# Patient Record
Sex: Male | Born: 1980
Health system: Southern US, Community
[De-identification: ages and names within clinical notes are randomized; demographics above are authoritative.]

## PROBLEM LIST (undated history)

## (undated) ENCOUNTER — Ambulatory Visit (HOSPITAL_COMMUNITY): Admission: EM | Source: Home / Self Care

## (undated) DIAGNOSIS — E119 Type 2 diabetes mellitus without complications: Secondary | ICD-10-CM

## (undated) DIAGNOSIS — E7800 Pure hypercholesterolemia, unspecified: Secondary | ICD-10-CM

## (undated) DIAGNOSIS — E669 Obesity, unspecified: Secondary | ICD-10-CM

## (undated) HISTORY — DX: Type 2 diabetes mellitus without complications: E11.9

## (undated) HISTORY — DX: Obesity, unspecified: E66.9

---
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
## 2006-02-26 ENCOUNTER — Emergency Department (HOSPITAL_COMMUNITY): Admission: EM | Admit: 2006-02-26 | Discharge: 2006-02-27 | Payer: Self-pay | Admitting: Emergency Medicine

## 2006-07-28 ENCOUNTER — Emergency Department (HOSPITAL_COMMUNITY): Admission: EM | Admit: 2006-07-28 | Discharge: 2006-07-28 | Payer: Self-pay | Admitting: Family Medicine

## 2006-08-16 ENCOUNTER — Emergency Department (HOSPITAL_COMMUNITY): Admission: EM | Admit: 2006-08-16 | Discharge: 2006-08-16 | Payer: Self-pay | Admitting: Family Medicine

## 2006-08-27 ENCOUNTER — Ambulatory Visit: Payer: Self-pay | Admitting: Family Medicine

## 2007-01-17 DIAGNOSIS — E669 Obesity, unspecified: Secondary | ICD-10-CM

## 2007-01-17 DIAGNOSIS — I10 Essential (primary) hypertension: Secondary | ICD-10-CM | POA: Insufficient documentation

## 2007-01-17 DIAGNOSIS — G510 Bell's palsy: Secondary | ICD-10-CM

## 2008-12-10 ENCOUNTER — Emergency Department (HOSPITAL_COMMUNITY): Admission: EM | Admit: 2008-12-10 | Discharge: 2008-12-10 | Payer: Self-pay | Admitting: Family Medicine

## 2009-07-04 DIAGNOSIS — E1169 Type 2 diabetes mellitus with other specified complication: Secondary | ICD-10-CM | POA: Insufficient documentation

## 2009-07-05 ENCOUNTER — Encounter: Admission: RE | Admit: 2009-07-05 | Discharge: 2009-07-05 | Payer: Self-pay | Admitting: Family Medicine

## 2009-10-31 ENCOUNTER — Emergency Department (HOSPITAL_COMMUNITY): Admission: EM | Admit: 2009-10-31 | Discharge: 2009-10-31 | Payer: Self-pay | Admitting: Family Medicine

## 2009-11-20 HISTORY — PX: EYE SURGERY: SHX253

## 2010-05-12 ENCOUNTER — Emergency Department (HOSPITAL_COMMUNITY): Admission: EM | Admit: 2010-05-12 | Discharge: 2010-05-12 | Payer: Self-pay | Admitting: Family Medicine

## 2010-11-20 HISTORY — PX: EYE SURGERY: SHX253

## 2011-02-05 LAB — POCT URINALYSIS DIP (DEVICE)
Bilirubin Urine: NEGATIVE
Glucose, UA: 250 mg/dL — AB
Hgb urine dipstick: NEGATIVE
Nitrite: NEGATIVE
Specific Gravity, Urine: 1.025 (ref 1.005–1.030)
pH: 5.5 (ref 5.0–8.0)

## 2011-02-05 LAB — POCT I-STAT, CHEM 8
BUN: 12 mg/dL (ref 6–23)
Chloride: 104 mEq/L (ref 96–112)
Creatinine, Ser: 1 mg/dL (ref 0.4–1.5)
Sodium: 139 mEq/L (ref 135–145)

## 2011-03-29 LAB — LIPID PANEL
Cholesterol: 149 mg/dL (ref 0–200)
HDL: 45 mg/dL (ref 35–70)
LDL Cholesterol: 90 mg/dL

## 2011-12-05 ENCOUNTER — Ambulatory Visit (INDEPENDENT_AMBULATORY_CARE_PROVIDER_SITE_OTHER): Payer: 59 | Admitting: Family Medicine

## 2011-12-05 ENCOUNTER — Encounter: Payer: Self-pay | Admitting: Family Medicine

## 2011-12-05 DIAGNOSIS — D1800 Hemangioma unspecified site: Secondary | ICD-10-CM

## 2011-12-05 DIAGNOSIS — E1169 Type 2 diabetes mellitus with other specified complication: Secondary | ICD-10-CM

## 2011-12-05 LAB — HEMOGLOBIN A1C: Hgb A1c MFr Bld: 11.6 % — AB (ref 4.0–6.0)

## 2012-01-08 ENCOUNTER — Ambulatory Visit (INDEPENDENT_AMBULATORY_CARE_PROVIDER_SITE_OTHER): Payer: 59 | Admitting: Family Medicine

## 2012-01-08 VITALS — BP 127/77 | HR 66 | Temp 98.2°F | Resp 16 | Ht 69.5 in | Wt 260.0 lb

## 2012-01-08 DIAGNOSIS — N529 Male erectile dysfunction, unspecified: Secondary | ICD-10-CM

## 2012-01-08 MED ORDER — SILDENAFIL CITRATE 100 MG PO TABS: 50.0000 mg | ORAL_TABLET | Freq: Every day | ORAL | Status: DC | PRN

## 2012-01-08 NOTE — Progress Notes (Signed)
  Subjective:    Patient ID: Ian Hughes, male    DOB: [birth year], 31 y.o.   MRN: [id number]  HPI 31 yo obese male with hypertension and diabetes (2 years), on metformin, presents with complaint of erectile dysfunction. Trouble for a couple months.  Difficulty is with sustaining erection.  Tried cialis before but didn't work.     Review of Systems Negative except as per HPI     Objective:   Physical Exam  Constitutional: He appears well-developed and well-nourished.  Cardiovascular: Normal rate, regular rhythm, normal heart sounds and intact distal pulses.   No murmur heard. Pulmonary/Chest: Effort normal and breath sounds normal.          Assessment & Plan:  ED - try viagra

## 2012-02-13 ENCOUNTER — Other Ambulatory Visit: Payer: Self-pay | Admitting: Family Medicine

## 2012-03-30 ENCOUNTER — Other Ambulatory Visit: Payer: Self-pay | Admitting: Physician Assistant

## 2012-05-12 ENCOUNTER — Other Ambulatory Visit: Payer: Self-pay | Admitting: Physician Assistant

## 2012-05-30 ENCOUNTER — Ambulatory Visit (INDEPENDENT_AMBULATORY_CARE_PROVIDER_SITE_OTHER): Payer: 59 | Admitting: Family Medicine

## 2012-05-30 ENCOUNTER — Encounter: Payer: Self-pay | Admitting: Family Medicine

## 2012-05-30 ENCOUNTER — Other Ambulatory Visit: Payer: Self-pay | Admitting: Family Medicine

## 2012-05-30 VITALS — BP 129/82 | HR 66 | Temp 98.4°F | Resp 16 | Ht 70.0 in | Wt 263.0 lb

## 2012-05-30 DIAGNOSIS — E669 Obesity, unspecified: Secondary | ICD-10-CM

## 2012-05-30 DIAGNOSIS — IMO0001 Reserved for inherently not codable concepts without codable children: Secondary | ICD-10-CM

## 2012-05-30 LAB — BASIC METABOLIC PANEL
CO2: 27 mEq/L (ref 19–32)
Calcium: 9.3 mg/dL (ref 8.4–10.5)
Creat: 0.82 mg/dL (ref 0.50–1.35)

## 2012-05-30 LAB — LIPID PANEL
Cholesterol: 146 mg/dL (ref 0–200)
HDL: 46 mg/dL (ref >39)

## 2012-05-30 LAB — POCT GLYCOSYLATED HEMOGLOBIN (HGB A1C): Hemoglobin A1C: 9.9

## 2012-05-30 MED ORDER — METFORMIN HCL ER 750 MG PO TB24: ORAL_TABLET | ORAL | Status: DC

## 2012-05-30 NOTE — Progress Notes (Addendum)
  Subjective:    Patient ID: Ian Hughes, male    DOB: 07-15-1981, 31 y.o.   MRN: [id number]  HPI  This 31 y.o. A male has Type II Diabetes and states compliance with medication without side effects  He has been trying to manage nutrition better and is exercising regularly. FSBS= 150- 250. Denies  hypoglycemia, excessive thirst, hunger or nocturia. No problems with foot numbness or itching (rash noted  on feet is due to dry skin).     Review of Systems  Constitutional: Positive for activity change. Negative for appetite change, fatigue and unexpected weight change.  Eyes: Negative for visual disturbance.  Respiratory: Negative for chest tightness and shortness of breath.   Cardiovascular: Negative for chest pain.  Genitourinary: Negative.   Neurological: Negative.        Objective:   Physical Exam  Nursing note and vitals reviewed. Constitutional: He is oriented to person, place, and time. He appears well-developed and well-nourished. No distress.  HENT:  Head: Normocephalic and atraumatic.  Eyes: Conjunctivae and EOM are normal. No scleral icterus.  Cardiovascular: Normal rate, regular rhythm and intact distal pulses.   Pulmonary/Chest: Effort normal. No respiratory distress.  Musculoskeletal: He exhibits no edema and no tenderness.  Neurological: He is alert and oriented to person, place, and time. No cranial nerve deficit.  Skin: Skin is warm and dry.       Feet: dry skin on plantar aspect;  nails are thick (great toes) and discolored     Results for orders placed in visit on 05/30/12  POCT GLYCOSYLATED HEMOGLOBIN (HGB A1C)      Component Value Range   Hemoglobin A1C 9.9       LABS (07/10/11): Cr= 0.89   LFTs- normal            (03/29/11): Lipids-    TChol= 149   TGs= 72   HDL= 45   LDL= 90      Assessment & Plan:   1. Type II or unspecified type diabetes mellitus without mention of complication, uncontrolled - goal A1C= 7 % at next visit  Basic metabolic panel,  Lipid panel  Medication change: Metformin ER 750 mg  2 tablets with breakfast and 1 tablet with evening meal.  Print info re: Meal planning/ portion sizes Continue regular exercise   2. Obesity  Vitamin D, 25-hydroxy Weight loss goal over next 3 months= 10 lbs

## 2012-05-30 NOTE — Patient Instructions (Signed)

## 2012-05-31 LAB — VITAMIN D 25 HYDROXY (VIT D DEFICIENCY, FRACTURES): Vit D, 25-Hydroxy: 24 ng/mL — ABNORMAL LOW (ref 30–89)

## 2012-05-31 NOTE — Progress Notes (Signed)
Quick Note:  Please call pt and advise that the following labs are abnormal... Vitamin D level is a little below normal. Get over-the-counter Vitamin D3 2000 IU and take 1 capsule every day. Try to eat more salmon and tuna, sardines, mushrooms, yogurt and other Vit D fortified products. Need to get 10-15 minutes of sun most days of the week.  Blood sugar is elevated. Take Diabetes medication as directed. Improve your nutrition and work on weight reduction. Lipid profile/ cholesterol numbers are great!  Copy to pt. ______

## 2012-08-30 ENCOUNTER — Encounter: Payer: Self-pay | Admitting: Family Medicine

## 2012-08-30 ENCOUNTER — Ambulatory Visit (INDEPENDENT_AMBULATORY_CARE_PROVIDER_SITE_OTHER): Payer: 59 | Admitting: Family Medicine

## 2012-08-30 VITALS — BP 124/76 | HR 72 | Temp 98.4°F | Resp 16 | Ht 69.5 in | Wt 257.0 lb

## 2012-08-30 DIAGNOSIS — IMO0001 Reserved for inherently not codable concepts without codable children: Secondary | ICD-10-CM

## 2012-08-30 MED ORDER — METFORMIN HCL ER 750 MG PO TB24: ORAL_TABLET | ORAL | Status: DC

## 2012-08-30 NOTE — Progress Notes (Signed)
S: This 31 y.o. AA male has Type II DM (last A1C= 9.9% in July); FSBS: AM- 170 and hs- 200. Pt has been taking Metformin XR 750 mg tablets twice a day (instead of 2 tabs every AM and 1 tab every PM) and admits to missing doses at least twice a week. He does not follow a meal plan. He is aware of some of the complications of uncontrolled DM, namely "problems with hands and feet" and "possibly losing parts of limbs". He also has Vit D def and was advised to get OTC supplement which he has not done.  ROS: Negative for diaphoresis, fever/chills, abnormal weight change, visual disturbances, CP or tightness, SOB, palpitations, numbness, tremors, weakness or syncope. He has chronic nail problems with discoloration/ thickness since childhood.  O:  Filed Vitals:   08/30/12 1530  BP: 124/76  Pulse: 72  Temp: 98.4 F (36.9 C)  Resp: 16   GEN: In NAD; WN,WD. HEENT: [place]/AT; EOMI, conj/scl clear. Nose/Oropharynx normal. COR: RRR. Pulses 2+ in distal lower ext. LUNGS: Normal resp rate and effort. SKIN: Feet- dry scaly patches with thick, discolored toenails. NEURO: A7O x 3; CNs intact. Otherwise,nonfocal.   Results for orders placed in visit on 08/30/12  POCT GLYCOSYLATED HEMOGLOBIN (HGB A1C)      Component Value Range   Hemoglobin A1C 13.3       A/P: 1. Type II or unspecified type diabetes mellitus without mention of complication, uncontrolled   Amb ref to Medical Nutrition Therapy-MNT Pt not ready to start Insulin Medication compliance- Metformin 2 tabs every AM, 1 tab every PM with meals  2. Obesity, Class II, BMI 35-39.9, with comorbidity  Improve nutrition- 1800 cal meal plan given to pt.    Flu vaccine given at [facility].

## 2012-08-30 NOTE — Patient Instructions (Addendum)
1800 Calorie Diet for Diabetes Meal Planning  The 1800 calorie diet is designed for eating up to 1800 calories each day. Following this diet and making healthy meal choices can help improve overall health. This diet controls blood sugar (glucose) levels and can also help lower blood pressure and cholesterol.  SERVING SIZES  Measuring foods and serving sizes helps to make sure you are getting the right amount of food. The list below tells how big or small some common serving sizes are:   1 oz.........4 stacked dice.   3 oz.........Deck of cards.   1 tsp........Tip of little finger.   1 tbs........Thumb.   2 tbs........Golf ball.    cup.......Half of a fist.   1 cup........A fist.  GUIDELINES FOR CHOOSING FOODS  The goal of this diet is to eat a variety of foods and limit calories to 1800 each day. This can be done by choosing foods that are low in calories and fat. The diet also suggests eating small amounts of food frequently. Doing this helps control your blood glucose levels so they do not get too high or too low. Each meal or snack may include a protein food source to help you feel more satisfied and to stabilize your blood glucose. Try to eat about the same amount of food around the same time each day. This includes weekend days, travel days, and days off work. Space your meals about 4 to 5 hours apart and add a snack between them if you wish.   For example, a daily food plan could include breakfast, a morning snack, lunch, dinner, and an evening snack. Healthy meals and snacks include whole grains, vegetables, fruits, lean meats, poultry, fish, and dairy products. As you plan your meals, select a variety of foods. Choose from the bread and starch, vegetable, fruit, dairy, and meat/protein groups. Examples of foods from each group and their suggested serving sizes are listed below. Use measuring cups and spoons to become familiar with what a healthy portion looks like.  Bread and Starch   Each serving equals 15 grams of carbohydrates.   1 slice bread.    bagel.    cup cold cereal (unsweetened).    cup hot cereal or mashed potatoes.   1 small potato (size of a computer mouse).    cup cooked pasta or rice.    English muffin.   1 cup broth-based soup.   3 cups of popcorn.   4 to 6 whole-wheat crackers.    cup cooked beans, peas, or corn.  Vegetable  Each serving equals 5 grams of carbohydrates.    cup cooked vegetables.   1 cup raw vegetables.    cup tomato or vegetable juice.  Fruit  Each serving equals 15 grams of carbohydrates.   1 small apple or orange.   1 cup watermelon or strawberries.    cup applesauce (no sugar added).   2 tbs raisins.    banana.    cup canned fruit, packed in water, its own juice, or sweetened with a sugar substitute.    cup unsweetened fruit juice.  Dairy  Each serving equals 12 to 15 grams of carbohydrates.   1 cup fat-free milk.   6 oz artificially sweetened yogurt or plain yogurt.   1 cup low-fat buttermilk.   1 cup soy milk.   1 cup almond milk.  Meat/Protein   1 large egg.   2 to 3 oz meat, poultry, or fish.    cup low-fat cottage cheese.     1 tbs peanut butter.   1 oz low-fat cheese.    cup tuna in water.    cup tofu.  Fat   1 tsp oil.   1 tsp trans-fat-free margarine.   1 tsp butter.   1 tsp mayonnaise.   2 tbs avocado.   1 tbs salad dressing.   1 tbs cream cheese.   2 tbs sour cream.  SAMPLE 1800 CALORIE DIET PLAN  Breakfast    cup unsweetened cereal (1 carb serving).   1 cup fat-free milk (1 carb serving).   1 slice whole-wheat toast (1 carb serving).    small banana (1 carb serving).   1 scrambled egg.   1 tsp trans-fat-free margarine.  Lunch   Tuna sandwich.   2 slices whole-wheat bread (2 carb servings).    cup canned tuna in water, drained.   1 tbs reduced fat mayonnaise.   1 stalk celery, chopped.   2 slices tomato.   1 lettuce leaf.   1 cup carrot sticks.    24 to 30 seedless grapes (2 carb servings).   6 oz light yogurt (1 carb serving).  Afternoon Snack   3 graham cracker squares (1 carb serving).   Fat-free milk, 1 cup (1 carb serving).   1 tbs peanut butter.  Dinner   3 oz salmon, broiled with 1 tsp oil.   1 cup mashed potatoes (2 carb servings) with 1 tsp trans-fat-free margarine.   1 cup fresh or frozen green beans.   1 cup steamed asparagus.   1 cup fat-free milk (1 carb serving).  Evening Snack   3 cups air-popped popcorn (1 carb serving).   2 tbs parmesan cheese sprinkled on top.  MEAL PLAN  Use this worksheet to help you make a daily meal plan based on the 1800 calorie diet suggestions. If you are using this plan to help you control your blood glucose, you may interchange carbohydrate-containing foods (dairy, starches, and fruits). Select a variety of fresh foods of varying colors and flavors. The total amount of carbohydrate in your meals or snacks is more important than making sure you include all of the food groups every time you eat. Choose from the following foods to build your day's meals:   8 Starches.   4 Vegetables.   3 Fruits.   2 Dairy.   6 to 7 oz Meat/Protein.   Up to 4 Fats.  Your dietician can use this worksheet to help you decide how many servings and which types of foods are right for you.  BREAKFAST  Food Group and Servings / Food Choice  Starch ________________________________________________________  Dairy _________________________________________________________  Fruit _________________________________________________________  Meat/Protein __________________________________________________  Fat ___________________________________________________________  LUNCH  Food Group and Servings / Food Choice  Starch ________________________________________________________  Meat/Protein __________________________________________________  Vegetable _____________________________________________________   Fruit _________________________________________________________  Dairy _________________________________________________________  Fat ___________________________________________________________  AFTERNOON SNACK  Food Group and Servings / Food Choice  Starch ________________________________________________________  Meat/Protein __________________________________________________  Fruit __________________________________________________________  Dairy _________________________________________________________  DINNER  Food Group and Servings / Food Choice  Starch _________________________________________________________  Meat/Protein ___________________________________________________  Dairy __________________________________________________________  Vegetable ______________________________________________________  Fruit ___________________________________________________________  Fat ____________________________________________________________  EVENING SNACK  Food Group and Servings / Food Choice  Fruit __________________________________________________________  Meat/Protein ___________________________________________________  Dairy __________________________________________________________  Starch _________________________________________________________  DAILY TOTALS  Starch ____________________________  Vegetable _________________________  Fruit _____________________________  Dairy _____________________________  Meat/Protein______________________  Fat _______________________________  Document Released: 05/29/2005 Document Revised: 01/29/2012 Document Reviewed: 09/22/2011  [employer] Patient Information 2013   [employer]

## 2012-09-24 ENCOUNTER — Ambulatory Visit (INDEPENDENT_AMBULATORY_CARE_PROVIDER_SITE_OTHER): Payer: 59 | Admitting: Family Medicine

## 2012-09-24 ENCOUNTER — Encounter: Payer: Self-pay | Admitting: Family Medicine

## 2012-09-24 VITALS — BP 125/70 | HR 73 | Temp 98.2°F | Resp 16 | Ht 70.0 in | Wt 256.0 lb

## 2012-09-24 DIAGNOSIS — N529 Male erectile dysfunction, unspecified: Secondary | ICD-10-CM

## 2012-09-24 MED ORDER — VARDENAFIL HCL 10 MG PO TABS: 10.0000 mg | ORAL_TABLET | Freq: Every day | ORAL | Status: DC | PRN

## 2012-09-24 MED ORDER — SITAGLIPTIN PHOSPHATE 100 MG PO TABS: 100.0000 mg | ORAL_TABLET | Freq: Every day | ORAL | Status: DC

## 2012-09-24 NOTE — Patient Instructions (Addendum)
Erectile Dysfunction Erectile dysfunction (ED) is the inability to get a good enough erection to have sexual intercourse. ED may involve:  Inability to get an erection.  Lack of enough hardness to allow penetration.  Loss of the erection before sex is finished.  Premature ejaculation.  Any combination of these problems if they occur more than 25% of the time. CAUSES  Certain drugs, such as:  Pain relievers.  Antihistamines.  Antidepressants.  Blood pressure medicines.  Water pills.  Ulcer medicines.  Muscle relaxants.  Illegal drugs.  Excessive drinking.  Psychological causes, such as:  Anxiety.  Depression.  Sadness.  Exhaustion.  Performance fear.  Stress.  Physical causes, such as:  Artery problems. This may include diabetes, smoking, liver disease, or atherosclerosis.  High blood pressure.  Hormonal problems, such as low testosterone.  Obesity.  Nerve problems. This may include back or pelvic injuries, diabetes, multiple sclerosis, Parkinson's disease, or some surgeries. SYMPTOMS  Inability to get an erection.  Lack of enough hardness to allow penetration.  Loss of the erection before sex is finished.  Premature ejaculation.  Normal erections at some times, but with frequent unsatisfactory episodes.  Orgasms that are not satisfactory in sensation or frequency.  Low sexual satisfaction in either partner because of erection problems.  A curved penis occurring with erection. The curve may cause pain or may be too curved to allow for intercourse.  Never having nighttime erections. DIAGNOSIS Your caregiver can often diagnose this condition by:  Performing a physical exam to find other diseases or specific problems with the penis.  Asking you detailed questions about the problem.  Performing blood tests to check for diabetes or to measure hormone levels.  Performing urine tests to find other underlying health  conditions.  Performing an ultrasound to check for scarring.  Performing a test to check blood flow to the penis.  Doing a sleep study at home to measure nighttime erections. TREATMENT   You may be prescribed medicines by mouth.  You may be given medicine injections into the penis.  You may be prescribed a vacuum pump with a ring.  Penile implant surgery may be performed. You may receive:  An inflatable implant.  A semi-rigid implant.  Blood vessel surgery may be performed. HOME CARE INSTRUCTIONS  Take all medicine as directed by your caregiver. Do not take any other medicines without talking to your caregiver first.  Follow your caregiver's directions for specific treatments as prescribed.  Follow up with your caregiver as directed. Document Released: 11/03/2000 Document Revised: 01/29/2012 Document Reviewed: 02/26/2011 [facility] Patient Information 2013 [place], [place].    Sexual and Urologic Problems of Diabetes Bladder problems and changes in sexual function are common as we age. Diabetes can cause these problems to start at an earlier age and can make these problems worse. By keeping your diabetes under control, you can lower your risk of sexual and urologic problems. CAUSES  Sexual and urologic problems of diabetes are caused by many factors. These factors can be related to nerve damage. SYMPTOMS  Difficulty with erections or ejaculation in men. Symptoms include:  Inability to have an erection firm enough for sexual intercourse.  Retrograde ejaculation. This is when part or all of a man's semen goes into the bladder instead of out the penis during ejaculation. He may notice that little semen is discharged during ejaculation or may become aware of the condition if fertility problems arise. Urine may appear cloudy. Problems with sexual response and vaginal lubrication in women. Symptoms  can include:  Decreased or total lack of interest in sex.  Decreased or no  sensation in the genital area.  Constant or occasional inability to reach orgasm.  Dryness in the vaginal area. This can lead to pain or discomfort during sex. Urologic problems.   Symptoms of an overactive bladder include:  Urinary urgency and frequency.  Getting up at night to urinate often.  Urine leakage.  The symptoms of a neurogenic bladder are less common, and may be more severe. These include:  Difficulty urinating.  Urinary tract infections.  Loss of the urge to urinate when the bladder is full.  Urine leakage.  Complete failure to empty your bladder (retention). Urinary tract infection symptoms:  Frequent urge to urinate.  Pain or burning in the bladder or urethra during urination.  Cloudy or reddish urine.  Fatigue or shakiness.  In women, pressure above the pubic bone.  In men, a feeling of fullness in the rectum.  If the infection is in your kidneys, you may feel sick to your stomach, feel pain in your back or side, and have a fever. You may notice shaking and chills. DIAGNOSIS  Erectile Dysfunction.   Your caregiver may ask you about your medical history, the type and frequency of your sexual problems, your medications, your smoking and drinking habits, and other health conditions.  A physical exam and lab tests may be done.  Your blood glucose control and hormone levels will be checked.  Your caregiver may also ask you whether you are depressed or have recently experienced upsetting changes in your life.  You may be asked to do a home test that checks for erections that occur during sleep. It is very common for men to have nocturnal erections and not be aware. Retrograde Ejaculation.   Your caregiver may take your medical history, do a physical exam, and examine your urine. Retrograde ejaculation is very common in men who have had prostate surgery. Tell your caregiver if you have had prostate surgery. Decreased Vaginal Lubrication or Absent  Sexual Response.  Your caregiver will ask you about your medical history, any gynecologic conditions or infections, the type and frequency of your sexual problems, your medications, your smoking and drinking habits, and other health conditions.  A physical exam and lab tests may be done.  Your blood glucose control will be discussed.  Your caregiver may ask whether you might be pregnant or have reached menopause.  They may also ask whether you are depressed or have recently experienced upsetting changes in your life. Bladder Dysfunction.  Your caregiver will check both your nervous system (your brain and the nerves of the bladder) and the bladder itself.  Tests may include x-rays and an evaluation of bladder function (urodynamics). Urinary Tract Infections.  Your caregiver will ask for a urine sample. This sample will be analyzed for bacteria and pus. If you have frequent urinary tract infections, your caregiver may order further tests.  An ultrasound exam may be done.  An intravenous pyelogram (IVP) may be done in order to examine images of your urinary tract.  A cystoscopy, which allows your caregiver to view the inside of the bladder, may be done. TREATMENT  Erectile Dysfunction.  Talk to your caregiver if you experience erectile dysfunction.  Treatments for erectile dysfunction caused by nerve damage (neuropathy) vary widely and can include:  Oral pills.  A vacuum pump.  Pellets placed in the urethra.  Shots directly into the penis.  Surgery to implant a device to  aid in erection or to repair arteries.  Psychotherapy to reduce anxiety or address other issues may be necessary. Retrograde Ejaculation.  Medicine may be given that improves the muscle tone of the bladder neck.  A urologist experienced in infertility treatments may help to promote fertility. This may include collecting sperm from the urine and then using the sperm for artificial insemination. Decreased  Vaginal Lubrication or Absent Sexual Response.  If you experience sexual problems or notice a change in your sexual response, talk to your caregiver.  Prescription or over-the-counter vaginal lubricant creams may be prescribed or recommended for dryness.  Techniques to treat decreased sexual response include changes in position and stimulation during sexual relations.  Psychological counseling may be helpful. Bladder Dysfunction.  Treatment for neurogenic bladder depends on the specific problem and its cause.  If the main problem is retention of urine in the bladder, treatment may involve medicine to promote better bladder emptying and behavior changes to promote more efficient urination (timed urination).  Sometimes, people may need to periodically insert a thin tube (catheter) through the urethra into the bladder to drain the urine.  Learning how to tell when the bladder is full and how to massage the lower abdomen to fully empty the bladder can help.  If urinary leakage is the main problem, medicine or surgery can help.  Kegel exercises can strengthen the muscles that hold urine in the bladder. Urinary Tract Infection.  Early diagnosis and treatment are important to prevent more serious infections (like a kidney infection).  Your caregiver may prescribe medicine that kills germs (antibiotic) based on the bacteria in your urine. Current recommendations are for a full 7-day course of antibiotic treatment in people with diabetes.  Kidney infections are more serious and may require several weeks of antibiotic treatment.  Drinking plenty of fluids will help prevent another infection.  If infections are associated with sexual intercourse, it may require prophylactic medicince prior to intercourse. RISK FACTORS Risk factors are conditions that increase your chances of getting a particular disease. The more risk factors you have, the greater your chances of developing that disease or  condition. Diabetic neuropathy, including related sexual and urologic problems, appears to be more common in people who:  Have poor blood glucose control.  Have high levels of blood cholesterol.  Have high blood pressure.  Are overweight.  Are over the age of 78.  Smoke. PREVENTION   Keep your blood glucose, blood pressure, and cholesterol close to the target numbers your caregiver recommends.  Be physically active and maintain a healthy weight. This can also help prevent the long-term complications of diabetes.  Quit smoking. This will improve your health in many ways. If you quit smoking, you can lower your risk not only for nerve damage but also for heart attack, stroke, and kidney disease. FOR MORE INFORMATION  American Diabetes Association: www.diabetes.org Juvenile [employer]: www.jdrf.org Document Released: 02/22/2009 Document Revised: 01/29/2012 Document Reviewed: 02/26/2009 [facility] Patient Information 2013 [place], [place].

## 2012-09-24 NOTE — Progress Notes (Signed)
S:  This 31 y.o. AA male is here with his wife Ian Hughes) for discussion of E.D. This has been problematic for > 1 year. He was seen in Feb 2013 by Dr. Georgiana Shore who prescribed Viagra 100 mg; this was ineffective for Ian Hughes (he took 100 mg twice within a   6-hour window on one occasion).  Re: DM- Ian Hughes is scheduled to see Nutritionist later this month. His wife reports Ian Hughes has problems with portion sizes and excessive fruit consumption. Attempts at counseling him have been futile.  O:  Filed Vitals:   09/24/12 1515  BP: 125/70  Pulse: 73  Temp: 98.2 F (36.8 C)  Resp: 16   GEN: In NAD; WN,WD.    Weight down 7 lbs since July. HEENT: [place]/AT; unremarkable. COR: RRR. LUNGS: Normal resp rate and effort. NEURO: A&O x 3; CNs intact. Nonfocal.  Hemoglobin A1c= 13.3 % in October   A/P:  1. ED (erectile dysfunction)    Trial Levitra 10 mg tablets  1 tablet daily for ED. Explained to Ian Hughes that etiology of problem lies in poor glycemic control.  2. Type II or unspecified type diabetes mellitus without mention of complication, uncontrolled  Continue Metformin RX:  Januvia 100 mg  1 tablet daily  (wife requests addition of oral agent prior to starting Insulin) Keep appt for DM and nutrition counseling.   Wife requests Ian Hughes have prostate exam before age 87 because of family hx of cancer (father and sister just died at age 42 of ovarian cancer).

## 2012-10-02 ENCOUNTER — Other Ambulatory Visit: Payer: Self-pay | Admitting: *Deleted

## 2012-10-02 MED ORDER — TADALAFIL 10 MG PO TABS: 10.0000 mg | ORAL_TABLET | Freq: Every day | ORAL | Status: DC | PRN

## 2012-10-08 ENCOUNTER — Encounter: Payer: 59 | Attending: Family Medicine | Admitting: Dietician

## 2012-10-08 ENCOUNTER — Encounter: Payer: Self-pay | Admitting: Dietician

## 2012-10-08 VITALS — Ht 69.5 in | Wt 260.7 lb

## 2012-10-08 DIAGNOSIS — E669 Obesity, unspecified: Secondary | ICD-10-CM | POA: Insufficient documentation

## 2012-10-08 DIAGNOSIS — Z713 Dietary counseling and surveillance: Secondary | ICD-10-CM | POA: Insufficient documentation

## 2012-10-08 DIAGNOSIS — E119 Type 2 diabetes mellitus without complications: Secondary | ICD-10-CM | POA: Insufficient documentation

## 2012-10-08 DIAGNOSIS — E1169 Type 2 diabetes mellitus with other specified complication: Secondary | ICD-10-CM

## 2012-10-08 NOTE — Patient Instructions (Addendum)
   Check blood glucose at other times during the day.   Aim for the 2 hours after meals and at bedtime.  Add nuts or cheese (string chees) with the fruit in the morning.  Try to get the 150 minutes of walking or other exercise in each week  Aim for 45-60 carb grams/bucks at each meal and for snacks, aim for 15 gms.    Aim for regular meals and snacks.

## 2012-10-08 NOTE — Progress Notes (Signed)
  Medical Nutrition Therapy:  Appt start time: 1630 end time:  1730.   Assessment:  Primary concerns today: Wants to talk about "meals and portions that I take in."  Since his last MD visit on 08/30/2012, he has gained 3.7 lb.  Current BMI is at 38 kg/m2.  His A1C at that last MD visit in October was 13.3%.  He was recently encouraged to join [facility] and he is going to go to HR and see what he will need to do to become eligible for the benefits of belong to the [employer].    HYPERGLYCEMIA:  Notes that with increased glucose, he has increased thirst and goes to the BR more often.  HYPOGLYCEMIA:  Denies andy of the S/S of low blood glucose at this time.   MEDICATIONS: Medication review completed.   DIETARY INTAKE:   24-hr recall:  B ( AM): Not always able to eat.  9:00 water, maybe a banana or an apple.  Snk ( AM): None  L ( PM): 11:30 if he takes his break.  Tuna salad sandwich, 2 whole wheat breads. Water.   Tuna or chick salad or deli sandwich. Snk ( PM): 2:45 sack of pretzels or tortilla chips, couple of handfuls. With water.   D ( PM): 7:30-8:00 baked chicken 2 legs.  mac and cheese  1 cup and green beans 2-3 cups. Snk ( PM): vegetable and water. Beverages: water  Usual physical activity: Currently working full time and does not have a scheduled exercise regime.  Review of the facilities available with the [facility].  Estimated energy needs:   HT:69.5 in  WT: 260.7 lb  BMI: 38  Adj WT:  200 lb  (91 kg) 1800 calories 200-205 g carbohydrates 130-135 g protein 48-50 g fat  Progress Towards Goal(s):  No progress.   Nutritional Diagnosis:  [place]-2.1 Inpaired nutrition utilization As related to blood glucose.  As evidenced by diagnosis of type 2 diabetes with A1C at 13.3%, and Metformin to assist with glucose control.    Intervention:  Nutrition Review of the food groups and the influence of each on blood glucose levels.  Review of the carb foods, reading food labels  and the need to restrict carbs to enhance blood glucose control.  Review of the need for regular meals and/or snacks as well as the need to evenly distribute his carb intake throughout the day.  Review of the effects of physical activity on blood glucose levels.  Handouts given during visit include:  Living Well with Diabetes  Controlling Blood Glucose  45 and 60 gm Menu suggestions.  Snack list  List of Non-starchy Vegetables.  Monitoring/Evaluation:  Dietary intake, exercise, blood glucose levels, and body weight in 12 weeks.Marland Kitchen

## 2012-10-20 ENCOUNTER — Encounter: Payer: Self-pay | Admitting: Dietician

## 2012-11-27 ENCOUNTER — Encounter: Payer: 59 | Attending: Family Medicine | Admitting: Dietician

## 2012-11-27 VITALS — Ht 69.5 in | Wt 261.4 lb

## 2012-11-27 DIAGNOSIS — E119 Type 2 diabetes mellitus without complications: Secondary | ICD-10-CM | POA: Insufficient documentation

## 2012-11-27 DIAGNOSIS — Z713 Dietary counseling and surveillance: Secondary | ICD-10-CM | POA: Insufficient documentation

## 2012-11-27 DIAGNOSIS — E669 Obesity, unspecified: Secondary | ICD-10-CM | POA: Insufficient documentation

## 2012-11-27 DIAGNOSIS — E1169 Type 2 diabetes mellitus with other specified complication: Secondary | ICD-10-CM

## 2012-11-27 NOTE — Progress Notes (Signed)
  Medical Nutrition Therapy:  Appt start time: 1730 end time:  1800.  Assessment:  Primary concerns today: Follow-up for diabetes.  Concerned today that his blood glucose is still high at times in the morning and he is not losing weight.  Ask about "the shots that are not insulin but will help your glucose and help you lose weight."  Since his last visit on 10/08/2012, he has gained about 1 lb. Reviewed his current treatment plan and noted that he has not used the exercise component in his treatment plan.  Recommended he try to add something in the form of activity/exercise.  Reviewed the use of Byetta and Victoza for glucose control and the side effect of weight loss.  He further reports that his wife is supportive and is buying only healthy foods. She is insisting that he and the whole family eat a more healthy diet. Recommended that her follow-up with the [facility] program for support in caring for his diabetes and in getting his medications and strips through the out-patient pharmacy at a reduced rate. He is due back to his MD later in January for his check-up and A1C level.    BLOOD GLUCOSE MONITORING:  Fasting (4:30 AM: 134, 176, 170's.  Recommended that he check at other times during the day   MEDICATIONS: Metformin 750 mg daily for glucose control.  DIETARY INTAKE:  24-hr recall:  B (early  AM): piece of fruit banana or apple. Day off: eggs, grits 1 cup  Sausage 2 links or bacon,  Snk (mid AM) :none  L (11:30 PM): [place] sandwich on wheat, water. Snack on fruit 1/2 banana Usually wheat Ronnie [place] crackers.use alon  Snk (3:00 PM): 1/2 banana and [place] sandwich. D (7;00 PM): baked spaghetti and veggie blend with veggie blend.  Weekends much the same.  Snk (HS/ PM): none Beverages: water, rare soda  Recent physical activity: Started to workout for about 2 weeks, but found it boring and difficult to work into his schedule.  Recommended that he return to playing basketball with  his friends as he has done in the past.  Recommended taking his son with him when might practice shooting hoops during the week and join the guys for a game on Saturday.  This would get him the exercise and maybe fun.  Estimated energy needs: HT: 69.5in  WT: 261.4 lb  BMI: 37.9 kg/m2  Adj WT: 200 lb (91 Kg) 1800 calories 200 g carbohydrates 130-135 g protein 48-50 g fat  Progress Towards Goal(s):  No progress.   Nutritional Diagnosis:  [place]-2.1 Inpaired nutrition utilization As related to blood glucose.  As evidenced by diagnosis of type2 diabeteswith an A1C at 13.3% previously, and Metformin to assist with glucose control.    Intervention:  Nutrition Look to continue to monitor labels, portions and aim to limit the portions of carbohydrate at each meal to 60 gms.  Encouraged to call Cranford Mon at [employer] for assistance in enrolling in the program.  His recall is such that he should be losing some weight.  I'm concerned that there may some stress that is causing a great deal of glucose spilling from the liver.  Encouraged to add exercise to his treatment regimen.  Handouts given during visit include:  Number for Diabetes care coordinator at [employer]  Monitoring/Evaluation:  Dietary intake, exercise, blood glucose levels, and body weight in 6-8 weeks.  Call for an appointment.Marland Kitchen

## 2012-11-27 NOTE — Patient Instructions (Addendum)
   Get to the basketball court to practice your shooting.  Plan to join the guys on Saturday morning for a workout.  The Byetta will have more nausea than the Victoza.   Call Cranford Mon RN Coordinator for [facility]  [phone]) to lean how to get in the employee health program

## 2012-11-28 ENCOUNTER — Encounter: Payer: Self-pay | Admitting: Dietician

## 2012-12-24 ENCOUNTER — Encounter: Payer: Self-pay | Admitting: Emergency Medicine

## 2012-12-24 ENCOUNTER — Ambulatory Visit (INDEPENDENT_AMBULATORY_CARE_PROVIDER_SITE_OTHER): Payer: 59 | Admitting: Emergency Medicine

## 2012-12-24 VITALS — BP 123/83 | HR 71 | Temp 98.3°F | Resp 18 | Wt 257.0 lb

## 2012-12-24 DIAGNOSIS — E119 Type 2 diabetes mellitus without complications: Secondary | ICD-10-CM

## 2012-12-24 LAB — POCT GLYCOSYLATED HEMOGLOBIN (HGB A1C): Hemoglobin A1C: 12.9

## 2012-12-24 NOTE — Progress Notes (Signed)
  Subjective:    Patient ID: Ian Hughes, male    DOB: 1981-04-19, 32 y.o.   MRN: [id number]  HPI patient enters for followup of his diabetes. He as been seeing a nutritionist. He has not been exercising. He states he's been taking his medications regular.    Review of Systems     Objective:   Physical Exam patient is alert cooperative no distress. Neck is supple. Chest is clear to auscultation and percussion. Heart regular rate without murmurs. Extremity exam reveals normal sensation.  Results for orders placed in visit on 12/24/12  GLUCOSE, POCT (MANUAL RESULT ENTRY)      Component Value Range   POC Glucose 259 (*) 70 - 99 mg/dl  POCT GLYCOSYLATED HEMOGLOBIN (HGB A1C)      Component Value Range   Hemoglobin A1C 12.9          Results for orders placed in visit on 12/24/12  GLUCOSE, POCT (MANUAL RESULT ENTRY)      Component Value Range   POC Glucose 259 (*) 70 - 99 mg/dl   Assessment & Plan:  Patient has a hemoglobin A1c of 12.9. He is on Januvia and metformin. Referral made to Dr. Talmage Nap for help

## 2013-03-04 ENCOUNTER — Other Ambulatory Visit: Payer: Self-pay | Admitting: Family Medicine

## 2013-05-29 ENCOUNTER — Other Ambulatory Visit: Payer: Self-pay | Admitting: Physician Assistant

## 2013-07-14 ENCOUNTER — Other Ambulatory Visit: Payer: Self-pay

## 2013-07-14 MED ORDER — SITAGLIPTIN PHOSPHATE 100 MG PO TABS: 100.0000 mg | ORAL_TABLET | Freq: Every day | ORAL | Status: DC

## 2013-07-22 ENCOUNTER — Other Ambulatory Visit: Payer: Self-pay | Admitting: Physician Assistant

## 2013-08-21 ENCOUNTER — Ambulatory Visit (INDEPENDENT_AMBULATORY_CARE_PROVIDER_SITE_OTHER): Payer: 59 | Admitting: Family Medicine

## 2013-08-21 ENCOUNTER — Encounter: Payer: Self-pay | Admitting: Family Medicine

## 2013-08-21 VITALS — BP 127/77 | HR 72 | Temp 98.5°F | Resp 16 | Ht 70.5 in | Wt 268.0 lb

## 2013-08-21 DIAGNOSIS — E559 Vitamin D deficiency, unspecified: Secondary | ICD-10-CM

## 2013-08-21 DIAGNOSIS — Z9119 Patient's noncompliance with other medical treatment and regimen: Secondary | ICD-10-CM

## 2013-08-21 DIAGNOSIS — Z91199 Patient's noncompliance with other medical treatment and regimen due to unspecified reason: Secondary | ICD-10-CM

## 2013-08-21 DIAGNOSIS — E669 Obesity, unspecified: Secondary | ICD-10-CM

## 2013-08-21 DIAGNOSIS — IMO0001 Reserved for inherently not codable concepts without codable children: Secondary | ICD-10-CM

## 2013-08-21 LAB — COMPREHENSIVE METABOLIC PANEL
AST: 16 U/L (ref 0–37)
Alkaline Phosphatase: 113 U/L (ref 39–117)
BUN: 12 mg/dL (ref 6–23)
Glucose, Bld: 289 mg/dL — ABNORMAL HIGH (ref 70–99)
Sodium: 138 mEq/L (ref 135–145)
Total Bilirubin: 0.5 mg/dL (ref 0.3–1.2)
Total Protein: 6.4 g/dL (ref 6.0–8.3)

## 2013-08-21 LAB — POCT GLYCOSYLATED HEMOGLOBIN (HGB A1C): Hemoglobin A1C: 11.2

## 2013-08-21 MED ORDER — METFORMIN HCL ER 750 MG PO TB24: ORAL_TABLET | ORAL | Status: DC

## 2013-08-21 MED ORDER — SILDENAFIL CITRATE 100 MG PO TABS: 50.0000 mg | ORAL_TABLET | Freq: Every day | ORAL | Status: DC | PRN

## 2013-08-21 MED ORDER — GLIMEPIRIDE 2 MG PO TABS: ORAL_TABLET | ORAL | Status: DC

## 2013-08-21 MED ORDER — SITAGLIPTIN PHOSPHATE 100 MG PO TABS: 100.0000 mg | ORAL_TABLET | Freq: Every day | ORAL | Status: DC

## 2013-08-21 NOTE — Patient Instructions (Signed)
It is extremely important that we get your Diabetes under control. You are at risk of developing kidney disease and cardiovascular disease if your blood sugars continue to run high. Our goal A1c is 7% You can achieve this by taking your medications as prescribed, staying well hydrated (mostly with water) and making healthy food choices.  I have refilled all your medications. Right now, you are still taking only oral medications. If your A1c is not improved at your next visit, we will be discussing Insulin to control your Diabetes.  I will see you again in 3 months.  Be sure to get your Flu vaccine at work.

## 2013-08-22 LAB — MICROALBUMIN, URINE: Microalb, Ur: 1.16 mg/dL (ref 0.00–1.89)

## 2013-08-22 LAB — T3, FREE: T3, Free: 3.7 pg/mL (ref 2.3–4.2)

## 2013-08-22 LAB — VITAMIN D 25 HYDROXY (VIT D DEFICIENCY, FRACTURES): Vit D, 25-Hydroxy: 36 ng/mL (ref 30–89)

## 2013-08-22 NOTE — Progress Notes (Signed)
S:  Ian 32 y.o. AA Hughes is here for Type II DM follow-up and medication refills. He checks FSBS once a week- cannot recall specific values but tends to be around 200. Noncompliance with medication due to taking on a 2nd job; misses doses of Metformin and not following a meal plan. He was referred to Dr. Talmage Nap by Dr. Cleta Alberts and saw that Endocrine specialist once; pt states no changes were made in medication regimen. He has been w/o 2 of his meds. He denies excessive thirst or polyuria, fatigue, diaphoresis or weakness.  Pt has Vit D def (= 34) diagnosed July of 2013; states he is taking a supplement. Diet not rich in Vit D foods.  Obesity- pt has gained 11 pounds since Feb 2014. As noted above, to following a meal plan and missing doses of Metformin.  HTN- controlled; pt not on medication currently. He denies CP or tightness, palpitations, edema, SOB or DOE, HA, dizziness, numbness or syncope.  Patient Active Problem List   Diagnosis Date Noted  . Diabetes mellitus type 2 in obese 07/04/2009    Priority: High    Class: Chronic  . ED (erectile dysfunction) 09/24/2012  . OBESITY, NOS 01/17/2007  . BELLS PALSY 01/17/2007  . HYPERTENSION, BENIGN SYSTEMIC 01/17/2007   PMHx, Soc Hx and Fam Hx reviewed. Medications reconciled.  ROS: As per HPI.  O: Filed Vitals:   08/21/13 1557  BP: 127/77  Pulse: 72  Temp: 98.5 F (36.9 C)  Resp: 16   GEN: In NAD; WN,WD HENT: [place]/AT; EOMI w/ clear conj/sclerae. EACs/nose/oroph unremarkable. COR: RRR; no edema. LUNGS: Normal resp rate and effort. SKIN: W&D; intact w/o erythema, rashes or lesions. MS: MAEs; no deformities. NEURO: A&O x 3; CNs intact. Nonfocal.  A1c= 11.2%  A/P: Type II or unspecified type diabetes mellitus without mention of complication, uncontrolled - Continue Metformin w/o change, Sitagliptin w/o change and increase Glimepiride to 2 gm 1 tablet twice a day. Nutrition modification needed. Advised vision evaluation by eye care  professional. Plan: POCT glycosylated hemoglobin (Hb A1C), T3, free, TSH, Comprehensive metabolic panel, LDL cholesterol, direct, Microalbumin, urine  Unspecified vitamin D deficiency - Advised continue OTC Vit D3 2000 IU daily pending labs. Plan: Vit D  25 hydroxy   Obesity- Improve nutrition and include physical activity outside of work.  Noncompliance with diabetes treatment- Emphasized medication compliance and FSBS monitoring at home. Pt to bring glucometer to next visit.  Meds ordered Ian encounter  Medications  . DISCONTD: glimepiride (AMARYL) 2 MG tablet    Sig: Take 2 mg by mouth daily before breakfast.  . glimepiride (AMARYL) 2 MG tablet    Sig: Take 1 tablet twice a day before meals.    Dispense:  60 tablet    Refill:  3  . metFORMIN (GLUCOPHAGE-XR) 750 MG 24 hr tablet    Sig: Take 2 tablets by mouth with breakfast and 1 tablet with evening meal.    Dispense:  90 tablet    Refill:  1  . sitaGLIPtin (JANUVIA) 100 MG tablet    Sig: Take 1 tablet (100 mg total) by mouth daily.    Dispense:  30 tablet    Refill:  3  . sildenafil (VIAGRA) 100 MG tablet    Sig: Take 0.5-1 tablets (50-100 mg total) by mouth daily as needed for erectile dysfunction.    Dispense:  10 tablet    Refill:  1

## 2013-08-22 NOTE — Progress Notes (Signed)
Quick Note:  Please advise pt regarding following labs... Vitamin D level is still low; take the Vitamin D3 2000 IU supplement (available without a prescription). If you are taking 1000 IU daily, then double it.  Your metabolic panel is normal except for high blood sugar (as expected). Thyroid tests are normal. Urine test for microalbumin (protein in urine) is normal; kidney function is very good.  LDL ("bad") cholesterol is above desired value for Diabetics; it needs to be below 80. Getting your Diabetes under control will improve this number as well as healthy food choices. The Mediterranean Diet is an excellent guide; you can find information about this online. Some of these abs will be rechecked at your next visit in January 2015.  TAKE ALL MEDICATIONS AS PRESCRIBED!!!  Copy to pt. ______

## 2013-11-06 ENCOUNTER — Other Ambulatory Visit: Payer: Self-pay | Admitting: Family Medicine

## 2013-11-28 ENCOUNTER — Ambulatory Visit: Payer: 59 | Admitting: Family Medicine

## 2013-12-03 ENCOUNTER — Ambulatory Visit (INDEPENDENT_AMBULATORY_CARE_PROVIDER_SITE_OTHER): Payer: 59 | Admitting: Family Medicine

## 2013-12-03 ENCOUNTER — Encounter: Payer: Self-pay | Admitting: Family Medicine

## 2013-12-03 VITALS — BP 144/100 | HR 75 | Temp 99.1°F | Resp 16 | Ht 70.75 in | Wt 237.4 lb

## 2013-12-03 DIAGNOSIS — IMO0001 Reserved for inherently not codable concepts without codable children: Secondary | ICD-10-CM

## 2013-12-03 DIAGNOSIS — E1165 Type 2 diabetes mellitus with hyperglycemia: Principal | ICD-10-CM

## 2013-12-03 DIAGNOSIS — E669 Obesity, unspecified: Secondary | ICD-10-CM

## 2013-12-03 DIAGNOSIS — R030 Elevated blood-pressure reading, without diagnosis of hypertension: Secondary | ICD-10-CM

## 2013-12-03 LAB — POCT GLYCOSYLATED HEMOGLOBIN (HGB A1C): HEMOGLOBIN A1C: 7.4

## 2013-12-03 MED ORDER — GLIMEPIRIDE 2 MG PO TABS: ORAL_TABLET | ORAL | Status: DC

## 2013-12-03 MED ORDER — SITAGLIPTIN PHOSPHATE 100 MG PO TABS: 100.0000 mg | ORAL_TABLET | Freq: Every day | ORAL | Status: DC

## 2013-12-03 MED ORDER — METFORMIN HCL ER 750 MG PO TB24: ORAL_TABLET | ORAL | Status: DC

## 2013-12-03 NOTE — Patient Instructions (Signed)
Continue current medications; at next visit, if A1c is below 7%, we can consider reducing one of your medications. It is important to check your blood sugars daily. If you have blood sugars below 100, you may have to decrease Metformin dose after morning meal to 1 tablet.   Hypertension As your heart beats, it forces blood through your arteries. This force is your blood pressure. If the pressure is too high, it is called hypertension (HTN) or high blood pressure. HTN is dangerous because you may have it and not know it. High blood pressure may mean that your heart has to work harder to pump blood. Your arteries may be narrow or stiff. The extra work puts you at risk for heart disease, stroke, and other problems.  Blood pressure consists of two numbers, a higher number over a lower, 110/72, for example. It is stated as "110 over 72." The ideal is below 120 for the top number (systolic) and under 80 for the bottom (diastolic). Write down your blood pressure today. You should pay close attention to your blood pressure if you have certain conditions such as:  Heart failure.  Prior heart attack.  Diabetes  Chronic kidney disease.  Prior stroke.  Multiple risk factors for heart disease. To see if you have HTN, your blood pressure should be measured while you are seated with your arm held at the level of the heart. It should be measured at least twice. A one-time elevated blood pressure reading (especially in the Emergency Department) does not mean that you need treatment. There may be conditions in which the blood pressure is different between your right and left arms. It is important to see your caregiver soon for a recheck. Most people have essential hypertension which means that there is not a specific cause. This type of high blood pressure may be lowered by changing lifestyle factors such as:  Stress.  Smoking.  Lack of exercise.  Excessive weight.  Drug/tobacco/alcohol use.  Eating  less salt. Most people do not have symptoms from high blood pressure until it has caused damage to the body. Effective treatment can often prevent, delay or reduce that damage. TREATMENT  When a cause has been identified, treatment for high blood pressure is directed at the cause. There are a large number of medications to treat HTN. These fall into several categories, and your caregiver will help you select the medicines that are best for you. Medications may have side effects. You should review side effects with your caregiver. If your blood pressure stays high after you have made lifestyle changes or started on medicines,   Your medication(s) may need to be changed.  Other problems may need to be addressed.  Be certain you understand your prescriptions, and know how and when to take your medicine.  Be sure to follow up with your caregiver within the time frame advised (usually within two weeks) to have your blood pressure rechecked and to review your medications.  If you are taking more than one medicine to lower your blood pressure, make sure you know how and at what times they should be taken. Taking two medicines at the same time can result in blood pressure that is too low. SEEK IMMEDIATE MEDICAL CARE IF:  You develop a severe headache, blurred or changing vision, or confusion.  You have unusual weakness or numbness, or a faint feeling.  You have severe chest or abdominal pain, vomiting, or breathing problems. MAKE SURE YOU:   Understand these instructions.  Will watch your condition.  Will get help right away if you are not doing well or get worse. Document Released: 11/06/2005 Document Revised: 01/29/2012 Document Reviewed: 06/26/2008 [facility] Patient Information 2014 [place].

## 2013-12-03 NOTE — Progress Notes (Signed)
S:  This 33 y.o. AA male has Type II DM and is compliant w/ medications but not checking FSBS daily. He checks weekly. No symptoms of hypoglycemia. He has lost 31 lbs since October 2014. Maintaining healthy nutrition and regular exercise schedule. Skipped a lot of unhealthy  foods during the Holidays.   Elevated BP-  prior hx of HTN in 2008. Currently not on medication. SBP 120s over last 2 years. Pt denies excessive caffeine consumption or OTC medications or weight loss supplements. He denies diaphoresis, fatigue, vision disturbance, CP or tightness, palpitations, SOB or DOE, edema, HA, dizziness, numbness, weakness, sleep disturbance or syncope.  Patient Active Problem List   Diagnosis Date Noted  . Diabetes mellitus type 2 in obese 07/04/2009    Priority: High    Class: Chronic  . ED (erectile dysfunction) 09/24/2012  . OBESITY, NOS 01/17/2007  . BELLS PALSY 01/17/2007  . HYPERTENSION, BENIGN SYSTEMIC 01/17/2007   ROS: As per HPI. Otherwise unremarkable.  O: Filed Vitals:   12/03/13 1035 12/03/13 1126  BP: 150/100 144/100  Pulse: 75   Temp: 99.1 F (37.3 C)   TempSrc: Oral   Resp: 16   Height: 5' 10.75" (1.797 m)   Weight: 237 lb 6.4 oz (107.684 kg)   SpO2: 100%    GEN: In NAD; WN,WD.  Weight down 31 lbs since Oct 2014. HENT: [place]/AT; EOMI w/ clear conj/sclerae. Otherwise unremarkable. COR: RRR. No edema. LUNGS: Normal resp rate. SKIN: W&D; intact w/o diaphoresis, erythema or rashes. See DM foot exam. NEURO: A&O x 3; CNs intact. Nonfocal.  Results for orders placed in visit on 12/03/13  POCT GLYCOSYLATED HEMOGLOBIN (HGB A1C)      Result Value Range   Hemoglobin A1C 7.4     A/P: Type II or unspecified type diabetes mellitus without mention of complication, uncontrolled - Much improved. Continue nutritional modifications and weight loss. May have to reduce medications in future if A1c drops below 7%.   Check FSBS daily.  Plan: HM Diabetes Foot Exam, POCT glycosylated  hemoglobin (Hb A1C)  Elevated blood-pressure reading without diagnosis of hypertension; monitor & expect normalization with continued weight loss.  OBESITY, NOS  Meds ordered this encounter  Medications  . glimepiride (AMARYL) 2 MG tablet    Sig: Take 1 tablet twice a day before meals.    Dispense:  60 tablet    Refill:  5  . sitaGLIPtin (JANUVIA) 100 MG tablet    Sig: Take 1 tablet (100 mg total) by mouth daily.    Dispense:  30 tablet    Refill:  5  . metFORMIN (GLUCOPHAGE-XR) 750 MG 24 hr tablet    Sig: TAKE 2 TABLETS BY MOUTH WITH BREAKFAST AND 1 TABLET WITH EVENING MEAL.    Dispense:  90 tablet    Refill:  5

## 2014-01-07 ENCOUNTER — Other Ambulatory Visit: Payer: Self-pay

## 2014-01-07 ENCOUNTER — Telehealth: Payer: Self-pay | Admitting: *Deleted

## 2014-01-07 MED ORDER — LANCETS MISC: Status: DC

## 2014-01-07 MED ORDER — BLOOD GLUCOSE METER KIT: PACK | Status: DC

## 2014-01-07 MED ORDER — LANCING DEVICE MISC: Status: DC

## 2014-01-07 MED ORDER — GLUCOSE BLOOD VI STRP: ORAL_STRIP | Status: DC

## 2014-01-07 NOTE — Telephone Encounter (Signed)
Faxed signed RX request to [facility], per Dr Leward Quan. Confirmation page received at 10:20 am.

## 2014-04-02 ENCOUNTER — Encounter: Payer: 59 | Admitting: Family Medicine

## 2014-08-26 ENCOUNTER — Telehealth: Payer: Self-pay

## 2014-08-26 NOTE — Telephone Encounter (Signed)
Clld pt - [place] on cell to schedule Diabetic Care Follow up appt.

## 2014-09-08 ENCOUNTER — Encounter: Payer: Self-pay | Admitting: Family Medicine

## 2014-09-08 ENCOUNTER — Ambulatory Visit (INDEPENDENT_AMBULATORY_CARE_PROVIDER_SITE_OTHER): Payer: 59 | Admitting: Family Medicine

## 2014-09-08 VITALS — BP 140/84 | HR 82 | Temp 98.7°F | Resp 16 | Ht 70.0 in | Wt 266.0 lb

## 2014-09-08 DIAGNOSIS — E1165 Type 2 diabetes mellitus with hyperglycemia: Secondary | ICD-10-CM

## 2014-09-08 DIAGNOSIS — E559 Vitamin D deficiency, unspecified: Secondary | ICD-10-CM

## 2014-09-08 DIAGNOSIS — I10 Essential (primary) hypertension: Secondary | ICD-10-CM

## 2014-09-08 DIAGNOSIS — R635 Abnormal weight gain: Secondary | ICD-10-CM

## 2014-09-08 DIAGNOSIS — IMO0002 Reserved for concepts with insufficient information to code with codable children: Secondary | ICD-10-CM

## 2014-09-08 LAB — POCT GLYCOSYLATED HEMOGLOBIN (HGB A1C): Hemoglobin A1C: 11.9

## 2014-09-08 MED ORDER — GLIMEPIRIDE 4 MG PO TABS: ORAL_TABLET | ORAL | Status: DC

## 2014-09-08 NOTE — Patient Instructions (Addendum)
Mediterranean Diet  Why follow it? Research shows.   Those who follow the Mediterranean diet have a reduced risk of heart disease    The diet is associated with a reduced incidence of Parkinson's and Alzheimer's diseases   People following the diet may have longer life expectancies and lower rates of chronic diseases    The Dietary Guidelines for Americans recommends the Mediterranean diet as an eating plan to promote health and prevent disease  What Is the Mediterranean Diet?    Healthy eating plan based on typical foods and recipes of Mediterranean-style cooking   The diet is primarily a plant based diet; these foods should make up a majority of meals   Starches - Plant based foods should make up a majority of meals - They are an important sources of vitamins, minerals, energy, antioxidants, and fiber - Choose whole grains, foods high in fiber and minimally processed items  - Typical grain sources include wheat, oats, barley, corn, brown rice, bulgar, farro, millet, polenta, couscous  - Various types of beans include chickpeas, lentils, fava beans, black beans, white beans   Fruits  Veggies - Large quantities of antioxidant rich fruits & veggies; 6 or more servings  - Vegetables can be eaten raw or lightly drizzled with oil and cooked  - Vegetables common to the traditional Mediterranean Diet include: artichokes, arugula, beets, broccoli, brussel sprouts, cabbage, carrots, celery, collard greens, cucumbers, eggplant, kale, leeks, lemons, lettuce, mushrooms, okra, onions, peas, peppers, potatoes, pumpkin, radishes, rutabaga, shallots, spinach, sweet potatoes, turnips, zucchini - Fruits common to the Mediterranean Diet include: apples, apricots, avocados, cherries, clementines, dates, figs, grapefruits, grapes, melons, nectarines, oranges, peaches, pears, pomegranates, strawberries, tangerines  Fats - Replace butter and margarine with healthy oils, such as olive oil, canola oil, and tahini   - Limit nuts to no more than a handful a day  - Nuts include walnuts, almonds, pecans, pistachios, pine nuts  - Limit or avoid candied, honey roasted or heavily salted nuts - Olives are central to the Mediterranean diet - can be eaten whole or used in a variety of dishes   Meats Protein - Limiting red meat: no more than a few times a month - When eating red meat: choose lean cuts and keep the portion to the size of deck of cards - Eggs: approx. 0 to 4 times a week  - Fish and lean poultry: at least 2 a week  - Healthy protein sources include, chicken, turkey, lean beef, lamb - Increase intake of seafood such as tuna, salmon, trout, mackerel, shrimp, scallops - Avoid or limit high fat processed meats such as sausage and bacon  Dairy - Include moderate amounts of low fat dairy products  - Focus on healthy dairy such as fat free yogurt, skim milk, low or reduced fat cheese - Limit dairy products higher in fat such as whole or 2% milk, cheese, ice cream  Alcohol - Moderate amounts of red wine is ok  - No more than 5 oz daily for women (all ages) and men older than age 65  - No more than 10 oz of wine daily for men younger than 65  Other - Limit sweets and other desserts  - Use herbs and spices instead of salt to flavor foods  - Herbs and spices common to the traditional Mediterranean Diet include: basil, bay leaves, chives, cloves, cumin, fennel, garlic, lavender, marjoram, mint, oregano, parsley, pepper, rosemary, sage, savory, sumac, tarragon, thyme   It's not just a diet,   it's a lifestyle:    The Mediterranean diet includes lifestyle factors typical of those in the region    Foods, drinks and meals are best eaten with others and savored   Daily physical activity is important for overall good health   This could be strenuous exercise like running and aerobics   This could also be more leisurely activities such as walking, housework, yard-work, or taking the stairs   Moderation is the key;  a balanced and healthy diet accommodates most foods and drinks   Consider portion sizes and frequency of consumption of certain foods   Meal Ideas & Options:    Breakfast:  o Whole wheat toast or whole wheat English muffins with peanut butter & hard boiled egg o Steel cut oats topped with apples & cinnamon and skim milk  o Fresh fruit: banana, strawberries, melon, berries, peaches  o Smoothies: strawberries, bananas, greek yogurt, peanut butter o Low fat greek yogurt with blueberries and granola  o Egg white omelet with spinach and mushrooms o Breakfast couscous: whole wheat couscous, apricots, skim milk, cranberries    Sandwiches:  o Hummus and grilled vegetables (peppers, zucchini, squash) on whole wheat bread   o Grilled chicken on whole wheat pita with lettuce, tomatoes, cucumbers or tzatziki  o Tuna salad on whole wheat bread: tuna salad made with greek yogurt, olives, red peppers, capers, green onions o Garlic rosemary lamb pita: lamb sauted with garlic, rosemary, salt & pepper; add lettuce, cucumber, greek yogurt to pita - flavor with lemon juice and black pepper    Seafood:  o Mediterranean grilled salmon, seasoned with garlic, basil, parsley, lemon juice and black pepper o Shrimp, lemon, and spinach whole-grain pasta salad made with low fat greek yogurt  o Seared scallops with lemon orzo  o Seared tuna steaks seasoned salt, pepper, coriander topped with tomato mixture of olives, tomatoes, olive oil, minced garlic, parsley, green onions and cappers    Meats:  o Herbed greek chicken salad with kalamata olives, cucumber, feta  o Red bell peppers stuffed with spinach, bulgur, lean ground beef (or lentils) & topped with feta   o Kebabs: skewers of chicken, tomatoes, onions, zucchini, squash  o [place] burgers: made with red onions, mint, dill, lemon juice, feta cheese topped with roasted red peppers   Vegetarian o Cucumber salad: cucumbers, artichoke hearts, celery, red onion, feta  cheese, tossed in olive oil & lemon juice  o Hummus and whole grain pita points with a greek salad (lettuce, tomato, feta, olives, cucumbers, red onion) o Lentil soup with celery, carrots made with vegetable broth, garlic, salt and pepper  o Tabouli salad: parsley, bulgur, mint, scallions, cucumbers, tomato, radishes, lemon juice, olive oil, salt and pepper.    DIABETES MEDICATIONS-  I HAVE INCREASED GLIMEPIRIDE TO 4 MG 1 TABLET TWICE A DAY BEFORE [facility]. THE OTHER MEDICATIONS ARE UNCHANGED. YOU WILL BE CONTACT ED ABOUT YOUR APPOINTMENT WITH THE NUTRITIONIST. I WILL SEE YOU AGAIN IN 3 MONTHS. Have your pharmacy contact our office when you need additional refills.

## 2014-09-08 NOTE — Progress Notes (Signed)
Subjective:    Patient ID: Ian Hughes, male    DOB: Mar 29, 1981, 33 y.o.   MRN: [id number]  Diabetes Pertinent negatives for diabetes include no fatigue.    This 33 y.o. AA male is here for Type II DM follow-up; last visit in Jan 2015 w/ A1c= 7.4%. Pt reports wife had a fall last winter, resulting in a fractured patella and torn ligaments and 3 surgeries. He states he has been taking medications as prescribed but then recalls that he takes Glimepiride once a day. States he follows a meal plan but cannot recall calorie consumption; he was keeping track prior to wife's medical problems. Weight is up > 20 lbs.  Pt reports that he has not needed medication refills because he obtained DM supplies from a mail order company; it is not clear who authorized refills since pt states he is still using the [facility].  HCM: Pt has not had vision assessment in last 2-3 years.   Patient Active Problem List   Diagnosis Date Noted  . Diabetes mellitus type 2 in obese 07/04/2009    Priority: High    Class: Chronic  . ED (erectile dysfunction) 09/24/2012  . OBESITY, NOS 01/17/2007  . BELLS PALSY 01/17/2007  . HYPERTENSION, BENIGN SYSTEMIC 01/17/2007    Prior to Admission medications   Medication Sig Start Date End Date Taking? Authorizing Provider  metFORMIN (GLUCOPHAGE-XR) 750 MG 24 hr tablet TAKE 2 TABLETS BY MOUTH WITH BREAKFAST AND 1 TABLET WITH EVENING MEAL. 12/03/13  Yes Barton Fanny, MD  sitaGLIPtin (JANUVIA) 100 MG tablet Take 1 tablet (100 mg total) by mouth daily. 12/03/13  Yes Barton Fanny, MD  Blood Glucose Monitoring Suppl (BLOOD GLUCOSE METER) kit Use to test blood sugar daily. 01/07/14   Barton Fanny, MD  glimepiride (AMARYL) 2 MG tablet Take 1 tablet twice a day before main meals.    Barton Fanny, MD  glucose blood test strip Use to test fasting blood sugar daily. 01/07/14   Barton Fanny, MD  Lancet Devices (LANCING DEVICE) MISC Use  to test blood sugar daily. 01/07/14   Barton Fanny, MD  Lancets MISC Use to test blood sugar daily. 01/07/14   Barton Fanny, MD  sildenafil (VIAGRA) 100 MG tablet Take 0.5-1 tablets (50-100 mg total) by mouth daily as needed for erectile dysfunction. 08/21/13 09/20/13  Barton Fanny, MD    History   Social History  . Marital Status: Single    Spouse Name: N/A    Number of Children: N/A  . Years of Education: N/A   Occupational History  . Not on file.   Social History Main Topics  . Smoking status: Never Smoker   . Smokeless tobacco: Never Used  . Alcohol Use: Yes  . Drug Use: Not on file  . Sexual Activity: Not on file   Other Topics Concern  . Not on file   Social History Narrative  . No narrative on file    Family History  Problem Relation Age of Onset  . Diabetes Mother   . Cancer Father   . Obesity Other      Review of Systems  Constitutional: Positive for unexpected weight change. Negative for diaphoresis, appetite change and fatigue.  Eyes: Negative for visual disturbance.       Objective:   Physical Exam  Nursing note and vitals reviewed. Constitutional: He is oriented to person, place, and time. He appears well-developed and well-nourished. No  distress.  Weight is up ~ 30 lbs.  HENT:  Head: Normocephalic and atraumatic.  Right Ear: External ear normal.  Left Ear: External ear normal.  Mouth/Throat: Oropharynx is clear and moist.  Eyes: Conjunctivae and EOM are normal. Pupils are equal, round, and reactive to light.  Neck: Normal range of motion. No thyromegaly present.  Cardiovascular: Normal rate, regular rhythm and normal heart sounds.  Exam reveals no gallop.   No murmur heard. Pulmonary/Chest: Effort normal and breath sounds normal. No respiratory distress. He has no wheezes. He has no rales.  Musculoskeletal: Normal range of motion. He exhibits no edema.  Lymphadenopathy:    He has no cervical adenopathy.  Neurological: He is  alert and oriented to person, place, and time. He has normal strength. No cranial nerve deficit or sensory deficit. He exhibits normal muscle tone. Coordination and gait normal.  See DM Foot exam.  Skin: Skin is warm, dry and intact. No ecchymosis and no rash noted. He is not diaphoretic. No cyanosis or erythema. Nails show no clubbing.  Psychiatric: He has a normal mood and affect. His speech is normal and behavior is normal. Judgment and thought content normal. Cognition and memory are normal.    Results for orders placed in visit on 09/08/14  POCT GLYCOSYLATED HEMOGLOBIN (HGB A1C)      Result Value Ref Range   Hemoglobin A1C 11.9        Assessment & Plan:  Type II diabetes mellitus, uncontrolled - I suspect pt has not been compliant with medications or diet; his weight gain confirms lack of adherence to meal planning or exercise. Continue Januvia & Metformin at current doses; Glimepiride- increase to 4 mg 1 tablet before AM meal and 1/2 tab before PM meal. Plan: HM Diabetes Foot Exam, POCT glycosylated hemoglobin (Hb A1C), COMPLETE METABOLIC PANEL WITH GFR, Ambulatory referral to Nutrition and Diabetic Education  HYPERTENSION, BENIGN SYSTEMIC- Stable; weight loss would improve control. Consider adding ACEI or ARB at next visit.  Vitamin D deficiency - Plan: Vitamin D, 25-hydroxy  Weight gain- Refer pt back to DM education educator.  Have pharmacy contact the clinic if medication refills are needed.   Meds ordered this encounter  Medications  . glimepiride (AMARYL) 4 MG tablet    Sig: Take 1 tablet before your largest meal and 1/2 tablet before smaller meal.    Dispense:  60 tablet    Refill:  3

## 2014-09-09 LAB — COMPLETE METABOLIC PANEL WITH GFR
ALT: 22 U/L (ref 0–53)
AST: 12 U/L (ref 0–37)
Albumin: 4.1 g/dL (ref 3.5–5.2)
Alkaline Phosphatase: 101 U/L (ref 39–117)
BILIRUBIN TOTAL: 0.4 mg/dL (ref 0.2–1.2)
BUN: 13 mg/dL (ref 6–23)
CO2: 26 meq/L (ref 19–32)
Calcium: 9.5 mg/dL (ref 8.4–10.5)
Chloride: 103 mEq/L (ref 96–112)
Creat: 1.04 mg/dL (ref 0.50–1.35)
GLUCOSE: 247 mg/dL — AB (ref 70–99)
Potassium: 4.1 mEq/L (ref 3.5–5.3)
SODIUM: 138 meq/L (ref 135–145)
TOTAL PROTEIN: 6.3 g/dL (ref 6.0–8.3)

## 2014-09-09 LAB — VITAMIN D 25 HYDROXY (VIT D DEFICIENCY, FRACTURES): Vit D, 25-Hydroxy: 29 ng/mL — ABNORMAL LOW (ref 30–89)

## 2014-09-11 NOTE — Progress Notes (Signed)
Quick Note:  Please advise pt regarding following labs... Blood sugar is high but all other results are normal. Kidney and liver function results are normal. The kidney function number (Cr= 1.04) is slowly increasing. This is due to uncontrolled Diabetes in addition to hypertension. Focus on improved lifestyle (nutrition and exercise) for weight loss. Your were given information about MEDITERRANEAN DIET at your last visit. This is a great guide for healthier living.  Vitamin D level is below normal; get a Vitamin D3 supplement- 2000 units- and take this daily. Sun exposure for 10-15 minutes most days of the week is important so that your body can use the Vitamin D. This will improve overall metabolism.  Contact the clinic if you have questions or concerns. ______

## 2014-11-05 ENCOUNTER — Ambulatory Visit: Payer: 59 | Admitting: *Deleted

## 2014-12-10 ENCOUNTER — Ambulatory Visit (INDEPENDENT_AMBULATORY_CARE_PROVIDER_SITE_OTHER): Payer: 59 | Admitting: Family Medicine

## 2014-12-10 ENCOUNTER — Encounter: Payer: Self-pay | Admitting: Family Medicine

## 2014-12-10 VITALS — BP 140/80 | HR 87 | Temp 98.1°F | Resp 16 | Ht 70.0 in | Wt 265.0 lb

## 2014-12-10 DIAGNOSIS — I10 Essential (primary) hypertension: Secondary | ICD-10-CM

## 2014-12-10 DIAGNOSIS — B351 Tinea unguium: Secondary | ICD-10-CM

## 2014-12-10 DIAGNOSIS — N529 Male erectile dysfunction, unspecified: Secondary | ICD-10-CM

## 2014-12-10 DIAGNOSIS — E119 Type 2 diabetes mellitus without complications: Secondary | ICD-10-CM

## 2014-12-10 DIAGNOSIS — R0681 Apnea, not elsewhere classified: Secondary | ICD-10-CM

## 2014-12-10 LAB — COMPLETE METABOLIC PANEL WITH GFR
ALT: 25 U/L (ref 0–53)
AST: 22 U/L (ref 0–37)
Albumin: 4.6 g/dL (ref 3.5–5.2)
Alkaline Phosphatase: 107 U/L (ref 39–117)
BILIRUBIN TOTAL: 0.5 mg/dL (ref 0.2–1.2)
BUN: 15 mg/dL (ref 6–23)
CALCIUM: 9.9 mg/dL (ref 8.4–10.5)
CO2: 31 mEq/L (ref 19–32)
Chloride: 103 mEq/L (ref 96–112)
Creat: 1.01 mg/dL (ref 0.50–1.35)
GFR, Est African American: >89 mL/min
GFR, Est Non African American: >89 mL/min
Glucose, Bld: 91 mg/dL (ref 70–99)
Potassium: 3.9 mEq/L (ref 3.5–5.3)
SODIUM: 141 meq/L (ref 135–145)
Total Protein: 7.2 g/dL (ref 6.0–8.3)

## 2014-12-10 LAB — POCT GLYCOSYLATED HEMOGLOBIN (HGB A1C): Hemoglobin A1C: 9

## 2014-12-10 MED ORDER — LISINOPRIL 10 MG PO TABS: 10.0000 mg | ORAL_TABLET | Freq: Every day | ORAL | Status: DC

## 2014-12-10 MED ORDER — GLIMEPIRIDE 4 MG PO TABS
ORAL_TABLET | ORAL | Status: DC
Start: 2014-12-10 — End: 2016-02-10

## 2014-12-10 MED ORDER — METFORMIN HCL ER 750 MG PO TB24
ORAL_TABLET | ORAL | Status: DC
Start: 2014-12-10 — End: 2015-06-22

## 2014-12-10 MED ORDER — TADALAFIL 10 MG PO TABS: 10.0000 mg | ORAL_TABLET | Freq: Every day | ORAL | Status: DC | PRN

## 2014-12-10 MED ORDER — SITAGLIPTIN PHOSPHATE 100 MG PO TABS: 100.0000 mg | ORAL_TABLET | Freq: Every day | ORAL | Status: DC

## 2014-12-10 NOTE — Progress Notes (Signed)
Subjective:    Patient ID: Ian Hughes, male    DOB: 1981/02/13, 34 y.o.   MRN: [id number]  HPI  This is a 34 year old male with PMH DMII, HTN and ED who is presenting for DMII follow up. He is accompanied by his wife and son.   DMII: checks BG 1-2x a month at night a couple hours after dinner and usually 180- He is taking metformin 750 mg BID, glimepiride 4 mg BID, and januvia 100 mg QAM. Last A1C 3 months ago 11.9 up from 7.4 ten months prior. He states he really hasn't changed his diet or worked on weight loss since last visit. He states he hasn't been motivated. He has gained 16 pounds in the past 1 year. States mood is ok. He only drinks water and diet green tea. He has not done very well with cutting out carbs. States he still eats rice, pasta, macaroni. He tries not to eat too much fruit d/t sugar content. He eats lots of vegetables. His major problems is with portion sizes. For exercise he enjoys weight lifting and playing sports (basketball, football) but he just does not feel motivated to do it. He denies abdominal pain, N/V/D, urinary complaints, or paresthesias. He has not had an eye exam in 3-4 years.  HTN: never been on anything for blood pressure.  ED: Pt states viagra didn't work for him. He would like to try cialis.  He states he wants something for his toenail fungus. He has had problems with it since he was child and has never tried anything for it.  His wife reports he snores and she has witnessed him stop breathing.   Review of Systems  Constitutional: Negative for fever and chills.  Eyes: Negative for visual disturbance.  Respiratory: Negative for shortness of breath.   Cardiovascular: Negative for chest pain.  Gastrointestinal: Negative for nausea, vomiting, abdominal pain and diarrhea.  Genitourinary: Negative for difficulty urinating.  Skin: Negative for rash.  Neurological: Negative for numbness.  Psychiatric/Behavioral: Negative for dysphoric mood.    Patient Active Problem List   Diagnosis Date Noted  . ED (erectile dysfunction) 09/24/2012  . Diabetes mellitus type 2 in obese 07/04/2009    Class: Chronic  . OBESITY, NOS 01/17/2007  . BELLS PALSY 01/17/2007  . HYPERTENSION, BENIGN SYSTEMIC 01/17/2007   Prior to Admission medications   Medication Sig Start Date End Date Taking? Authorizing Provider  Blood Glucose Monitoring Suppl (BLOOD GLUCOSE METER) kit Use to test blood sugar daily. 01/07/14   Barton Fanny, MD  glimepiride (AMARYL) 4 MG tablet Take 1 tablet before your largest meal and 1/2 tablet before smaller meal. 09/08/14   Barton Fanny, MD  glucose blood test strip Use to test fasting blood sugar daily. 01/07/14   Barton Fanny, MD  Lancet Devices (LANCING DEVICE) MISC Use to test blood sugar daily. 01/07/14   Barton Fanny, MD  Lancets MISC Use to test blood sugar daily. 01/07/14   Barton Fanny, MD  metFORMIN (GLUCOPHAGE-XR) 750 MG 24 hr tablet TAKE 2 TABLETS BY MOUTH WITH BREAKFAST AND 1 TABLET WITH EVENING MEAL. 12/03/13   Barton Fanny, MD  sildenafil (VIAGRA) 100 MG tablet Take 0.5-1 tablets (50-100 mg total) by mouth daily as needed for erectile dysfunction. 08/21/13 09/20/13  Barton Fanny, MD  sitaGLIPtin (JANUVIA) 100 MG tablet Take 1 tablet (100 mg total) by mouth daily. 12/03/13   Barton Fanny, MD   No Known Allergies  Past Surgical History  Procedure Laterality Date  . Eye surgery  2011    cataract- R eye      Objective:   Physical Exam  Constitutional: He is oriented to person, place, and time. He appears well-developed and well-nourished. No distress.  HENT:  Head: Normocephalic and atraumatic.  Right Ear: Hearing normal.  Left Ear: Hearing normal.  Nose: Nose normal.  Eyes: Conjunctivae and lids are normal. Right eye exhibits no discharge. Left eye exhibits no discharge. No scleral icterus.  Cardiovascular: Normal rate, regular rhythm, normal heart sounds,  intact distal pulses and normal pulses.   No murmur heard. Pulmonary/Chest: Effort normal and breath sounds normal. No respiratory distress. He has no wheezes. He has no rhonchi. He has no rales.  Musculoskeletal: Normal range of motion.  Neurological: He is alert and oriented to person, place, and time. No sensory deficit.  Diabetic foot exam: pedal pulses 2+ and symmetric. Sensation intact. Skin intact.    Skin: Skin is warm, dry and intact. No lesion and no rash noted.  All toenails thick with dark discoloration  Psychiatric: He has a normal mood and affect. His speech is normal and behavior is normal. Thought content normal.   BP 140/80 mmHg  Pulse 87  Temp(Src) 98.1 F (36.7 C) (Oral)  Resp 16  Ht '5\' 10"'  (1.778 m)  Wt 265 lb (120.203 kg)  BMI 38.02 kg/m2  SpO2 100%     Assessment & Plan:  1. Diabetes mellitus without complication 2. Encounter for diabetic foot exam 3. Essential hypertension A1C decreased to 9.0 from 11.9 three months ago. Will keep diabetes medications the same. Start lisinopril for HTN and to protect kidneys. Counseled on exercise and portion control. He should try to check BG more frequently. He was encouraged to make appt with ophthalmology.  - POCT glycosylated hemoglobin (Hb A1C) - COMPLETE METABOLIC PANEL WITH GFR - Microalbumin, urine - glimepiride (AMARYL) 4 MG tablet; Take 1 tablet before your largest meal and 1 tablet before smaller meal.  Dispense: 60 tablet; Refill: 5 - metFORMIN (GLUCOPHAGE-XR) 750 MG 24 hr tablet; TAKE 2 TABLETS BY MOUTH WITH BREAKFAST AND 2 TABLETS WITH EVENING MEAL.  Dispense: 120 tablet; Refill: 5 - sitaGLIPtin (JANUVIA) 100 MG tablet; Take 1 tablet (100 mg total) by mouth daily.  Dispense: 30 tablet; Refill: 5 - lisinopril (PRINIVIL,ZESTRIL) 10 MG tablet; Take 1 tablet (10 mg total) by mouth daily.  Dispense: 90 tablet; Refill: 1  2. Onychomycosis Pt has had onychomycosis since a child. Due to long course of disease, will  refer to dermatology. - Ambulatory referral to Dermatology  3. Witnessed apneic spells WIfe reports pt snores and she has witnessed apneic episodes. Will refer for sleep study. - Ambulatory referral to Neurology - Ambulatory referral to Sleep Studies  4. Erectile dysfunction Patient will try cialis.     Benjaman Pott Drenda Freeze, MHS [facility]  12/10/2014

## 2014-12-10 NOTE — Patient Instructions (Addendum)
Continue taking current medications for Diabetes and focus on staying active. Reduce portion sizes and aim for 1-2 pounds of weight loss per month.  You are being referred for evaluation of sleep disorder/ sleep apnea. See information below. You are also going to see a dermatologist to treat the chronic nail fungus. Our staff will contact you with the details of your appointments.  Please get your annual Diabetes eye exam; this is an important part of Diabetes care.    You are being started on a medication to lower your blood pressure and protect your kidneys.  I will see you again in 3 months.   Sleep Apnea  Sleep apnea is a sleep disorder characterized by abnormal pauses in breathing while you sleep. When your breathing pauses, the level of oxygen in your blood decreases. This causes you to move out of deep sleep and into light sleep. As a result, your quality of sleep is poor, and the system that carries your blood throughout your body (cardiovascular system) experiences stress. If sleep apnea remains untreated, the following conditions can develop:  High blood pressure (hypertension).  Coronary artery disease.  Inability to achieve or maintain an erection (impotence).  Impairment of your thought process (cognitive dysfunction). There are three types of sleep apnea: 1. Obstructive sleep apnea--Pauses in breathing during sleep because of a blocked airway. 2. Central sleep apnea--Pauses in breathing during sleep because the area of the brain that controls your breathing does not send the correct signals to the muscles that control breathing. 3. Mixed sleep apnea--A combination of both obstructive and central sleep apnea. RISK FACTORS The following risk factors can increase your risk of developing sleep apnea:  Being overweight.  Smoking.  Having narrow passages in your nose and throat.  Being of older age.  Being male.  Alcohol use.  Sedative and tranquilizer  use.  Ethnicity. Among individuals younger than 35 years, African Americans are at increased risk of sleep apnea. SYMPTOMS   Difficulty staying asleep.  Daytime sleepiness and fatigue.  Loss of energy.  Irritability.  Loud, heavy snoring.  Morning headaches.  Trouble concentrating.  Forgetfulness.  Decreased interest in sex. DIAGNOSIS  In order to diagnose sleep apnea, your caregiver will perform a physical examination. Your caregiver may suggest that you take a home sleep test. Your caregiver may also recommend that you spend the night in a sleep lab. In the sleep lab, several monitors record information about your heart, lungs, and brain while you sleep. Your leg and arm movements and blood oxygen level are also recorded. TREATMENT The following actions may help to resolve mild sleep apnea:  Sleeping on your side.   Using a decongestant if you have nasal congestion.   Avoiding the use of depressants, including alcohol, sedatives, and narcotics.   Losing weight and modifying your diet if you are overweight. There also are devices and treatments to help open your airway:  Oral appliances. These are custom-made mouthpieces that shift your lower jaw forward and slightly open your bite. This opens your airway.  Devices that create positive airway pressure. This positive pressure "splints" your airway open to help you breathe better during sleep. The following devices create positive airway pressure:  Continuous positive airway pressure (CPAP) device. The CPAP device creates a continuous level of air pressure with an air pump. The air is delivered to your airway through a mask while you sleep. This continuous pressure keeps your airway open.  Nasal expiratory positive airway pressure (EPAP) device. The  EPAP device creates positive air pressure as you exhale. The device consists of single-use valves, which are inserted into each nostril and held in place by adhesive. The  valves create very little resistance when you inhale but create much more resistance when you exhale. That increased resistance creates the positive airway pressure. This positive pressure while you exhale keeps your airway open, making it easier to breath when you inhale again.  Bilevel positive airway pressure (BPAP) device. The BPAP device is used mainly in patients with central sleep apnea. This device is similar to the CPAP device because it also uses an air pump to deliver continuous air pressure through a mask. However, with the BPAP machine, the pressure is set at two different levels. The pressure when you exhale is lower than the pressure when you inhale.  Surgery. Typically, surgery is only done if you cannot comply with less invasive treatments or if the less invasive treatments do not improve your condition. Surgery involves removing excess tissue in your airway to create a wider passage way. Document Released: 10/27/2002 Document Revised: 03/03/2013 Document Reviewed: 03/14/2012 [facility] Patient Information 2015 [place], [place]. This information is not intended to replace advice given to you by your health care provider. Make sure you discuss any questions you have with your health care provider.

## 2014-12-11 LAB — MICROALBUMIN, URINE: MICROALB UR: 1.5 mg/dL (ref <2.0)

## 2014-12-15 NOTE — Progress Notes (Signed)
[name] is here for follow-up re: Type II DM, elevated BP, obesity and hx of noncompliance. He reports medication compliance and actually has increased medication doses (w/o consulting care provider).   See details of Ian Scrape, PA-C documentation; I have interviewed the pt and agree with her notes.  PMHx, SURG Hx, MEDICATIONS reviewed.  ROS: As per HPI. Nothing to add.  O: Filed Vitals:   12/10/14 1621  BP: 140/80  Pulse: 87  Temp: 98.1 F (36.7 C)  Resp: 16    Examination is as per Ian Epstein, PA-C notes. Nothing to add.  A/P: Diabetes mellitus without complication - Plan: POCT glycosylated hemoglobin (Hb A1C), COMPLETE METABOLIC PANEL WITH GFR, Microalbumin, urine, glimepiride (AMARYL) 4 MG tablet, metFORMIN (GLUCOPHAGE-XR) 750 MG 24 hr tablet, sitaGLIPtin (JANUVIA) 100 MG tablet  Onychomycosis - Plan: Ambulatory referral to Dermatology  Witnessed apneic spells - Plan: Ambulatory referral to Neurology, Ambulatory referral to Sleep Studies  Essential hypertension - Plan: lisinopril (PRINIVIL,ZESTRIL) 10 MG tablet  Encounter for diabetic foot exam  Erectile dysfunction, unspecified erectile dysfunction type - Plan: tadalafil (CIALIS) 10 MG tablet   I discussed lifestyle changes that pt and wife need to seriously consider in order to improve overall health and increase longevity. He does endorse need to decrease portions at meals.  Ian Poisson, MD [facility]

## 2014-12-15 NOTE — Progress Notes (Signed)
Quick Note:  Please notify pt that results are normal.   Provide pt with copy of labs. ______ 

## 2014-12-17 ENCOUNTER — Encounter: Payer: Self-pay | Admitting: Family Medicine

## 2014-12-21 ENCOUNTER — Encounter: Payer: Self-pay | Admitting: Neurology

## 2014-12-21 ENCOUNTER — Ambulatory Visit (INDEPENDENT_AMBULATORY_CARE_PROVIDER_SITE_OTHER): Payer: 59 | Admitting: Neurology

## 2014-12-21 VITALS — BP 135/80 | HR 93 | Ht 70.0 in | Wt 272.0 lb

## 2014-12-21 DIAGNOSIS — R400 Somnolence: Secondary | ICD-10-CM

## 2014-12-21 DIAGNOSIS — R0683 Snoring: Secondary | ICD-10-CM | POA: Insufficient documentation

## 2014-12-21 DIAGNOSIS — R0681 Apnea, not elsewhere classified: Secondary | ICD-10-CM | POA: Insufficient documentation

## 2014-12-21 NOTE — Progress Notes (Signed)
[facility]    Provider:  Dr Jaynee Eagles Referring Provider: Barton Fanny, MD Primary Care Physician:  Ellsworth Lennox, MD  CC:  Witnessed apneic events  HPI:  Ian Hughes is a 34 y.o. male here as a referral from Dr. Leward Quan for witnessed apneic events  Patient is here for witnessed apneic events. His wife has noticed apneic events. He has to be at work at 4:30am and so he has been falling asleep on the couch in the early evenings with his wife. She has noticed really bad snoring, gasping for breath, he stops breathing. Unknown when it started. She noticed it January 2013. Has never been evaluated with a sleep study. He is excessively tired, dozes off the minute he sits down. Has gained weight in the last year, 33 pounds. No HTN. No Strokes. Never feels rested. Has fallen asleep as a passenger in a car. No headaches. No cognitive changes. 43cm neck circumference malampati IV. Epworth sleepiness scale 14. (+3 sitting and reading, +3 watching TV, +2 sitting inactive in public place, +3 as a passenger, +3 lying down to rest in the afternoon).   Reviewed records: HgbA1c 9, CMP unremarkable,   Review of Systems: Patient complains of symptoms per HPI as well as the following symptoms: Snoring, numbness, impotence. No CP, no SOB. Pertinent negatives per HPI. All others negative.   History   Social History  . Marital Status: Married    Spouse Name: N/A    Number of Children: 1  . Years of Education: 12+   Occupational History  .  [employer]   Social History Main Topics  . Smoking status: Never Smoker   . Smokeless tobacco: Never Used  . Alcohol Use: 0.0 oz/week    0 Not specified per week     Comment: 1 or 2 every 3 months   . Drug Use: Not on file  . Sexual Activity: Not on file   Other Topics Concern  . Not on file   Social History Narrative   Lives at home with wife and stepson.   Caffeine use: none   Has one child.    Family History    Problem Relation Age of Onset  . Diabetes Mother   . Cancer Father     leukemia   . Obesity Other     Past Medical History  Diagnosis Date  . Cataract 2011    surgery on R eye  . Diabetes mellitus without complication   . Obesity     Past Surgical History  Procedure Laterality Date  . Eye surgery Left 2011    cataract  . Eye surgery Right 2012    Current Outpatient Prescriptions  Medication Sig Dispense Refill  . Blood Glucose Monitoring Suppl (BLOOD GLUCOSE METER) kit Use to test blood sugar daily. 1 each 0  . glimepiride (AMARYL) 4 MG tablet Take 1 tablet before your largest meal and 1 tablet before smaller meal. 60 tablet 5  . glucose blood test strip Use to test fasting blood sugar daily. 100 each 3  . Lancet Devices (LANCING DEVICE) MISC Use to test blood sugar daily. 1 each 0  . Lancets MISC Use to test blood sugar daily. 100 each 3  . lisinopril (PRINIVIL,ZESTRIL) 10 MG tablet Take 1 tablet (10 mg total) by mouth daily. 90 tablet 1  . metFORMIN (GLUCOPHAGE-XR) 750 MG 24 hr tablet TAKE 2 TABLETS BY MOUTH WITH BREAKFAST AND 2 TABLETS WITH EVENING MEAL. 120 tablet 5  . sitaGLIPtin (  JANUVIA) 100 MG tablet Take 1 tablet (100 mg total) by mouth daily. 30 tablet 5  . tadalafil (CIALIS) 10 MG tablet Take 1 tablet (10 mg total) by mouth daily as needed for erectile dysfunction. 10 tablet 1   No current facility-administered medications for this visit.    Allergies as of 12/21/2014  . (No Known Allergies)    Vitals: BP 135/80 mmHg  Pulse 93  Ht _0  (1.778 m)  Wt 272 lb (123.378 kg)  BMI 39.03 kg/m2 Last Weight:  Wt Readings from Last 1 Encounters:  12/21/14 272 lb (123.378 kg)   Last Height:   Ht Readings from Last 1 Encounters:  12/21/14 _1  (1.778 m)   Physical exam: Exam: Gen: NAD, conversant, well nourised, obese, well groomed                     CV: RRR, no MRG. No Carotid Bruits. No peripheral edema, warm, nontender Eyes: Conjunctivae clear  without exudates or hemorrhage  Neuro: Detailed Neurologic Exam  Speech:    Speech is normal; fluent and spontaneous with normal comprehension.  Cognition:    The patient is oriented to person, place, and time;     recent and remote memory intact;     language fluent;     normal attention, concentration,     fund of knowledge Cranial Nerves:    The pupils are equal, round, and reactive to light. The fundi are normal and spontaneous venous pulsations are present. Visual fields are full to finger confrontation. Extraocular movements are intact. Trigeminal sensation is intact and the muscles of mastication are normal. The face is symmetric. The palate elevates in the midline. Hearing intact. Voice is normal. Shoulder shrug is normal. The tongue has normal motion without fasciculations.   Coordination:    Normal finger to nose and heel to shin. Normal rapid alternating movements.   Gait:    Heel-toe and tandem gait are normal.   Motor Observation:    No asymmetry, no atrophy, and no involuntary movements noted. Tone:    Normal muscle tone.    Posture:    Posture is normal. normal erect    Strength:    Strength is V/V in the upper and lower limbs.      Sensation: intact to LT     Reflex Exam:  DTR's:    Absent achilles otherwise deep tendon reflexes in the upper and lower extremities are normal bilaterally.   Toes:    The toes are equivocal bilaterally.   Clonus:    Clonus is absent.       Assessment/Plan:  35 year old male here with witnessed apneic events, stops breathing in his sleep, excessive daytime fatigue, obesity,  43cm neck circumference malampati IV. Epworth sleepiness scale 14. (+3 sitting and reading, +3 watching TV, +2 sitting inactive in public place, +3 as a passenger, +3 lying down to rest in the afternoon). Neuro exam is non focal. Will refer to the sleep team for evaluation of sleep study.   Sarina Ill, MD  [facility] [address] [place], [place] [address]  Phone [phone] Fax [phone]  A total of 45 minutes was spent in with this patient. Over half this time was spent on counseling patient on the OSA diagnosis and different therapeutic options available.

## 2014-12-21 NOTE — Patient Instructions (Signed)
Overall you are doing fairly well but I do want to suggest a few things today:   Remember to drink plenty of fluid, eat healthy meals and do not skip any meals. Try to eat protein with a every meal and eat a healthy snack such as fruit or nuts in between meals. Try to keep a regular sleep-wake schedule and try to exercise daily, particularly in the form of walking, 20-30 minutes a day, if you can.   As far as your medications are concerned, I would like to suggest  As far as diagnostic testing: Sleep study  Please also call us for any test results so we can go over those with you on the phone.  My clinical assistant and will answer any of your questions and relay your messages to me and also relay most of my messages to you.   Our phone number is [phone]. We also have an after hours call service for urgent matters and there is a physician on-call for urgent questions. For any emergencies you know to call 911 or go to the nearest emergency room

## 2014-12-22 ENCOUNTER — Telehealth: Payer: Self-pay | Admitting: Neurology

## 2014-12-22 NOTE — Telephone Encounter (Signed)
Dr. Sarina Ill, refers patient for attended sleep study.  Height: 5'10"  Weight: 272 lb  BMI: 39.03  Past Medical History:  Cataract 2011     surgery on R eye  . Diabetes mellitus without complication   . Obesity     Sleep Symptoms: Witnessed apneic events. Falling asleep on the couch in the early evenings. Wife has noticed really bad snoring, gasping for breath, he stops breathing. He is excessively tired, dozes off the minute he sits down. Has gained weight in the last year, 33 pounds. Never feels rested.   Epworth Score: Epworth sleepiness scale 14. (+3 sitting and reading, +3 watching TV, +2 sitting inactive in public place, +3 as a passenger, +3 lying down to rest in the afternoon).    Medication:  Blood Glucose Monitoring Suppl (Kit) blood glucose meter kit and supplies  Use to test blood sugar daily.       Glimepiride (Tab) AMARYL 4 MG Take 1 tablet before your largest meal and 1 tablet before smaller meal.      Glucose Blood (Strip) glucose blood  Use to test fasting blood sugar daily.      Lancet Devices (Misc) Lancing Device  Use to test blood sugar daily.      Lancets (Misc) Lancets  Use to test blood sugar daily.      Lisinopril (Tab) PRINIVIL,ZESTRIL 10 MG Take 1 tablet (10 mg total) by mouth daily.      MetFORMIN HCl (Tablet SR 24 hr) GLUCOPHAGE-XR 750 MG TAKE 2 TABLETS BY MOUTH WITH BREAKFAST AND 2 TABLETS WITH EVENING MEAL.      SitaGLIPtin Phosphate (Tab) JANUVIA 100 MG Take 1 tablet (100 mg total) by mouth daily.      Tadalafil (Tab) CIALIS 10 MG Take 1 tablet (10 mg total) by mouth daily as needed for erectile dysfunction.       Ins: [employer]   Assessment & Plan: 34 year old male here with witnessed apneic events, stops breathing in his sleep, excessive daytime fatigue, obesity, 43cm neck circumference malampati IV. Epworth sleepiness scale 14. (+3 sitting and reading, +3 watching TV, +2 sitting inactive in public  place, +3 as a passenger, +3 lying down to rest in the afternoon). Neuro exam is non focal. Will refer to the sleep team for evaluation of sleep study.   Sarina Ill, MD   Please review patient information and submit instructions for scheduling and orders for sleep technologist. Thank you.

## 2015-02-17 ENCOUNTER — Telehealth: Payer: Self-pay | Admitting: Neurology

## 2015-02-17 DIAGNOSIS — E662 Morbid (severe) obesity with alveolar hypoventilation: Secondary | ICD-10-CM

## 2015-02-17 DIAGNOSIS — G4730 Sleep apnea, unspecified: Secondary | ICD-10-CM

## 2015-02-17 DIAGNOSIS — R0683 Snoring: Secondary | ICD-10-CM

## 2015-02-17 NOTE — Telephone Encounter (Signed)
Patient is scheduled for split night on 02/22/15 could you please place the order for me.

## 2015-02-22 ENCOUNTER — Other Ambulatory Visit: Payer: Self-pay | Admitting: Neurology

## 2015-02-22 ENCOUNTER — Ambulatory Visit (INDEPENDENT_AMBULATORY_CARE_PROVIDER_SITE_OTHER): Payer: Self-pay | Admitting: Neurology

## 2015-02-22 DIAGNOSIS — G4733 Obstructive sleep apnea (adult) (pediatric): Secondary | ICD-10-CM

## 2015-02-22 DIAGNOSIS — Z0289 Encounter for other administrative examinations: Secondary | ICD-10-CM

## 2015-02-22 NOTE — Sleep Study (Signed)
Please see the scanned sleep study interpretation located in the Procedure tab within the Chart Review section. 

## 2015-03-08 ENCOUNTER — Ambulatory Visit (INDEPENDENT_AMBULATORY_CARE_PROVIDER_SITE_OTHER): Payer: 59 | Admitting: Neurology

## 2015-03-08 DIAGNOSIS — R0683 Snoring: Secondary | ICD-10-CM

## 2015-03-08 DIAGNOSIS — G478 Other sleep disorders: Secondary | ICD-10-CM

## 2015-03-09 NOTE — Sleep Study (Signed)
Please see the scanned sleep study interpretation located in the Procedure tab within the Chart Review section. 

## 2015-03-11 ENCOUNTER — Ambulatory Visit (INDEPENDENT_AMBULATORY_CARE_PROVIDER_SITE_OTHER): Payer: 59 | Admitting: Family Medicine

## 2015-03-11 ENCOUNTER — Encounter: Payer: Self-pay | Admitting: Family Medicine

## 2015-03-11 VITALS — BP 120/78 | HR 81 | Temp 98.2°F | Resp 16 | Ht 70.0 in | Wt 263.4 lb

## 2015-03-11 DIAGNOSIS — E119 Type 2 diabetes mellitus without complications: Secondary | ICD-10-CM

## 2015-03-11 LAB — POCT GLYCOSYLATED HEMOGLOBIN (HGB A1C): Hemoglobin A1C: 7.1

## 2015-03-11 NOTE — Patient Instructions (Signed)
You have enough refills to get you through June 2016. Continue healthy food choices and staying active.  If your A1c if better and down below 8%, I suggest you schedule a complete physical with fasting labs in 4 months (call the office in July to schedule this visit).

## 2015-03-11 NOTE — Progress Notes (Signed)
S:  [name] has Type II DM ;  A1c= 11.9% in Oct 2015 >> 9.0% in Jan 2016. Pt is compliant with medications and has made changes in nutrition. He tries to be more active but has a lot of personal obligations that encroach on work-out time. He feels good and denies adverse medications effects. He denies hypoglycemia; no FSBS log but reports 129 glucose one hour post-prandial.  Patient Active Problem List   Diagnosis Date Noted  . Diabetes mellitus type 2 in obese 07/04/2009    Priority: High    Class: Chronic  . Snoring 12/21/2014  . Drowsiness 12/21/2014  . Witnessed apneic spells 12/21/2014  . ED (erectile dysfunction) 09/24/2012  . OBESITY, NOS 01/17/2007  . BELLS PALSY 01/17/2007  . HYPERTENSION, BENIGN SYSTEMIC 01/17/2007    Prior to Admission medications   Medication Sig Start Date End Date Taking? Authorizing Provider  glimepiride (AMARYL) 4 MG tablet Take 1 tablet before your largest meal and 1 tablet before smaller meal. 12/10/14  Yes Bennett Scrape V, PA-C  lisinopril (PRINIVIL,ZESTRIL) 10 MG tablet Take 1 tablet (10 mg total) by mouth daily. 12/10/14  Yes Bennett Scrape V, PA-C  metFORMIN (GLUCOPHAGE-XR) 750 MG 24 hr tablet TAKE 2 TABLETS BY MOUTH WITH BREAKFAST AND 2 TABLETS WITH EVENING MEAL. 12/10/14  Yes Bennett Scrape V, PA-C  sitaGLIPtin (JANUVIA) 100 MG tablet Take 1 tablet (100 mg total) by mouth daily. 12/10/14  Yes Bennett Scrape V, PA-C  tadalafil (CIALIS) 10 MG tablet Take 1 tablet (10 mg total) by mouth daily as needed for erectile dysfunction. 12/10/14  Yes Bennett Scrape V, PA-C  Blood Glucose Monitoring Suppl (BLOOD GLUCOSE METER) kit Use to test blood sugar daily. 01/07/14   Barton Fanny, MD  glucose blood test strip Use to test fasting blood sugar daily. 01/07/14   Barton Fanny, MD  Lancet Devices (LANCING DEVICE) MISC Use to test blood sugar daily. 01/07/14   Barton Fanny, MD  Lancets MISC Use to test blood sugar daily. 01/07/14   Barton Fanny, MD     SURG, SOC and FAM HX reviewed.  ROS: As per HPI. Negative for abnormal weight change, diaphoresis, fatigue, vision disturbances, CP or palpitations, SOB, edema, HA, dizziness, numbness, weakness or syncope.  O: Filed Vitals:   03/11/15 1623  BP: 120/78  Pulse: 81  Temp: 98.2 F (36.8 C)  Resp: 16    HENT: [place]/AT; EOMI w/ clear conj/sclerae. Otherwise unremarkable. COR: RRR. No edema. LUNGS: Normal resp rate and effort. SKIN; W&D; intact. NEURO: A&O x 3; CNs intact. Nonfocal.  Results for orders placed or performed in visit on 03/11/15  POCT glycosylated hemoglobin (Hb A1C)  Result Value Ref Range   Hemoglobin A1C 7.1     A/P: Diabetes mellitus without complication - Much improved glycemic control; continue current medications and lifestyle changes. Plan: HM Diabetes Foot Exam, POCT glycosylated hemoglobin (Hb A1C)

## 2015-03-31 ENCOUNTER — Telehealth: Payer: Self-pay

## 2015-03-31 NOTE — Telephone Encounter (Signed)
Called pt to ask whether he would prefer a home cpap auto titration or a cpap titration in the office, since he is willing to proceed with cpap. Left a message asking him to call me back.

## 2015-04-06 ENCOUNTER — Other Ambulatory Visit: Payer: Self-pay

## 2015-04-06 DIAGNOSIS — G4733 Obstructive sleep apnea (adult) (pediatric): Secondary | ICD-10-CM

## 2015-04-06 NOTE — Telephone Encounter (Signed)
Spoke with pt. He wishes to proceed with an auto titration study at home. Order placed. Sent to [facility].

## 2015-04-07 DIAGNOSIS — R0683 Snoring: Secondary | ICD-10-CM | POA: Insufficient documentation

## 2015-04-07 DIAGNOSIS — G478 Other sleep disorders: Secondary | ICD-10-CM | POA: Insufficient documentation

## 2015-05-17 ENCOUNTER — Telehealth: Payer: Self-pay

## 2015-05-17 NOTE — Telephone Encounter (Signed)
Received notice from [facility] that pt is not returning their phone calls with [id number] up cpap. I called pt, no answer, asked him to call me back.

## 2015-06-21 ENCOUNTER — Telehealth: Payer: Self-pay

## 2015-06-21 DIAGNOSIS — E119 Type 2 diabetes mellitus without complications: Secondary | ICD-10-CM

## 2015-06-21 NOTE — Telephone Encounter (Signed)
Pharm called to advise that metformin dose at ER 750, 2 tabs at breakfast and 2 in evening is over the max dose of 2550 mg total per day. She reported that she doesn't think that pt is taking it as Rxd anyway, because he picked it up in March and then in May, and not since. She tried to call pt and LM for him to call back. I also called pt to check his current use of metformin so that I can advise provider. LMOM to CB.

## 2015-06-22 MED ORDER — METFORMIN HCL ER 750 MG PO TB24: ORAL_TABLET | ORAL | Status: DC

## 2015-06-22 NOTE — Telephone Encounter (Signed)
Spoke to pt who stated that his doctor advised him at last OV to only take 2 tabs Qam of the metformin ER 750, along with his other DM meds. I will correct this in system to reflect current dose.

## 2015-08-02 LAB — HM DIABETES EYE EXAM

## 2015-09-15 ENCOUNTER — Encounter: Payer: Self-pay | Admitting: Family Medicine

## 2016-02-10 ENCOUNTER — Other Ambulatory Visit: Payer: Self-pay | Admitting: Physician Assistant

## 2016-02-11 MED FILL — METFORMIN HCL ER 750 MG TAB: 750 | 30 days supply | Qty: 60 | Fill #0

## 2016-02-12 ENCOUNTER — Telehealth: Payer: Self-pay | Admitting: *Deleted

## 2016-02-12 NOTE — Telephone Encounter (Signed)
Spoke with patient advised 15 tablets of medication will be called in.  He will need to come to walk-in clinic for additional refills  Patient understood

## 2016-02-14 MED FILL — GLIMEPIRIDE 4 MG TABLET: 4 | 7 days supply | Qty: 15 | Fill #0

## 2016-02-14 MED FILL — LISINOPRIL 10 MG TABLET: 10 | 15 days supply | Qty: 15 | Fill #0

## 2016-02-22 ENCOUNTER — Ambulatory Visit (INDEPENDENT_AMBULATORY_CARE_PROVIDER_SITE_OTHER): Payer: 59 | Admitting: Family Medicine

## 2016-02-22 VITALS — BP 144/79 | HR 94 | Temp 99.0°F | Resp 16 | Ht 70.0 in | Wt 255.8 lb

## 2016-02-22 DIAGNOSIS — IMO0002 Reserved for concepts with insufficient information to code with codable children: Secondary | ICD-10-CM

## 2016-02-22 DIAGNOSIS — E1136 Type 2 diabetes mellitus with diabetic cataract: Secondary | ICD-10-CM | POA: Diagnosis not present

## 2016-02-22 DIAGNOSIS — E1165 Type 2 diabetes mellitus with hyperglycemia: Secondary | ICD-10-CM

## 2016-02-22 DIAGNOSIS — E119 Type 2 diabetes mellitus without complications: Secondary | ICD-10-CM | POA: Diagnosis not present

## 2016-02-22 LAB — GLUCOSE, POCT (MANUAL RESULT ENTRY): POC GLUCOSE: 123 mg/dL — AB (ref 70–99)

## 2016-02-22 LAB — POCT GLYCOSYLATED HEMOGLOBIN (HGB A1C): Hemoglobin A1C: >=14

## 2016-02-22 NOTE — Patient Instructions (Addendum)
IF you received an x-ray today, you will receive an invoice from [facility]. Please contact [facility] at [phone] with questions or concerns regarding your invoice.   IF you received labwork today, you will receive an invoice from [employer]. Please contact [facility] at [phone] with questions or concerns regarding your invoice.   Our billing staff will not be able to assist you with questions regarding bills from these companies.  You will be contacted with the lab results as soon as they are available. The fastest way to get your results is to activate your My Chart account. Instructions are located on the last page of this paperwork. If you have not heard from [place] regarding the results in 2 weeks, please contact this office.    I will call you about the results tonight to determine med plan.   Call eye care provider for follow up, and recheck in 3 months for cholesterol/fasting tests and urine test for microalbumin.   Diabetes and Standards of Medical Care Diabetes is complicated. You may find that your diabetes team includes a dietitian, nurse, diabetes educator, eye doctor, and more. To help everyone know what is going on and to help you get the care you deserve, the following schedule of care was developed to help keep you on track. Below are the tests, exams, vaccines, medicines, education, and plans you will need. HbA1c test This test shows how well you have controlled your glucose over the past 2-3 months. It is used to see if your diabetes management plan needs to be adjusted.   It is performed at least 2 times a year if you are meeting treatment goals.  It is performed 4 times a year if therapy has changed or if you are not meeting treatment goals. Blood pressure test  This test is performed at every routine medical visit. The goal is less than 140/90 mm Hg for most people, but 130/80 mm Hg in some cases. Ask your health  care provider about your goal. Dental exam  Follow up with the dentist regularly. Eye exam  If you are diagnosed with type 1 diabetes as a child, get an exam upon reaching the age of 21 years or older and having had diabetes for 3-5 years. Yearly eye exams are recommended after that initial eye exam.  If you are diagnosed with type 1 diabetes as an adult, get an exam within 5 years of diagnosis and then yearly.  If you are diagnosed with type 2 diabetes, get an exam as soon as possible after the diagnosis and then yearly. Foot care exam  Visual foot exams are performed at every routine medical visit. The exams check for cuts, injuries, or other problems with the feet.  You should have a complete foot exam performed every year. This exam includes an inspection of the structure and skin of your feet, a check of the pulses in your feet, and a check of the sensation in your feet.  Type 1 diabetes: The first exam is performed 5 years after diagnosis.  Type 2 diabetes: The first exam is performed at the time of diagnosis.  Check your feet nightly for cuts, injuries, or other problems with your feet. Tell your health care provider if anything is not healing. Kidney function test (urine microalbumin)  This test is performed once a year.  Type 1 diabetes: The first test is performed 5 years after diagnosis.  Type 2 diabetes: The first test is performed  at the time of diagnosis.  A serum creatinine and estimated glomerular filtration rate (eGFR) test is done once a year to assess the level of chronic kidney disease (CKD), if present. Lipid profile (cholesterol, HDL, LDL, triglycerides)  Performed every 5 years for most people.  The goal for LDL is less than 100 mg/dL. If you are at high risk, the goal is less than 70 mg/dL.  The goal for HDL is 40 mg/dL-50 mg/dL for men and 50 mg/dL-60 mg/dL for women. An HDL cholesterol of 60 mg/dL or higher gives some protection against heart  disease.  The goal for triglycerides is less than 150 mg/dL. Immunizations  The flu (influenza) vaccine is recommended yearly for every person 40 months of age or older who has diabetes.  The pneumonia (pneumococcal) vaccine is recommended for every person 35 years of age or older who has diabetes. Adults 47 years of age or older may receive the pneumonia vaccine as a series of two separate shots.  The hepatitis B vaccine is recommended for adults shortly after they have been diagnosed with diabetes.  The Tdap (tetanus, diphtheria, and pertussis) vaccine should be given:  According to normal childhood vaccination schedules, for children.  Every 10 years, for adults who have diabetes. Diabetes self-management education  Education is recommended at diagnosis and ongoing as needed. Treatment plan  Your treatment plan is reviewed at every medical visit.   This information is not intended to replace advice given to you by your health care provider. Make sure you discuss any questions you have with your health care provider.   Document Released: 09/03/2009 Document Revised: 11/27/2014 Document Reviewed: 04/08/2013 Elsevier Interactive Patient Education [employer].

## 2016-02-22 NOTE — Progress Notes (Addendum)
Subjective:  By signing my name below, I, Ian Hughes, attest that this documentation has been prepared under the direction and in the presence of Ian Ray, MD. Electronically Signed: Moises Hughes, [place]. 02/22/2016 , 6:20 PM .  Patient was seen in Room 9 .   Patient ID: Ian Hughes, male    DOB: 1981-01-04, 35 y.o.   MRN: [id number] Chief Complaint  Patient presents with  . Diabetes    check on his diabetes   HPI Ian Hughes is a 35 y.o. male Here for diabetic follow up, last visit was in April 2016. He is on metformin, and A1c was 7.1 at that time. Planned follow up in 4 months for CPE. He is also on januvia 126m qd and amaryl 448mbid. He isn't fasting today.   He reports his home sugar readings have been running high, at 180-210. He denies missing any doses of medication. He states Dr. McLeward Hughes his [place] had him on metformin 750105m pills bid. He is still taking his amaryl 4mg30md. He had diabetic cataracts removed. He denies any other complications with his diabetes. He denies any symptomatic lows with his diabetes.   He hasn't seen an eye doctor in the past year.  He's seen a dentist recently.   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 UARS (upper airway resistance syndrome) 04/07/2015  . Severe obesity (BMI >= 40) (HCC) [place]18/2016  . Primary snoring 04/07/2015  . Snoring 12/21/2014  . Drowsiness 12/21/2014  . Witnessed apneic spells 12/21/2014  . ED (erectile dysfunction) 09/24/2012  . Diabetes mellitus type 2 in obese (HCC) [place]15/2010    Class: Chronic  . OBESITY, NOS 01/17/2007  . BELLS PALSY 01/17/2007  . HYPERTENSION, BENIGN SYSTEMIC 01/17/2007   Past Medical History  Diagnosis Date  . Cataract 2011    surgery on R eye  .  Diabetes mellitus without complication (HCC) [place] Obesity    Past Surgical History  Procedure Laterality Date  . Eye surgery Left 2011    cataract  . Eye surgery Right 2012   No Known Allergies Prior to Admission medications   Medication Sig Start Date End Date Taking? Authorizing Provider  Hughes Glucose Monitoring Suppl (Hughes GLUCOSE METER) kit Use to test Hughes sugar daily. 01/07/14  Yes BarbaBarton Fanny glimepiride (AMARYL) 4 MG tablet TAKE 1 TABLET BEFORE YOUR LARGEST MEAL AND 1 TABLET BEFORE SMALLER MEAL. 02/12/16  Yes Ian ScrapeA-C  glucose Hughes test strip Use to test fasting Hughes sugar daily. 01/07/14  Yes BarbaBarton Fanny Lancet Devices (LANCING DEVICE) MISC Use to test Hughes sugar daily. 01/07/14  Yes BarbaBarton Fanny Lancets MISC Use to test Hughes sugar daily. 01/07/14  Yes BarbaBarton Fanny lisinopril (PRINIVIL,ZESTRIL) 10 MG tablet TAKE 1 TABLET BY MOUTH DAILY. 02/12/16  Yes Ian ScrapeA-C  metFORMIN (GLUCOPHAGE-XR) 750 MG 24 hr tablet TAKE 2 TABLETS BY MOUTH WITH BREAKFAST. 06/22/15  Yes Ian Jeffery, PA-C  sitaGLIPtin (JANUVIA) 100 MG tablet Take 1 tablet (100 mg total) by mouth daily. Patient not taking: Reported on 02/22/2016 12/10/14   NicolEzekiel SlocumbC  tadalafil (CIALIS) 10 MG tablet Take 1  tablet (10 mg total) by mouth daily as needed for erectile dysfunction. Patient not taking: Reported on 02/22/2016 12/10/14   Tacy Dura   Social History   Social History  . Marital Status: Married    Spouse Name: N/A  . Number of Children: 1  . Years of Education: 12+   Occupational History  .  Ian Hughes   Social History Main Topics  . Smoking status: Never Smoker   . Smokeless tobacco: Never Used  . Alcohol Use: 0.0 oz/week    0 Standard drinks or equivalent per week     Comment: 1 or 2 every 3 months   . Drug Use: Not on file  . Sexual Activity: Not on file   Other Topics Concern  . Not on file   Social History  Narrative   Lives at home with wife and stepson.   Caffeine use: none   Has one child.   Review of Systems  Constitutional: Negative for fever, chills and fatigue.  Respiratory: Negative for cough, shortness of breath and wheezing.   Gastrointestinal: Negative for nausea and vomiting.  Skin: Negative for rash and wound.  Neurological: Negative for dizziness, weakness, numbness and headaches.      Objective:   Physical Exam  Constitutional: He is oriented to person, place, and time. He appears well-developed and well-nourished.  HENT:  Head: Normocephalic and atraumatic.  Eyes: EOM are normal. Pupils are equal, round, and reactive to light.  Neck: No JVD present. Carotid bruit is not present.  Cardiovascular: Normal rate, regular rhythm and normal heart sounds.   No murmur heard. Pulmonary/Chest: Effort normal and breath sounds normal. He has no rales.  Musculoskeletal: He exhibits no edema.  Neurological: He is alert and oriented to person, place, and time.  Skin: Skin is warm and dry.  Psychiatric: He has a normal mood and affect.  Vitals reviewed.   Filed Vitals:   02/22/16 1621  BP: 144/79  Pulse: 94  Temp: 99 F (37.2 C)  TempSrc: Oral  Resp: 16  Height: '5\' 10"'  (1.778 m)  Weight: 255 lb 12.8 oz (116.03 kg)  SpO2: 99%   Results for orders placed or performed in visit on 02/22/16  POCT glucose (manual entry)  Result Value Ref Range   POC Glucose 123 (A) 70 - 99 mg/dl  POCT glycosylated hemoglobin (Hb A1C)  Result Value Ref Range   Hemoglobin A1C >=14.0        Assessment & Plan:   Ian Hughes is a 35 y.o. male Uncontrolled type 2 diabetes mellitus with diabetic cataract, without long-term current use of insulin ([place]) - Plan: POCT glucose (manual entry), Basic metabolic panel, POCT glycosylated hemoglobin (Hb A1C)  Diabetes mellitus without complication (HCC)    - Uncontrolled by A1c, glucose in office closer to normal range. Initially suspected he was  off medications for some time and then restarted recently with short-term prescription, but on phone stated he was taking his friend's metformin 750 mg 2 pills twice per day, but not to Januvia and Amaryl. Still unsure how his A1c would be greater 14 if his readings were stable on that dose.    - change metformin 1000 mg twice a day (had been over recommended daily dose with the way he was taking 750 mg)  -restart Januvia 100 mg daily  -Amaryl 4 mg  once per day for now to lessen risk of hypoglycemia, but hypoglycemia precautions were discussed.   -advised to check Hughes  sugars frequently in the next 2 weeks, and recheck with these readings to determine if this is the right dosing regimen.   -if over 300 , or any symptomatic lows, return for recheck sooner here or in ER.   No orders of the defined types were placed in this encounter.   Patient Instructions       IF you received an x-Hughes today, you will receive an invoice from [facility]. Please contact [facility] at [phone] with questions or concerns regarding your invoice.   IF you received labwork today, you will receive an invoice from [employer]. Please contact [facility] at [phone] with questions or concerns regarding your invoice.   Our billing staff will not be able to assist you with questions regarding bills from these companies.  You will be contacted with the lab results as soon as they are available. The fastest way to get your results is to activate your My Chart account. Instructions are located on the last page of this paperwork. If you have not heard from [place] regarding the results in 2 weeks, please contact this office.    I will call you about the results tonight to determine med plan.   Call eye care provider for follow up, and recheck in 3 months for cholesterol/fasting tests and urine test for microalbumin.   Diabetes and Standards of Medical Care Diabetes is  complicated. You may find that your diabetes team includes a dietitian, nurse, diabetes educator, eye doctor, and more. To help everyone know what is going on and to help you get the care you deserve, the following schedule of care was developed to help keep you on track. Below are the tests, exams, vaccines, medicines, education, and plans you will need. HbA1c test This test shows how well you have controlled your glucose over the past 2-3 months. It is used to see if your diabetes management plan needs to be adjusted.   It is performed at least 2 times a year if you are meeting treatment goals.  It is performed 4 times a year if therapy has changed or if you are not meeting treatment goals. Hughes pressure test  This test is performed at every routine medical visit. The goal is less than 140/90 mm Hg for most people, but 130/80 mm Hg in some cases. Ask your health care provider about your goal. Dental exam  Follow up with the dentist regularly. Eye exam  If you are diagnosed with type 1 diabetes as a child, get an exam upon reaching the age of 63 years or older and having had diabetes for 3-5 years. Yearly eye exams are recommended after that initial eye exam.  If you are diagnosed with type 1 diabetes as an adult, get an exam within 5 years of diagnosis and then yearly.  If you are diagnosed with type 2 diabetes, get an exam as soon as possible after the diagnosis and then yearly. Foot care exam  Visual foot exams are performed at every routine medical visit. The exams check for cuts, injuries, or other problems with the feet.  You should have a complete foot exam performed every year. This exam includes an inspection of the structure and skin of your feet, a check of the pulses in your feet, and a check of the sensation in your feet.  Type 1 diabetes: The first exam is performed 5 years after diagnosis.  Type 2 diabetes: The first exam is performed at the time of diagnosis.  Check  your feet nightly for cuts, injuries, or other problems with your feet. Tell your health care provider if anything is not healing. Kidney function test (urine microalbumin)  This test is performed once a year.  Type 1 diabetes: The first test is performed 5 years after diagnosis.  Type 2 diabetes: The first test is performed at the time of diagnosis.  A serum creatinine and estimated glomerular filtration rate (eGFR) test is done once a year to assess the level of chronic kidney disease (CKD), if present. Lipid profile (cholesterol, HDL, LDL, triglycerides)  Performed every 5 years for most people.  The goal for LDL is less than 100 mg/dL. If you are at high risk, the goal is less than 70 mg/dL.  The goal for HDL is 40 mg/dL-50 mg/dL for men and 50 mg/dL-60 mg/dL for women. An HDL cholesterol of 60 mg/dL or higher gives some protection against heart disease.  The goal for triglycerides is less than 150 mg/dL. Immunizations  The flu (influenza) vaccine is recommended yearly for every person 37 months of age or older who has diabetes.  The pneumonia (pneumococcal) vaccine is recommended for every person 67 years of age or older who has diabetes. Adults 31 years of age or older may receive the pneumonia vaccine as a series of two separate shots.  The hepatitis B vaccine is recommended for adults shortly after they have been diagnosed with diabetes.  The Tdap (tetanus, diphtheria, and pertussis) vaccine should be given:  According to normal childhood vaccination schedules, for children.  Every 10 years, for adults who have diabetes. Diabetes self-management education  Education is recommended at diagnosis and ongoing as needed. Treatment plan  Your treatment plan is reviewed at every medical visit.   This information is not intended to replace advice given to you by your health care provider. Make sure you discuss any questions you have with your health care provider.   Document  Released: 09/03/2009 Document Revised: 11/27/2014 Document Reviewed: 04/08/2013 Elsevier Interactive Patient Education [employer].     I personally performed the services described in this documentation, which was scribed in my presence. The recorded information has been reviewed and considered, and addended by me as needed.

## 2016-02-23 LAB — BASIC METABOLIC PANEL
BUN: 13 mg/dL (ref 7–25)
CO2: 25 mmol/L (ref 20–31)
Calcium: 9.6 mg/dL (ref 8.6–10.3)
Chloride: 103 mmol/L (ref 98–110)
Creat: 0.95 mg/dL (ref 0.60–1.35)
Glucose, Bld: 104 mg/dL — ABNORMAL HIGH (ref 65–99)
POTASSIUM: 4.1 mmol/L (ref 3.5–5.3)
SODIUM: 136 mmol/L (ref 135–146)

## 2016-02-23 MED ORDER — GLIMEPIRIDE 4 MG PO TABS: 4.0000 mg | ORAL_TABLET | Freq: Every day | ORAL | Status: DC

## 2016-02-23 MED ORDER — SITAGLIPTIN PHOSPHATE 100 MG PO TABS: 100.0000 mg | ORAL_TABLET | Freq: Every day | ORAL | Status: DC

## 2016-02-23 MED ORDER — METFORMIN HCL 1000 MG PO TABS: 1000.0000 mg | ORAL_TABLET | Freq: Two times a day (BID) | ORAL | Status: DC

## 2016-03-06 ENCOUNTER — Other Ambulatory Visit: Payer: Self-pay | Admitting: Physician Assistant

## 2016-03-06 DIAGNOSIS — I10 Essential (primary) hypertension: Secondary | ICD-10-CM

## 2016-03-06 MED FILL — metFORMIN HCL 1000 MG TABS: 1000 | 90 days supply | Qty: 180 | Fill #0

## 2016-03-06 MED FILL — GLIMEPIRIDE 4 MG TABLET: 4 | 90 days supply | Qty: 90 | Fill #0

## 2016-03-07 MED FILL — JANUVIA 100 MG TABLET: 100 | 90 days supply | Qty: 90 | Fill #0

## 2016-03-07 NOTE — Telephone Encounter (Signed)
Dr Carlota Raspberry patient was just seen for a diabetic check can we refill Lisinopril didn't see a mention of it

## 2016-03-07 NOTE — Telephone Encounter (Signed)
Yes. Refilled, plan to follow-up in the next week or 2.

## 2016-03-08 MED FILL — LISINOPRIL 10 MG TABLET: 10 | 90 days supply | Qty: 90 | Fill #0

## 2016-03-08 NOTE — Telephone Encounter (Signed)
LM for pt advising him or RF and also need for f/up for DM.

## 2016-03-10 MED FILL — IBUPROFEN 800 MG TABLET: 800 | 4 days supply | Qty: 20 | Fill #1

## 2016-05-11 MED FILL — IBUPROFEN 800 MG TABLET: 800 | 4 days supply | Qty: 20 | Fill #2

## 2016-06-20 ENCOUNTER — Other Ambulatory Visit: Payer: Self-pay | Admitting: Family Medicine

## 2016-06-20 DIAGNOSIS — E1165 Type 2 diabetes mellitus with hyperglycemia: Principal | ICD-10-CM

## 2016-06-20 DIAGNOSIS — E1136 Type 2 diabetes mellitus with diabetic cataract: Secondary | ICD-10-CM

## 2016-06-20 DIAGNOSIS — IMO0002 Reserved for concepts with insufficient information to code with codable children: Secondary | ICD-10-CM

## 2016-06-20 DIAGNOSIS — I10 Essential (primary) hypertension: Secondary | ICD-10-CM

## 2016-06-21 ENCOUNTER — Other Ambulatory Visit: Payer: Self-pay

## 2016-06-21 DIAGNOSIS — IMO0002 Reserved for concepts with insufficient information to code with codable children: Secondary | ICD-10-CM

## 2016-06-21 DIAGNOSIS — E1136 Type 2 diabetes mellitus with diabetic cataract: Secondary | ICD-10-CM

## 2016-06-21 DIAGNOSIS — E1165 Type 2 diabetes mellitus with hyperglycemia: Principal | ICD-10-CM

## 2016-06-21 MED ORDER — GLIMEPIRIDE 4 MG PO TABS
4.0000 mg | ORAL_TABLET | Freq: Every day | ORAL | 0 refills | Status: DC
Start: 2016-06-21 — End: 2016-06-22

## 2016-06-22 ENCOUNTER — Encounter: Payer: Self-pay | Admitting: Family Medicine

## 2016-06-22 ENCOUNTER — Ambulatory Visit (INDEPENDENT_AMBULATORY_CARE_PROVIDER_SITE_OTHER): Payer: 59 | Admitting: Family Medicine

## 2016-06-22 VITALS — BP 110/74 | HR 83 | Temp 98.3°F | Resp 16 | Ht 70.0 in | Wt 262.8 lb

## 2016-06-22 DIAGNOSIS — Z23 Encounter for immunization: Secondary | ICD-10-CM | POA: Diagnosis not present

## 2016-06-22 DIAGNOSIS — Z0000 Encounter for general adult medical examination without abnormal findings: Secondary | ICD-10-CM | POA: Diagnosis not present

## 2016-06-22 DIAGNOSIS — I10 Essential (primary) hypertension: Secondary | ICD-10-CM

## 2016-06-22 DIAGNOSIS — Z113 Encounter for screening for infections with a predominantly sexual mode of transmission: Secondary | ICD-10-CM

## 2016-06-22 DIAGNOSIS — E1165 Type 2 diabetes mellitus with hyperglycemia: Secondary | ICD-10-CM | POA: Diagnosis not present

## 2016-06-22 DIAGNOSIS — IMO0002 Reserved for concepts with insufficient information to code with codable children: Secondary | ICD-10-CM

## 2016-06-22 DIAGNOSIS — Z13220 Encounter for screening for lipoid disorders: Secondary | ICD-10-CM | POA: Diagnosis not present

## 2016-06-22 DIAGNOSIS — E1136 Type 2 diabetes mellitus with diabetic cataract: Secondary | ICD-10-CM | POA: Diagnosis not present

## 2016-06-22 DIAGNOSIS — N529 Male erectile dysfunction, unspecified: Secondary | ICD-10-CM

## 2016-06-22 LAB — LIPID PANEL
Cholesterol: 156 mg/dL (ref 125–200)
HDL: 47 mg/dL (ref >=40)
LDL CALC: 98 mg/dL (ref <130)
Total CHOL/HDL Ratio: 3.3 Ratio (ref <=5.0)
Triglycerides: 56 mg/dL (ref <150)
VLDL: 11 mg/dL (ref <30)

## 2016-06-22 LAB — COMPLETE METABOLIC PANEL WITH GFR
ALT: 27 U/L (ref 9–46)
AST: 14 U/L (ref 10–40)
Albumin: 4 g/dL (ref 3.6–5.1)
Alkaline Phosphatase: 89 U/L (ref 40–115)
BILIRUBIN TOTAL: 0.5 mg/dL (ref 0.2–1.2)
BUN: 14 mg/dL (ref 7–25)
CHLORIDE: 102 mmol/L (ref 98–110)
CO2: 27 mmol/L (ref 20–31)
CREATININE: 0.98 mg/dL (ref 0.60–1.35)
Calcium: 9.1 mg/dL (ref 8.6–10.3)
GFR, Est African American: >89 mL/min (ref >=60)
GFR, Est Non African American: >89 mL/min (ref >=60)
GLUCOSE: 296 mg/dL — AB (ref 65–99)
Potassium: 4.7 mmol/L (ref 3.5–5.3)
SODIUM: 135 mmol/L (ref 135–146)
TOTAL PROTEIN: 6.7 g/dL (ref 6.1–8.1)

## 2016-06-22 LAB — MICROALBUMIN, URINE: Microalb, Ur: 0.8 mg/dL

## 2016-06-22 LAB — HIV ANTIBODY (ROUTINE TESTING W REFLEX): HIV: NONREACTIVE

## 2016-06-22 LAB — TESTOSTERONE: TESTOSTERONE: 325 ng/dL (ref 250–827)

## 2016-06-22 MED ORDER — SITAGLIPTIN PHOSPHATE 100 MG PO TABS: 100.0000 mg | ORAL_TABLET | Freq: Every day | ORAL | 1 refills | Status: DC

## 2016-06-22 MED ORDER — METFORMIN HCL 1000 MG PO TABS: 1000.0000 mg | ORAL_TABLET | Freq: Two times a day (BID) | ORAL | 1 refills | Status: DC

## 2016-06-22 MED ORDER — GLIMEPIRIDE 4 MG PO TABS: 4.0000 mg | ORAL_TABLET | Freq: Every day | ORAL | 1 refills | Status: DC

## 2016-06-22 MED ORDER — TADALAFIL 10 MG PO TABS: 5.0000 mg | ORAL_TABLET | Freq: Every day | ORAL | 1 refills | Status: DC | PRN

## 2016-06-22 MED ORDER — LISINOPRIL 10 MG PO TABS: 10.0000 mg | ORAL_TABLET | Freq: Every day | ORAL | 1 refills | Status: DC

## 2016-06-22 MED FILL — metFORMIN HCL 1000 MG TABS: 1000 | 90 days supply | Qty: 180 | Fill #0

## 2016-06-22 MED FILL — JANUVIA 100 MG TABLET: 100 | 90 days supply | Qty: 90 | Fill #0

## 2016-06-22 MED FILL — CIALIS 10 MG TABLET: 10 | 30 days supply | Qty: 6 | Fill #0

## 2016-06-22 MED FILL — LISINOPRIL 10 MG TABLET: 10 | 90 days supply | Qty: 90 | Fill #0

## 2016-06-22 MED FILL — GLIMEPIRIDE 4 MG TABLET: 4 | 90 days supply | Qty: 90 | Fill #0

## 2016-06-22 NOTE — Progress Notes (Signed)
By signing my name below, I, Mesha Guinyard, attest that this documentation has been prepared under the direction and in the presence of Merri Ray, MD.  Electronically Signed: Verlee Monte, Medical Scribe. 06/22/16. 9:38 AM.  Subjective:    Patient ID: Ian Hughes, male    DOB: 1981-08-16, 35 y.o.   MRN: [id number]  HPI Chief Complaint  Patient presents with  . Annual Exam    HPI Comments: Ian Hughes is a 35 y.o. male who presents to the [facility] for his annual complete physical. Last seen by me 02/22/16 for DM check, it was thought that he was off of medications for some time.  T2DM: His blood sugar was uncontrolled with diabetic cataracts without long-term use of insulin his last office visit. We increased his Metformin from 750 mg QD to 1000 mg BID, restarted Januvia 100 mg QD, and Amaryl 4 mg QD. He was advised to return in 2 weeks with home blood sugar readings. Pt's blood sugar readings have been in the low 100s at home. Pt mentions he's compliant with his DM medication. Pt states his blood sugar ran in the 200s when he misses meals and medications. Pt misses his blood sugar medications about 2-3 times a week because if he doesn't eat sufficient snack in the morning while taking his medication he'll feel horrible. Pt also misses his DM medication because he like to take them at the same time and he gets busy in the day. Pt doesn't check his blood sugar when he feels like this. Pt mentions it gets to the 300s if he doesn't take his medication at all. Pt takes Metformin and Januvia in the morning. Pt denies experiencing hypoglycemic symptoms.  Lab Results  Component Value Date   HGBA1C >=14.0 02/22/2016   Lab Results  Component Value Date   MICROALBUR 1.5 12/10/2014   HTN: He takes Lisinopril 10 mg QD  ED: Takes Cialis 10 mg every other time he has intercourse.  Immunizations: Pt has not had PNA vaccine and he doesn't remember when his last tetanus  vaccine was given. Immunization History  Administered Date(s) Administered  . Influenza Split 08/20/2012, 08/20/2013, 08/20/2014   Exercise: Pt exercises twice a week. Pt denies chest pain, and difficulty breathing.  Dentist: Was last seen 6 months from October.  Vision: Pt was told he has diabetic cataracts 5-6 years ago. Pt saw his ophthalmologist last year and they cleared his cataracts. Pt wasn't told he had diabetic retinopathy at that visit.  Visual Acuity Screening   Right eye Left eye Both eyes  Without correction: '20/50 20/25 20/25 '  With correction:      Depression: Pt mentions it's stressful at work with the new management. Pt mentions he's managing his stress well. Pt denies dysphoric mood. Depression screen [facility] 2/9 06/22/2016 02/22/2016 03/11/2015  Decreased Interest 0 0 0  Down, Depressed, Hopeless 0 0 0  PHQ - 2 Score 0 0 0   STI Testing: Pt would like to get STI screening. Pt denies having partners outside of marriage, and new exposure to STIs.  Patient Active Problem List   Diagnosis Date Noted  . UARS (upper airway resistance syndrome) 04/07/2015  . Severe obesity (BMI >= 40) ([place]) 04/07/2015  . Primary snoring 04/07/2015  . Snoring 12/21/2014  . Drowsiness 12/21/2014  . Witnessed apneic spells 12/21/2014  . ED (erectile dysfunction) 09/24/2012  . Diabetes mellitus type 2 in obese ([place]) 07/04/2009    Class: Chronic  .  OBESITY, NOS 01/17/2007  . BELLS PALSY 01/17/2007  . HYPERTENSION, BENIGN SYSTEMIC 01/17/2007   Past Medical History:  Diagnosis Date  . Cataract 2011   surgery on R eye  . Diabetes mellitus without complication ([place])   . Obesity    Past Surgical History:  Procedure Laterality Date  . EYE SURGERY Left 2011   cataract  . EYE SURGERY Right 2012   No Known Allergies Prior to Admission medications   Medication Sig Start Date End Date Taking? Authorizing Provider  Blood Glucose Monitoring Suppl (BLOOD GLUCOSE METER) kit Use to test blood  sugar daily. 01/07/14   Barton Fanny, MD  glimepiride (AMARYL) 4 MG tablet Take 1 tablet (4 mg total) by mouth daily with breakfast. 06/21/16   Wendie Agreste, MD  glucose blood test strip Use to test fasting blood sugar daily. 01/07/14   Barton Fanny, MD  Lancet Devices (LANCING DEVICE) MISC Use to test blood sugar daily. 01/07/14   Barton Fanny, MD  Lancets MISC Use to test blood sugar daily. 01/07/14   Barton Fanny, MD  lisinopril (PRINIVIL,ZESTRIL) 10 MG tablet TAKE 1 TABLET BY MOUTH DAILY. 03/07/16   Wendie Agreste, MD  metFORMIN (GLUCOPHAGE) 1000 MG tablet Take 1 tablet (1,000 mg total) by mouth 2 (two) times daily with a meal. 02/23/16   Wendie Agreste, MD  sitaGLIPtin (JANUVIA) 100 MG tablet Take 1 tablet (100 mg total) by mouth daily. 02/23/16   Wendie Agreste, MD  tadalafil (CIALIS) 10 MG tablet Take 1 tablet (10 mg total) by mouth daily as needed for erectile dysfunction. Patient not taking: Reported on 02/22/2016 12/10/14   Ezekiel Slocumb, PA-C   Social History   Social History  . Marital status: Married    Spouse name: N/A  . Number of children: 1  . Years of education: 12+   Occupational History  .  [employer]   Social History Main Topics  . Smoking status: Never Smoker  . Smokeless tobacco: Never Used  . Alcohol use 0.0 oz/week     Comment: 1 or 2 every 3 months   . Drug use: No  . Sexual activity: Not on file   Other Topics Concern  . Not on file   Social History Narrative   Lives at home with wife and stepson.   Caffeine use: none   Has one child.   Review of Systems  13 point ROS was negative. Objective:  Physical Exam  Constitutional: He is oriented to person, place, and time. He appears well-developed and well-nourished.  HENT:  Head: Normocephalic and atraumatic.  Right Ear: External ear normal.  Left Ear: External ear normal.  Mouth/Throat: Oropharynx is clear and moist.  Eyes: Conjunctivae and EOM are normal. Pupils are  equal, round, and reactive to light.  Neck: Normal range of motion. Neck supple. No thyromegaly present.  Cardiovascular: Normal rate, regular rhythm, normal heart sounds and intact distal pulses.   Pulmonary/Chest: Effort normal and breath sounds normal. No respiratory distress. He has no wheezes.  Abdominal: Soft. He exhibits no distension. There is no tenderness.  Musculoskeletal: Normal range of motion. He exhibits no edema or tenderness.  Lymphadenopathy:    He has no cervical adenopathy.  Neurological: He is alert and oriented to person, place, and time. He has normal reflexes.  Skin: Skin is warm and dry.  Psychiatric: He has a normal mood and affect. His behavior is normal.  Vitals reviewed.  BP 110/74 (  BP Location: Left Arm, Patient Position: Sitting, Cuff Size: Large)   Pulse 83   Temp 98.3 F (36.8 C) (Oral)   Resp 16   Ht '5\' 10"'  (1.778 m)   Wt 262 lb 12.8 oz (119.2 kg)   BMI 37.71 kg/m    Assessment & Plan:   Ian Hughes is a 35 y.o. male Annual physical exam  --anticipatory guidance as below in AVS, screening labs above. Health maintenance items as above in HPI discussed/recommended as applicable.   Erectile dysfunction, unspecified erectile dysfunction type - Plan: Testosterone, tadalafil (CIALIS) 10 MG tablet  -cilais Rx given - use lowest effective dose. Side effects discussed (including but not limited to headache/flushing, blue discoloration of vision, possible vascular steal and risk of cardiac effects if underlying unknown coronary artery disease, and permanent sensorineural hearing loss). Understanding expressed.  Uncontrolled type 2 diabetes mellitus with diabetic cataract, without long-term current use of insulin ([place]) - Plan: Hemoglobin A1C, Microalbumin, urine, glimepiride (AMARYL) 4 MG tablet, metFORMIN (GLUCOPHAGE) 1000 MG tablet, sitaGLIPtin (JANUVIA) 100 MG tablet  -Stressed taking Amaryl with first large meal a day. Increase exercise, diet changes for  improved diabetic control. A1c pending. Continue Januvia, Amaryl, metforminsame dose for now.  Screening for hyperlipidemia - Plan: COMPLETE METABOLIC PANEL WITH GFR, Lipid panel  Routine screening for STI (sexually transmitted infection) - Plan: RPR, HIV antibody, GC/Chlamydia Probe Amp  Essential hypertension - Plan: lisinopril (PRINIVIL,ZESTRIL) 10 MG tablet  -Stable. No med changes.  Need for Tdap vaccination  -Tdap given  Need for prophylactic vaccination against Streptococcus pneumoniae (pneumococcus)  -Pneumovax given  Stress/work stressors. Feels that he is doing well with this. Stress and stress management handout given, RTC precautions.  Recheck 3 months  Meds ordered this encounter  Medications  . glimepiride (AMARYL) 4 MG tablet    Sig: Take 1 tablet (4 mg total) by mouth daily with breakfast. (or first meal of the day)    Dispense:  90 tablet    Refill:  1  . lisinopril (PRINIVIL,ZESTRIL) 10 MG tablet    Sig: Take 1 tablet (10 mg total) by mouth daily.    Dispense:  90 tablet    Refill:  1  . metFORMIN (GLUCOPHAGE) 1000 MG tablet    Sig: Take 1 tablet (1,000 mg total) by mouth 2 (two) times daily with a meal.    Dispense:  180 tablet    Refill:  1  . sitaGLIPtin (JANUVIA) 100 MG tablet    Sig: Take 1 tablet (100 mg total) by mouth daily.    Dispense:  90 tablet    Refill:  1  . tadalafil (CIALIS) 10 MG tablet    Sig: Take 0.5-1 tablets (5-10 mg total) by mouth daily as needed for erectile dysfunction.    Dispense:  10 tablet    Refill:  1   Patient Instructions   Pneumonia vaccine and tetanus vaccine were given today.  Make sure you're taking medication every day as instructed. If you do not have a large meal in morning, can take Amaryl with lunch. Depending on your hemoglobin A1c, may need to follow back up to discuss  medications further.  Exercise most if not all days a week. See information below on stress and stress management.    Return to the  clinic or go to the nearest emergency room if any of your symptoms worsen or new symptoms occur.   Keeping you healthy  Get these tests  Blood pressure- Have your blood  pressure checked once a year by your healthcare provider.  Normal blood pressure is 120/80.  Weight- Have your body mass index (BMI) calculated to screen for obesity.  BMI is a measure of body fat based on height and weight. You can also calculate your own BMI at [employer]velB[employer].it.  Cholesterol- Have your cholesterol checked regularly starting at age 48, sooner may be necessary if you have diabetes, high blood pressure, if a family member developed heart diseases at an early age or if you smoke.   Chlamydia, HIV, and other sexual transmitted disease- Get screened each year until the age of 59 then within three months of each new sexual partner.  Diabetes- Have your blood sugar checked regularly if you have high blood pressure, high cholesterol, a family history of diabetes or if you are overweight.  Get these vaccines  Flu shot- Every fall.  Tetanus shot- Every 10 years.  Menactra- Single dose; prevents meningitis.  Take these steps  Don't smoke- If you do smoke, ask your healthcare provider about quitting. For tips on how to quit, go to www.smokefree.gov or call 1-800-QUIT-NOW.  Be physically active- Exercise 5 days a week for at least 30 minutes.  If you are not already physically active start slow and gradually work up to 30 minutes of moderate physical activity.  Examples of moderate activity include walking briskly, mowing the yard, dancing, swimming bicycling, etc.  Eat a healthy diet- Eat a variety of healthy foods such as fruits, vegetables, low fat milk, low fat cheese, yogurt, lean meats, poultry, fish, beans, tofu, etc.  For more information on healthy eating, go to www.thenutritionsource.org  Drink alcohol in moderation- Limit alcohol intake two drinks or less a day.  Never drink and  drive.  Dentist- Brush and floss teeth twice daily; visit your dentis twice a year.  Depression-Your emotional health is as important as your physical health.  If you're feeling down, losing interest in things you normally enjoy please talk with your healthcare provider.  Gun Safety- If you keep a gun in your home, keep it unloaded and with the safety lock on.  Bullets should be stored separately.  Helmet use- Always wear a helmet when riding a motorcycle, bicycle, rollerblading or skateboarding.  Safe sex- If you may be exposed to a sexually transmitted infection, use a condom  Seat belts- Seat bels can save your life; always wear one.  Smoke/Carbon Monoxide detectors- These detectors need to be installed on the appropriate level of your home.  Replace batteries at least once a year.  Skin Cancer- When out in the sun, cover up and use sunscreen SPF 15 or higher.  Violence- If anyone is threatening or hurting you, please tell your healthcare provider.  Stress and Stress Management Stress is a normal reaction to life events. It is what you feel when life demands more than you are used to or more than you can handle. Some stress can be useful. For example, the stress reaction can help you catch the last bus of the day, study for a test, or meet a deadline at work. But stress that occurs too often or for too long can cause problems. It can affect your emotional health and interfere with relationships and normal daily activities. Too much stress can weaken your immune system and increase your risk for physical illness. If you already have a medical problem, stress can make it worse. CAUSES  All sorts of life events may cause stress. An event that causes stress for  one person may not be stressful for another person. Major life events commonly cause stress. These may be positive or negative. Examples include losing your job, moving into a new home, getting married, having a baby, or losing a loved  one. Less obvious life events may also cause stress, especially if they occur day after day or in combination. Examples include working long hours, driving in traffic, caring for children, being in debt, or being in a difficult relationship. SIGNS AND SYMPTOMS Stress may cause emotional symptoms including, the following:  Anxiety. This is feeling worried, afraid, on edge, overwhelmed, or out of control.  Anger. This is feeling irritated or impatient.  Depression. This is feeling sad, down, helpless, or guilty.  Difficulty focusing, remembering, or making decisions. Stress may cause physical symptoms, including the following:   Aches and pains. These may affect your head, neck, back, stomach, or other areas of your body.  Tight muscles or clenched jaw.  Low energy or trouble sleeping. Stress may cause unhealthy behaviors, including the following:   Eating to feel better (overeating) or skipping meals.  Sleeping too little, too much, or both.  Working too much or putting off tasks (procrastination).  Smoking, drinking alcohol, or using drugs to feel better. DIAGNOSIS  Stress is diagnosed through an assessment by your health care provider. Your health care provider will ask questions about your symptoms and any stressful life events.Your health care provider will also ask about your medical history and may order blood tests or other tests. Certain medical conditions and medicine can cause physical symptoms similar to stress. Mental illness can cause emotional symptoms and unhealthy behaviors similar to stress. Your health care provider may refer you to a mental health professional for further evaluation.  TREATMENT  Stress management is the recommended treatment for stress.The goals of stress management are reducing stressful life events and coping with stress in healthy ways.  Techniques for reducing stressful life events include the following:  Stress identification. Self-monitor  for stress and identify what causes stress for you. These skills may help you to avoid some stressful events.  Time management. Set your priorities, keep a calendar of events, and learn to say "no." These tools can help you avoid making too many commitments. Techniques for coping with stress include the following:  Rethinking the problem. Try to think realistically about stressful events rather than ignoring them or overreacting. Try to find the positives in a stressful situation rather than focusing on the negatives.  Exercise. Physical exercise can release both physical and emotional tension. The key is to find a form of exercise you enjoy and do it regularly.  Relaxation techniques. These relax the body and mind. Examples include yoga, meditation, tai chi, biofeedback, deep breathing, progressive muscle relaxation, listening to music, being out in nature, journaling, and other hobbies. Again, the key is to find one or more that you enjoy and can do regularly.  Healthy lifestyle. Eat a balanced diet, get plenty of sleep, and do not smoke. Avoid using alcohol or drugs to relax.  Strong support network. Spend time with family, friends, or other people you enjoy being around.Express your feelings and talk things over with someone you trust. Counseling or talktherapy with a mental health professional may be helpful if you are having difficulty managing stress on your own. Medicine is typically not recommended for the treatment of stress.Talk to your health care provider if you think you need medicine for symptoms of stress. HOME CARE INSTRUCTIONS  Keep all  follow-up visits as directed by your health care provider.  Take all medicines as directed by your health care provider. SEEK MEDICAL CARE IF:  Your symptoms get worse or you start having new symptoms.  You feel overwhelmed by your problems and can no longer manage them on your own. SEEK IMMEDIATE MEDICAL CARE IF:  You feel like hurting  yourself or someone else.   This information is not intended to replace advice given to you by your health care provider. Make sure you discuss any questions you have with your health care provider.   Document Released: 05/02/2001 Document Revised: 11/27/2014 Document Reviewed: 07/01/2013 Elsevier Interactive Patient Education [employer].   IF you received an x-ray today, you will receive an invoice from [facility]. Please contact [facility] at [phone] with questions or concerns regarding your invoice.   IF you received labwork today, you will receive an invoice from [employer]. Please contact [facility] at [phone] with questions or concerns regarding your invoice.   Our billing staff will not be able to assist you with questions regarding bills from these companies.  You will be contacted with the lab results as soon as they are available. The fastest way to get your results is to activate your My Chart account. Instructions are located on the last page of this paperwork. If you have not heard from [place] regarding the results in 2 weeks, please contact this office.       I personally performed the services described in this documentation, which was scribed in my presence. The recorded information has been reviewed and considered, and addended by me as needed.   Signed,   Merri Ray, MD [facility].  06/24/16 10:33 PM

## 2016-06-22 NOTE — Patient Instructions (Addendum)
Pneumonia vaccine and tetanus vaccine were given today.  Make sure you're taking medication every day as instructed. If you do not have a large meal in morning, can take Amaryl with lunch. Depending on your hemoglobin A1c, may need to follow back up to discuss  medications further.  Exercise most if not all days a week. See information below on stress and stress management.    Return to the clinic or go to the nearest emergency room if any of your symptoms worsen or new symptoms occur.   Keeping you healthy  Get these tests  Blood pressure- Have your blood pressure checked once a year by your healthcare provider.  Normal blood pressure is 120/80.  Weight- Have your body mass index (BMI) calculated to screen for obesity.  BMI is a measure of body fat based on height and weight. You can also calculate your own BMI at [employer].it.  Cholesterol- Have your cholesterol checked regularly starting at age 51, sooner may be necessary if you have diabetes, high blood pressure, if a family member developed heart diseases at an early age or if you smoke.   Chlamydia, HIV, and other sexual transmitted disease- Get screened each year until the age of 77 then within three months of each new sexual partner.  Diabetes- Have your blood sugar checked regularly if you have high blood pressure, high cholesterol, a family history of diabetes or if you are overweight.  Get these vaccines  Flu shot- Every fall.  Tetanus shot- Every 10 years.  Menactra- Single dose; prevents meningitis.  Take these steps  Don't smoke- If you do smoke, ask your healthcare provider about quitting. For tips on how to quit, go to www.smokefree.gov or call 1-800-QUIT-NOW.  Be physically active- Exercise 5 days a week for at least 30 minutes.  If you are not already physically active start slow and gradually work up to 30 minutes of moderate physical activity.  Examples of moderate activity include walking briskly,  mowing the yard, dancing, swimming bicycling, etc.  Eat a healthy diet- Eat a variety of healthy foods such as fruits, vegetables, low fat milk, low fat cheese, yogurt, lean meats, poultry, fish, beans, tofu, etc.  For more information on healthy eating, go to www.thenutritionsource.org  Drink alcohol in moderation- Limit alcohol intake two drinks or less a day.  Never drink and drive.  Dentist- Brush and floss teeth twice daily; visit your dentis twice a year.  Depression-Your emotional health is as important as your physical health.  If you're feeling down, losing interest in things you normally enjoy please talk with your healthcare provider.  Gun Safety- If you keep a gun in your home, keep it unloaded and with the safety lock on.  Bullets should be stored separately.  Helmet use- Always wear a helmet when riding a motorcycle, bicycle, rollerblading or skateboarding.  Safe sex- If you may be exposed to a sexually transmitted infection, use a condom  Seat belts- Seat bels can save your life; always wear one.  Smoke/Carbon Monoxide detectors- These detectors need to be installed on the appropriate level of your home.  Replace batteries at least once a year.  Skin Cancer- When out in the sun, cover up and use sunscreen SPF 15 or higher.  Violence- If anyone is threatening or hurting you, please tell your healthcare provider.  Stress and Stress Management Stress is a normal reaction to life events. It is what you feel when life demands more than you are used to or  more than you can handle. Some stress can be useful. For example, the stress reaction can help you catch the last bus of the day, study for a test, or meet a deadline at work. But stress that occurs too often or for too long can cause problems. It can affect your emotional health and interfere with relationships and normal daily activities. Too much stress can weaken your immune system and increase your risk for physical illness.  If you already have a medical problem, stress can make it worse. CAUSES  All sorts of life events may cause stress. An event that causes stress for one person may not be stressful for another person. Major life events commonly cause stress. These may be positive or negative. Examples include losing your job, moving into a new home, getting married, having a baby, or losing a loved one. Less obvious life events may also cause stress, especially if they occur day after day or in combination. Examples include working long hours, driving in traffic, caring for children, being in debt, or being in a difficult relationship. SIGNS AND SYMPTOMS Stress may cause emotional symptoms including, the following:  Anxiety. This is feeling worried, afraid, on edge, overwhelmed, or out of control.  Anger. This is feeling irritated or impatient.  Depression. This is feeling sad, down, helpless, or guilty.  Difficulty focusing, remembering, or making decisions. Stress may cause physical symptoms, including the following:   Aches and pains. These may affect your head, neck, back, stomach, or other areas of your body.  Tight muscles or clenched jaw.  Low energy or trouble sleeping. Stress may cause unhealthy behaviors, including the following:   Eating to feel better (overeating) or skipping meals.  Sleeping too little, too much, or both.  Working too much or putting off tasks (procrastination).  Smoking, drinking alcohol, or using drugs to feel better. DIAGNOSIS  Stress is diagnosed through an assessment by your health care provider. Your health care provider will ask questions about your symptoms and any stressful life events.Your health care provider will also ask about your medical history and may order blood tests or other tests. Certain medical conditions and medicine can cause physical symptoms similar to stress. Mental illness can cause emotional symptoms and unhealthy behaviors similar to stress.  Your health care provider may refer you to a mental health professional for further evaluation.  TREATMENT  Stress management is the recommended treatment for stress.The goals of stress management are reducing stressful life events and coping with stress in healthy ways.  Techniques for reducing stressful life events include the following:  Stress identification. Self-monitor for stress and identify what causes stress for you. These skills may help you to avoid some stressful events.  Time management. Set your priorities, keep a calendar of events, and learn to say "no." These tools can help you avoid making too many commitments. Techniques for coping with stress include the following:  Rethinking the problem. Try to think realistically about stressful events rather than ignoring them or overreacting. Try to find the positives in a stressful situation rather than focusing on the negatives.  Exercise. Physical exercise can release both physical and emotional tension. The key is to find a form of exercise you enjoy and do it regularly.  Relaxation techniques. These relax the body and mind. Examples include yoga, meditation, tai chi, biofeedback, deep breathing, progressive muscle relaxation, listening to music, being out in nature, journaling, and other hobbies. Again, the key is to find one or more that you  enjoy and can do regularly.  Healthy lifestyle. Eat a balanced diet, get plenty of sleep, and do not smoke. Avoid using alcohol or drugs to relax.  Strong support network. Spend time with family, friends, or other people you enjoy being around.Express your feelings and talk things over with someone you trust. Counseling or talktherapy with a mental health professional may be helpful if you are having difficulty managing stress on your own. Medicine is typically not recommended for the treatment of stress.Talk to your health care provider if you think you need medicine for symptoms of  stress. HOME CARE INSTRUCTIONS  Keep all follow-up visits as directed by your health care provider.  Take all medicines as directed by your health care provider. SEEK MEDICAL CARE IF:  Your symptoms get worse or you start having new symptoms.  You feel overwhelmed by your problems and can no longer manage them on your own. SEEK IMMEDIATE MEDICAL CARE IF:  You feel like hurting yourself or someone else.   This information is not intended to replace advice given to you by your health care provider. Make sure you discuss any questions you have with your health care provider.   Document Released: 05/02/2001 Document Revised: 11/27/2014 Document Reviewed: 07/01/2013 Elsevier Interactive Patient Education [employer].   IF you received an x-ray today, you will receive an invoice from [facility]. Please contact [facility] at [phone] with questions or concerns regarding your invoice.   IF you received labwork today, you will receive an invoice from [employer]. Please contact [facility] at [phone] with questions or concerns regarding your invoice.   Our billing staff will not be able to assist you with questions regarding bills from these companies.  You will be contacted with the lab results as soon as they are available. The fastest way to get your results is to activate your My Chart account. Instructions are located on the last page of this paperwork. If you have not heard from [place] regarding the results in 2 weeks, please contact this office.

## 2016-06-23 LAB — RPR

## 2016-06-23 LAB — GC/CHLAMYDIA PROBE AMP
CT Probe RNA: NOT DETECTED
GC Probe RNA: NOT DETECTED

## 2016-06-23 LAB — HEMOGLOBIN A1C
HEMOGLOBIN A1C: 10.1 % — AB (ref <5.7)
MEAN PLASMA GLUCOSE: 243 mg/dL

## 2016-06-24 ENCOUNTER — Encounter: Payer: Self-pay | Admitting: Family Medicine

## 2016-06-28 ENCOUNTER — Telehealth: Payer: Self-pay | Admitting: Emergency Medicine

## 2016-06-28 NOTE — Telephone Encounter (Signed)
-----   Message from Wendie Agreste, MD sent at 06/28/2016  4:25 PM EDT ----- Call patient:  STI tests were negative or normal including nonreactive syphilis tests, nonreactive HIV antibody, negative chlamydia and gonorrhea tests. Cholesterol looked okay, test for protein spilling in the urine was normal. Testosterone level was normal, kidney tests, liver function tests, electrolytes were overall in normal range.  Hemoglobin A1c was elevated at 10.1. This needs to be below 7. We can continue the same dose of medication if he can take it every day as discussed last visit. Follow-up with me in 3 months, or sooner if he is obtaining home blood sugar readings over 200.

## 2016-09-13 DIAGNOSIS — Z0100 Encounter for examination of eyes and vision without abnormal findings: Secondary | ICD-10-CM | POA: Diagnosis not present

## 2016-10-02 MED FILL — metFORMIN HCL 1000 MG TABS: 1000 | 90 days supply | Qty: 180 | Fill #1

## 2016-10-02 MED FILL — GLIMEPIRIDE 4 MG TABLET: 4 | 90 days supply | Qty: 90 | Fill #1

## 2016-10-02 MED FILL — LISINOPRIL 10 MG TABLET: 10 | 90 days supply | Qty: 90 | Fill #1

## 2016-10-02 MED FILL — JANUVIA 100 MG TABLET: 100 | 90 days supply | Qty: 90 | Fill #1

## 2016-12-11 ENCOUNTER — Emergency Department (HOSPITAL_COMMUNITY): Payer: 59

## 2016-12-11 ENCOUNTER — Encounter (HOSPITAL_COMMUNITY): Payer: Self-pay | Admitting: Emergency Medicine

## 2016-12-11 ENCOUNTER — Emergency Department (HOSPITAL_COMMUNITY)
Admission: EM | Admit: 2016-12-11 | Discharge: 2016-12-12 | Disposition: A | Discharge status: Home / Self Care | Payer: 59 | Attending: Emergency Medicine | Admitting: Emergency Medicine

## 2016-12-11 DIAGNOSIS — E119 Type 2 diabetes mellitus without complications: Secondary | ICD-10-CM | POA: Insufficient documentation

## 2016-12-11 DIAGNOSIS — M25472 Effusion, left ankle: Secondary | ICD-10-CM | POA: Diagnosis not present

## 2016-12-11 DIAGNOSIS — I10 Essential (primary) hypertension: Secondary | ICD-10-CM | POA: Insufficient documentation

## 2016-12-11 DIAGNOSIS — M25572 Pain in left ankle and joints of left foot: Secondary | ICD-10-CM | POA: Diagnosis not present

## 2016-12-11 DIAGNOSIS — Z7984 Long term (current) use of oral hypoglycemic drugs: Secondary | ICD-10-CM | POA: Diagnosis not present

## 2016-12-11 DIAGNOSIS — M7989 Other specified soft tissue disorders: Secondary | ICD-10-CM | POA: Diagnosis not present

## 2016-12-11 DIAGNOSIS — Z79899 Other long term (current) drug therapy: Secondary | ICD-10-CM | POA: Insufficient documentation

## 2016-12-11 HISTORY — DX: Pure hypercholesterolemia, unspecified: E78.00

## 2016-12-11 NOTE — ED Triage Notes (Signed)
Pt reports left ankle swelling starting about a week ago. Pt denies any trauma. Pt ambulatory. No edema noted.

## 2016-12-12 LAB — CBG MONITORING, ED: Glucose-Capillary: 330 mg/dL — ABNORMAL HIGH (ref 65–99)

## 2016-12-12 LAB — D-DIMER, QUANTITATIVE: D-Dimer, Quant: <0.27 ug/mL-FEU (ref 0.00–0.50)

## 2016-12-12 MED ORDER — CEPHALEXIN 500 MG PO CAPS: 500.0000 mg | ORAL_CAPSULE | Freq: Four times a day (QID) | ORAL | 0 refills | Status: AC

## 2016-12-12 MED FILL — CEPHALEXIN 500 MG CAPSULE: 500 | 5 days supply | Qty: 20 | Fill #0

## 2016-12-12 NOTE — ED Provider Notes (Signed)
[facility]-EMERGENCY DEPT Provider Note   CSN: [id number] Arrival date & time: 12/11/16  1941     History   Chief Complaint Chief Complaint  Patient presents with  . Joint Swelling    HPI Ian Hughes is a 36 y.o. male.  HPI  Past Medical History:  Diagnosis Date  . Cataract 2011   surgery on R eye  . Diabetes mellitus without complication (HCC)   . High cholesterol   . Obesity     Patient Active Problem List   Diagnosis Date Noted  . UARS (upper airway resistance syndrome) 04/07/2015  . Severe obesity (BMI >= 40) (HCC) 04/07/2015  . Primary snoring 04/07/2015  . Snoring 12/21/2014  . Drowsiness 12/21/2014  . Witnessed apneic spells 12/21/2014  . ED (erectile dysfunction) 09/24/2012  . Diabetes mellitus type 2 in obese (HCC) 07/04/2009    Class: Chronic  . OBESITY, NOS 01/17/2007  . BELLS PALSY 01/17/2007  . HYPERTENSION, BENIGN SYSTEMIC 01/17/2007    Past Surgical History:  Procedure Laterality Date  . EYE SURGERY Left 2011   cataract  . EYE SURGERY Right 2012       Home Medications    Prior to Admission medications   Medication Sig Start Date End Date Taking? Authorizing Provider  Blood Glucose Monitoring Suppl (BLOOD GLUCOSE METER) kit Use to test blood sugar daily. 01/07/14   Barbara B McPherson, MD  cephALEXin (KEFLEX) 500 MG capsule Take 1 capsule (500 mg total) by mouth 4 (four) times daily. 12/12/16 12/17/16  Claudia J Gibbons, PA-C  glimepiride (AMARYL) 4 MG tablet Take 1 tablet (4 mg total) by mouth daily with breakfast. (or first meal of the day) 06/22/16   Jeffrey R Greene, MD  glucose blood test strip Use to test fasting blood sugar daily. 01/07/14   Barbara B McPherson, MD  Lancet Devices (LANCING DEVICE) MISC Use to test blood sugar daily. 01/07/14   Barbara B McPherson, MD  Lancets MISC Use to test blood sugar daily. Patient not taking: Reported on 06/22/2016 01/07/14   Barbara B McPherson, MD  lisinopril (PRINIVIL,ZESTRIL) 10 MG tablet Take 1  tablet (10 mg total) by mouth daily. 06/22/16   Jeffrey R Greene, MD  metFORMIN (GLUCOPHAGE) 1000 MG tablet Take 1 tablet (1,000 mg total) by mouth 2 (two) times daily with a meal. 06/22/16   Jeffrey R Greene, MD  sitaGLIPtin (JANUVIA) 100 MG tablet Take 1 tablet (100 mg total) by mouth daily. 06/22/16   Jeffrey R Greene, MD  tadalafil (CIALIS) 10 MG tablet Take 0.5-1 tablets (5-10 mg total) by mouth daily as needed for erectile dysfunction. 06/22/16   Jeffrey R Greene, MD    Family History Family History  Problem Relation Age of Onset  . Diabetes Mother   . Cancer Father     leukemia   . Obesity Other     Social History Social History  Substance Use Topics  . Smoking status: Never Smoker  . Smokeless tobacco: Never Used  . Alcohol use 0.0 oz/week     Comment: 1 or 2 every 3 months      Allergies   Patient has no known allergies.   Review of Systems Review of Systems  Constitutional: Positive for activity change (limping due to L lower extremity pain). Negative for chills and fever.  HENT: Negative for congestion and sore throat.   Eyes: Negative for visual disturbance.  Respiratory: Negative for cough, chest tightness and shortness of breath.   Cardiovascular: Positive for leg swelling (  L ankle and L foot). Negative for chest pain.  Gastrointestinal: Negative for abdominal pain, constipation, diarrhea, nausea and vomiting.  Genitourinary: Negative for decreased urine volume and difficulty urinating.  Musculoskeletal: Positive for arthralgias (L ankle), gait problem and joint swelling (L ankle).  Skin: Negative for rash.  Allergic/Immunologic: Positive for immunocompromised state (T2DM).  Neurological: Negative for dizziness, tremors, weakness, light-headedness, numbness and headaches.     Physical Exam Updated Vital Signs BP 140/87   Pulse 81   Temp 98.2 F (36.8 C) (Oral)   Resp 16   Ht 5' 10" (1.778 m)   Wt 125.6 kg   SpO2 99%   BMI 39.75 kg/m   Physical Exam    Constitutional: He is oriented to person, place, and time. He appears well-developed and well-nourished. No distress.  NAD.  HENT:  Head: Normocephalic and atraumatic.  Right Ear: External ear normal.  Left Ear: External ear normal.  Nose: Nose normal.  Mouth/Throat: Oropharynx is clear and moist. No oropharyngeal exudate.  Moist mucous membranes.  No nasal mucosa edema. Oropharynx and tonsils pink without erythema, edema, vesicles or exudates. Uvula midline. No trismus.   Eyes: Conjunctivae and EOM are normal. Pupils are equal, round, and reactive to light. No scleral icterus.  Neck: Normal range of motion. Neck supple. No JVD present. No tracheal deviation present.  Cardiovascular: Normal rate, regular rhythm, normal heart sounds and intact distal pulses.   No murmur heard. Pulmonary/Chest: Effort normal and breath sounds normal. He has no wheezes.  Abdominal: Soft. He exhibits no distension. There is no tenderness.  No CVAT bilaterally.  No suprapubic tenderness. Negative Murphy's. Negative McBurney's. Negative Psoas sign.   Musculoskeletal: Normal range of motion. He exhibits no deformity.  Full ROM at bilateral ankles without reported pain.  Full extension and flexion of toes bilaterally.  There is mild edema and tenderness over L medial malleolus.  Mild L mid foot edema.  Lymphadenopathy:    He has no cervical adenopathy.  Neurological: He is alert and oriented to person, place, and time.  5/5 strength at bilateral ankles.  Sensation to light touch decreased at bottom of MTP bilaterally only.   Skin: Skin is warm and dry. Capillary refill takes less than 2 seconds.  Psychiatric: He has a normal mood and affect. His behavior is normal. Judgment and thought content normal.  Nursing note and vitals reviewed.    ED Treatments / Results  Labs (all labs ordered are listed, but only abnormal results are displayed) Labs Reviewed  CBG MONITORING, ED - Abnormal; Notable for  the following:       Result Value   Glucose-Capillary 330 (*)    All other components within normal limits  D-DIMER, QUANTITATIVE (NOT AT [facility])    EKG  EKG Interpretation None       Radiology Dg Ankle Complete Left  Result Date: 12/11/2016 CLINICAL DATA:  Pain and swelling for 1 week, no known injury, initial encounter EXAM: LEFT ANKLE COMPLETE - 3+ VIEW COMPARISON:  None. FINDINGS: Generalized soft tissue swelling is noted more prominent medially than laterally. No acute fracture or dislocation is noted. IMPRESSION: Mild soft tissue swelling without acute bony abnormality. Electronically Signed   By: Inez Catalina M.D.   On: 12/11/2016 21:12    Procedures Procedures (including critical care time)  Medications Ordered in ED Medications - No data to display   Initial Impression / Assessment and Plan / ED Course  I have reviewed the triage vital signs and the  nursing notes.  Pertinent labs & imaging results that were available during my care of the patient were reviewed by me and considered in my medical decision making (see chart for details).  Clinical Course as of Dec 13 203  Tue Dec 12, 2016  0115 afebrile Temp: 98.2 F (36.8 C) [CG]  0115 IMPRESSION: Mild soft tissue swelling without acute bony abnormality DG Ankle Complete Left [CG]  0115 Non fasting CBG elevated. No polydipsia, polyuria, nausea, abdominal pain. Doubt diabetic emergency. Pt's last A1c was 10. Likely chronically elevated CBG due to poor control Glucose-Capillary: (!) 330 [CG]  0117 Welles score for DVT = 1, low pre-test probability  [CG]  0138 Negative d-dimer D-Dimer, Quant: <0.27 [CG]    Clinical Course User Index [CG] Kinnie Feil, PA-C    36 yo male with pmh of poorly controlled, non insulin dependent T2DM, HTN, obesity, hyperlipidemia and erectile dysfunction presents with L medial malleolus tenderness, L ankle and L midfoot edema associated with medial thigh and medial calf "tightness" x 1  week. Initial differential diagnosis includes trauma, DVT, gout, PVD, PAD.  Less likely septic arthritis.  No previous h/o trauma. Ankle x-ray remarkable for mild soft tissue swelling without bony abnormality.  Welles score for DVT =1, low pre-test probability. D-dimer negative. Likely not a DVT.  Ultrasound not indicated at this time given welles score and negative d-dimer.  Patient will be discharged with antibiotics to cover for cellulitis and close PCP f/u for outpatient work up.  Pt discussed and evaluated by Dr. Kathrynn Humble who agrees with ED work up, tx and discharge plan.   Final Clinical Impressions(s) / ED Diagnoses   Final diagnoses:  Left ankle swelling  Acute left ankle pain    New Prescriptions Discharge Medication List as of 12/12/2016  1:50 AM    START taking these medications   Details  cephALEXin (KEFLEX) 500 MG capsule Take 1 capsule (500 mg total) by mouth 4 (four) times daily., Starting Tue 12/12/2016, Until Sun 12/17/2016, [place], PA-C 12/12/16 [place], PA-C 12/12/16 0205    Varney Biles, MD 12/12/16 1536

## 2016-12-12 NOTE — Discharge Instructions (Signed)
Your x-rays today showed mild soft tissue swelling without any bony abnormalities. Your calculated score for a deep vein clot was low, this means that you are a low risk patient for a vein clot. The lab work we did to test for a possible clot was also normal. It is unlikely that a deep vein clot is causing your symptoms.    You will be discharged with antibiotics to cover a possible soft tissue infection. Please take these antibiotics as instructed. Please elevate your left foot to decrease swelling.  You may take ibuprofen or tylenol for discomfort.   Please follow-up with your primary care provider for reevaluation in the next 2-3 days for further discussion of your symptoms and outpatient workup. Your primary care provider may discontinue your antibiotics at that time based on the progression or resolution of your symptoms.    Return to the emergency department if symptoms worsen or if you develop lower extremity weakness, tingling, numbness, fever.

## 2017-01-12 ENCOUNTER — Other Ambulatory Visit: Payer: Self-pay

## 2017-01-12 ENCOUNTER — Ambulatory Visit (INDEPENDENT_AMBULATORY_CARE_PROVIDER_SITE_OTHER): Payer: 59 | Admitting: Urgent Care

## 2017-01-12 VITALS — BP 130/80 | HR 82 | Temp 98.6°F | Resp 16 | Ht 70.0 in | Wt 268.4 lb

## 2017-01-12 DIAGNOSIS — E669 Obesity, unspecified: Secondary | ICD-10-CM

## 2017-01-12 DIAGNOSIS — N529 Male erectile dysfunction, unspecified: Secondary | ICD-10-CM

## 2017-01-12 DIAGNOSIS — E1136 Type 2 diabetes mellitus with diabetic cataract: Secondary | ICD-10-CM

## 2017-01-12 DIAGNOSIS — I10 Essential (primary) hypertension: Secondary | ICD-10-CM | POA: Diagnosis not present

## 2017-01-12 DIAGNOSIS — E1165 Type 2 diabetes mellitus with hyperglycemia: Secondary | ICD-10-CM

## 2017-01-12 DIAGNOSIS — IMO0002 Reserved for concepts with insufficient information to code with codable children: Secondary | ICD-10-CM

## 2017-01-12 DIAGNOSIS — IMO0001 Reserved for inherently not codable concepts without codable children: Secondary | ICD-10-CM

## 2017-01-12 LAB — POCT GLYCOSYLATED HEMOGLOBIN (HGB A1C): Hemoglobin A1C: 10.5

## 2017-01-12 LAB — GLUCOSE, POCT (MANUAL RESULT ENTRY): POC Glucose: 188 mg/dl — AB (ref 70–99)

## 2017-01-12 MED ORDER — ATORVASTATIN CALCIUM 10 MG PO TABS: 10.0000 mg | ORAL_TABLET | Freq: Every day | ORAL | 3 refills | Status: DC

## 2017-01-12 MED ORDER — SITAGLIPTIN PHOSPHATE 100 MG PO TABS: 100.0000 mg | ORAL_TABLET | Freq: Every day | ORAL | 0 refills | Status: DC

## 2017-01-12 MED ORDER — LISINOPRIL 10 MG PO TABS: 10.0000 mg | ORAL_TABLET | Freq: Every day | ORAL | 0 refills | Status: DC

## 2017-01-12 MED FILL — ATORVASTATIN 10 MG TABLET: 10 | 90 days supply | Qty: 90 | Fill #0

## 2017-01-12 MED FILL — LISINOPRIL 10 MG TABLET: 10 | 30 days supply | Qty: 30 | Fill #0

## 2017-01-12 MED FILL — JANUVIA 100 MG TABLET: 100 | 30 days supply | Qty: 30 | Fill #0

## 2017-01-12 NOTE — Progress Notes (Signed)
MRN: [id number]  Subjective:   Ian Hughes is a 36 y.o. male who presents for follow up of Type 2 Diabetes Mellitus.   Diagnosis was made 2008. Patient is currently managed with metformin, glimepiride, Januvia. Denies adverse effects including metallic taste, hypoglycemia, nausea, vomiting. Patient is checking home blood sugars. Home blood sugar is generally high 100's. Patient denies blurred vision, polydipsia, chest pain, nausea, vomiting, abdominal pain, hematuria, polyuria, skin infections, numbness or tingling. Patient is not checking their feet daily. Denies foot concerns. Last diabetic eye exam eye exam was 10/2016, denies diabetic retinopathy. Diet is generally healthy. He met with a dietician and feels that it helped guide his dietary choices. Patient is not exercising. Last urine micro-albumin was negative. Known diabetic complications: none Immunizations: Flu vaccine 08/2016, shingles 06/22/2016, pneumococal vaccine 06/22/2016  ED - Patient reports difficulty with obtaining and maintaining erections. He is having some difficulty with his wife. Would like to discuss medical therapy for this. Denies smoking cigarettes. Has occasional alcohol drink.  Objective:   PHYSICAL EXAM BP 130/80   Pulse 82   Temp 98.6 F (37 C) (Oral)   Resp 16   Ht '5\' 10"'  (1.778 m)   Wt 268 lb 6.4 oz (121.7 kg)   SpO2 100%   BMI 38.51 kg/m   Physical Exam  Constitutional: He is oriented to person, place, and time. He appears well-developed and well-nourished.  HENT:  Mouth/Throat: Oropharynx is clear and moist.  Eyes: No scleral icterus.  Neck: Normal range of motion. Neck supple.  Cardiovascular: Normal rate, regular rhythm and intact distal pulses.  Exam reveals no gallop and no friction rub.   No murmur heard. Pulmonary/Chest: No respiratory distress. He has no wheezes. He has no rales.  Abdominal: Soft. Bowel sounds are normal. He exhibits no distension and no mass. There is no  tenderness. There is no guarding.  Neurological: He is alert and oriented to person, place, and time.  Skin: Skin is warm and dry.   Diabetic Foot Exam - Simple   Simple Foot Form Diabetic Foot exam was performed with the following findings:  Yes 01/12/2017  2:44 PM  Visual Inspection No deformities, no ulcerations, no other skin breakdown bilaterally:  Yes Sensation Testing Intact to touch and monofilament testing bilaterally:  Yes Pulse Check Posterior Tibialis and Dorsalis pulse intact bilaterally:  Yes Comments    Results for orders placed or performed in visit on 01/12/17 (from the past 24 hour(s))  POCT glucose (manual entry)     Status: Abnormal   Collection Time: 01/12/17  3:02 PM  Result Value Ref Range   POC Glucose 188 (A) 70 - 99 mg/dl  POCT glycosylated hemoglobin (Hb A1C)     Status: None   Collection Time: 01/12/17  3:11 PM  Result Value Ref Range   Hemoglobin A1C 10.5    Assessment and Plan :   1. Uncontrolled type 2 diabetes mellitus without complication, without long-term current use of insulin ([place]) 2. Essential hypertension 3. Obesity, unspecified classification, unspecified obesity type, unspecified whether serious comorbidity present - Maintain healthy diet and exercise. Will discuss case with Dr. Carlota Raspberry for better diabetes management.  4. Erectile dysfunction, unspecified erectile dysfunction type - Discussed potential for adverse effects with patient. He is low risk given no smoking, chest pain, alcohol use and good cholesterol. I suspect there may be an anxiety component. Recommended patient discuss symptoms with a therapist.   Jaynee Eagles, PA-C [facility] [phone] 01/12/2017 2:43 PM

## 2017-01-12 NOTE — Patient Instructions (Addendum)
Diabetes Mellitus and Food It is important for you to manage your blood sugar (glucose) level. Your blood glucose level can be greatly affected by what you eat. Eating healthier foods in the appropriate amounts throughout the day at about the same time each day will help you control your blood glucose level. It can also help slow or prevent worsening of your diabetes mellitus. Healthy eating may even help you improve the level of your blood pressure and reach or maintain a healthy weight. General recommendations for healthful eating and cooking habits include:  Eating meals and snacks regularly. Avoid going long periods of time without eating to lose weight.  Eating a diet that consists mainly of plant-based foods, such as fruits, vegetables, nuts, legumes, and whole grains.  Using low-heat cooking methods, such as baking, instead of high-heat cooking methods, such as deep frying.  Work with your dietitian to make sure you understand how to use the Nutrition Facts information on food labels. How can food affect me? Carbohydrates Carbohydrates affect your blood glucose level more than any other type of food. Your dietitian will help you determine how many carbohydrates to eat at each meal and teach you how to count carbohydrates. Counting carbohydrates is important to keep your blood glucose at a healthy level, especially if you are using insulin or taking certain medicines for diabetes mellitus. Alcohol Alcohol can cause sudden decreases in blood glucose (hypoglycemia), especially if you use insulin or take certain medicines for diabetes mellitus. Hypoglycemia can be a life-threatening condition. Symptoms of hypoglycemia (sleepiness, dizziness, and disorientation) are similar to symptoms of having too much alcohol. If your health care provider has given you approval to drink alcohol, do so in moderation and use the following guidelines:  Women should not have more than one drink per day, and men  should not have more than two drinks per day. One drink is equal to: ? 12 oz of beer. ? 5 oz of wine. ? 1 oz of hard liquor.  Do not drink on an empty stomach.  Keep yourself hydrated. Have water, diet soda, or unsweetened iced tea.  Regular soda, juice, and other mixers might contain a lot of carbohydrates and should be counted.  What foods are not recommended? As you make food choices, it is important to remember that all foods are not the same. Some foods have fewer nutrients per serving than other foods, even though they might have the same number of calories or carbohydrates. It is difficult to get your body what it needs when you eat foods with fewer nutrients. Examples of foods that you should avoid that are high in calories and carbohydrates but low in nutrients include:  Trans fats (most processed foods list trans fats on the Nutrition Facts label).  Regular soda.  Juice.  Candy.  Sweets, such as cake, pie, doughnuts, and cookies.  Fried foods.  What foods can I eat? Eat nutrient-rich foods, which will nourish your body and keep you healthy. The food you should eat also will depend on several factors, including:  The calories you need.  The medicines you take.  Your weight.  Your blood glucose level.  Your blood pressure level.  Your cholesterol level.  You should eat a variety of foods, including:  Protein. ? Lean cuts of meat. ? Proteins low in saturated fats, such as fish, egg whites, and beans. Avoid processed meats.  Fruits and vegetables. ? Fruits and vegetables that may help control blood glucose levels, such as apples,   yams.  Dairy products.  Choose fat-free or low-fat dairy products, such as milk, yogurt, and cheese.  Grains, bread, pasta, and rice.  Choose whole grain products, such as multigrain bread, whole oats, and brown rice. These foods may help control blood pressure.  Fats.  Foods containing healthful fats, such as nuts,  avocado, olive oil, canola oil, and fish. Does everyone with diabetes mellitus have the same meal plan? Because every person with diabetes mellitus is different, there is not one meal plan that works for everyone. It is very important that you meet with a dietitian who will help you create a meal plan that is just right for you. This information is not intended to replace advice given to you by your health care provider. Make sure you discuss any questions you have with your health care provider. Document Released: 08/03/2005 Document Revised: 04/13/2016 Document Reviewed: 10/03/2013 Elsevier Interactive Patient Education  2017 [employer]    Erectile Dysfunction Erectile dysfunction is the inability to get or sustain a good enough erection to have sexual intercourse. Erectile dysfunction may involve:  Inability to get an erection.  Lack of enough hardness to allow penetration.  Loss of the erection before sex is finished.  Premature ejaculation. CAUSES  Certain drugs, such as:  Pain relievers.  Antihistamines.  Antidepressants.  Blood pressure medicines.  Water pills (diuretics).  Ulcer medicines.  Muscle relaxants.  Illegal drugs.  Excessive drinking.  Psychological causes, such as:  Anxiety.  Depression.  Sadness.  Exhaustion.  Performance fear.  Stress.  Physical causes, such as:  Artery problems. This may include diabetes, smoking, liver disease, or atherosclerosis.  High blood pressure.  Hormonal problems, such as low testosterone.  Obesity.  Nerve problems. This may include back or pelvic injuries, diabetes mellitus, multiple sclerosis, or Parkinson disease. SYMPTOMS  Inability to get an erection.  Lack of enough hardness to allow penetration.  Loss of the erection before sex is finished.  Premature ejaculation.  Normal erections at some times, but with frequent unsatisfactory episodes.  Orgasms that are not satisfactory in  sensation or frequency.  Low sexual satisfaction in either partner because of erection problems.  A curved penis occurring with erection. The curve may cause pain or may be too curved to allow for intercourse.  Never having nighttime erections. DIAGNOSIS Your caregiver can often diagnose this condition by:  Performing a physical exam to find other diseases or specific problems with the penis.  Asking you detailed questions about the problem.  Performing blood tests to check for diabetes mellitus or to measure hormone levels.  Performing urine tests to find other underlying health conditions.  Performing an ultrasound exam to check for scarring.  Performing a test to check blood flow to the penis.  Doing a sleep study at home to measure nighttime erections. TREATMENT   You may be prescribed medicines by mouth.  You may be given medicine injections into the penis.  You may be prescribed a vacuum pump with a ring.  Penile implant surgery may be performed. You may receive:  An inflatable implant.  A semirigid implant.  Blood vessel surgery may be performed. HOME CARE INSTRUCTIONS  If you are prescribed oral medicine, you should take the medicine as prescribed. Do not increase the dosage without first discussing it with your physician.  If you are using self-injections, be careful to avoid any veins that are on the surface of the penis. Apply pressure to the injection site for 5 minutes.  If you  are using a vacuum pump, make sure you have read the instructions before using it. Discuss any questions with your physician before taking the pump home. SEEK MEDICAL CARE IF:  You experience pain that is not responsive to the pain medicine you have been prescribed.  You experience nausea or vomiting. SEEK IMMEDIATE MEDICAL CARE IF:   When taking oral or injectable medications, you experience an erection that lasts longer than 4 hours. If your physician is unavailable, go to the  nearest emergency room for evaluation. An erection that lasts much longer than 4 hours can result in permanent damage to your penis.  You have pain that is severe.  You develop redness, severe pain, or severe swelling of your penis.  You have redness spreading up into your groin or lower abdomen.  You are unable to pass your urine. This information is not intended to replace advice given to you by your health care provider. Make sure you discuss any questions you have with your health care provider. Document Released: 11/03/2000 Document Revised: 07/09/2013 Document Reviewed: 04/10/2013 Elsevier Interactive Patient Education  2017 [employer].   Sildenafil tablets (Viagra) What is this medicine? SILDENAFIL (sil DEN a fil) is used to treat erection problems in men. This medicine may be used for other purposes; ask your health care provider or pharmacist if you have questions. COMMON BRAND NAME(S): Viagra What should I tell my health care provider before I take this medicine? They need to know if you have any of these conditions: -bleeding disorders -eye or vision problems, including a rare inherited eye disease called retinitis pigmentosa -anatomical deformation of the penis, Peyronie's disease, or history of priapism (painful and prolonged erection) -heart disease, angina, a history of heart attack, irregular heart beats, or other heart problems -high or low blood pressure -history of blood diseases, like sickle cell anemia or leukemia -history of stomach bleeding -kidney disease -liver disease -stroke -an unusual or allergic reaction to sildenafil, other medicines, foods, dyes, or preservatives -pregnant or trying to get pregnant -breast-feeding How should I use this medicine? Take this medicine by mouth with a glass of water. Follow the directions on the prescription label. The dose is usually taken 1 hour before sexual activity. You should not take the dose more than once per  day. Do not take your medicine more often than directed. Talk to your pediatrician regarding the use of this medicine in children. This medicine is not used in children for this condition. Overdosage: If you think you have taken too much of this medicine contact a poison control center or emergency room at once. NOTE: This medicine is only for you. Do not share this medicine with others. What if I miss a dose? This does not apply. Do not take double or extra doses. What may interact with this medicine? Do not take this medicine with any of the following medications: -cisapride -nitrates like amyl nitrite, isosorbide dinitrate, isosorbide mononitrate, nitroglycerin -riociguat This medicine may also interact with the following medications: -antiviral medicines for HIV or AIDS -bosentan -certain medicines for benign prostatic hyperplasia (BPH) -certain medicines for blood pressure -certain medicines for fungal infections like ketoconazole and itraconazole -cimetidine -erythromycin -rifampin This list may not describe all possible interactions. Give your health care provider a list of all the medicines, herbs, non-prescription drugs, or dietary supplements you use. Also tell them if you smoke, drink alcohol, or use illegal drugs. Some items may interact with your medicine. What should I watch for while using this  medicine? If you notice any changes in your vision while taking this drug, call your doctor or health care professional as soon as possible. Stop using this medicine and call your health care provider right away if you have a loss of sight in one or both eyes. Contact your doctor or health care professional right away if you have an erection that lasts longer than 4 hours or if it becomes painful. This may be a sign of a serious problem and must be treated right away to prevent permanent damage. If you experience symptoms of nausea, dizziness, chest pain or arm pain upon initiation of  sexual activity after taking this medicine, you should refrain from further activity and call your doctor or health care professional as soon as possible. Do not drink alcohol to excess (examples, 5 glasses of wine or 5 shots of whiskey) when taking this medicine. When taken in excess, alcohol can increase your chances of getting a headache or getting dizzy, increasing your heart rate or lowering your blood pressure. Using this medicine does not protect you or your partner against HIV infection (the virus that causes AIDS) or other sexually transmitted diseases. What side effects may I notice from receiving this medicine? Side effects that you should report to your doctor or health care professional as soon as possible: -allergic reactions like skin rash, itching or hives, swelling of the face, lips, or tongue -breathing problems -changes in hearing -changes in vision -chest pain -fast, irregular heartbeat -prolonged or painful erection -seizures Side effects that usually do not require medical attention (report to your doctor or health care professional if they continue or are bothersome): -back pain -dizziness -flushing -headache -indigestion -muscle aches -nausea -stuffy or runny nose This list may not describe all possible side effects. Call your doctor for medical advice about side effects. You may report side effects to FDA at 1-800-FDA-1088. Where should I keep my medicine? Keep out of reach of children. Store at room temperature between 15 and 30 degrees C (59 and 86 degrees F). Throw away any unused medicine after the expiration date. NOTE: This sheet is a summary. It may not cover all possible information. If you have questions about this medicine, talk to your doctor, pharmacist, or health care provider.  2017 Elsevier/Gold Standard (2015-10-20 12:00:25)   IF you received an x-ray today, you will receive an invoice from [facility]. Please contact [facility]  Radiology at [phone] with questions or concerns regarding your invoice.   IF you received labwork today, you will receive an invoice from [facility]. Please contact [facility] at [phone] with questions or concerns regarding your invoice.   Our billing staff will not be able to assist you with questions regarding bills from these companies.  You will be contacted with the lab results as soon as they are available. The fastest way to get your results is to activate your My Chart account. Instructions are located on the last page of this paperwork. If you have not heard from [place] regarding the results in 2 weeks, please contact this office.

## 2017-01-12 NOTE — Telephone Encounter (Signed)
Fax req from [facility] 100mg , Lisinopril 10mg  Given 30 days - last ov & lab 06/2016 IC pt, LMOVM to call for appt prior to needing further refills

## 2017-01-13 LAB — CMP14+EGFR
A/G RATIO: 1.9 (ref 1.2–2.2)
ALT: 25 IU/L (ref 0–44)
AST: 12 IU/L (ref 0–40)
Albumin: 4.1 g/dL (ref 3.5–5.5)
Alkaline Phosphatase: 99 IU/L (ref 39–117)
BILIRUBIN TOTAL: 0.2 mg/dL (ref 0.0–1.2)
BUN / CREAT RATIO: 14 (ref 9–20)
BUN: 13 mg/dL (ref 6–20)
CHLORIDE: 103 mmol/L (ref 96–106)
CO2: 25 mmol/L (ref 18–29)
Calcium: 9.3 mg/dL (ref 8.7–10.2)
Creatinine, Ser: 0.92 mg/dL (ref 0.76–1.27)
GFR calc non Af Amer: 107 mL/min/{1.73_m2} (ref >59)
GFR, EST AFRICAN AMERICAN: 124 mL/min/{1.73_m2} (ref >59)
GLOBULIN, TOTAL: 2.2 g/dL (ref 1.5–4.5)
Glucose: 164 mg/dL — ABNORMAL HIGH (ref 65–99)
POTASSIUM: 4.4 mmol/L (ref 3.5–5.2)
SODIUM: 141 mmol/L (ref 134–144)
TOTAL PROTEIN: 6.3 g/dL (ref 6.0–8.5)

## 2017-01-18 ENCOUNTER — Telehealth: Payer: Self-pay | Admitting: Urgent Care

## 2017-01-18 NOTE — Telephone Encounter (Signed)
Called patient to discuss treatment plan and follow up for uncontrolled diabetes. Please request a reliable time for me to reach him to discuss these items.

## 2017-01-18 NOTE — Telephone Encounter (Signed)
Patient is available today (Thursday 3/01) or tomorrow, after 3 pm. Sent him a My Chart Sign-Up text so he can register his account. [name] may send his recommendations via My Chart if appropriate.

## 2017-01-23 NOTE — Telephone Encounter (Signed)
I left VM for patient. His diabetes remains uncontrolled. I recommended he set up a follow up appointment with me or Dr. Carlota Raspberry. We need to consider adding insulin, referral to endocrinology, diabetes nutrition. Hopefully these items can be agreed upon with patient and follow up visit.

## 2017-02-13 ENCOUNTER — Other Ambulatory Visit: Payer: Self-pay | Admitting: Family Medicine

## 2017-02-13 DIAGNOSIS — E1165 Type 2 diabetes mellitus with hyperglycemia: Principal | ICD-10-CM

## 2017-02-13 DIAGNOSIS — E1136 Type 2 diabetes mellitus with diabetic cataract: Secondary | ICD-10-CM

## 2017-02-13 DIAGNOSIS — I10 Essential (primary) hypertension: Secondary | ICD-10-CM

## 2017-02-13 DIAGNOSIS — IMO0002 Reserved for concepts with insufficient information to code with codable children: Secondary | ICD-10-CM

## 2017-02-15 MED FILL — JANUVIA 100 MG TABLET: 100 | 30 days supply | Qty: 30 | Fill #0

## 2017-02-15 MED FILL — LISINOPRIL 10 MG TABLET: 10 | 30 days supply | Qty: 30 | Fill #0

## 2017-02-15 MED FILL — metFORMIN HCL 1000 MG TABS: 1000 | 90 days supply | Qty: 180 | Fill #0

## 2017-03-29 ENCOUNTER — Other Ambulatory Visit: Payer: Self-pay | Admitting: Physician Assistant

## 2017-03-29 DIAGNOSIS — IMO0002 Reserved for concepts with insufficient information to code with codable children: Secondary | ICD-10-CM

## 2017-03-29 DIAGNOSIS — E1136 Type 2 diabetes mellitus with diabetic cataract: Secondary | ICD-10-CM

## 2017-03-29 DIAGNOSIS — E1165 Type 2 diabetes mellitus with hyperglycemia: Principal | ICD-10-CM

## 2017-03-29 DIAGNOSIS — I10 Essential (primary) hypertension: Secondary | ICD-10-CM

## 2017-03-29 MED FILL — GLIMEPIRIDE 4 MG TABLET: 4 | 30 days supply | Qty: 30 | Fill #0

## 2017-03-29 MED FILL — ATORVASTATIN 10 MG TABLET: 10 | 90 days supply | Qty: 90 | Fill #1

## 2017-04-07 ENCOUNTER — Ambulatory Visit (INDEPENDENT_AMBULATORY_CARE_PROVIDER_SITE_OTHER): Payer: 59 | Admitting: Family Medicine

## 2017-04-07 ENCOUNTER — Encounter: Payer: Self-pay | Admitting: Family Medicine

## 2017-04-07 VITALS — BP 126/71 | HR 72 | Temp 99.1°F | Resp 16 | Ht 70.0 in | Wt 268.2 lb

## 2017-04-07 DIAGNOSIS — E1136 Type 2 diabetes mellitus with diabetic cataract: Secondary | ICD-10-CM

## 2017-04-07 DIAGNOSIS — I10 Essential (primary) hypertension: Secondary | ICD-10-CM | POA: Diagnosis not present

## 2017-04-07 DIAGNOSIS — IMO0001 Reserved for inherently not codable concepts without codable children: Secondary | ICD-10-CM

## 2017-04-07 DIAGNOSIS — E669 Obesity, unspecified: Secondary | ICD-10-CM

## 2017-04-07 DIAGNOSIS — E1165 Type 2 diabetes mellitus with hyperglycemia: Secondary | ICD-10-CM

## 2017-04-07 DIAGNOSIS — IMO0002 Reserved for concepts with insufficient information to code with codable children: Secondary | ICD-10-CM

## 2017-04-07 DIAGNOSIS — N529 Male erectile dysfunction, unspecified: Secondary | ICD-10-CM | POA: Diagnosis not present

## 2017-04-07 MED ORDER — LISINOPRIL 10 MG PO TABS: 10.0000 mg | ORAL_TABLET | Freq: Every day | ORAL | 1 refills | Status: DC

## 2017-04-07 MED ORDER — SITAGLIPTIN PHOSPHATE 100 MG PO TABS: 100.0000 mg | ORAL_TABLET | Freq: Every day | ORAL | 1 refills | Status: DC

## 2017-04-07 MED ORDER — GLIMEPIRIDE 4 MG PO TABS: 4.0000 mg | ORAL_TABLET | Freq: Every day | ORAL | 1 refills | Status: DC

## 2017-04-07 MED ORDER — METFORMIN HCL 1000 MG PO TABS: ORAL_TABLET | ORAL | 1 refills | Status: DC

## 2017-04-07 NOTE — Patient Instructions (Addendum)
I referred you to urology to discuss other evaluation or treatment for erectile dysfunction, but also checked testosterone levels again.  I will likely also refer you to endocrinology for diabetes options, but continue same medication for now until we see your levels.   IF you received an x-ray today, you will receive an invoice from [facility]. Please contact [facility] at [phone] with questions or concerns regarding your invoice.   IF you received labwork today, you will receive an invoice from [facility]. Please contact [facility] at [phone] with questions or concerns regarding your invoice.   Our billing staff will not be able to assist you with questions regarding bills from these companies.  You will be contacted with the lab results as soon as they are available. The fastest way to get your results is to activate your My Chart account. Instructions are located on the last page of this paperwork. If you have not heard from [place] regarding the results in 2 weeks, please contact this office.

## 2017-04-07 NOTE — Progress Notes (Signed)
Subjective:  By signing my name below, I, [name], attest that this documentation has been prepared under the direction and in the presence of Merri Ray, MD. Electronically Signed: [name], Christmas. 04/07/2017 , 11:07 AM .  Patient was seen in Room 13 .   Patient ID: Ian Hughes, male    DOB: [birth year], 36 y.o.   MRN: [id number] Chief Complaint  Patient presents with  . Follow-up    TIIDM   HPI Ian Hughes is a 36 y.o. male Here for follow up on diabetes. He was last seen on Aug 2017 for physical. He is fasting today.   Diabetes We discussed amaryl to be taken at first large meal of the day. We also discussed exercise and diet changes. He was continued on metformin, januvia and amaryl same doses. He was last seen by Jaynee Eagles, PA-C, on Feb 23rd for his diabetes. He states he missed medication about once a week at that time. His last diabetic eye exam done Dec 2017. His A1C was 10.5 at Feb 23rd visit. He is also on atorvastatin, considered adding insulin or referral to endocrinologist if continued uncontrolled.   He states he's been out of his medications, Januvia and lisinopril, for about 8 days now. Today was the first chance he could get to be seen. He denies symptomatic lows. He denies any side effects or complications the statin. He denies checking his BP when taking lisinopril. He plans to see dentist later today. He denies taking baby aspirin.   Wt Readings from Last 3 Encounters:  04/07/17 268 lb 3.2 oz (121.7 kg)  01/12/17 268 lb 6.4 oz (121.7 kg)  12/11/16 277 lb (125.6 kg)    Lab Results  Component Value Date   CHOL 156 06/22/2016   HDL 47 06/22/2016   LDLCALC 98 06/22/2016   LDLDIRECT 105 (H) 08/21/2013   TRIG 56 06/22/2016   CHOLHDL 3.3 06/22/2016   Exercise He's been exercising 1 hour a day, for 3 days a week. In the past, he was too busy to get the time, but now he finds time during work.   ED His wife wants him to see a specialist  because he has trouble obtaining and maintaining erections. He hasn't taken cialis for it because he ran out. He also notes that it didn't work. He had normal testosterone in Aug 2017.   Patient Active Problem List   Diagnosis Date Noted  . UARS (upper airway resistance syndrome) 04/07/2015  . Severe obesity (BMI >= 40) ([place]) 04/07/2015  . Primary snoring 04/07/2015  . Snoring 12/21/2014  . Drowsiness 12/21/2014  . Witnessed apneic spells 12/21/2014  . ED (erectile dysfunction) 09/24/2012  . Diabetes mellitus type 2 in obese ([place]) 07/04/2009    Class: Chronic  . OBESITY, NOS 01/17/2007  . BELLS PALSY 01/17/2007  . HYPERTENSION, BENIGN SYSTEMIC 01/17/2007   Past Medical History:  Diagnosis Date  . Cataract 2011   surgery on R eye  . Diabetes mellitus without complication ([place])   . High cholesterol   . Obesity    Past Surgical History:  Procedure Laterality Date  . EYE SURGERY Left 2011   cataract  . EYE SURGERY Right 2012   No Known Allergies Prior to Admission medications   Medication Sig Start Date End Date Taking? Authorizing Provider  atorvastatin (LIPITOR) 10 MG tablet Take 1 tablet (10 mg total) by mouth daily. 01/12/17  Yes Jaynee Eagles, PA-C  Blood Glucose Monitoring Suppl (BLOOD GLUCOSE METER)  kit Use to test blood sugar daily. 01/07/14  Yes Barton Fanny, MD  glimepiride (AMARYL) 4 MG tablet Take 1 tablet (4 mg total) by mouth daily with breakfast. (or first meal of the day) 06/22/16  Yes Wendie Agreste, MD  glucose blood test strip Use to test fasting blood sugar daily. 01/07/14  Yes Barton Fanny, MD  JANUVIA 100 MG tablet TAKE 1 TABLET (100 MG TOTAL) BY MOUTH DAILY. 02/15/17  Yes Weber, Damaris Hippo, PA-C  Lancet Devices (LANCING DEVICE) MISC Use to test blood sugar daily. 01/07/14  Yes Barton Fanny, MD  Lancets MISC Use to test blood sugar daily. 01/07/14  Yes Barton Fanny, MD  lisinopril (PRINIVIL,ZESTRIL) 10 MG tablet TAKE 1 TABLET (10 MG  TOTAL) BY MOUTH DAILY. 02/15/17  Yes Weber, Sarah L, PA-C  metFORMIN (GLUCOPHAGE) 1000 MG tablet TAKE 1 TABLET BY MOUTH 2 TIMES DAILY WITH A MEAL. 02/15/17  Yes Weber, Sarah L, PA-C  tadalafil (CIALIS) 10 MG tablet Take 0.5-1 tablets (5-10 mg total) by mouth daily as needed for erectile dysfunction. 06/22/16  Yes Wendie Agreste, MD   Social History   Social History  . Marital status: Married    Spouse name: N/A  . Number of children: 1  . Years of education: 12+   Occupational History  .  [employer]   Social History Main Topics  . Smoking status: Never Smoker  . Smokeless tobacco: Never Used  . Alcohol use 0.0 oz/week     Comment: 1 or 2 every 3 months   . Drug use: No  . Sexual activity: Not on file   Other Topics Concern  . Not on file   Social History Narrative   Lives at home with wife and stepson.   Caffeine use: none   Has one child.   Review of Systems  Constitutional: Negative for fatigue and unexpected weight change.  Eyes: Negative for visual disturbance.  Respiratory: Negative for cough, chest tightness and shortness of breath.   Cardiovascular: Negative for chest pain, palpitations and leg swelling.  Gastrointestinal: Negative for abdominal pain and blood in stool.  Neurological: Negative for dizziness, light-headedness and headaches.       Objective:   Physical Exam  Constitutional: He is oriented to person, place, and time. He appears well-developed and well-nourished.  HENT:  Head: Normocephalic and atraumatic.  Eyes: EOM are normal. Pupils are equal, round, and reactive to light.  Neck: No JVD present. Carotid bruit is not present.  Cardiovascular: Normal rate, regular rhythm and normal heart sounds.   No murmur heard. Pulmonary/Chest: Effort normal and breath sounds normal. He has no rales.  Musculoskeletal: He exhibits no edema.  Trace non pitting edema in lower extremities  Neurological: He is alert and oriented to person, place, and time.    Skin: Skin is warm and dry.  Psychiatric: He has a normal mood and affect.  Vitals reviewed.   Vitals:   04/07/17 0954  BP: 126/71  Pulse: 72  Resp: 16  Temp: 99.1 F (37.3 C)  TempSrc: Oral  SpO2: 100%  Weight: 268 lb 3.2 oz (121.7 kg)  Height: _0  (1.778 m)      Assessment & Plan:   Ian Hughes is a 36 y.o. male Type 2 diabetes mellitus with hyperglycemia, without long-term current use of insulin ([place]) , Uncontrolled type 2 diabetes mellitus with diabetic cataract, without long-term current use of insulin ([place]) - Plan: sitaGLIPtin (JANUVIA) 100 MG tablet,  glimepiride (AMARYL) 4 MG tablet, metFORMIN (GLUCOPHAGE) 1000 MG tablet, Hemoglobin A1c  - Check A1c, but nonadherent to medications past week.  Importance of returning prior to running out of medications was discussed.  - A1c still significantly elevated, anticipate endocrinology referral.   Essential hypertension - Plan: lisinopril (PRINIVIL,ZESTRIL) 10 MG tablet  - refilled lisinopril, but check home readings as blood pressure overall controlled in office off of medications. Denies hypotensive symptoms on meds.   Obesity, unspecified classification, unspecified obesity type, unspecified whether serious comorbidity present  - Commended on starting exercise, continue exercise and diet changes for weight loss.  Erectile dysfunction, unspecified erectile dysfunction type - Plan: Ambulatory referral to Urology, Testosterone, Free, Total, SHBG  - Did not experience relief with Cialis. Recheck testosterone, referral to urology for further evaluation.  Meds ordered this encounter  Medications  . sitaGLIPtin (JANUVIA) 100 MG tablet    Sig: Take 1 tablet (100 mg total) by mouth daily.    Dispense:  90 tablet    Refill:  1  . glimepiride (AMARYL) 4 MG tablet    Sig: Take 1 tablet (4 mg total) by mouth daily with breakfast. (or first meal of the day)    Dispense:  90 tablet    Refill:  1  . lisinopril (PRINIVIL,ZESTRIL)  10 MG tablet    Sig: Take 1 tablet (10 mg total) by mouth daily.    Dispense:  90 tablet    Refill:  1  . metFORMIN (GLUCOPHAGE) 1000 MG tablet    Sig: TAKE 1 TABLET BY MOUTH 2 TIMES DAILY WITH A MEAL.    Dispense:  180 tablet    Refill:  1   Patient Instructions   I referred you to urology to discuss other evaluation or treatment for erectile dysfunction, but also checked testosterone levels again.  I will likely also refer you to endocrinology for diabetes options, but continue same medication for now until we see your levels.   IF you received an x-ray today, you will receive an invoice from [facility]. Please contact [facility] at [phone] with questions or concerns regarding your invoice.   IF you received labwork today, you will receive an invoice from [facility]. Please contact [facility] at [phone] with questions or concerns regarding your invoice.   Our billing staff will not be able to assist you with questions regarding bills from these companies.  You will be contacted with the lab results as soon as they are available. The fastest way to get your results is to activate your My Chart account. Instructions are located on the last page of this paperwork. If you have not heard from [place] regarding the results in 2 weeks, please contact this office.       I personally performed the services described in this documentation, which was scribed in my presence. The recorded information has been reviewed and considered for accuracy and completeness, addended by me as needed, and agree with information above.  Signed,   Merri Ray, MD [facility] [place].  04/07/17 6:01 PM

## 2017-04-09 MED FILL — LISINOPRIL 10 MG TABLET: 10 | 90 days supply | Qty: 90 | Fill #0

## 2017-04-09 MED FILL — JANUVIA 100 MG TABLET: 100 | 90 days supply | Qty: 90 | Fill #0

## 2017-04-10 LAB — LIPID PANEL
CHOLESTEROL TOTAL: 106 mg/dL (ref 100–199)
Chol/HDL Ratio: 2.7 ratio (ref 0.0–5.0)
HDL: 39 mg/dL — AB (ref >39)
LDL Calculated: 59 mg/dL (ref 0–99)
TRIGLYCERIDES: 38 mg/dL (ref 0–149)
VLDL CHOLESTEROL CAL: 8 mg/dL (ref 5–40)

## 2017-04-10 LAB — COMPREHENSIVE METABOLIC PANEL
ALBUMIN: 4.4 g/dL (ref 3.5–5.5)
ALT: 23 IU/L (ref 0–44)
AST: 14 IU/L (ref 0–40)
Albumin/Globulin Ratio: 2 (ref 1.2–2.2)
Alkaline Phosphatase: 113 IU/L (ref 39–117)
BUN / CREAT RATIO: 13 (ref 9–20)
BUN: 11 mg/dL (ref 6–20)
Bilirubin Total: 0.4 mg/dL (ref 0.0–1.2)
CALCIUM: 9.6 mg/dL (ref 8.7–10.2)
CO2: 24 mmol/L (ref 18–29)
CREATININE: 0.87 mg/dL (ref 0.76–1.27)
Chloride: 100 mmol/L (ref 96–106)
GFR, EST AFRICAN AMERICAN: 129 mL/min/{1.73_m2} (ref >59)
GFR, EST NON AFRICAN AMERICAN: 112 mL/min/{1.73_m2} (ref >59)
GLOBULIN, TOTAL: 2.2 g/dL (ref 1.5–4.5)
Glucose: 194 mg/dL — ABNORMAL HIGH (ref 65–99)
Potassium: 4.8 mmol/L (ref 3.5–5.2)
SODIUM: 140 mmol/L (ref 134–144)
Total Protein: 6.6 g/dL (ref 6.0–8.5)

## 2017-04-10 LAB — TESTOSTERONE, FREE, TOTAL, SHBG
Sex Hormone Binding: 33.8 nmol/L (ref 16.5–55.9)
Testosterone, Free: 22 pg/mL (ref 8.7–25.1)
Testosterone: 350 ng/dL (ref 264–916)

## 2017-04-10 LAB — HEMOGLOBIN A1C
Est. average glucose Bld gHb Est-mCnc: 258 mg/dL
Hgb A1c MFr Bld: 10.6 % — ABNORMAL HIGH (ref 4.8–5.6)

## 2017-04-16 ENCOUNTER — Other Ambulatory Visit: Payer: Self-pay | Admitting: Family Medicine

## 2017-04-16 DIAGNOSIS — E1165 Type 2 diabetes mellitus with hyperglycemia: Secondary | ICD-10-CM

## 2017-05-04 MED FILL — GLIMEPIRIDE 4 MG TABLET: 4 | 90 days supply | Qty: 90 | Fill #0

## 2017-05-29 MED FILL — metFORMIN HCL 1000 MG TABS: 1000 | 90 days supply | Qty: 180 | Fill #1

## 2017-07-10 ENCOUNTER — Ambulatory Visit (INDEPENDENT_AMBULATORY_CARE_PROVIDER_SITE_OTHER): Payer: 59 | Admitting: Endocrinology

## 2017-07-10 ENCOUNTER — Encounter: Payer: Self-pay | Admitting: Endocrinology

## 2017-07-10 VITALS — BP 148/82 | HR 94 | Wt 265.6 lb

## 2017-07-10 DIAGNOSIS — E1169 Type 2 diabetes mellitus with other specified complication: Secondary | ICD-10-CM

## 2017-07-10 DIAGNOSIS — E669 Obesity, unspecified: Secondary | ICD-10-CM

## 2017-07-10 LAB — POCT GLYCOSYLATED HEMOGLOBIN (HGB A1C): HEMOGLOBIN A1C: 7

## 2017-07-10 MED ORDER — GLIMEPIRIDE 1 MG PO TABS: 1.0000 mg | ORAL_TABLET | Freq: Every day | ORAL | 3 refills | Status: DC

## 2017-07-10 MED ORDER — CANAGLIFLOZIN 100 MG PO TABS: 100.0000 mg | ORAL_TABLET | Freq: Every day | ORAL | 3 refills | Status: DC

## 2017-07-10 MED FILL — GLIMEPIRIDE 1 MG TABLET: 1 | 90 days supply | Qty: 90 | Fill #0

## 2017-07-10 NOTE — Progress Notes (Signed)
70

## 2017-07-10 NOTE — Patient Instructions (Addendum)
good diet and exercise significantly improve the control of your diabetes.  please let me know if you wish to be referred to a dietician.  high blood sugar is very risky to your health.  you should see an eye doctor and dentist every year.  It is very important to get all recommended vaccinations.  Controlling your blood pressure and cholesterol drastically reduces the damage diabetes does to your body.  Those who smoke should quit.  Please discuss these with your doctor.  check your blood sugar once a day.  vary the time of day when you check, between before the 3 meals, and at bedtime.  also check if you have symptoms of your blood sugar being too high or too low.  please keep a record of the readings and bring it to your next appointment here (or you can bring the meter itself).  You can write it on any piece of paper.  please call us sooner if your blood sugar goes below 70, or if you have a lot of readings over 200.   At our office, we are fortunate to have two specialists who are happy to help you:   Leonia Reader, RN, CDE, is a diabetes educator and pump trainer.  She is here on Monday mornings, and all day Tuesday and Wednesday.  She is can help you with low blood sugar avoidance and treatment, injecting insulin, sick day management, and others.  Antonieta Iba, RD is our dietician.  She is here all day Thursday and Friday.  She can advise you about a healthy diet.  She can also help you about a variety of special diabetes situations, such as shift work, Actor, gluten-free, diet for kidney patients, traveling with diabetes, and help for those who need to gain weight.  I have sent 2 prescriptions to your pharmacy: to reduce the glimepiride, and to add "invokana."  Please come back for a follow-up appointment in 2-3 months.    Bariatric Surgery You have so much to gain by losing weight.  You may have already tried every diet and exercise plan imaginable.  And, you may have sought advice from  your family physician, too.   Sometimes, in spite of such diligent efforts, you may not be able to achieve long-term results by yourself.  In cases of severe obesity, bariatric or weight loss surgery is a proven method of achieving long-term weight control.  Our Services Our bariatric surgery programs offer our patients new hope and long-term weight-loss solution.  Since introducing our services in 2003, we have conducted more than 2,400 successful procedures.  Our program is designated as a Programmer, multimedia by the Metabolic and Bariatric Surgery Accreditation and Quality Improvement Program (MBSAQIP), a IT trainer that sets rigorous patient safety and outcome standards.  Our program is also designated as a Ecologist by [employer].   Our exceptional weight-loss surgery team specializes in diagnosis, treatment, follow-up care, and ongoing support for our patients with severe weight loss challenges.  We currently offer laparoscopic sleeve gastrectomy, gastric bypass, and adjustable gastric band (LAP-BAND).    Attend our [place] Choosing to undergo a bariatric procedure is a big decision, and one that should not be taken lightly.  You now have two options in how you learn about weight-loss surgery - in person or online.  Our objective is to ensure you have all of the information that you need to evaluate the advantages and obligations of this life changing procedure.  Please note that you are not alone in this process, and our experienced team is ready to assist and answer all of your questions.  There are several ways to register for a seminar (either on-line or in person):  1)  Call [phone] 2) Go on-line to [facility] and register for either type of seminar.  [email or web address]

## 2017-07-10 NOTE — Progress Notes (Signed)
Subjective:    Patient ID: Ian Hughes, male    DOB: 11-30-1980, 36 y.o.   MRN: [id number]  HPI pt is referred by Dr Carlota Raspberry, for diabetes.  Pt states DM was dx'ed in 2009; he has mild if any neuropathy of the lower extremities; he is unaware of any associated chronic complications; he has never been on insulin; pt says his diet and exercise are fair; he has never had pancreatitis, pancreatic surgery, severe hypoglycemia or DKA.  He takes 3 oral meds.  Past Medical History:  Diagnosis Date  . Cataract 2011   surgery on R eye  . Diabetes mellitus without complication ([place])   . High cholesterol   . Obesity     Past Surgical History:  Procedure Laterality Date  . EYE SURGERY Left 2011   cataract  . EYE SURGERY Right 2012    Social History   Social History  . Marital status: Married    Spouse name: N/A  . Number of children: 1  . Years of education: 12+   Occupational History  .  [employer]   Social History Main Topics  . Smoking status: Never Smoker  . Smokeless tobacco: Never Used  . Alcohol use 0.0 oz/week     Comment: 1 or 2 every 3 months   . Drug use: No  . Sexual activity: Not on file   Other Topics Concern  . Not on file   Social History Narrative   Lives at home with wife and stepson.   Caffeine use: none   Has one child.    Current Outpatient Prescriptions on File Prior to Visit  Medication Sig Dispense Refill  . atorvastatin (LIPITOR) 10 MG tablet Take 1 tablet (10 mg total) by mouth daily. 90 tablet 3  . Blood Glucose Monitoring Suppl (BLOOD GLUCOSE METER) kit Use to test blood sugar daily. 1 each 0  . glucose blood test strip Use to test fasting blood sugar daily. 100 each 3  . Lancet Devices (LANCING DEVICE) MISC Use to test blood sugar daily. 1 each 0  . Lancets MISC Use to test blood sugar daily. 100 each 3  . lisinopril (PRINIVIL,ZESTRIL) 10 MG tablet Take 1 tablet (10 mg total) by mouth daily. 90 tablet 1  . metFORMIN (GLUCOPHAGE) 1000 MG  tablet TAKE 1 TABLET BY MOUTH 2 TIMES DAILY WITH A MEAL. 180 tablet 1  . sitaGLIPtin (JANUVIA) 100 MG tablet Take 1 tablet (100 mg total) by mouth daily. 90 tablet 1  . tadalafil (CIALIS) 10 MG tablet Take 0.5-1 tablets (5-10 mg total) by mouth daily as needed for erectile dysfunction. 10 tablet 1   No current facility-administered medications on file prior to visit.     No Known Allergies  Family History  Problem Relation Age of Onset  . Diabetes Mother   . Cancer Father        leukemia   . Obesity Other     BP (!) 148/82 (BP Location: Right Arm)   Pulse 94   Wt 265 lb 9.6 oz (120.5 kg)   SpO2 98%   BMI 38.11 kg/m    Review of Systems denies weight loss, blurry vision, headache, chest pain, sob, n/v, urinary frequency, hypoglycemia, muscle cramps, excessive diaphoresis, depression, cold intolerance, rhinorrhea, and easy bruising.      Objective:   Physical Exam VS: see vs page GEN: no distress HEAD: head: no deformity eyes: no periorbital swelling, no proptosis external nose and ears are normal mouth:  no lesion seen NECK: supple, thyroid is not enlarged CHEST WALL: no deformity LUNGS: clear to auscultation CV: reg rate and rhythm, no murmur ABD: abdomen is soft, nontender.  no hepatosplenomegaly.  not distended.  no hernia MUSCULOSKELETAL: muscle bulk and strength are grossly normal.  no obvious joint swelling.  gait is normal and steady EXTEMITIES: no deformity.  no ulcer on the feet.  feet are of normal color and temp.  no edema PULSES: dorsalis pedis intact bilat.  no carotid bruit NEURO:  cn 2-12 grossly intact.   readily moves all 4's.  sensation is intact to touch on the feet SKIN:  Normal texture and temperature.  No rash or suspicious lesion is visible.   NODES:  None palpable at the neck PSYCH: alert, well-oriented.  Does not appear anxious nor depressed.   Lab Results  Component Value Date   CREATININE 0.87 04/07/2017   BUN 11 04/07/2017   NA 140  04/07/2017   K 4.8 04/07/2017   CL 100 04/07/2017   CO2 24 04/07/2017   Lab Results  Component Value Date   MICROALBUR 0.8 06/22/2016   Lab Results  Component Value Date   HGBA1C 7.0 07/10/2017    I have reviewed outside records, and summarized: Pt was noted to have elevated a1c, and referred here.  At the last PCP visit, a1c was much higher than now, and insulin was considered then.     Assessment & Plan:  Type 2 DM: he needs increased rx, if it can be done with a regimen that avoids or minimizes hypoglycemia.   Obesity: new to me.   HTN: invokana will help slightly.   Patient Instructions  good diet and exercise significantly improve the control of your diabetes.  please let me know if you wish to be referred to a dietician.  high blood sugar is very risky to your health.  you should see an eye doctor and dentist every year.  It is very important to get all recommended vaccinations.  Controlling your blood pressure and cholesterol drastically reduces the damage diabetes does to your body.  Those who smoke should quit.  Please discuss these with your doctor.  check your blood sugar once a day.  vary the time of day when you check, between before the 3 meals, and at bedtime.  also check if you have symptoms of your blood sugar being too high or too low.  please keep a record of the readings and bring it to your next appointment here (or you can bring the meter itself).  You can write it on any piece of paper.  please call us sooner if your blood sugar goes below 70, or if you have a lot of readings over 200.   At our office, we are fortunate to have two specialists who are happy to help you:   Leonia Reader, RN, CDE, is a diabetes educator and pump trainer.  She is here on Monday mornings, and all day Tuesday and Wednesday.  She is can help you with low blood sugar avoidance and treatment, injecting insulin, sick day management, and others.  Antonieta Iba, RD is our dietician.  She is  here all day Thursday and Friday.  She can advise you about a healthy diet.  She can also help you about a variety of special diabetes situations, such as shift work, Actor, gluten-free, diet for kidney patients, traveling with diabetes, and help for those who need to gain weight.  I have sent  2 prescriptions to your pharmacy: to reduce the glimepiride, and to add "invokana."  Please come back for a follow-up appointment in 2-3 months.    Bariatric Surgery You have so much to gain by losing weight.  You may have already tried every diet and exercise plan imaginable.  And, you may have sought advice from your family physician, too.   Sometimes, in spite of such diligent efforts, you may not be able to achieve long-term results by yourself.  In cases of severe obesity, bariatric or weight loss surgery is a proven method of achieving long-term weight control.  Our Services Our bariatric surgery programs offer our patients new hope and long-term weight-loss solution.  Since introducing our services in 2003, we have conducted more than 2,400 successful procedures.  Our program is designated as a Programmer, multimedia by the Metabolic and Bariatric Surgery Accreditation and Quality Improvement Program (MBSAQIP), a IT trainer that sets rigorous patient safety and outcome standards.  Our program is also designated as a Ecologist by [employer].   Our exceptional weight-loss surgery team specializes in diagnosis, treatment, follow-up care, and ongoing support for our patients with severe weight loss challenges.  We currently offer laparoscopic sleeve gastrectomy, gastric bypass, and adjustable gastric band (LAP-BAND).    Attend our [place] Choosing to undergo a bariatric procedure is a big decision, and one that should not be taken lightly.  You now have two options in how you learn about weight-loss surgery - in person or online.  Our objective is to  ensure you have all of the information that you need to evaluate the advantages and obligations of this life changing procedure.  Please note that you are not alone in this process, and our experienced team is ready to assist and answer all of your questions.  There are several ways to register for a seminar (either on-line or in person):  1)  Call [phone] 2) Go on-line to [facility] and register for either type of seminar.  [email or web address]

## 2017-07-16 MED FILL — LISINOPRIL 10 MG TABLET: 10 | 90 days supply | Qty: 90 | Fill #1

## 2017-07-16 MED FILL — JANUVIA 100 MG TABLET: 100 | 30 days supply | Qty: 30 | Fill #1

## 2017-07-16 MED FILL — ATORVASTATIN 10 MG TABLET: 10 | 90 days supply | Qty: 90 | Fill #2

## 2017-07-17 MED FILL — INVOKANA 100 MG TABLET: 100 | 90 days supply | Qty: 90 | Fill #0

## 2017-08-16 MED FILL — JANUVIA 100 MG TABLET: 100 | 30 days supply | Qty: 30 | Fill #2

## 2017-09-10 ENCOUNTER — Encounter: Payer: Self-pay | Admitting: Endocrinology

## 2017-09-10 ENCOUNTER — Ambulatory Visit: Payer: 59 | Admitting: Internal Medicine

## 2017-09-10 ENCOUNTER — Ambulatory Visit (INDEPENDENT_AMBULATORY_CARE_PROVIDER_SITE_OTHER): Payer: 59 | Admitting: Endocrinology

## 2017-09-10 DIAGNOSIS — E669 Obesity, unspecified: Secondary | ICD-10-CM

## 2017-09-10 DIAGNOSIS — E1169 Type 2 diabetes mellitus with other specified complication: Secondary | ICD-10-CM | POA: Diagnosis not present

## 2017-09-10 LAB — POCT GLYCOSYLATED HEMOGLOBIN (HGB A1C): Hemoglobin A1C: 7.8

## 2017-09-10 MED ORDER — BROMOCRIPTINE MESYLATE 2.5 MG PO TABS: ORAL_TABLET | ORAL | 3 refills | Status: DC

## 2017-09-10 MED ORDER — GLIMEPIRIDE 1 MG PO TABS: 0.5000 mg | ORAL_TABLET | Freq: Every day | ORAL | 3 refills | Status: DC

## 2017-09-10 MED FILL — BROMOCRIPTINE 2.5 MG TABLET: 2.5 | 88 days supply | Qty: 22 | Fill #0

## 2017-09-10 NOTE — Progress Notes (Signed)
Subjective:    Patient ID: Ian Hughes, male    DOB: April 24, 1981, 36 y.o.   MRN: [id number]  HPI Pt returns for f/u of diabetes mellitus: DM type: 2 Dx'ed: 2376 Complications: none Therapy: 3 oral meds DKA: never Severe hypoglycemia: never Pancreatitis: never Pancreatic imaging:  Other: he has never been on insulin Interval history: pt states he feels well in general.  He says the invokana causes diuresis.   Past Medical History:  Diagnosis Date  . Cataract 2011   surgery on R eye  . Diabetes mellitus without complication ([place])   . High cholesterol   . Obesity     Past Surgical History:  Procedure Laterality Date  . EYE SURGERY Left 2011   cataract  . EYE SURGERY Right 2012    Social History   Social History  . Marital status: Married    Spouse name: N/A  . Number of children: 1  . Years of education: 12+   Occupational History  .  [employer]   Social History Main Topics  . Smoking status: Never Smoker  . Smokeless tobacco: Never Used  . Alcohol use 0.0 oz/week     Comment: 1 or 2 every 3 months   . Drug use: No  . Sexual activity: Not on file   Other Topics Concern  . Not on file   Social History Narrative   Lives at home with wife and stepson.   Caffeine use: none   Has one child.    Current Outpatient Prescriptions on File Prior to Visit  Medication Sig Dispense Refill  . atorvastatin (LIPITOR) 10 MG tablet Take 1 tablet (10 mg total) by mouth daily. 90 tablet 3  . Blood Glucose Monitoring Suppl (BLOOD GLUCOSE METER) kit Use to test blood sugar daily. 1 each 0  . canagliflozin (INVOKANA) 100 MG TABS tablet Take 1 tablet (100 mg total) by mouth daily before breakfast. 90 tablet 3  . glucose blood test strip Use to test fasting blood sugar daily. 100 each 3  . Lancet Devices (LANCING DEVICE) MISC Use to test blood sugar daily. 1 each 0  . Lancets MISC Use to test blood sugar daily. 100 each 3  . lisinopril (PRINIVIL,ZESTRIL) 10 MG tablet Take 1  tablet (10 mg total) by mouth daily. 90 tablet 1  . sitaGLIPtin (JANUVIA) 100 MG tablet Take 1 tablet (100 mg total) by mouth daily. 90 tablet 1  . tadalafil (CIALIS) 10 MG tablet Take 0.5-1 tablets (5-10 mg total) by mouth daily as needed for erectile dysfunction. 10 tablet 1   No current facility-administered medications on file prior to visit.     No Known Allergies  Family History  Problem Relation Age of Onset  . Diabetes Mother   . Cancer Father        leukemia   . Obesity Other     BP 118/80 (BP Location: Left Arm, Patient Position: Sitting, Cuff Size: Large)   Pulse 76   Temp 98.6 F (37 C) (Oral)   Wt 258 lb 4 oz (117.1 kg)   SpO2 98%   BMI 37.06 kg/m   Review of Systems He denies hypoglycemia.  He has lost 7 lbs since last ov.      Objective:   Physical Exam VITAL SIGNS:  See vs page GENERAL: no distress Pulses: foot pulses are intact bilaterally.   MSK: no deformity of the feet or ankles.  CV: no edema of the legs or ankles Skin:  no ulcer on the feet or ankles.  normal color and temp on the feet and ankles Neuro: sensation is intact to touch on the feet and ankles.   Ext: There is bilateral onychomycosis of the toenails.   A1c=7.8%    Assessment & Plan:  Insulin-requiring type 2 DM: goal is a1c <7, and off the glimepiride.   HTN: improved.  Obesity: improved.    Patient Instructions  check your blood sugar once a day.  vary the time of day when you check, between before the 3 meals, and at bedtime.  also check if you have symptoms of your blood sugar being too high or too low.  please keep a record of the readings and bring it to your next appointment here (or you can bring the meter itself).  You can write it on any piece of paper.  please call us sooner if your blood sugar goes below 70, or if you have a lot of readings over 200.   I have sent 2 prescriptions to your pharmacy: to reduce the glimepiride, and to add "bromocriptine."  Please continue the  same other diabetes medications Please come back for a follow-up appointment in 3 months.

## 2017-09-10 NOTE — Patient Instructions (Addendum)
check your blood sugar once a day.  vary the time of day when you check, between before the 3 meals, and at bedtime.  also check if you have symptoms of your blood sugar being too high or too low.  please keep a record of the readings and bring it to your next appointment here (or you can bring the meter itself).  You can write it on any piece of paper.  please call us sooner if your blood sugar goes below 70, or if you have a lot of readings over 200.   I have sent 2 prescriptions to your pharmacy: to reduce the glimepiride, and to add "bromocriptine."  Please continue the same other diabetes medications Please come back for a follow-up appointment in 3 months.

## 2017-10-10 ENCOUNTER — Other Ambulatory Visit: Payer: Self-pay | Admitting: Family Medicine

## 2017-10-10 DIAGNOSIS — I10 Essential (primary) hypertension: Secondary | ICD-10-CM

## 2017-10-10 MED FILL — INVOKANA 100 MG TABLET: 100 | 90 days supply | Qty: 90 | Fill #1

## 2017-10-10 MED FILL — JANUVIA 100 MG TABLET: 100 | 30 days supply | Qty: 30 | Fill #3

## 2017-10-10 MED FILL — GLIMEPIRIDE 1 MG TABLET: 1 | 90 days supply | Qty: 45 | Fill #0

## 2017-10-10 MED FILL — ATORVASTATIN 10 MG TABLET: 10 | 90 days supply | Qty: 90 | Fill #3

## 2017-11-30 ENCOUNTER — Other Ambulatory Visit: Payer: Self-pay | Admitting: Family Medicine

## 2017-11-30 DIAGNOSIS — E1136 Type 2 diabetes mellitus with diabetic cataract: Secondary | ICD-10-CM

## 2017-11-30 DIAGNOSIS — E1165 Type 2 diabetes mellitus with hyperglycemia: Principal | ICD-10-CM

## 2017-11-30 DIAGNOSIS — IMO0002 Reserved for concepts with insufficient information to code with codable children: Secondary | ICD-10-CM

## 2017-11-30 MED FILL — JANUVIA 100 MG TABLET: 100 | 30 days supply | Qty: 30 | Fill #0

## 2017-11-30 MED FILL — BROMOCRIPTINE 2.5 MG TABLET: 2.5 | 88 days supply | Qty: 22 | Fill #1

## 2017-12-11 ENCOUNTER — Encounter: Payer: Self-pay | Admitting: Endocrinology

## 2017-12-11 ENCOUNTER — Ambulatory Visit: Payer: 59 | Admitting: Endocrinology

## 2017-12-11 VITALS — BP 144/91 | HR 68 | Wt 261.4 lb

## 2017-12-11 DIAGNOSIS — E1169 Type 2 diabetes mellitus with other specified complication: Secondary | ICD-10-CM | POA: Diagnosis not present

## 2017-12-11 DIAGNOSIS — E669 Obesity, unspecified: Secondary | ICD-10-CM

## 2017-12-11 LAB — POCT GLYCOSYLATED HEMOGLOBIN (HGB A1C): HEMOGLOBIN A1C: 12.5

## 2017-12-11 MED ORDER — INSULIN GLARGINE 100 UNIT/ML SOLOSTAR PEN: 10.0000 [IU] | PEN_INJECTOR | SUBCUTANEOUS | 99 refills | Status: DC

## 2017-12-11 NOTE — Progress Notes (Signed)
Subjective:    Patient ID: Ian Hughes, male    DOB: [birth year], 37 y.o.   MRN: [id number]  HPI Pt returns for f/u of diabetes mellitus: DM type: 2 Dx'ed: 1287 Complications: none Therapy: 4 oral meds DKA: never Severe hypoglycemia: never Pancreatitis: never Pancreatic imaging:  Other: he has never been on insulin.   Interval history: pt states he feels well in general.  No recent steroids.  He does not check cbg's. Past Medical History:  Diagnosis Date  . Cataract 2011   surgery on R eye  . Diabetes mellitus without complication ([place])   . High cholesterol   . Obesity     Past Surgical History:  Procedure Laterality Date  . EYE SURGERY Left 2011   cataract  . EYE SURGERY Right 2012    Social History   Socioeconomic History  . Marital status: Married    Spouse name: Not on file  . Number of children: 1  . Years of education: 12+  . Highest education level: Not on file  Social Needs  . Financial resource strain: Not on file  . Food insecurity - worry: Not on file  . Food insecurity - inability: Not on file  . Transportation needs - medical: Not on file  . Transportation needs - non-medical: Not on file  Occupational History    Employer: [employer]  Tobacco Use  . Smoking status: Never Smoker  . Smokeless tobacco: Never Used  Substance and Sexual Activity  . Alcohol use: Yes    Alcohol/week: 0.0 oz    Comment: 1 or 2 every 3 months   . Drug use: No  . Sexual activity: Not on file  Other Topics Concern  . Not on file  Social History Narrative   Lives at home with wife and stepson.   Caffeine use: none   Has one child.    Current Outpatient Medications on File Prior to Visit  Medication Sig Dispense Refill  . atorvastatin (LIPITOR) 10 MG tablet Take 1 tablet (10 mg total) by mouth daily. 90 tablet 3  . bromocriptine (PARLODEL) 2.5 MG tablet 1/4 tab daily 25 tablet 3  . canagliflozin (INVOKANA) 100 MG TABS tablet Take 1 tablet (100 mg total) by mouth  daily before breakfast. 90 tablet 3  . glimepiride (AMARYL) 1 MG tablet Take 0.5 tablets (0.5 mg total) by mouth daily with breakfast. 45 tablet 3  . glucose blood test strip Use to test fasting blood sugar daily. 100 each 3  . JANUVIA 100 MG tablet TAKE 1 TABLET (100 MG TOTAL) BY MOUTH DAILY. 90 tablet 1  . Lancet Devices (LANCING DEVICE) MISC Use to test blood sugar daily. 1 each 0  . Lancets MISC Use to test blood sugar daily. 100 each 3  . lisinopril (PRINIVIL,ZESTRIL) 10 MG tablet Take 1 tablet (10 mg total) by mouth daily. Office visit needed for 90 day supply 30 tablet 0  . tadalafil (CIALIS) 10 MG tablet Take 0.5-1 tablets (5-10 mg total) by mouth daily as needed for erectile dysfunction. 10 tablet 1   No current facility-administered medications on file prior to visit.     No Known Allergies  Family History  Problem Relation Age of Onset  . Diabetes Mother   . Cancer Father        leukemia   . Obesity Other     BP (!) 144/91 (BP Location: Left Arm, Patient Position: Sitting, Cuff Size: Normal)   Pulse 68   Wt  261 lb 6.4 oz (118.6 kg)   SpO2 97%   BMI 37.51 kg/m    Review of Systems He denies polyuria and polydipsia.  He has gained a few lbs    Objective:   Physical Exam VITAL SIGNS:  See vs page GENERAL: no distress Pulses: dorsalis pedis intact bilat.   MSK: no deformity of the feet CV: no leg edema.  Skin:  no ulcer on the feet.  normal color and temp on the feet. Neuro: sensation is intact to touch on the feet.    A1c=12.5%  I demonstrated insulin pen.  He says he can operate now.     Assessment & Plan:  Type 2 DM: much worse.  I advised ref DM educator, but he declines.  Noncompliance with cbg recording: the much higher a1c is a surprise: I offered to check lab a1c or fructosamine, but he declines  Patient Instructions  check your blood sugar once a day.  vary the time of day when you check, between before the 3 meals, and at bedtime.  also check if  you have symptoms of your blood sugar being too high or too low.  please keep a record of the readings and bring it to your next appointment here (or you can bring the meter itself).  You can write it on any piece of paper.  please call us sooner if your blood sugar goes below 70, or if you have a lot of readings over 200.   I have sent a prescription to your pharmacy, to start insulin.  Please continue the same other diabetes medications. This is probably not enough, so please call or message [place] next week, to tell us how the blood sugar is doing. Please come back for a follow-up appointment in 1 month.

## 2017-12-11 NOTE — Patient Instructions (Addendum)
check your blood sugar once a day.  vary the time of day when you check, between before the 3 meals, and at bedtime.  also check if you have symptoms of your blood sugar being too high or too low.  please keep a record of the readings and bring it to your next appointment here (or you can bring the meter itself).  You can write it on any piece of paper.  please call us sooner if your blood sugar goes below 70, or if you have a lot of readings over 200.   I have sent a prescription to your pharmacy, to start insulin.  Please continue the same other diabetes medications. This is probably not enough, so please call or message [place] next week, to tell us how the blood sugar is doing. Please come back for a follow-up appointment in 1 month.

## 2018-01-11 ENCOUNTER — Encounter: Payer: Self-pay | Admitting: Endocrinology

## 2018-01-11 ENCOUNTER — Ambulatory Visit: Payer: 59 | Admitting: Endocrinology

## 2018-01-11 DIAGNOSIS — E1165 Type 2 diabetes mellitus with hyperglycemia: Secondary | ICD-10-CM

## 2018-01-11 DIAGNOSIS — E1136 Type 2 diabetes mellitus with diabetic cataract: Secondary | ICD-10-CM

## 2018-01-11 DIAGNOSIS — IMO0002 Reserved for concepts with insufficient information to code with codable children: Secondary | ICD-10-CM

## 2018-01-11 MED ORDER — GLIMEPIRIDE 1 MG PO TABS: 0.5000 mg | ORAL_TABLET | Freq: Every day | ORAL | 3 refills | Status: DC

## 2018-01-11 MED ORDER — SITAGLIPTIN PHOSPHATE 100 MG PO TABS: ORAL_TABLET | ORAL | 1 refills | Status: DC

## 2018-01-11 MED ORDER — BROMOCRIPTINE MESYLATE 2.5 MG PO TABS: ORAL_TABLET | ORAL | 3 refills | Status: DC

## 2018-01-11 MED ORDER — CANAGLIFLOZIN 100 MG PO TABS: 100.0000 mg | ORAL_TABLET | Freq: Every day | ORAL | 3 refills | Status: DC

## 2018-01-11 MED ORDER — INSULIN GLARGINE 100 UNIT/ML SOLOSTAR PEN: 10.0000 [IU] | PEN_INJECTOR | SUBCUTANEOUS | 99 refills | Status: DC

## 2018-01-11 MED FILL — JANUVIA 100 MG TABLET: 100 | 30 days supply | Qty: 30 | Fill #1

## 2018-01-11 MED FILL — LANTUS SOLOSTAR 100 UNITS/M: 100 | 30 days supply | Qty: 3 | Fill #0

## 2018-01-11 MED FILL — INVOKANA 100 MG TABLET: 100 | 90 days supply | Qty: 90 | Fill #2

## 2018-01-11 MED FILL — GLIMEPIRIDE 1 MG TABLET: 1 | 90 days supply | Qty: 45 | Fill #1

## 2018-01-11 NOTE — Progress Notes (Signed)
Subjective:    Patient ID: Ian Hughes, male    DOB: [birth year], 37 y.o.   MRN: [id number]  HPI Pt returns for f/u of diabetes mellitus: DM type: Insulin-requiring type 2.  Dx'ed: 3267 Complications: none Therapy: 4 oral meds (he did not start rx'ed insulin) DKA: never Severe hypoglycemia: never Pancreatitis: never Pancreatic imaging: never Other: he has never been on insulin.   Interval history: pt states he feels well in general.  He does not check cbg's.  He did not start the insulin, as he is concerned doing so would cause dependence.   Past Medical History:  Diagnosis Date  . Cataract 2011   surgery on R eye  . Diabetes mellitus without complication ([place])   . High cholesterol   . Obesity     Past Surgical History:  Procedure Laterality Date  . EYE SURGERY Left 2011   cataract  . EYE SURGERY Right 2012    Social History   Socioeconomic History  . Marital status: Married    Spouse name: Not on file  . Number of children: 1  . Years of education: 12+  . Highest education level: Not on file  Social Needs  . Financial resource strain: Not on file  . Food insecurity - worry: Not on file  . Food insecurity - inability: Not on file  . Transportation needs - medical: Not on file  . Transportation needs - non-medical: Not on file  Occupational History    Employer: [employer]  Tobacco Use  . Smoking status: Never Smoker  . Smokeless tobacco: Never Used  Substance and Sexual Activity  . Alcohol use: Yes    Alcohol/week: 0.0 oz    Comment: 1 or 2 every 3 months   . Drug use: No  . Sexual activity: Not on file  Other Topics Concern  . Not on file  Social History Narrative   Lives at home with wife and stepson.   Caffeine use: none   Has one child.    Current Outpatient Medications on File Prior to Visit  Medication Sig Dispense Refill  . atorvastatin (LIPITOR) 10 MG tablet Take 1 tablet (10 mg total) by mouth daily. 90 tablet 3  . glucose blood test  strip Use to test fasting blood sugar daily. 100 each 3  . Lancet Devices (LANCING DEVICE) MISC Use to test blood sugar daily. 1 each 0  . Lancets MISC Use to test blood sugar daily. 100 each 3  . tadalafil (CIALIS) 10 MG tablet Take 0.5-1 tablets (5-10 mg total) by mouth daily as needed for erectile dysfunction. 10 tablet 1  . lisinopril (PRINIVIL,ZESTRIL) 10 MG tablet Take 1 tablet (10 mg total) by mouth daily. Office visit needed for 90 day supply (Patient not taking: Reported on 01/11/2018) 30 tablet 0   No current facility-administered medications on file prior to visit.     No Known Allergies  Family History  Problem Relation Age of Onset  . Diabetes Mother   . Cancer Father        leukemia   . Obesity Other     BP 138/90 (BP Location: Left Arm, Patient Position: Sitting, Cuff Size: Normal)   Pulse 83   Wt 259 lb (117.5 kg)   SpO2 97%   BMI 37.16 kg/m    Review of Systems He has lost 2 lbs.      Objective:   Physical Exam VITAL SIGNS:  See vs page GENERAL: no distress Pulses: dorsalis pedis  intact bilat.   MSK: no deformity of the feet CV: no leg edema Skin:  no ulcer on the feet.  normal color and temp on the feet. Neuro: sensation is intact to touch on the feet.     Lab Results  Component Value Date   HGBA1C 12.5 12/11/2017      Assessment & Plan:  Insulin-requiring type 2 DM: therapy limited by noncompliance.  i'll do the best i can.    Patient Instructions  check your blood sugar once a day.  vary the time of day when you check, between before the 3 meals, and at bedtime.  also check if you have symptoms of your blood sugar being too high or too low.  please keep a record of the readings and bring it to your next appointment here (or you can bring the meter itself).  You can write it on any piece of paper.  please call us sooner if your blood sugar goes below 70, or if you have a lot of readings over 200.   I have resent a prescription to your pharmacy, to  start insulin.  Please continue the same other diabetes medications. This is probably not enough insulin, so please call or message [place] next week, to tell us how the blood sugar is doing. Please come back for a follow-up appointment in 1 month.

## 2018-01-11 NOTE — Patient Instructions (Addendum)
check your blood sugar once a day.  vary the time of day when you check, between before the 3 meals, and at bedtime.  also check if you have symptoms of your blood sugar being too high or too low.  please keep a record of the readings and bring it to your next appointment here (or you can bring the meter itself).  You can write it on any piece of paper.  please call us sooner if your blood sugar goes below 70, or if you have a lot of readings over 200.   I have resent a prescription to your pharmacy, to start insulin.  Please continue the same other diabetes medications. This is probably not enough insulin, so please call or message [place] next week, to tell us how the blood sugar is doing. Please come back for a follow-up appointment in 1 month.

## 2018-01-15 MED FILL — UNIFINE PENTIPS 8MM 31G: 31G X 8 MM | 90 days supply | Qty: 100 | Fill #0

## 2018-02-07 ENCOUNTER — Ambulatory Visit: Payer: 59 | Admitting: Endocrinology

## 2018-02-07 DIAGNOSIS — Z0289 Encounter for other administrative examinations: Secondary | ICD-10-CM

## 2018-06-24 DIAGNOSIS — R293 Abnormal posture: Secondary | ICD-10-CM | POA: Diagnosis not present

## 2018-06-24 DIAGNOSIS — M2560 Stiffness of unspecified joint, not elsewhere classified: Secondary | ICD-10-CM | POA: Diagnosis not present

## 2018-06-24 DIAGNOSIS — M546 Pain in thoracic spine: Secondary | ICD-10-CM | POA: Diagnosis not present

## 2018-06-24 DIAGNOSIS — M9902 Segmental and somatic dysfunction of thoracic region: Secondary | ICD-10-CM | POA: Diagnosis not present

## 2018-06-26 DIAGNOSIS — M2560 Stiffness of unspecified joint, not elsewhere classified: Secondary | ICD-10-CM | POA: Diagnosis not present

## 2018-06-26 DIAGNOSIS — R293 Abnormal posture: Secondary | ICD-10-CM | POA: Diagnosis not present

## 2018-06-26 DIAGNOSIS — M9902 Segmental and somatic dysfunction of thoracic region: Secondary | ICD-10-CM | POA: Diagnosis not present

## 2018-06-26 DIAGNOSIS — M546 Pain in thoracic spine: Secondary | ICD-10-CM | POA: Diagnosis not present

## 2018-07-01 DIAGNOSIS — M9902 Segmental and somatic dysfunction of thoracic region: Secondary | ICD-10-CM | POA: Diagnosis not present

## 2018-07-01 DIAGNOSIS — R293 Abnormal posture: Secondary | ICD-10-CM | POA: Diagnosis not present

## 2018-07-01 DIAGNOSIS — M2560 Stiffness of unspecified joint, not elsewhere classified: Secondary | ICD-10-CM | POA: Diagnosis not present

## 2018-07-01 DIAGNOSIS — M546 Pain in thoracic spine: Secondary | ICD-10-CM | POA: Diagnosis not present

## 2018-07-03 DIAGNOSIS — R293 Abnormal posture: Secondary | ICD-10-CM | POA: Diagnosis not present

## 2018-07-03 DIAGNOSIS — M9902 Segmental and somatic dysfunction of thoracic region: Secondary | ICD-10-CM | POA: Diagnosis not present

## 2018-07-03 DIAGNOSIS — M546 Pain in thoracic spine: Secondary | ICD-10-CM | POA: Diagnosis not present

## 2018-07-03 DIAGNOSIS — M2560 Stiffness of unspecified joint, not elsewhere classified: Secondary | ICD-10-CM | POA: Diagnosis not present

## 2018-07-04 DIAGNOSIS — M546 Pain in thoracic spine: Secondary | ICD-10-CM | POA: Diagnosis not present

## 2018-07-04 DIAGNOSIS — M2560 Stiffness of unspecified joint, not elsewhere classified: Secondary | ICD-10-CM | POA: Diagnosis not present

## 2018-07-04 DIAGNOSIS — R293 Abnormal posture: Secondary | ICD-10-CM | POA: Diagnosis not present

## 2018-07-04 DIAGNOSIS — M9902 Segmental and somatic dysfunction of thoracic region: Secondary | ICD-10-CM | POA: Diagnosis not present

## 2018-07-10 DIAGNOSIS — M9902 Segmental and somatic dysfunction of thoracic region: Secondary | ICD-10-CM | POA: Diagnosis not present

## 2018-07-10 DIAGNOSIS — R293 Abnormal posture: Secondary | ICD-10-CM | POA: Diagnosis not present

## 2018-07-10 DIAGNOSIS — M546 Pain in thoracic spine: Secondary | ICD-10-CM | POA: Diagnosis not present

## 2018-07-10 DIAGNOSIS — M2560 Stiffness of unspecified joint, not elsewhere classified: Secondary | ICD-10-CM | POA: Diagnosis not present

## 2018-07-11 DIAGNOSIS — M2560 Stiffness of unspecified joint, not elsewhere classified: Secondary | ICD-10-CM | POA: Diagnosis not present

## 2018-07-11 DIAGNOSIS — R293 Abnormal posture: Secondary | ICD-10-CM | POA: Diagnosis not present

## 2018-07-11 DIAGNOSIS — M546 Pain in thoracic spine: Secondary | ICD-10-CM | POA: Diagnosis not present

## 2018-07-11 DIAGNOSIS — M9902 Segmental and somatic dysfunction of thoracic region: Secondary | ICD-10-CM | POA: Diagnosis not present

## 2019-03-12 ENCOUNTER — Other Ambulatory Visit: Payer: Self-pay

## 2019-03-12 ENCOUNTER — Emergency Department (HOSPITAL_COMMUNITY)
Admission: EM | Admit: 2019-03-12 | Discharge: 2019-03-13 | Disposition: A | Discharge status: Home / Self Care | Payer: 59 | Attending: Emergency Medicine | Admitting: Emergency Medicine

## 2019-03-12 ENCOUNTER — Encounter (HOSPITAL_COMMUNITY): Payer: Self-pay

## 2019-03-12 DIAGNOSIS — R509 Fever, unspecified: Secondary | ICD-10-CM

## 2019-03-12 DIAGNOSIS — Z794 Long term (current) use of insulin: Secondary | ICD-10-CM | POA: Diagnosis not present

## 2019-03-12 DIAGNOSIS — R6889 Other general symptoms and signs: Secondary | ICD-10-CM

## 2019-03-12 DIAGNOSIS — E119 Type 2 diabetes mellitus without complications: Secondary | ICD-10-CM | POA: Insufficient documentation

## 2019-03-12 DIAGNOSIS — Z79899 Other long term (current) drug therapy: Secondary | ICD-10-CM | POA: Diagnosis not present

## 2019-03-12 DIAGNOSIS — Z20828 Contact with and (suspected) exposure to other viral communicable diseases: Secondary | ICD-10-CM | POA: Diagnosis not present

## 2019-03-12 DIAGNOSIS — Z20822 Contact with and (suspected) exposure to covid-19: Secondary | ICD-10-CM

## 2019-03-12 LAB — COMPREHENSIVE METABOLIC PANEL
ALT: 26 U/L (ref 0–44)
AST: 25 U/L (ref 15–41)
Albumin: 3.6 g/dL (ref 3.5–5.0)
Alkaline Phosphatase: 91 U/L (ref 38–126)
Anion gap: 13 (ref 5–15)
BUN: 15 mg/dL (ref 6–20)
CO2: 22 mmol/L (ref 22–32)
Calcium: 8.3 mg/dL — ABNORMAL LOW (ref 8.9–10.3)
Chloride: 98 mmol/L (ref 98–111)
Creatinine, Ser: 0.93 mg/dL (ref 0.61–1.24)
GFR calc Af Amer: >60 mL/min (ref >60)
GFR calc non Af Amer: >60 mL/min (ref >60)
Glucose, Bld: 304 mg/dL — ABNORMAL HIGH (ref 70–99)
Potassium: 3.6 mmol/L (ref 3.5–5.1)
Sodium: 133 mmol/L — ABNORMAL LOW (ref 135–145)
Total Bilirubin: 1.1 mg/dL (ref 0.3–1.2)
Total Protein: 7 g/dL (ref 6.5–8.1)

## 2019-03-12 LAB — CBC WITH DIFFERENTIAL/PLATELET
Basophils Absolute: 0 10*3/uL (ref 0.0–0.1)
Basophils Relative: 0 %
Eosinophils Absolute: 0 10*3/uL (ref 0.0–0.5)
Eosinophils Relative: 0 %
HCT: 47.8 % (ref 39.0–52.0)
Hemoglobin: 16.2 g/dL (ref 13.0–17.0)
Lymphocytes Relative: 20 %
Lymphs Abs: 0.7 10*3/uL (ref 0.7–4.0)
MCH: 28.2 pg (ref 26.0–34.0)
MCHC: 33.9 g/dL (ref 30.0–36.0)
MCV: 83.3 fL (ref 80.0–100.0)
Monocytes Absolute: 0.2 10*3/uL (ref 0.1–1.0)
Monocytes Relative: 6 %
Neutro Abs: 2.7 10*3/uL (ref 1.7–7.7)
Neutrophils Relative %: 73 %
Platelets: 152 10*3/uL (ref 150–400)
RBC: 5.74 MIL/uL (ref 4.22–5.81)
RDW: 12.1 % (ref 11.5–15.5)
WBC: 3.6 10*3/uL — ABNORMAL LOW (ref 4.0–10.5)

## 2019-03-12 LAB — SARS CORONAVIRUS 2 BY RT PCR (HOSPITAL ORDER, PERFORMED IN ~~LOC~~ HOSPITAL LAB): SARS Coronavirus 2: POSITIVE — AB

## 2019-03-12 MED ORDER — ACETAMINOPHEN 325 MG PO TABS
650.0000 mg | ORAL_TABLET | Freq: Once | ORAL | Status: AC
  Administered 2019-03-12: 650 mg via ORAL
  Filled 2019-03-12: qty 2

## 2019-03-12 MED ORDER — SODIUM CHLORIDE 0.9 % IV BOLUS
1000.0000 mL | Freq: Once | INTRAVENOUS | Status: AC
  Administered 2019-03-12: 22:00:00 1000 mL via INTRAVENOUS

## 2019-03-12 NOTE — ED Provider Notes (Signed)
[place] DEPT Provider Note   CSN: [id number] Arrival date & time: 03/12/19  2020    History   Chief Complaint Chief Complaint  Patient presents with   Fever    HPI Ian Hughes is a 38 y.o. male.     The history is provided by the patient.  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  pt also with DM but has not had treatment or checked blood sugar in over a year.   Past Medical History:  Diagnosis Date   Cataract 2011   surgery on R eye   Diabetes mellitus without complication ([place])    High cholesterol    Obesity     Patient Active Problem List   Diagnosis Date Noted   UARS (upper airway resistance syndrome) 04/07/2015   Severe obesity (BMI >= 40) ([facility]) 04/07/2015   Primary snoring 04/07/2015   Snoring 12/21/2014   Drowsiness 12/21/2014   Witnessed apneic spells 12/21/2014   ED (erectile dysfunction) 09/24/2012   Diabetes mellitus type 2 in obese ([place]) 07/04/2009    Class: Chronic   OBESITY, NOS 01/17/2007   BELLS PALSY 01/17/2007   HYPERTENSION, BENIGN SYSTEMIC 01/17/2007    Past Surgical History:  Procedure Laterality Date   EYE SURGERY Left 2011   cataract   EYE SURGERY Right 2012        Home Medications    Prior to Admission medications   Medication Sig Start Date End Date Taking? Authorizing Provider  atorvastatin (LIPITOR) 10 MG tablet Take 1 tablet (10 mg total) by  mouth daily. 01/12/17   Jaynee Eagles, PA-C  bromocriptine (PARLODEL) 2.5 MG tablet 1/4 tab daily 01/11/18   Renato Shin, MD  canagliflozin [facility]) 100 MG TABS tablet Take 1 tablet (100 mg total) by mouth daily before breakfast. 01/11/18   Renato Shin, MD  glimepiride (AMARYL) 1 MG tablet Take 0.5 tablets (0.5 mg total) by mouth daily with breakfast. 01/11/18   Renato Shin, MD  glucose blood test strip Use to test fasting blood sugar daily. 01/07/14   Barton Fanny, MD  Insulin Glargine (LANTUS SOLOSTAR) 100 UNIT/ML Solostar Pen Inject 10 Units into the skin every morning. And pen needles 1/day 01/11/18   Renato Shin, MD  Lancet Devices (LANCING DEVICE) MISC Use to test blood sugar daily. 01/07/14   Barton Fanny, MD  Lancets MISC Use to test blood sugar daily. 01/07/14   Barton Fanny, MD  lisinopril (PRINIVIL,ZESTRIL) 10 MG tablet Take 1 tablet (10 mg total) by mouth daily. Office visit needed for 90 day supply Patient not taking: Reported on 01/11/2018 10/15/17   Wendie Agreste, MD  sitaGLIPtin (JANUVIA) 100 MG tablet TAKE 1 TABLET (100 MG TOTAL) BY MOUTH DAILY. 01/11/18   Renato Shin, MD  tadalafil (CIALIS) 10 MG tablet Take 0.5-1 tablets (5-10 mg total) by mouth daily as needed for erectile dysfunction. 06/22/16   Wendie Agreste, MD    Family History Family History  Problem Relation Age of Onset   Diabetes Mother    Cancer Father        leukemia    Obesity Other     Social History Social History   Tobacco Use   Smoking status: Never Smoker   Smokeless tobacco: Never Used  Substance Use Topics   Alcohol use: Yes    Alcohol/week: 0.0 standard drinks    Comment: 1 or 2 every 3 months    Drug use: No     Allergies   Patient has no known allergies.   Review of Systems Review of Systems  Constitutional: Positive for chills and fever.  HENT: Negative for congestion and rhinorrhea.   Respiratory: Positive for cough and shortness of breath.          Mild shortness of breath while walking to his car when he left work  Gastrointestinal: Positive for nausea and vomiting. Negative for diarrhea.  Genitourinary: Negative for dysuria.  Musculoskeletal: Positive for myalgias.  Neurological: Positive for headaches.  All other systems reviewed and are negative.    Physical Exam Updated Vital Signs BP 140/85 (BP Location: Left Arm)    Pulse (!) 107    Temp 100.2 F (37.9 C) (Oral)    Resp 16    SpO2 95%   Physical Exam Vitals signs and nursing note reviewed.  Constitutional:      General: He is not in acute distress.    Appearance: He is well-developed.  HENT:     Head: Normocephalic and atraumatic.     Nose: Nose normal.     Mouth/Throat:     Mouth: Mucous membranes are moist.  Eyes:     Conjunctiva/sclera: Conjunctivae normal.     Pupils: Pupils are equal, round, and reactive to light.  Neck:     Musculoskeletal: Normal range of motion and neck supple.  Cardiovascular:     Rate and Rhythm: Regular rhythm. Tachycardia present.     Heart sounds: No murmur.  Pulmonary:     Effort: Pulmonary effort is normal. No respiratory distress.     Breath sounds: Normal breath sounds. No wheezing or rales.  Abdominal:     General: There is no distension.     Palpations: Abdomen is soft.     Tenderness: There is no abdominal tenderness. There is no guarding or rebound.  Musculoskeletal: Normal range of motion.        General: No tenderness.  Skin:    General: Skin is warm and dry.     Findings: No erythema or rash.  Neurological:     General: No focal deficit present.     Mental Status: He is alert and oriented to person, place, and time. Mental status is at baseline.  Psychiatric:        Mood and Affect: Mood normal.        Behavior: Behavior normal.        Thought Content: Thought content normal.      ED Treatments / Results  Labs (all labs ordered are listed, but only abnormal results are displayed) Labs Reviewed  CBC  WITH DIFFERENTIAL/PLATELET - Abnormal; Notable for the following components:      Result Value   WBC 3.6 (*)    All other components within normal limits  COMPREHENSIVE METABOLIC PANEL - Abnormal; Notable for the following components:   Sodium 133 (*)    Glucose, Bld 304 (*)    Calcium 8.3 (*)    All other components within normal  limits  SARS CORONAVIRUS 2 (HOSPITAL ORDER, [facility])  URINALYSIS, ROUTINE W REFLEX MICROSCOPIC    EKG None  Radiology No results found.  Procedures Procedures (including critical care time)  Medications Ordered in ED Medications  sodium chloride 0.9 % bolus 1,000 mL (has no administration in time range)  acetaminophen (TYLENOL) tablet 650 mg (has no administration in time range)     Initial Impression / Assessment and Plan / ED Course  I have reviewed the triage vital signs and the nursing notes.  Pertinent labs & imaging results that were available during my care of the patient were reviewed by me and considered in my medical decision making (see chart for details).        It is a 38 year old male with a history of diabetes but has not been treated in approximately 1 year who is presenting with flulike illness that started last night.  He is having general malaise, fatigue, occasional cough, fever, myalgias, one episode of vomiting.  He has no abdominal pain.  He only felt short of breath when walking to his car today after work.  Patient does work at [employer] in [place] area.  Many he has high risk for COVID exposure.  He is otherwise well-appearing at this time.  Oxygen saturation is 95% on room air and respiratory rate is 16.  Lung sounds are clear at this time.  Because patient has not had his diabetes checked or treated in a year we will do lab work to ensure no other acute issues such as new renal disease or extreme hyperglycemia.  Patient is not having symptoms classic for DKA at this time.  He  was given IV fluids.  Breath sounds are clear.  Send out COVID testing ordered.  10:53 PM Has mild leukopenia of 3.6 but otherwise normal CBC.  CMP with blood sugar of 304 but no evidence of an anion gap and low concern at this point for DKA.  Patient felt better after fluids.  Vital signs remained stable.  COVID test pending.  Will discharge patient home to quarantine.  Ian Hughes was evaluated in Emergency Department on 03/12/2019 for the symptoms described in the history of present illness. He was evaluated in the context of the global COVID-19 pandemic, which necessitated consideration that the patient might be at risk for infection with the SARS-CoV-2 virus that causes COVID-19. Institutional protocols and algorithms that pertain to the evaluation of patients at risk for COVID-19 are in a state of rapid change based on information released by regulatory bodies including the CDC and federal and state organizations. These policies and algorithms were followed during the patient's care in the ED.   Final Clinical Impressions(s) / ED Diagnoses   Final diagnoses:  Suspected Covid-19 Virus Infection  Febrile illness    ED Discharge Orders    None       Blanchie Dessert, MD 03/12/19 2305

## 2019-03-12 NOTE — Discharge Instructions (Signed)
In about 7 days from now if you start having worsening shortness of breath even when sitting or start feeling worse or vomiting becomes constant.  Take tylenol as needed for fever.

## 2019-03-12 NOTE — ED Triage Notes (Signed)
Pt reports malaise, fever, and chills that started this morning. Reports one episode of bilious vomiting. Reports mild headache.

## 2019-03-13 NOTE — ED Notes (Signed)
AC notified of + COVID-19 test states will contact ID

## 2019-03-21 ENCOUNTER — Telehealth (INDEPENDENT_AMBULATORY_CARE_PROVIDER_SITE_OTHER): Payer: 59 | Admitting: Family Medicine

## 2019-03-21 ENCOUNTER — Other Ambulatory Visit: Payer: Self-pay

## 2019-03-21 DIAGNOSIS — E669 Obesity, unspecified: Secondary | ICD-10-CM

## 2019-03-21 DIAGNOSIS — IMO0001 Reserved for inherently not codable concepts without codable children: Secondary | ICD-10-CM

## 2019-03-21 DIAGNOSIS — I10 Essential (primary) hypertension: Secondary | ICD-10-CM

## 2019-03-21 DIAGNOSIS — E1165 Type 2 diabetes mellitus with hyperglycemia: Secondary | ICD-10-CM | POA: Diagnosis not present

## 2019-03-21 DIAGNOSIS — IMO0002 Reserved for concepts with insufficient information to code with codable children: Secondary | ICD-10-CM

## 2019-03-21 DIAGNOSIS — E1136 Type 2 diabetes mellitus with diabetic cataract: Secondary | ICD-10-CM

## 2019-03-21 MED ORDER — LISINOPRIL 5 MG PO TABS: 5.0000 mg | ORAL_TABLET | Freq: Every day | ORAL | 3 refills | Status: DC

## 2019-03-21 MED ORDER — INSULIN GLARGINE 100 UNIT/ML SOLOSTAR PEN: 10.0000 [IU] | PEN_INJECTOR | SUBCUTANEOUS | 99 refills | Status: DC

## 2019-03-21 MED FILL — UNIFINE PENTIPS 8MM 31G: 31G X 8 MM | 90 days supply | Qty: 100 | Fill #0

## 2019-03-21 MED FILL — LISINOPRIL 5 MG TABLET: 5 | 90 days supply | Qty: 90 | Fill #0

## 2019-03-21 MED FILL — LANTUS SOLOSTAR 100 UNITS/M: 100 | 30 days supply | Qty: 3 | Fill #0

## 2019-03-21 NOTE — Progress Notes (Signed)
CC-Diabetes/Covid19 f/u-Patient have not been seen by Internal medicine in over a year. Need to get back on track with diabetes. Last A1co was done on 12/11/2017 was 12.5. Patient need refill on all medication pended labs has also been put in as future order.  Patient was also sen at Ed on 03/12/19 Dx with [id number] with a positive SAR'S test swab was positive. Patient states he is feeling much better. Little sob on occasions, denies fever, body ache at this time still have a little cough. Not sure when you would like for patient to come into the office since he was positive for Covid. Patient have not went back to work yet. One option is when patient return to work he can get labs there or just stop by office when clear of covid.

## 2019-03-21 NOTE — Progress Notes (Signed)
Virtual Visit via telephone Note (he did not log in for video)  I connected with [name] on 03/21/19 at 4:43 PM by a phone. verified that I am speaking with the correct person using two identifiers.   I discussed the limitations, risks, security and privacy concerns of performing an evaluation and management service by telephone and the availability of in person appointments. I also discussed with the patient that there may be a patient responsible charge related to this service. The patient expressed understanding and agreed to proceed, consent obtained  Chief complaint: covid 19 infection Diabetes History of Present Illness: Ian Hughes is a 38 y.o. male Follow-up of COVID-19 infection and chronic medical issues. I have unfortunately not seen him since May 2018.  Other than endocrine has not had care by other practitioner.   Has been trying to do herbal medications.  Not helping.    Covid -19 infection: Evaluated in ER 4/22 with positive testing, WBC 3.6 at that time.  Symptom present for 1 day at that time, cough headache myalgias nausea and vomiting, chills, mild shortness of breath walking to the car. O2 sat 95%.  Temp 100.2. IV fluids were given, labs obtained to make sure he is not in DKA.  Breath sounds were clear. Past 3-4 days have been feeling better.  No recent fever- last measured about 6 days ago.  Getting better each day, only dyspnea with exertion overdoing it.  Still on tylenol.  Eating and drinking ok.  Has not returned to work.    Diabetes:  Had been followed by Dr. Loanne Drilling previously for diabetes, but it appears last office visit with Dr. Loanne Drilling was in February 2019. Listed as type II, insulin requiring, but was on for oral medications as he did not start the prescribed insulin -per notes was concerned with causing dependence if he did start insulin.  Was not checking CBGs at that time. Complicated by cataract of right eye, and therapy limited by  noncompliance. Insulin was again sent to his pharmacy in addition to continuing glimepiride, Invokana, Januvia.  Glucose 304 at the ER visit 9 days ago. CO2 normal at 22, no anion gap.  Has not called endocrine for follow up.  Not on any meds at this time. does want to restart meds.  Not checking home blood sugars, but has meter at home. No n/v. No blurry vision, some urinary frequency, but with increased fluids with recent illness. Was using insulin in past and tolerated ok.   No recent lisinopril and lipitor.  BP 140/85 in ER. No home meter, no home readings.   Lab Results  Component Value Date   HGBA1C 12.5 12/11/2017   HGBA1C 7.8 09/10/2017   HGBA1C 7.0 07/10/2017   Lab Results  Component Value Date   MICROALBUR 0.8 06/22/2016   LDLCALC 59 04/07/2017   CREATININE 0.93 03/12/2019         Patient Active Problem List   Diagnosis Date Noted  . UARS (upper airway resistance syndrome) 04/07/2015  . Severe obesity (BMI >= 40) ([place]) 04/07/2015  . Primary snoring 04/07/2015  . Snoring 12/21/2014  . Drowsiness 12/21/2014  . Witnessed apneic spells 12/21/2014  . ED (erectile dysfunction) 09/24/2012  . Diabetes mellitus type 2 in obese ([place]) 07/04/2009    Class: Chronic  . OBESITY, NOS 01/17/2007  . BELLS PALSY 01/17/2007  . HYPERTENSION, BENIGN SYSTEMIC 01/17/2007   Past Medical History:  Diagnosis Date  . Cataract 2011   surgery on R eye  .  Diabetes mellitus without complication ([place])   . High cholesterol   . Obesity    Past Surgical History:  Procedure Laterality Date  . EYE SURGERY Left 2011   cataract  . EYE SURGERY Right 2012   No Known Allergies Prior to Admission medications   Medication Sig Start Date End Date Taking? Authorizing Provider  atorvastatin (LIPITOR) 10 MG tablet Take 1 tablet (10 mg total) by mouth daily. Patient not taking: Reported on 03/21/2019 01/12/17   Jaynee Eagles, PA-C  bromocriptine (PARLODEL) 2.5 MG tablet 1/4 tab daily Patient not  taking: Reported on 03/12/2019 01/11/18   Renato Shin, MD  canagliflozin [facility]) 100 MG TABS tablet Take 1 tablet (100 mg total) by mouth daily before breakfast. Patient not taking: Reported on 03/12/2019 01/11/18   Renato Shin, MD  glimepiride (AMARYL) 1 MG tablet Take 0.5 tablets (0.5 mg total) by mouth daily with breakfast. Patient not taking: Reported on 03/12/2019 01/11/18   Renato Shin, MD  glucose blood test strip Use to test fasting blood sugar daily. Patient not taking: Reported on 03/21/2019 01/07/14   Barton Fanny, MD  Insulin Glargine (LANTUS SOLOSTAR) 100 UNIT/ML Solostar Pen Inject 10 Units into the skin every morning. And pen needles 1/day Patient not taking: Reported on 03/12/2019 01/11/18   Renato Shin, MD  Lancet Devices (LANCING DEVICE) MISC Use to test blood sugar daily. Patient not taking: Reported on 03/21/2019 01/07/14   Barton Fanny, MD  Lancets MISC Use to test blood sugar daily. Patient not taking: Reported on 03/21/2019 01/07/14   Barton Fanny, MD  lisinopril (PRINIVIL,ZESTRIL) 10 MG tablet Take 1 tablet (10 mg total) by mouth daily. Office visit needed for 90 day supply Patient not taking: Reported on 01/11/2018 10/15/17   Wendie Agreste, MD  sitaGLIPtin (JANUVIA) 100 MG tablet TAKE 1 TABLET (100 MG TOTAL) BY MOUTH DAILY. Patient not taking: Reported on 03/12/2019 01/11/18   Renato Shin, MD   Social History   Socioeconomic History  . Marital status: Married    Spouse name: Not on file  . Number of children: 1  . Years of education: 12+  . Highest education level: Not on file  Occupational History    Employer: [employer]  . Financial resource strain: Not on file  . Food insecurity:    Worry: Not on file    Inability: Not on file  . Transportation needs:    Medical: Not on file    Non-medical: Not on file  Tobacco Use  . Smoking status: Never Smoker  . Smokeless tobacco: Never Used  Substance and Sexual Activity  .  Alcohol use: Yes    Alcohol/week: 0.0 standard drinks    Comment: 1 or 2 every 3 months   . Drug use: No  . Sexual activity: Not on file  Lifestyle  . Physical activity:    Days per week: Not on file    Minutes per session: Not on file  . Stress: Not on file  Relationships  . Social connections:    Talks on phone: Not on file    Gets together: Not on file    Attends religious service: Not on file    Active member of club or organization: Not on file    Attends meetings of clubs or organizations: Not on file    Relationship status: Not on file  . Intimate partner violence:    Fear of current or ex partner: Not on file  Emotionally abused: Not on file    Physically abused: Not on file    Forced sexual activity: Not on file  Other Topics Concern  . Not on file  Social History Narrative   Lives at home with wife and stepson.   Caffeine use: none   Has one child.    Observations/Objective:   Assessment and Plan: Uncontrolled type 2 diabetes mellitus without complication, without long-term current use of insulin (HCC) - Plan: atorvastatin (LIPITOR) 10 MG tablet, Hemoglobin A1c  Essential hypertension - Plan: atorvastatin (LIPITOR) 10 MG tablet, lisinopril (ZESTRIL) 10 MG tablet, Comprehensive metabolic panel  Obesity, unspecified classification, unspecified obesity type, unspecified whether serious comorbidity present - Plan: atorvastatin (LIPITOR) 10 MG tablet  Uncontrolled type 2 diabetes mellitus with diabetic cataract, without long-term current use of insulin (HCC) - Plan: sitaGLIPtin (JANUVIA) 100 MG tablet   Follow Up Instructions:     Patient Instructions    Good talking to you today.  Glad to hear that we can get back on track with some of the medications.  I am also happy to hear that you are improving from the recent COVID-19 infection.  As we discussed if there is acute worsening including shortness of breath at rest, confusion, inability to drink fluids he  may need to be seen in the emergency room.  If you are having worsening symptoms please let me now.  For diabetes, check your blood sugar fasting and one other time during the day 2 hours after a meal.  Will restart Lantus at 10 units once per day but if your sugars remain over 200 then increase by 2 units every 3 days until those blood sugars get below 200 and then remain at that dose.  Watch for low blood sugar symptoms with using insulin as we discussed and if those occur may need to back off the dose.  Make sure not to skip meals when using insulin.  See other information below and I will talk to you next week.  I restarted your blood pressure medication, but half the previous dose to make sure we do not put you on too much medicine.  We can discuss this further at follow-up visit.  Return to the clinic or go to the nearest emergency room if any of your symptoms worsen or new symptoms occur.   Insulin Injection Instructions, Using Insulin Pens, Adult A subcutaneous injection is a shot of medicine that is injected into the layer of fat and tissue between skin and muscle. People with type 1 diabetes must take insulin because their bodies do not make it. People with type 2 diabetes may need to take insulin. There are many different types of insulin. The type of insulin that you take may determine how many injections you give yourself and when you need to give the injections. Supplies needed:  Soap and water to wash hands.  Your insulin pen.  A new, unused needle.  Alcohol wipes.  A disposal container that is meant for sharp items (sharps container), such as an empty plastic bottle with a cover. How to choose a site for injection The body absorbs insulin differently, depending on where the insulin is injected (injection site). It is best to inject insulin into the same body area each time (for example, always in the abdomen), but you should use a different spot in that area for each  injection. Do not inject the insulin in the same spot each time. There are five main areas that can be  used for injecting. These areas include:  Abdomen. This is the preferred area.  Front of thigh.  Upper, outer side of thigh.  Upper, outer side of arm.  Upper, outer part of buttock. How to use an insulin pen  First, follow the steps for Get ready, then continue with the steps for Inject the insulin. Get ready 1. Wash your hands with soap and water. If soap and water are not available, use hand sanitizer. 2. Before you give yourself an insulin injection, be sure to test your blood sugar level (blood glucose level) and write down that number. Follow any instructions from your health care provider about what to do if your blood glucose level is higher or lower than your normal range. 3. Check the expiration date and the type of insulin that is in the pen. 4. If you are using CLEAR insulin, check to see that it is clear and free of clumps. 5. If you are using CLOUDY insulin, do not shake the pen to get the injection ready. Instead, get it ready in one of these ways: ? Gently roll the pen between your palms several times. ? Tip the pen up and down several times. 6. Remove the cap from the insulin pen. 7. Use an alcohol wipe to clean the rubber tip of the pen. 8. Remove the protective paper tab from the disposable needle. Do not let the needle touch anything. 9. Screw a new, unused needle onto the pen. 10. Remove the outer plastic needle cover. Do not throw away the outer plastic cover yet. ? If the pen uses a special safety needle, leave the inner needle shield in place. ? If the pen does not use a special safety needle, remove the inner plastic cover from the needle. 11. Follow the manufacturer's instructions to prime the insulin pen with the volume of insulin needed. Hold the pen with the needle pointing up, and push the button on the opposite end of the pen until a drop of insulin  appears at the needle tip. If no insulin appears, repeat this step. 12. Turn the button (dial) to the number of units of insulin that you will be injecting. Inject the insulin 1. Use an alcohol wipe to clean the site where you will be injecting the needle. Let the site air-dry. 2. Hold the pen in the palm of your writing hand like a pencil. 3. If directed by your health care provider, use your other hand to pinch and hold about an inch (2.5 cm) of skin at the injection site. Do not directly touch the cleaned part of the skin. 4. Gently but quickly, use your writing hand to put the needle straight into the skin. The needle should be at a 90-degree angle (perpendicular) to the skin. 5. When the needle is completely inserted into the skin, use your thumb or index finger of your writing hand to push the top button of the pen down all the way to inject the insulin. 6. Let go of the skin that you are pinching. Continue to hold the pen in place with your writing hand. 7. Wait 10 seconds, then pull the needle straight out of the skin. This will allow all of the insulin to go from the pen and needle into your body. 8. Carefully put the larger (outer) plastic cover of the needle back over the needle, then unscrew the capped needle and discard it in a sharps container, such as an empty plastic bottle with a cover. 9. Put  the plastic cap back on the insulin pen. How to throw away supplies  Discard all used needles in a puncture-proof sharps disposal container. You can ask your local pharmacy about where you can get this kind of disposal container, or you can use an empty plastic liquid laundry detergent bottle that has a cover.  Follow the disposal regulations for the area where you live. Do not use any needle more than one time.  Throw away empty disposable pens in the regular trash. Questions to ask your health care provider  How often should I be taking insulin?  How often should I check my blood  glucose?  What amount of insulin should I be taking at each time?  What are the side effects?  What should I do if my blood glucose is too high?  What should I do if my blood glucose is too low?  What should I do if I forget to take my insulin?  What number should I call if I have questions? Where to find more information  American Diabetes Association (ADA): www.diabetes.org  American Association of Diabetes Educators (AADE) Patient Resources: https://www.diabeteseducator.org Summary  A subcutaneous injection is a shot of medicine that is injected into the layer of fat and tissue between skin and muscle.  Before you give yourself an insulin injection, be sure to test your blood sugar level (blood glucose level) and write down that number.  Check the expiration date and the type of insulin that is in the pen. The type of insulin that you take may determine how many injections you give yourself and when you need to give the injections.  It is best to inject insulin into the same body area each time (for example, always in the abdomen), but you should use a different spot in that area for each injection. This information is not intended to replace advice given to you by your health care provider. Make sure you discuss any questions you have with your health care provider. Document Released: 12/10/2015 Document Revised: 11/26/2017 Document Reviewed: 12/10/2015 Elsevier Interactive Patient Education  [employer].    This information is directly available on the CDC website: RunningShows.co.za.html    Source:CDC Reference to specific commercial products, manufacturers, companies, or trademarks does not constitute its endorsement or recommendation by the [place], [place], or Centers for [employer] and [employer].   If you have lab work done today you will be contacted with  your lab results within the next 2 weeks.  If you have not heard from [place] then please contact us. The fastest way to get your results is to register for My Chart.   IF you received an x-ray today, you will receive an invoice from [facility]. Please contact [facility] at [phone] with questions or concerns regarding your invoice.   IF you received labwork today, you will receive an invoice from [facility]. Please contact [facility] at [phone] with questions or concerns regarding your invoice.   Our billing staff will not be able to assist you with questions regarding bills from these companies.  You will be contacted with the lab results as soon as they are available. The fastest way to get your results is to activate your My Chart account. Instructions are located on the last page of this paperwork. If you have not heard from [place] regarding the results in 2 weeks, please contact this office.        I discussed the assessment and  treatment plan with the patient. The patient was provided an opportunity to ask questions and all were answered. The patient agreed with the plan and demonstrated an understanding of the instructions.   The patient was advised to call back or seek an in-person evaluation if the symptoms worsen or if the condition fails to improve as anticipated.  I provided 20 minutes of non-face-to-face time during this encounter.   Wendie Agreste, MD

## 2019-03-21 NOTE — Patient Instructions (Addendum)
Good talking to you today.  Glad to hear that we can get back on track with some of the medications.  I am also happy to hear that you are improving from the recent COVID-19 infection.  As we discussed if there is acute worsening including shortness of breath at rest, confusion, inability to drink fluids he may need to be seen in the emergency room.  If you are having worsening symptoms please let me now.  For diabetes, check your blood sugar fasting and one other time during the day 2 hours after a meal.  Will restart Lantus at 10 units once per day but if your sugars remain over 200 then increase by 2 units every 3 days until those blood sugars get below 200 and then remain at that dose.  Watch for low blood sugar symptoms with using insulin as we discussed and if those occur may need to back off the dose.  Make sure not to skip meals when using insulin.  See other information below and I will talk to you next week.  I restarted your blood pressure medication, but half the previous dose to make sure we do not put you on too much medicine.  We can discuss this further at follow-up visit.  Return to the clinic or go to the nearest emergency room if any of your symptoms worsen or new symptoms occur.   Insulin Injection Instructions, Using Insulin Pens, Adult A subcutaneous injection is a shot of medicine that is injected into the layer of fat and tissue between skin and muscle. People with type 1 diabetes must take insulin because their bodies do not make it. People with type 2 diabetes may need to take insulin. There are many different types of insulin. The type of insulin that you take may determine how many injections you give yourself and when you need to give the injections. Supplies needed:  Soap and water to wash hands.  Your insulin pen.  A new, unused needle.  Alcohol wipes.  A disposal container that is meant for sharp items (sharps container), such as an empty plastic bottle with a  cover. How to choose a site for injection The body absorbs insulin differently, depending on where the insulin is injected (injection site). It is best to inject insulin into the same body area each time (for example, always in the abdomen), but you should use a different spot in that area for each injection. Do not inject the insulin in the same spot each time. There are five main areas that can be used for injecting. These areas include:  Abdomen. This is the preferred area.  Front of thigh.  Upper, outer side of thigh.  Upper, outer side of arm.  Upper, outer part of buttock. How to use an insulin pen  First, follow the steps for Get ready, then continue with the steps for Inject the insulin. Get ready 1. Wash your hands with soap and water. If soap and water are not available, use hand sanitizer. 2. Before you give yourself an insulin injection, be sure to test your blood sugar level (blood glucose level) and write down that number. Follow any instructions from your health care provider about what to do if your blood glucose level is higher or lower than your normal range. 3. Check the expiration date and the type of insulin that is in the pen. 4. If you are using CLEAR insulin, check to see that it is clear and free of clumps. 5.  If you are using CLOUDY insulin, do not shake the pen to get the injection ready. Instead, get it ready in one of these ways: ? Gently roll the pen between your palms several times. ? Tip the pen up and down several times. 6. Remove the cap from the insulin pen. 7. Use an alcohol wipe to clean the rubber tip of the pen. 8. Remove the protective paper tab from the disposable needle. Do not let the needle touch anything. 9. Screw a new, unused needle onto the pen. 10. Remove the outer plastic needle cover. Do not throw away the outer plastic cover yet. ? If the pen uses a special safety needle, leave the inner needle shield in place. ? If the pen does not  use a special safety needle, remove the inner plastic cover from the needle. 11. Follow the manufacturer's instructions to prime the insulin pen with the volume of insulin needed. Hold the pen with the needle pointing up, and push the button on the opposite end of the pen until a drop of insulin appears at the needle tip. If no insulin appears, repeat this step. 12. Turn the button (dial) to the number of units of insulin that you will be injecting. Inject the insulin 1. Use an alcohol wipe to clean the site where you will be injecting the needle. Let the site air-dry. 2. Hold the pen in the palm of your writing hand like a pencil. 3. If directed by your health care provider, use your other hand to pinch and hold about an inch (2.5 cm) of skin at the injection site. Do not directly touch the cleaned part of the skin. 4. Gently but quickly, use your writing hand to put the needle straight into the skin. The needle should be at a 90-degree angle (perpendicular) to the skin. 5. When the needle is completely inserted into the skin, use your thumb or index finger of your writing hand to push the top button of the pen down all the way to inject the insulin. 6. Let go of the skin that you are pinching. Continue to hold the pen in place with your writing hand. 7. Wait 10 seconds, then pull the needle straight out of the skin. This will allow all of the insulin to go from the pen and needle into your body. 8. Carefully put the larger (outer) plastic cover of the needle back over the needle, then unscrew the capped needle and discard it in a sharps container, such as an empty plastic bottle with a cover. 9. Put the plastic cap back on the insulin pen. How to throw away supplies  Discard all used needles in a puncture-proof sharps disposal container. You can ask your local pharmacy about where you can get this kind of disposal container, or you can use an empty plastic liquid laundry detergent bottle that has a  cover.  Follow the disposal regulations for the area where you live. Do not use any needle more than one time.  Throw away empty disposable pens in the regular trash. Questions to ask your health care provider  How often should I be taking insulin?  How often should I check my blood glucose?  What amount of insulin should I be taking at each time?  What are the side effects?  What should I do if my blood glucose is too high?  What should I do if my blood glucose is too low?  What should I do if I forget to  take my insulin?  What number should I call if I have questions? Where to find more information  American Diabetes Association (ADA): www.diabetes.org  American Association of Diabetes Educators (AADE) Patient Resources: https://www.diabeteseducator.org Summary  A subcutaneous injection is a shot of medicine that is injected into the layer of fat and tissue between skin and muscle.  Before you give yourself an insulin injection, be sure to test your blood sugar level (blood glucose level) and write down that number.  Check the expiration date and the type of insulin that is in the pen. The type of insulin that you take may determine how many injections you give yourself and when you need to give the injections.  It is best to inject insulin into the same body area each time (for example, always in the abdomen), but you should use a different spot in that area for each injection. This information is not intended to replace advice given to you by your health care provider. Make sure you discuss any questions you have with your health care provider. Document Released: 12/10/2015 Document Revised: 11/26/2017 Document Reviewed: 12/10/2015 Elsevier Interactive Patient Education  [employer].    This information is directly available on the CDC website: RunningShows.co.za.html    Source:CDC Reference to specific  commercial products, manufacturers, companies, or trademarks does not constitute its endorsement or recommendation by the [place], [place], or Centers for [employer] and [employer].   If you have lab work done today you will be contacted with your lab results within the next 2 weeks.  If you have not heard from [place] then please contact us. The fastest way to get your results is to register for My Chart.   IF you received an x-ray today, you will receive an invoice from [facility]. Please contact [facility] at [phone] with questions or concerns regarding your invoice.   IF you received labwork today, you will receive an invoice from [facility]. Please contact [facility] at [phone] with questions or concerns regarding your invoice.   Our billing staff will not be able to assist you with questions regarding bills from these companies.  You will be contacted with the lab results as soon as they are available. The fastest way to get your results is to activate your My Chart account. Instructions are located on the last page of this paperwork. If you have not heard from [place] regarding the results in 2 weeks, please contact this office.

## 2019-03-24 ENCOUNTER — Telehealth: Payer: Self-pay | Admitting: Family Medicine

## 2019-03-24 NOTE — Telephone Encounter (Addendum)
03/24/2019 - PATIENT HAD A TELEMED VISIT WITH DR. Carlota Raspberry ON Friday 03/21/2019 FOR DIABETES AND COVID 19. ON Monday 0/07/7352 FELICIA K. GAVE ME A FOLLOW-UP APPOINTMENT TO MAKE FOR THE PATIENT ON Friday 03/28/2019. I TRIED TO CALL HIM TO SCHEDULE BUT HAD TO LEAVE HIM A VOICE MAIL TO RETURN OUR CALL. [place]

## 2019-04-17 ENCOUNTER — Telehealth: Payer: Self-pay | Admitting: *Deleted

## 2019-04-17 NOTE — Telephone Encounter (Signed)
I called and left a voicemail for him to call us back regarding donating plasma.   I also left the name of the [email or web address] web site on his voicemail.   I gave him the number [phone].

## 2019-10-06 ENCOUNTER — Ambulatory Visit (HOSPITAL_COMMUNITY)
Admission: EM | Admit: 2019-10-06 | Discharge: 2019-10-06 | Disposition: A | Discharge status: Home / Self Care | Payer: 59 | Attending: Internal Medicine | Admitting: Internal Medicine

## 2019-10-06 ENCOUNTER — Other Ambulatory Visit: Payer: Self-pay

## 2019-10-06 ENCOUNTER — Encounter (HOSPITAL_COMMUNITY): Payer: Self-pay

## 2019-10-06 DIAGNOSIS — L02211 Cutaneous abscess of abdominal wall: Secondary | ICD-10-CM | POA: Diagnosis not present

## 2019-10-06 MED ORDER — DOXYCYCLINE HYCLATE 100 MG PO CAPS: 100.0000 mg | ORAL_CAPSULE | Freq: Two times a day (BID) | ORAL | 0 refills | Status: AC

## 2019-10-06 MED ORDER — IBUPROFEN 600 MG PO TABS: 600.0000 mg | ORAL_TABLET | Freq: Four times a day (QID) | ORAL | 0 refills | Status: DC | PRN

## 2019-10-06 MED FILL — DOXYCYCLINE HYC 100 MG CAPS: 100 | 10 days supply | Qty: 20 | Fill #0

## 2019-10-06 MED FILL — IBUPROFEN 600 MG TABLET: 600 | 8 days supply | Qty: 30 | Fill #0

## 2019-10-06 NOTE — ED Triage Notes (Signed)
Patient presents to Urgent Care with complaints of abscess on right chest since 4 days ago. Patient reports at first it looked like a pimple, then opened the next day. Area appears red and scabbed over, pt reports tenderness around it.

## 2019-10-06 NOTE — ED Provider Notes (Signed)
[place]    CSN: [id number] Arrival date & time: 10/06/19  1031      History   Chief Complaint Chief Complaint  Patient presents with  . Abscess    HPI Ian Hughes is a 38 y.o. male history of hypertension, DM, presenting today for evaluation of an abscess.  Patient states approximately 4 days ago he had a lesion to his right upper abdomen that appeared similar to a pimple.  The following day began to drain.  Since he has had progressively increased redness swelling and tenderness to this area.  Denies history of similar.  He has tried cleaning with peroxide.  Denies any fevers.  Denies chest pain or shortness of breath.  HPI  Past Medical History:  Diagnosis Date  . Cataract 2011   surgery on R eye  . Diabetes mellitus without complication ([place])   . High cholesterol   . Obesity     Patient Active Problem List   Diagnosis Date Noted  . UARS (upper airway resistance syndrome) 04/07/2015  . Severe obesity (BMI >= 40) ([place]) 04/07/2015  . Primary snoring 04/07/2015  . Snoring 12/21/2014  . Drowsiness 12/21/2014  . Witnessed apneic spells 12/21/2014  . ED (erectile dysfunction) 09/24/2012  . Diabetes mellitus type 2 in obese ([place]) 07/04/2009    Class: Chronic  . OBESITY, NOS 01/17/2007  . BELLS PALSY 01/17/2007  . HYPERTENSION, BENIGN SYSTEMIC 01/17/2007    Past Surgical History:  Procedure Laterality Date  . EYE SURGERY Left 2011   cataract  . EYE SURGERY Right 2012       Home Medications    Prior to Admission medications   Medication Sig Start Date End Date Taking? Authorizing Provider  doxycycline (VIBRAMYCIN) 100 MG capsule Take 1 capsule (100 mg total) by mouth 2 (two) times daily for 10 days. 10/06/19 10/16/19  Wieters, Hallie C, PA-C  ibuprofen (ADVIL) 600 MG tablet Take 1 tablet (600 mg total) by mouth every 6 (six) hours as needed. 10/06/19   Wieters, Hallie C, PA-C  Insulin Glargine (LANTUS SOLOSTAR) 100 UNIT/ML Solostar Pen Inject  10 Units into the skin every morning. And pen needles 1/day 03/21/19   Wendie Agreste, MD  lisinopril (ZESTRIL) 5 MG tablet Take 1 tablet (5 mg total) by mouth daily. 03/21/19   Wendie Agreste, MD  atorvastatin (LIPITOR) 10 MG tablet Take 1 tablet (10 mg total) by mouth daily. Patient not taking: Reported on 03/21/2019 01/12/17 10/06/19  Jaynee Eagles, PA-C  bromocriptine (PARLODEL) 2.5 MG tablet 1/4 tab daily Patient not taking: Reported on 03/12/2019 01/11/18 10/06/19  Renato Shin, MD  glimepiride (AMARYL) 1 MG tablet Take 0.5 tablets (0.5 mg total) by mouth daily with breakfast. Patient not taking: Reported on 03/12/2019 01/11/18 10/06/19  Renato Shin, MD  sitaGLIPtin (JANUVIA) 100 MG tablet TAKE 1 TABLET (100 MG TOTAL) BY MOUTH DAILY. Patient not taking: Reported on 03/12/2019 01/11/18 10/06/19  Renato Shin, MD    Family History Family History  Problem Relation Age of Onset  . Diabetes Mother   . Cancer Father        leukemia   . Obesity Other     Social History Social History   Tobacco Use  . Smoking status: Never Smoker  . Smokeless tobacco: Never Used  Substance Use Topics  . Alcohol use: Not Currently    Alcohol/week: 0.0 standard drinks    Comment: 1 or 2 every 3 months   . Drug use: No  Allergies   Patient has no known allergies.   Review of Systems Review of Systems  Constitutional: Negative for fatigue and fever.  Eyes: Negative for redness, itching and visual disturbance.  Respiratory: Negative for shortness of breath.   Cardiovascular: Negative for chest pain and leg swelling.  Gastrointestinal: Negative for nausea and vomiting.  Musculoskeletal: Negative for arthralgias and myalgias.  Skin: Positive for color change and wound. Negative for rash.  Neurological: Negative for dizziness, syncope, weakness, light-headedness and headaches.     Physical Exam Triage Vital Signs ED Triage Vitals  Enc Vitals Group     BP 10/06/19 1112 137/86     Pulse  Rate 10/06/19 1112 95     Resp 10/06/19 1112 16     Temp 10/06/19 1112 97.8 F (36.6 C)     Temp Source 10/06/19 1112 Temporal     SpO2 10/06/19 1112 100 %     Weight --      Height --      Head Circumference --      Peak Flow --      Pain Score 10/06/19 1109 6     Pain Loc --      Pain Edu? --      Excl. in [place]? --    No data found.  Updated Vital Signs BP 137/86 (BP Location: Right Arm)   Pulse 95   Temp 97.8 F (36.6 C) (Temporal)   Resp 16   SpO2 100%   Visual Acuity Right Eye Distance:   Left Eye Distance:   Bilateral Distance:    Right Eye Near:   Left Eye Near:    Bilateral Near:     Physical Exam Vitals signs and nursing note reviewed.  Constitutional:      Appearance: He is well-developed.     Comments: No acute distress  HENT:     Head: Normocephalic and atraumatic.     Nose: Nose normal.  Eyes:     Conjunctiva/sclera: Conjunctivae normal.  Neck:     Musculoskeletal: Neck supple.  Cardiovascular:     Rate and Rhythm: Normal rate.  Pulmonary:     Effort: Pulmonary effort is normal. No respiratory distress.  Abdominal:     General: There is no distension.  Musculoskeletal: Normal range of motion.  Skin:    General: Skin is warm and dry.     Comments: Right upper abdomen with 2.5 cm x 1.5 cm area of erythema and induration with central scabbing.  No significant fluctuance palpated.  Neurological:     Mental Status: He is alert and oriented to person, place, and time.      UC Treatments / Results  Labs (all labs ordered are listed, but only abnormal results are displayed) Labs Reviewed - No data to display  EKG   Radiology No results found.  Procedures Incision and Drainage  Date/Time: 10/06/2019 11:48 AM Performed by: Wieters, Elesa Hacker, PA-C Authorized by: Chase Picket, MD   Consent:    Consent obtained:  Verbal   Consent given by:  Patient   Risks discussed:  Bleeding, incomplete drainage and pain   Alternatives  discussed:  Alternative treatment Location:    Type:  Abscess   Size:  2.5   Location:  Trunk   Trunk location:  Abdomen Pre-procedure details:    Skin preparation:  Betadine Anesthesia (see MAR for exact dosages):    Anesthesia method:  Local infiltration   Local anesthetic:  Lidocaine 2% WITH epi Procedure  type:    Complexity:  Simple Procedure details:    Needle aspiration: no     Incision types:  Single straight   Incision depth:  Subcutaneous   Scalpel blade:  11   Wound management:  Probed and deloculated   Drainage:  Bloody   Drainage amount:  Scant   Wound treatment:  Wound left open Post-procedure details:    Patient tolerance of procedure:  Tolerated well, no immediate complications   (including critical care time)  Medications Ordered in UC Medications - No data to display  Initial Impression / Assessment and Plan / UC Course  I have reviewed the triage vital signs and the nursing notes.  Pertinent labs & imaging results that were available during my care of the patient were reviewed by me and considered in my medical decision making (see chart for details).     I&D attempted on abscess, and no significant pus obtained.  Avoided deep I&D given location.  Initiated on doxycycline, warm compresses, continue to monitor,Discussed strict return precautions. Patient verbalized understanding and is agreeable with plan.  Final Clinical Impressions(s) / UC Diagnoses   Final diagnoses:  Abscess of abdominal wall     Discharge Instructions     Please begin doxycycline for 10 days  Apply warm compresses/hot rags to area with massage to express further drainage especially the first 24-48 hours  Return if symptoms returning or not improving   ED Prescriptions    Medication Sig Dispense Auth. Provider   doxycycline (VIBRAMYCIN) 100 MG capsule Take 1 capsule (100 mg total) by mouth 2 (two) times daily for 10 days. 20 capsule Wieters, Hallie C, PA-C   ibuprofen  (ADVIL) 600 MG tablet Take 1 tablet (600 mg total) by mouth every 6 (six) hours as needed. 30 tablet Wieters, Jacobus C, PA-C     PDMP not reviewed this encounter.   Janith Lima, PA-C 10/06/19 1149

## 2019-10-06 NOTE — Discharge Instructions (Signed)
Please begin doxycycline for 10 days ° °Apply warm compresses/hot rags to area with massage to express further drainage especially the first 24-48 hours ° °Return if symptoms returning or not improving  °

## 2019-12-08 ENCOUNTER — Encounter (HOSPITAL_COMMUNITY): Payer: Self-pay

## 2019-12-08 ENCOUNTER — Other Ambulatory Visit: Payer: Self-pay

## 2019-12-08 ENCOUNTER — Ambulatory Visit (HOSPITAL_COMMUNITY)
Admission: EM | Admit: 2019-12-08 | Discharge: 2019-12-08 | Disposition: A | Discharge status: Home / Self Care | Payer: Self-pay | Attending: Family Medicine | Admitting: Family Medicine

## 2019-12-08 DIAGNOSIS — H9191 Unspecified hearing loss, right ear: Secondary | ICD-10-CM

## 2019-12-08 DIAGNOSIS — H6591 Unspecified nonsuppurative otitis media, right ear: Secondary | ICD-10-CM

## 2019-12-08 MED ORDER — FLUTICASONE PROPIONATE 50 MCG/ACT NA SUSP: 1.0000 | Freq: Every day | NASAL | 2 refills | Status: DC

## 2019-12-08 MED ORDER — AMOXICILLIN-POT CLAVULANATE 875-125 MG PO TABS: 1.0000 | ORAL_TABLET | Freq: Two times a day (BID) | ORAL | 0 refills | Status: AC

## 2019-12-08 NOTE — ED Triage Notes (Signed)
Pt presents with right ear fullness and loss of hearing in that ear since waking up this morning.

## 2019-12-08 NOTE — ED Provider Notes (Signed)
[place]    CSN: [id number] Arrival date & time: 12/08/19  1113      History   Chief Complaint Chief Complaint  Patient presents with  . Ear Fullness    Right    HPI Ian Hughes is a 39 y.o. male.   Ian Hughes presents with complaints of decreased hearing to right ear which he woke with this morning. Maybe a low pitched ringing type sound? No noticeable pain per se. No fevers. No other URI symptoms. Denies any previous similar. Left ear feels well. History of obesity, dm.     ROS per HPI, negative if not otherwise mentioned.      Past Medical History:  Diagnosis Date  . Cataract 2011   surgery on R eye  . Diabetes mellitus without complication ([place])   . High cholesterol   . Obesity     Patient Active Problem List   Diagnosis Date Noted  . UARS (upper airway resistance syndrome) 04/07/2015  . Severe obesity (BMI >= 40) ([place]) 04/07/2015  . Primary snoring 04/07/2015  . Snoring 12/21/2014  . Drowsiness 12/21/2014  . Witnessed apneic spells 12/21/2014  . ED (erectile dysfunction) 09/24/2012  . Diabetes mellitus type 2 in obese ([place]) 07/04/2009    Class: Chronic  . OBESITY, NOS 01/17/2007  . BELLS PALSY 01/17/2007  . HYPERTENSION, BENIGN SYSTEMIC 01/17/2007    Past Surgical History:  Procedure Laterality Date  . EYE SURGERY Left 2011   cataract  . EYE SURGERY Right 2012       Home Medications    Prior to Admission medications   Medication Sig Start Date End Date Taking? Authorizing Provider  amoxicillin-clavulanate (AUGMENTIN) 875-125 MG tablet Take 1 tablet by mouth every 12 (twelve) hours for 5 days. 12/08/19 12/13/19  Zigmund Gottron, NP  fluticasone (FLONASE) 50 MCG/ACT nasal spray Place 1 spray into both nostrils daily. 12/08/19   Zigmund Gottron, NP  ibuprofen (ADVIL) 600 MG tablet Take 1 tablet (600 mg total) by mouth every 6 (six) hours as needed. 10/06/19   Wieters, Hallie C, PA-C  Insulin Glargine (LANTUS SOLOSTAR) 100  UNIT/ML Solostar Pen Inject 10 Units into the skin every morning. And pen needles 1/day 03/21/19   Wendie Agreste, MD  lisinopril (ZESTRIL) 5 MG tablet Take 1 tablet (5 mg total) by mouth daily. 03/21/19   Wendie Agreste, MD  atorvastatin (LIPITOR) 10 MG tablet Take 1 tablet (10 mg total) by mouth daily. Patient not taking: Reported on 03/21/2019 01/12/17 10/06/19  Jaynee Eagles, PA-C  bromocriptine (PARLODEL) 2.5 MG tablet 1/4 tab daily Patient not taking: Reported on 03/12/2019 01/11/18 10/06/19  Renato Shin, MD  glimepiride (AMARYL) 1 MG tablet Take 0.5 tablets (0.5 mg total) by mouth daily with breakfast. Patient not taking: Reported on 03/12/2019 01/11/18 10/06/19  Renato Shin, MD  sitaGLIPtin (JANUVIA) 100 MG tablet TAKE 1 TABLET (100 MG TOTAL) BY MOUTH DAILY. Patient not taking: Reported on 03/12/2019 01/11/18 10/06/19  Renato Shin, MD    Family History Family History  Problem Relation Age of Onset  . Diabetes Mother   . Cancer Father        leukemia   . Obesity Other     Social History Social History   Tobacco Use  . Smoking status: Never Smoker  . Smokeless tobacco: Never Used  Substance Use Topics  . Alcohol use: Not Currently    Alcohol/week: 0.0 standard drinks    Comment: 1 or 2 every 3  months   . Drug use: No     Allergies   Patient has no known allergies.   Review of Systems Review of Systems   Physical Exam Triage Vital Signs ED Triage Vitals  Enc Vitals Group     BP 12/08/19 1213 (!) 117/95     Pulse Rate 12/08/19 1213 87     Resp 12/08/19 1213 18     Temp 12/08/19 1213 98.6 F (37 C)     Temp Source 12/08/19 1213 Oral     SpO2 12/08/19 1213 98 %     Weight --      Height --      Head Circumference --      Peak Flow --      Pain Score 12/08/19 1215 1     Pain Loc --      Pain Edu? --      Excl. in [place]? --    No data found.  Updated Vital Signs BP (!) 117/95 (BP Location: Right Arm)   Pulse 87   Temp 98.6 F (37 C) (Oral)   Resp 18    SpO2 98%    Physical Exam Constitutional:      Appearance: He is well-developed.  HENT:     Right Ear: Decreased hearing noted. No drainage, swelling or tenderness. A middle ear effusion is present. Tympanic membrane is not perforated or bulging.     Left Ear: No drainage, swelling or tenderness. A middle ear effusion is present.     Ears:     Comments: Bilateral ears with white effusion noted, mildly red to canal without drainage or swelling noted; non tender  Cardiovascular:     Rate and Rhythm: Normal rate.  Pulmonary:     Effort: Pulmonary effort is normal.  Skin:    General: Skin is warm and dry.  Neurological:     Mental Status: He is alert and oriented to person, place, and time.      UC Treatments / Results  Labs (all labs ordered are listed, but only abnormal results are displayed) Labs Reviewed - No data to display  EKG   Radiology No results found.  Procedures Procedures (including critical care time)  Medications Ordered in UC Medications - No data to display  Initial Impression / Assessment and Plan / UC Course  I have reviewed the triage vital signs and the nursing notes.  Pertinent labs & imaging results that were available during my care of the patient were reviewed by me and considered in my medical decision making (see chart for details).     Effusions bilaterally but with decreased hearing as well to right ear this morning. Opted to cover with antibiotics and start nasal spray. Encouraged PCP follow up if persistent may need to see ENT. Patient verbalized understanding and agreeable to plan.  . Final Clinical Impressions(s) / UC Diagnoses   Final diagnoses:  Fluid level behind tympanic membrane of right ear  Decreased hearing of right ear     Discharge Instructions     You do have some redness and fluid to your ear drum which I feel is contributing to your symptoms.  We will try 5 days of antibiotics to try to clear this.  Please also  start a daily nasal spray such as flonase or nasacort.  Please follow up with your primary care provider if no improvement, or return to be seen if worsening.    ED Prescriptions    Medication Sig Dispense  Auth. Provider   amoxicillin-clavulanate (AUGMENTIN) 875-125 MG tablet Take 1 tablet by mouth every 12 (twelve) hours for 5 days. 10 tablet Augusto Gamble B, NP   fluticasone (FLONASE) 50 MCG/ACT nasal spray Place 1 spray into both nostrils daily. 16 g Zigmund Gottron, NP     PDMP not reviewed this encounter.   Zigmund Gottron, NP 12/08/19 1249

## 2019-12-08 NOTE — Discharge Instructions (Signed)
You do have some redness and fluid to your ear drum which I feel is contributing to your symptoms.  We will try 5 days of antibiotics to try to clear this.  Please also start a daily nasal spray such as flonase or nasacort.  Please follow up with your primary care provider if no improvement, or return to be seen if worsening.

## 2019-12-12 ENCOUNTER — Telehealth: Payer: Self-pay | Admitting: *Deleted

## 2019-12-12 ENCOUNTER — Encounter: Payer: Self-pay | Admitting: Family Medicine

## 2019-12-12 ENCOUNTER — Telehealth: Payer: Self-pay | Admitting: Family Medicine

## 2019-12-12 ENCOUNTER — Ambulatory Visit (INDEPENDENT_AMBULATORY_CARE_PROVIDER_SITE_OTHER): Payer: BC Managed Care – PPO | Admitting: Family Medicine

## 2019-12-12 ENCOUNTER — Other Ambulatory Visit: Payer: Self-pay

## 2019-12-12 VITALS — BP 147/80 | HR 100 | Temp 98.2°F | Resp 16 | Ht 70.0 in | Wt 253.0 lb

## 2019-12-12 DIAGNOSIS — Z9114 Patient's other noncompliance with medication regimen: Secondary | ICD-10-CM | POA: Diagnosis not present

## 2019-12-12 DIAGNOSIS — E669 Obesity, unspecified: Secondary | ICD-10-CM

## 2019-12-12 DIAGNOSIS — H9192 Unspecified hearing loss, left ear: Secondary | ICD-10-CM | POA: Diagnosis not present

## 2019-12-12 DIAGNOSIS — I10 Essential (primary) hypertension: Secondary | ICD-10-CM | POA: Diagnosis not present

## 2019-12-12 DIAGNOSIS — E1136 Type 2 diabetes mellitus with diabetic cataract: Secondary | ICD-10-CM

## 2019-12-12 DIAGNOSIS — Z9189 Other specified personal risk factors, not elsewhere classified: Secondary | ICD-10-CM | POA: Diagnosis not present

## 2019-12-12 DIAGNOSIS — IMO0002 Reserved for concepts with insufficient information to code with codable children: Secondary | ICD-10-CM

## 2019-12-12 DIAGNOSIS — E1165 Type 2 diabetes mellitus with hyperglycemia: Secondary | ICD-10-CM

## 2019-12-12 LAB — POCT GLYCOSYLATED HEMOGLOBIN (HGB A1C): Hemoglobin A1C: 14 % — AB (ref 4.0–5.6)

## 2019-12-12 LAB — GLUCOSE, POCT (MANUAL RESULT ENTRY)

## 2019-12-12 MED ORDER — LANTUS SOLOSTAR 100 UNIT/ML ~~LOC~~ SOPN: 10.0000 [IU] | PEN_INJECTOR | SUBCUTANEOUS | 99 refills | Status: DC

## 2019-12-12 MED ORDER — LISINOPRIL 5 MG PO TABS: 5.0000 mg | ORAL_TABLET | Freq: Every day | ORAL | 3 refills | Status: DC

## 2019-12-12 MED ORDER — ATORVASTATIN CALCIUM 10 MG PO TABS: 10.0000 mg | ORAL_TABLET | Freq: Every day | ORAL | 3 refills | Status: DC

## 2019-12-12 NOTE — Patient Instructions (Signed)
Restart lantus 10 units per day then increase by 2 units ever 3 days until readings below 200. Once at that level - stay at same dose of insulin until follow up in 2 weeks.  Return to the clinic or go to the nearest emergency room if any of your symptoms worsen or new symptoms occur.   I will have you evaluated by ear nose and throat specialist for your ear. continue flonase for now.    Type 2 Diabetes Mellitus, Self Care, Adult Caring for yourself after you have been diagnosed with type 2 diabetes (type 2 diabetes mellitus) means keeping your blood sugar (glucose) under control with a balance of:  Nutrition.  Exercise.  Lifestyle changes.  Medicines or insulin, if necessary.  Support from your team of health care providers and others. The following information explains what you need to know to manage your diabetes at home. What are the risks? Having diabetes can put you at risk for other long-term (chronic) conditions, such as heart disease and kidney disease. Your health care provider may prescribe medicines to help prevent complications from diabetes. These medicines may include:  Aspirin.  Medicine to lower cholesterol.  Medicine to control blood pressure. How to monitor blood glucose   Check your blood glucose every day, as often as told by your health care provider.  Have your A1c (hemoglobin A1c) level checked two or more times a year, or as often as told by your health care provider. Your health care provider will set individualized treatment goals for you. Generally, the goal of treatment is to maintain the following blood glucose levels:  Before meals (preprandial): 80-130 mg/dL (4.4-7.2 mmol/L).  After meals (postprandial): below 180 mg/dL (10 mmol/L).  A1c level: less than 7%. How to manage hyperglycemia and hypoglycemia Hyperglycemia symptoms Hyperglycemia, also called high blood glucose, occurs when blood glucose is too high. Make sure you know the early signs  of hyperglycemia, such as:  Increased thirst.  Hunger.  Feeling very tired.  Needing to urinate more often than usual.  Blurry vision. Hypoglycemia symptoms Hypoglycemia, also called low blood glucose, occurswith a blood glucose level at or below 70 mg/dL (3.9 mmol/L). The risk for hypoglycemia increases during or after exercise, during sleep, during illness, and when skipping meals or not eating for a long time (fasting). It is important to know the symptoms of hypoglycemia and treat it right away. Always have a 15-gram rapid-acting carbohydrate snack with you to treat low blood glucose. Family members and close friends should also know the symptoms and should understand how to treat hypoglycemia, in case you are not able to treat yourself. Symptoms may include:  Hunger.  Anxiety.  Sweating and feeling clammy.  Confusion.  Dizziness or feeling light-headed.  Sleepiness.  Nausea.  Increased heart rate.  Headache.  Blurry vision.  Irritability.  A change in coordination.  Tingling or numbness around the mouth, lips, or tongue.  Restless sleep.  Fainting.  Seizure. Treating hypoglycemia If you are alert and able to swallow safely, follow the 15:15 rule:  Take 15 grams of a rapid-acting carbohydrate. Talk with your health care provider about how much you should take.  Rapid-acting options include: ? Glucose pills (take 15 grams). ? 6-8 pieces of hard candy. ? 4-6 oz (120-150 mL) of fruit juice. ? 4-6 oz (120-150 mL) of regular (not diet) soda. ? 1 Tbsp (15 mL) honey or sugar.  Check your blood glucose 15 minutes after you take the carbohydrate.  If the  repeat blood glucose level is still at or below 70 mg/dL (3.9 mmol/L), take 15 grams of a carbohydrate again.  If your blood glucose level does not increase above 70 mg/dL (3.9 mmol/L) after 3 tries, seek emergency medical care.  After your blood glucose level returns to normal, eat a meal or a snack within  1 hour. Treating severe hypoglycemia Severe hypoglycemia is when your blood glucose level is at or below 54 mg/dL (3 mmol/L). Severe hypoglycemia is an emergency. Do not wait to see if the symptoms will go away. Get medical help right away. Call your local emergency services (911 in the [place] If you have severe hypoglycemia and you cannot eat or drink, you may need an injection of glucagon. A family member or close friend should learn how to check your blood glucose and how to give you a glucagon injection. Ask your health care provider if you need to have an emergency glucagon injection kit available. Severe hypoglycemia may need to be treated in a hospital. The treatment may include getting glucose through an IV. You may also need treatment for the cause of your hypoglycemia. Follow these instructions at home: Take diabetes medicines as told  If your health care provider prescribed insulin or diabetes medicines, take them every day.  Do not run out of insulin or other diabetes medicines that you take. Plan ahead so you always have these available.  If you use insulin, adjust your dosage based on how physically active you are and what foods you eat. Your health care provider will tell you how to adjust your dosage. Make healthy food choices  The things that you eat and drink affect your blood glucose and your insulin dosage. Making good choices helps to control your diabetes and prevent other health problems. A healthy meal plan includes eating lean proteins, complex carbohydrates, fresh fruits and vegetables, low-fat dairy products, and healthy fats. Make an appointment to see a diet and nutrition specialist (registered dietitian) to help you create an eating plan that is right for you. Make sure that you:  Follow instructions from your health care provider about eating or drinking restrictions.  Drink enough fluid to keep your urine pale yellow.  Keep a record of the carbohydrates that you  eat. Do this by reading food labels and learning the standard serving sizes of foods.  Follow your sick day plan whenever you cannot eat or drink as usual. Make this plan in advance with your health care provider.  Stay active Exercise regularly, as told by your health care provider. This may include:  Stretching and doing strength exercises, such as yoga or weightlifting, 2 or more times a week.  Doing 150 minutes or more of moderate-intensity or vigorous-intensity exercise each week. This could be brisk walking, biking, or water aerobics. ? Spread out your activity over 3 or more days of the week. ? Do not go more than 2 days in a row without doing some kind of physical activity. When you start a new exercise or activity, work with your health care provider to adjust your insulin, medicines, or food intake as needed. Make healthy lifestyle choices  Do not use any tobacco products, such as cigarettes, chewing tobacco, and e-cigarettes. If you need help quitting, ask your health care provider.  If your health care provider says that alcohol is safe for you, limit alcohol intake to no more than 1 drink per day for nonpregnant women and 2 drinks per day for men. One drink  equals 12 oz of beer (355 mL), 5 oz of wine (148 mL), or 1 oz of hard liquor (44 mL).  Learn to manage stress. If you need help with this, ask your health care provider. Care for your body   Keep your immunizations up to date. In addition to getting vaccinations as told by your health care provider, it is recommended that you get vaccinated against the following illnesses: ? The flu (influenza). Get a flu shot every year. ? Pneumonia. ? Hepatitis B.  Schedule an eye exam soon after your diagnosis, and then one time every year after that.  Check your skin and feet every day for cuts, bruises, redness, blisters, or sores. Schedule a foot exam with your health care provider once every year.  Brush your teeth and gums two  times a day, and floss one or more times a day. Visit your dentist one or more times every 6 months.  Maintain a healthy weight. General instructions  Take over-the-counter and prescription medicines only as told by your health care provider.  Share your diabetes management plan with people in your workplace, school, and household.  Carry a medical alert card or wear medical alert jewelry.  Keep all follow-up visits as told by your health care provider. This is important. Questions to ask your health care provider  Do I need to meet with a diabetes educator?  Where can I find a support group for people with diabetes? Where to find more information For more information about diabetes, visit:  American Diabetes Association (ADA): www.diabetes.org  American Association of Diabetes Educators (AADE): www.diabeteseducator.org Summary  Caring for yourself after you have been diagnosed with (type 2 diabetes mellitus) means keeping your blood sugar (glucose) under control with a balance of nutrition, exercise, lifestyle changes, and medicine.  Check your blood glucose every day, as often as told by your health care provider.  Having diabetes can put you at risk for other long-term (chronic) conditions, such as heart disease and kidney disease. Your health care provider may prescribe medicines to help prevent complications from diabetes.  Keep all follow-up visits as told by your health care provider. This is important. This information is not intended to replace advice given to you by your health care provider. Make sure you discuss any questions you have with your health care provider. Document Revised: 04/29/2018 Document Reviewed: 12/10/2015 Elsevier Patient Education  2020 [employer].

## 2019-12-12 NOTE — Telephone Encounter (Signed)
12/12/2019 - PATIENT SAW DR. Carlota Raspberry IN THE OFFICE ON Friday (12/12/2019) FOR HIS EAR. DR. Carlota Raspberry HAS REQUESTED HE RETURN FOR A FOLLOW-UP APPOINTMENT IN 2 WEEKS TO CHECK HIS DIABETES AND MEDICATION REVIEW. I DID NOT SEE PATIENT COME TO CHECK-OUT. I TRIED TO CALL AND SCHEDULE BUT HAD TO LEAVE HIM A VOICE MAIL TO RETURN MY CALL. [place]

## 2019-12-12 NOTE — Telephone Encounter (Signed)
Called patient again to let him know an appt has been schedule for Monday  12/15/2019 at 1:50 pm with Dr Benjamine Mola. Please arrive at least 15 minutes early to complete any paper work. Dr Carlota Raspberry has been advised and will contact you later.

## 2019-12-12 NOTE — Telephone Encounter (Addendum)
Called patient's mobile number [phone], left message in voice mail. The ENT specialist Dr Radene Journey has an appointment next Friday 12/19/2019. I advised Dr Carlota Raspberry and he stated he will contact you later today and continued medication as discussed.

## 2019-12-12 NOTE — Progress Notes (Signed)
Subjective:  Patient ID: Ian Hughes, male    DOB: 1981/09/11  Age: 39 y.o. MRN: [id number]  CC:  Chief Complaint  Patient presents with  . hearing loss    RIGHT seen at ED Urgent Care on 12/08/2019    HPI Ian Hughes presents for   R hearing loss: Evaluated at [facility] urgent care 4 days ago.  Symptoms noted that morning with decreased hearing in the right ear.  Low pitched ringing sound.  No pain.  Middle ear effusion was noted, mild red canal without drainage or swelling, nontender.  Effusions were actually noted bilaterally, started Flonase, Augmentin.  Taking augmentin and flonase as prescribed (2 sprays qd).  No pain, just decreased hearing.  No discharge, no fever. Left side feels fine.  No recent air travel, altitude travel, no recent illness.    Diabetes: Complicated by hyperglycemia, med nonadherence.  Last discussed in May 2020, plan for close follow-up at that time.  He has been previously treated by Dr. Loanne Drilling for diabetes, no as needed visit 9 days prior glucose 304.  Head not had recent endocrinology follow-up.  Was not taking any medications at that may first visit and was not wanting to restart medications.  Ultimately restarted Lantus 10 units/day with increasing by 2 units every 2 to 3 days until readings below 200 and plan 1 week follow-up.  Unfortunately have not seen him since last May. Did not have lab visit.   No recent meds, no follow up with endocrinology or other provider. Out of work - out [employer], stopped insulin in September. Back with coverage now.  Not on lisinopril, lipitor.  No n/v/abd pain. Some increased thirst/urinary frequency.  Not checking blood sugar.  Agrees to restarting meds today, optho eval.  No recent microalbumin.    Lab Results  Component Value Date   HGBA1C 14.0 (A) 12/12/2019   HGBA1C 12.5 12/11/2017   HGBA1C 7.8 09/10/2017   Lab Results  Component Value Date   MICROALBUR 0.8 06/22/2016   LDLCALC 59  04/07/2017   CREATININE 0.93 03/12/2019    History Patient Active Problem List   Diagnosis Date Noted  . UARS (upper airway resistance syndrome) 04/07/2015  . Severe obesity (BMI >= 40) ([place]) 04/07/2015  . Primary snoring 04/07/2015  . Snoring 12/21/2014  . Drowsiness 12/21/2014  . Witnessed apneic spells 12/21/2014  . ED (erectile dysfunction) 09/24/2012  . Diabetes mellitus type 2 in obese ([place]) 07/04/2009    Class: Chronic  . OBESITY, NOS 01/17/2007  . BELLS PALSY 01/17/2007  . HYPERTENSION, BENIGN SYSTEMIC 01/17/2007   Past Medical History:  Diagnosis Date  . Cataract 2011   surgery on R eye  . Diabetes mellitus without complication ([place])   . High cholesterol   . Obesity    Past Surgical History:  Procedure Laterality Date  . EYE SURGERY Left 2011   cataract  . EYE SURGERY Right 2012   No Known Allergies Prior to Admission medications   Medication Sig Start Date End Date Taking? Authorizing Provider  amoxicillin-clavulanate (AUGMENTIN) 875-125 MG tablet Take 1 tablet by mouth every 12 (twelve) hours for 5 days. 12/08/19 12/13/19 Yes Burky, Malachy Moan, NP  fluticasone (FLONASE) 50 MCG/ACT nasal spray Place 1 spray into both nostrils daily. 12/08/19  Yes Burky, Lanelle Bal B, NP  ibuprofen (ADVIL) 600 MG tablet Take 1 tablet (600 mg total) by mouth every 6 (six) hours as needed. 10/06/19  Yes Wieters, Hallie C, PA-C  Insulin Glargine (LANTUS  SOLOSTAR) 100 UNIT/ML Solostar Pen Inject 10 Units into the skin every morning. And pen needles 1/day 03/21/19  Yes Wendie Agreste, MD  lisinopril (ZESTRIL) 5 MG tablet Take 1 tablet (5 mg total) by mouth daily. 03/21/19  Yes Wendie Agreste, MD  atorvastatin (LIPITOR) 10 MG tablet Take 1 tablet (10 mg total) by mouth daily. Patient not taking: Reported on 03/21/2019 01/12/17 10/06/19  Jaynee Eagles, PA-C  bromocriptine (PARLODEL) 2.5 MG tablet 1/4 tab daily Patient not taking: Reported on 03/12/2019 01/11/18 10/06/19  Renato Shin, MD    glimepiride (AMARYL) 1 MG tablet Take 0.5 tablets (0.5 mg total) by mouth daily with breakfast. Patient not taking: Reported on 03/12/2019 01/11/18 10/06/19  Renato Shin, MD  sitaGLIPtin (JANUVIA) 100 MG tablet TAKE 1 TABLET (100 MG TOTAL) BY MOUTH DAILY. Patient not taking: Reported on 03/12/2019 01/11/18 10/06/19  Renato Shin, MD   Social History   Socioeconomic History  . Marital status: Married    Spouse name: Not on file  . Number of children: 1  . Years of education: 12+  . Highest education level: Not on file  Occupational History    Employer: [employer]  Tobacco Use  . Smoking status: Never Smoker  . Smokeless tobacco: Never Used  Substance and Sexual Activity  . Alcohol use: Not Currently    Alcohol/week: 0.0 standard drinks    Comment: 1 or 2 every 3 months   . Drug use: No  . Sexual activity: Not on file  Other Topics Concern  . Not on file  Social History Narrative   Lives at home with wife and stepson.   Caffeine use: none   Has one child.   Social Determinants of Health   Financial Resource Strain:   . Difficulty of Paying Living Expenses: Not on file  Food Insecurity:   . Worried About Charity fundraiser in the Last Year: Not on file  . Ran Out of Food in the Last Year: Not on file  Transportation Needs:   . Lack of Transportation (Medical): Not on file  . Lack of Transportation (Non-Medical): Not on file  Physical Activity:   . Days of Exercise per Week: Not on file  . Minutes of Exercise per Session: Not on file  Stress:   . Feeling of Stress : Not on file  Social Connections:   . Frequency of Communication with Friends and Family: Not on file  . Frequency of Social Gatherings with Friends and Family: Not on file  . Attends Religious Services: Not on file  . Active Member of Clubs or Organizations: Not on file  . Attends Archivist Meetings: Not on file  . Marital Status: Not on file  Intimate Partner Violence:   . Fear of Current or  Ex-Partner: Not on file  . Emotionally Abused: Not on file  . Physically Abused: Not on file  . Sexually Abused: Not on file    Review of Systems  Constitutional: Negative for fatigue and unexpected weight change.  HENT: Positive for hearing loss. Negative for ear discharge and ear pain.   Eyes: Negative for visual disturbance.  Respiratory: Negative for cough, chest tightness and shortness of breath.   Cardiovascular: Negative for chest pain, palpitations and leg swelling.  Gastrointestinal: Negative for abdominal pain and blood in stool.  Neurological: Negative for dizziness, light-headedness and headaches.     Objective:   Vitals:   12/12/19 1013  BP: (!) 147/80  Pulse: 100  Resp:  16  Temp: 98.2 F (36.8 C)  TempSrc: Temporal  SpO2: 96%  Weight: 253 lb (114.8 kg)  Height: '5\' 10"'  (1.778 m)     Physical Exam Vitals reviewed.  Constitutional:      Appearance: He is well-developed.  HENT:     Head: Normocephalic and atraumatic.     Right Ear: External ear normal. A middle ear effusion (min clear at base. ) is present. No foreign body. No mastoid tenderness. Tympanic membrane is injected (min bilat). Tympanic membrane is not perforated, erythematous or bulging.     Left Ear: External ear normal. A middle ear effusion (min clear at base) is present. No foreign body. No mastoid tenderness. Tympanic membrane is injected. Tympanic membrane is not perforated, erythematous or bulging.     Ears:     Weber exam findings: lateralizes left.    Right Rinne: BC > AC. Eyes:     Pupils: Pupils are equal, round, and reactive to light.  Neck:     Vascular: No carotid bruit or JVD.  Cardiovascular:     Rate and Rhythm: Normal rate and regular rhythm.     Heart sounds: Normal heart sounds. No murmur.  Pulmonary:     Effort: Pulmonary effort is normal.     Breath sounds: Normal breath sounds. No rales.  Skin:    General: Skin is warm and dry.  Neurological:     Mental Status: He  is alert and oriented to person, place, and time.     Results for orders placed or performed in visit on 12/12/19  POCT glycosylated hemoglobin (Hb A1C)  Result Value Ref Range   Hemoglobin A1C 14.0 (A) 4.0 - 5.6 %   HbA1c POC (<> result, manual entry)     HbA1c, POC (prediabetic range)     HbA1c, POC (controlled diabetic range)    POCT glucose (manual entry)  Result Value Ref Range   POC Glucose HHH 70 - 99 mg/dl    Assessment & Plan:  KAISEN ACKERS is a 39 y.o. male . Hearing loss of left ear, unspecified hearing loss type - Plan: Ambulatory referral to ENT  -4 days of symptoms, possible slight effusion at base, but testing concerning for possible sensorineural hearing loss with Weber localizing to left.  Initially did not have any hearing of bone-conduction with Rinne, then did note some bone-conduction, no air conduction on affected ear.  This would suggest possible conductive hearing loss.  With uncontrolled diabetes I am hesitant to start prednisone at this time.  We will have him evaluated urgently with ENT.  Continue Flonase for now for possible conductive loss causes.  3:36 PM Unable to arrange ENT eval urgently today, but has appt planned with Dr. Benjamine Mola in 3 days.  Will try afrin ns temporarily for next 3 days, then stop unless advised otherwise by ENT. Potential side effects discussed.   Uncontrolled type 2 diabetes mellitus with diabetic cataract, without long-term current use of insulin ([place]) - Plan: Insulin Glargine (LANTUS SOLOSTAR) 100 UNIT/ML Solostar Pen, Ambulatory referral to Ophthalmology, HM Diabetes Foot Exam, POCT glycosylated hemoglobin (Hb A1C), POCT glucose (manual entry), Lipid panel, Microalbumin / creatinine urine ratio, atorvastatin (LIPITOR) 10 MG tablet Nonadherence to medication  -Discussed potential barriers to his care., as well as possible complications with uncontrolled diabetes.  Does agree to restart medication.  Check baseline labs.  Restart statin,  ACE inhibitor, lantus insulin for now with home monitoring and PCP follow-up.  ER precautions given.  May  need endocrinology follow-up.  Essential hypertension - Plan: Comprehensive metabolic panel, lisinopril (ZESTRIL) 5 MG tablet  -Restart meds  At increased risk for cardiovascular disease - Plan: Lipid panel, atorvastatin (LIPITOR) 10 MG tablet Obesity, unspecified classification, unspecified obesity type, unspecified whether serious comorbidity present - Plan: atorvastatin (LIPITOR) 10 MG tablet   -Restart statin.  Baseline testing obtained today.  Over 40 mins of care with in office evaluation, history, discussion, coordination of care as well as follow-up phone calls with patient.  Meds ordered this encounter  Medications  . Insulin Glargine (LANTUS SOLOSTAR) 100 UNIT/ML Solostar Pen    Sig: Inject 10 Units into the skin every morning. And pen needles 1/day    Dispense:  5 pen    Refill:  PRN  . lisinopril (ZESTRIL) 5 MG tablet    Sig: Take 1 tablet (5 mg total) by mouth daily.    Dispense:  90 tablet    Refill:  3  . atorvastatin (LIPITOR) 10 MG tablet    Sig: Take 1 tablet (10 mg total) by mouth daily.    Dispense:  90 tablet    Refill:  3   Patient Instructions  Restart lantus 10 units per day then increase by 2 units ever 3 days until readings below 200. Once at that level - stay at same dose of insulin until follow up in 2 weeks.  Return to the clinic or go to the nearest emergency room if any of your symptoms worsen or new symptoms occur.   I will have you evaluated by ear nose and throat specialist for your ear. continue flonase for now.    Type 2 Diabetes Mellitus, Self Care, Adult Caring for yourself after you have been diagnosed with type 2 diabetes (type 2 diabetes mellitus) means keeping your blood sugar (glucose) under control with a balance of:  Nutrition.  Exercise.  Lifestyle changes.  Medicines or insulin, if necessary.  Support from your team of  health care providers and others. The following information explains what you need to know to manage your diabetes at home. What are the risks? Having diabetes can put you at risk for other long-term (chronic) conditions, such as heart disease and kidney disease. Your health care provider may prescribe medicines to help prevent complications from diabetes. These medicines may include:  Aspirin.  Medicine to lower cholesterol.  Medicine to control blood pressure. How to monitor blood glucose   Check your blood glucose every day, as often as told by your health care provider.  Have your A1c (hemoglobin A1c) level checked two or more times a year, or as often as told by your health care provider. Your health care provider will set individualized treatment goals for you. Generally, the goal of treatment is to maintain the following blood glucose levels:  Before meals (preprandial): 80-130 mg/dL (4.4-7.2 mmol/L).  After meals (postprandial): below 180 mg/dL (10 mmol/L).  A1c level: less than 7%. How to manage hyperglycemia and hypoglycemia Hyperglycemia symptoms Hyperglycemia, also called high blood glucose, occurs when blood glucose is too high. Make sure you know the early signs of hyperglycemia, such as:  Increased thirst.  Hunger.  Feeling very tired.  Needing to urinate more often than usual.  Blurry vision. Hypoglycemia symptoms Hypoglycemia, also called low blood glucose, occurswith a blood glucose level at or below 70 mg/dL (3.9 mmol/L). The risk for hypoglycemia increases during or after exercise, during sleep, during illness, and when skipping meals or not eating for a long time (fasting).  It is important to know the symptoms of hypoglycemia and treat it right away. Always have a 15-gram rapid-acting carbohydrate snack with you to treat low blood glucose. Family members and close friends should also know the symptoms and should understand how to treat hypoglycemia, in case  you are not able to treat yourself. Symptoms may include:  Hunger.  Anxiety.  Sweating and feeling clammy.  Confusion.  Dizziness or feeling light-headed.  Sleepiness.  Nausea.  Increased heart rate.  Headache.  Blurry vision.  Irritability.  A change in coordination.  Tingling or numbness around the mouth, lips, or tongue.  Restless sleep.  Fainting.  Seizure. Treating hypoglycemia If you are alert and able to swallow safely, follow the 15:15 rule:  Take 15 grams of a rapid-acting carbohydrate. Talk with your health care provider about how much you should take.  Rapid-acting options include: ? Glucose pills (take 15 grams). ? 6-8 pieces of hard candy. ? 4-6 oz (120-150 mL) of fruit juice. ? 4-6 oz (120-150 mL) of regular (not diet) soda. ? 1 Tbsp (15 mL) honey or sugar.  Check your blood glucose 15 minutes after you take the carbohydrate.  If the repeat blood glucose level is still at or below 70 mg/dL (3.9 mmol/L), take 15 grams of a carbohydrate again.  If your blood glucose level does not increase above 70 mg/dL (3.9 mmol/L) after 3 tries, seek emergency medical care.  After your blood glucose level returns to normal, eat a meal or a snack within 1 hour. Treating severe hypoglycemia Severe hypoglycemia is when your blood glucose level is at or below 54 mg/dL (3 mmol/L). Severe hypoglycemia is an emergency. Do not wait to see if the symptoms will go away. Get medical help right away. Call your local emergency services (911 in the [place] If you have severe hypoglycemia and you cannot eat or drink, you may need an injection of glucagon. A family member or close friend should learn how to check your blood glucose and how to give you a glucagon injection. Ask your health care provider if you need to have an emergency glucagon injection kit available. Severe hypoglycemia may need to be treated in a hospital. The treatment may include getting glucose through an IV.  You may also need treatment for the cause of your hypoglycemia. Follow these instructions at home: Take diabetes medicines as told  If your health care provider prescribed insulin or diabetes medicines, take them every day.  Do not run out of insulin or other diabetes medicines that you take. Plan ahead so you always have these available.  If you use insulin, adjust your dosage based on how physically active you are and what foods you eat. Your health care provider will tell you how to adjust your dosage. Make healthy food choices  The things that you eat and drink affect your blood glucose and your insulin dosage. Making good choices helps to control your diabetes and prevent other health problems. A healthy meal plan includes eating lean proteins, complex carbohydrates, fresh fruits and vegetables, low-fat dairy products, and healthy fats. Make an appointment to see a diet and nutrition specialist (registered dietitian) to help you create an eating plan that is right for you. Make sure that you:  Follow instructions from your health care provider about eating or drinking restrictions.  Drink enough fluid to keep your urine pale yellow.  Keep a record of the carbohydrates that you eat. Do this by reading food labels and learning the  standard serving sizes of foods.  Follow your sick day plan whenever you cannot eat or drink as usual. Make this plan in advance with your health care provider.  Stay active Exercise regularly, as told by your health care provider. This may include:  Stretching and doing strength exercises, such as yoga or weightlifting, 2 or more times a week.  Doing 150 minutes or more of moderate-intensity or vigorous-intensity exercise each week. This could be brisk walking, biking, or water aerobics. ? Spread out your activity over 3 or more days of the week. ? Do not go more than 2 days in a row without doing some kind of physical activity. When you start a new  exercise or activity, work with your health care provider to adjust your insulin, medicines, or food intake as needed. Make healthy lifestyle choices  Do not use any tobacco products, such as cigarettes, chewing tobacco, and e-cigarettes. If you need help quitting, ask your health care provider.  If your health care provider says that alcohol is safe for you, limit alcohol intake to no more than 1 drink per day for nonpregnant women and 2 drinks per day for men. One drink equals 12 oz of beer (355 mL), 5 oz of wine (148 mL), or 1 oz of hard liquor (44 mL).  Learn to manage stress. If you need help with this, ask your health care provider. Care for your body   Keep your immunizations up to date. In addition to getting vaccinations as told by your health care provider, it is recommended that you get vaccinated against the following illnesses: ? The flu (influenza). Get a flu shot every year. ? Pneumonia. ? Hepatitis B.  Schedule an eye exam soon after your diagnosis, and then one time every year after that.  Check your skin and feet every day for cuts, bruises, redness, blisters, or sores. Schedule a foot exam with your health care provider once every year.  Brush your teeth and gums two times a day, and floss one or more times a day. Visit your dentist one or more times every 6 months.  Maintain a healthy weight. General instructions  Take over-the-counter and prescription medicines only as told by your health care provider.  Share your diabetes management plan with people in your workplace, school, and household.  Carry a medical alert card or wear medical alert jewelry.  Keep all follow-up visits as told by your health care provider. This is important. Questions to ask your health care provider  Do I need to meet with a diabetes educator?  Where can I find a support group for people with diabetes? Where to find more information For more information about diabetes,  visit:  American Diabetes Association (ADA): www.diabetes.org  American Association of Diabetes Educators (AADE): www.diabeteseducator.org Summary  Caring for yourself after you have been diagnosed with (type 2 diabetes mellitus) means keeping your blood sugar (glucose) under control with a balance of nutrition, exercise, lifestyle changes, and medicine.  Check your blood glucose every day, as often as told by your health care provider.  Having diabetes can put you at risk for other long-term (chronic) conditions, such as heart disease and kidney disease. Your health care provider may prescribe medicines to help prevent complications from diabetes.  Keep all follow-up visits as told by your health care provider. This is important. This information is not intended to replace advice given to you by your health care provider. Make sure you discuss any questions you have with  your health care provider. Document Revised: 04/29/2018 Document Reviewed: 12/10/2015 Elsevier Patient Education  2020 [employer].       Signed, Merri Ray, MD [facility]

## 2019-12-13 LAB — MICROALBUMIN / CREATININE URINE RATIO
Creatinine, Urine: 38 mg/dL
Microalb/Creat Ratio: 45 mg/g creat — ABNORMAL HIGH (ref 0–29)
Microalbumin, Urine: 17 ug/mL

## 2019-12-13 LAB — COMPREHENSIVE METABOLIC PANEL
ALT: 17 IU/L (ref 0–44)
AST: 13 IU/L (ref 0–40)
Albumin/Globulin Ratio: 1.9 (ref 1.2–2.2)
Albumin: 4.3 g/dL (ref 4.0–5.0)
Alkaline Phosphatase: 151 IU/L — ABNORMAL HIGH (ref 39–117)
BUN/Creatinine Ratio: 11 (ref 9–20)
BUN: 12 mg/dL (ref 6–20)
Bilirubin Total: 0.5 mg/dL (ref 0.0–1.2)
CO2: 21 mmol/L (ref 20–29)
Calcium: 9.4 mg/dL (ref 8.7–10.2)
Chloride: 98 mmol/L (ref 96–106)
Creatinine, Ser: 1.05 mg/dL (ref 0.76–1.27)
GFR calc Af Amer: 104 mL/min/{1.73_m2} (ref >59)
GFR calc non Af Amer: 90 mL/min/{1.73_m2} (ref >59)
Globulin, Total: 2.3 g/dL (ref 1.5–4.5)
Glucose: 574 mg/dL (ref 65–99)
Potassium: 4.6 mmol/L (ref 3.5–5.2)
Sodium: 136 mmol/L (ref 134–144)
Total Protein: 6.6 g/dL (ref 6.0–8.5)

## 2019-12-13 LAB — LIPID PANEL
Chol/HDL Ratio: 5.2 ratio — ABNORMAL HIGH (ref 0.0–5.0)
Cholesterol, Total: 197 mg/dL (ref 100–199)
HDL: 38 mg/dL — ABNORMAL LOW (ref >39)
LDL Chol Calc (NIH): 129 mg/dL — ABNORMAL HIGH (ref 0–99)
Triglycerides: 166 mg/dL — ABNORMAL HIGH (ref 0–149)
VLDL Cholesterol Cal: 30 mg/dL (ref 5–40)

## 2019-12-15 ENCOUNTER — Telehealth: Payer: Self-pay | Admitting: Family Medicine

## 2019-12-15 NOTE — Telephone Encounter (Signed)
Caller Santiago Glad from [employer] called with critical blood glucose of 574 for patient on 12/12/19. [facility] will fax to office. Call placed to office.  Spoke with Lolita Patella who will alert Dr Carlota Raspberry and clinical staff of result and to look that fax result .

## 2019-12-15 NOTE — Telephone Encounter (Signed)
[facility] RN alerted office of patient's glucose level. Informed me that fax will be coming in with results, but results can be viewed on his chart. Please be aware

## 2019-12-18 ENCOUNTER — Telehealth: Payer: Self-pay

## 2019-12-18 NOTE — Telephone Encounter (Signed)
Called pt @ 4:47 pm regarding his elevated blood sugar on his last visit. Pt did not answer. I did leave a VM for pt to please call us back.

## 2019-12-26 ENCOUNTER — Other Ambulatory Visit: Payer: Self-pay

## 2019-12-26 ENCOUNTER — Ambulatory Visit (INDEPENDENT_AMBULATORY_CARE_PROVIDER_SITE_OTHER): Payer: BC Managed Care – PPO | Admitting: Family Medicine

## 2019-12-26 ENCOUNTER — Encounter: Payer: Self-pay | Admitting: Family Medicine

## 2019-12-26 VITALS — BP 124/83 | HR 87 | Temp 97.8°F | Ht 70.0 in | Wt 253.0 lb

## 2019-12-26 DIAGNOSIS — E1136 Type 2 diabetes mellitus with diabetic cataract: Secondary | ICD-10-CM | POA: Diagnosis not present

## 2019-12-26 DIAGNOSIS — E1165 Type 2 diabetes mellitus with hyperglycemia: Secondary | ICD-10-CM | POA: Diagnosis not present

## 2019-12-26 DIAGNOSIS — H9191 Unspecified hearing loss, right ear: Secondary | ICD-10-CM

## 2019-12-26 DIAGNOSIS — IMO0002 Reserved for concepts with insufficient information to code with codable children: Secondary | ICD-10-CM

## 2019-12-26 LAB — GLUCOSE, POCT (MANUAL RESULT ENTRY): POC Glucose: 438 mg/dl — AB (ref 70–99)

## 2019-12-26 NOTE — Progress Notes (Signed)
Subjective:  Patient ID: Ian Hughes, male    DOB: 27-Aug-1981  Age: 39 y.o. MRN: [id number]  CC:  Chief Complaint  Patient presents with  . Follow-up    on type 2 diabetes. pt states no new issues with his diabetes since last visit.pt states his medication workswell with no side effects.pt is still having hearing issues from last visit.    HPI [name] presents for   Diabetes: Complicated by significant hyperglycemia, medication nonadherence.  Last visit January 22.  Previously treated by endocrinology but did not have recent endocrinology follow-up at that time.  Off medications last visit.  A1c 14, glucose 574. Restarted Lantus, statin, ACE at last visit. Taking lantus - clicks to #2? Unsure of dose - 2 units.  Denies n/v/abd pain. Frequent urination improved.  Home readings - 260-289.   Lab Results  Component Value Date   HGBA1C 14.0 (A) 12/12/2019   HGBA1C 12.5 12/11/2017   HGBA1C 7.8 09/10/2017   Lab Results  Component Value Date   MICROALBUR 0.8 06/22/2016   LDLCALC 129 (H) 12/12/2019   CREATININE 1.05 12/12/2019   R sided hearing loss: Discussed January 22 visit, previous urgent care eval.  Possible conductive hearing loss but had inconsistent testing with Weber, Rinne in office.  Urgent follow-up with ear nose and throat on January 25.  Went to ENT. Was unable to be seen, was told needed to pay $250 to be seen? Not sure of reason. Left. Has not been seen. No change in hearing. Still blocked, no pain. No vertigo. Still some ringing. No discharge.   History Patient Active Problem List   Diagnosis Date Noted  . UARS (upper airway resistance syndrome) 04/07/2015  . Severe obesity (BMI >= 40) ([place]) 04/07/2015  . Primary snoring 04/07/2015  . Snoring 12/21/2014  . Drowsiness 12/21/2014  . Witnessed apneic spells 12/21/2014  . ED (erectile dysfunction) 09/24/2012  . Diabetes mellitus type 2 in obese ([place]) 07/04/2009    Class: Chronic  . OBESITY, NOS  01/17/2007  . BELLS PALSY 01/17/2007  . HYPERTENSION, BENIGN SYSTEMIC 01/17/2007   Past Medical History:  Diagnosis Date  . Cataract 2011   surgery on R eye  . Diabetes mellitus without complication ([place])   . High cholesterol   . Obesity    Past Surgical History:  Procedure Laterality Date  . EYE SURGERY Left 2011   cataract  . EYE SURGERY Right 2012   No Known Allergies Prior to Admission medications   Medication Sig Start Date End Date Taking? Authorizing Provider  atorvastatin (LIPITOR) 10 MG tablet Take 1 tablet (10 mg total) by mouth daily. 12/12/19  Yes Wendie Agreste, MD  fluticasone (FLONASE) 50 MCG/ACT nasal spray Place 1 spray into both nostrils daily. 12/08/19  Yes Burky, Lanelle Bal B, NP  ibuprofen (ADVIL) 600 MG tablet Take 1 tablet (600 mg total) by mouth every 6 (six) hours as needed. 10/06/19  Yes Wieters, Hallie C, PA-C  Insulin Glargine (LANTUS SOLOSTAR) 100 UNIT/ML Solostar Pen Inject 10 Units into the skin every morning. And pen needles 1/day 12/12/19  Yes Wendie Agreste, MD  lisinopril (ZESTRIL) 5 MG tablet Take 1 tablet (5 mg total) by mouth daily. 12/12/19  Yes Wendie Agreste, MD  bromocriptine (PARLODEL) 2.5 MG tablet 1/4 tab daily Patient not taking: Reported on 03/12/2019 01/11/18 10/06/19  Renato Shin, MD  glimepiride (AMARYL) 1 MG tablet Take 0.5 tablets (0.5 mg total) by mouth daily with breakfast. Patient not taking:  Reported on 03/12/2019 01/11/18 10/06/19  Renato Shin, MD  sitaGLIPtin (JANUVIA) 100 MG tablet TAKE 1 TABLET (100 MG TOTAL) BY MOUTH DAILY. Patient not taking: Reported on 03/12/2019 01/11/18 10/06/19  Renato Shin, MD   Social History   Socioeconomic History  . Marital status: Married    Spouse name: Not on file  . Number of children: 1  . Years of education: 12+  . Highest education level: Not on file  Occupational History    Employer: [employer]  Tobacco Use  . Smoking status: Never Smoker  . Smokeless tobacco: Never Used    Substance and Sexual Activity  . Alcohol use: Not Currently    Alcohol/week: 0.0 standard drinks    Comment: 1 or 2 every 3 months   . Drug use: No  . Sexual activity: Not on file  Other Topics Concern  . Not on file  Social History Narrative   Lives at home with wife and stepson.   Caffeine use: none   Has one child.   Social Determinants of Health   Financial Resource Strain:   . Difficulty of Paying Living Expenses: Not on file  Food Insecurity:   . Worried About Charity fundraiser in the Last Year: Not on file  . Ran Out of Food in the Last Year: Not on file  Transportation Needs:   . Lack of Transportation (Medical): Not on file  . Lack of Transportation (Non-Medical): Not on file  Physical Activity:   . Days of Exercise per Week: Not on file  . Minutes of Exercise per Session: Not on file  Stress:   . Feeling of Stress : Not on file  Social Connections:   . Frequency of Communication with Friends and Family: Not on file  . Frequency of Social Gatherings with Friends and Family: Not on file  . Attends Religious Services: Not on file  . Active Member of Clubs or Organizations: Not on file  . Attends Archivist Meetings: Not on file  . Marital Status: Not on file  Intimate Partner Violence:   . Fear of Current or Ex-Partner: Not on file  . Emotionally Abused: Not on file  . Physically Abused: Not on file  . Sexually Abused: Not on file    Review of Systems   Objective:   Vitals:   12/26/19 1119  BP: 124/83  Pulse: 87  Temp: 97.8 F (36.6 C)  TempSrc: Temporal  SpO2: 97%  Weight: 253 lb (114.8 kg)  Height: 5\' 10"  (1.778 m)     Physical Exam Vitals reviewed.  Constitutional:      Appearance: He is well-developed.  HENT:     Head: Normocephalic and atraumatic.     Right Ear: Ear canal and external ear normal. There is no impacted cerumen.     Left Ear: Ear canal and external ear normal. There is no impacted cerumen.     Ears:      Comments: No mastoid tenderness, external ear and canal normal.  Minimal clear fluid at the base of the left TM but pearly gray.  Right TM slightly dull in appearance with some clear fluid behind TM as well, no perforation seen, canal clear.  Weber localizes left, denies any bone-conduction sound on the right but is able to hear air conduction with tuning fork Eyes:     Pupils: Pupils are equal, round, and reactive to light.  Neck:     Vascular: No carotid bruit or JVD.  Cardiovascular:  Rate and Rhythm: Normal rate and regular rhythm.     Heart sounds: Normal heart sounds. No murmur.  Pulmonary:     Effort: Pulmonary effort is normal.     Breath sounds: Normal breath sounds. No rales.  Skin:    General: Skin is warm and dry.  Neurological:     Mental Status: He is alert and oriented to person, place, and time.     Results for orders placed or performed in visit on 12/26/19  POCT glucose (manual entry)  Result Value Ref Range   POC Glucose 438 (A) 70 - 99 mg/dl     Assessment & Plan:  ORESTE CASTIGLIONI is a 39 y.o. male . Uncontrolled type 2 diabetes mellitus with diabetic cataract, without long-term current use of insulin ([place]) - Plan: POCT glucose (manual entry), Ambulatory referral to Endocrinology  -Uncontrolled, unfortunately some confusion regarding restart of medication last visit.  Increase insulin to 10 units/day then increase by 2 units every 2 days until readings below 200.  This plan was discussed with patient and understanding was expressed.  Hypoglycemia precautions discussed.  Refer to endocrinology, recheck 1 week.  ER precautions given.  Hearing loss of right ear, unspecified hearing loss type  -Unfortunately was not able to be seen by ENT as above.  New referral placed.  Testing in 3 months with presented with sensorineural versus conductive hearing loss.  Symptoms have remained the same.  Did not start prednisone last visit or currently for sensorineural hearing  loss given uncontrolled diabetes.  ER/RTC precautions discussed.   No orders of the defined types were placed in this encounter.  Patient Instructions    Increase lantus to 10 units today, then increase by 2 units every 2 days until readings below 200, then stay at that dose daily. If any low readings - let me know right away. Make sure to carry some form of sugar in case you drop low.  Recheck in 1 week.  I will also refer you to endocrinology.   ENT appointment rescheduled. If any changes or worsening prior to that time - be seen right away.    Return to the clinic or go to the nearest emergency room if any of your symptoms worsen or new symptoms occur.   If you have lab work done today you will be contacted with your lab results within the next 2 weeks.  If you have not heard from [place] then please contact us. The fastest way to get your results is to register for My Chart.   IF you received an x-ray today, you will receive an invoice from [facility]. Please contact [facility] at [phone] with questions or concerns regarding your invoice.   IF you received labwork today, you will receive an invoice from [place]. Please contact [facility] at [phone] with questions or concerns regarding your invoice.   Our billing staff will not be able to assist you with questions regarding bills from these companies.  You will be contacted with the lab results as soon as they are available. The fastest way to get your results is to activate your My Chart account. Instructions are located on the last page of this paperwork. If you have not heard from [place] regarding the results in 2 weeks, please contact this office.         Signed, Merri Ray, MD [facility]

## 2019-12-26 NOTE — Patient Instructions (Addendum)
  Increase lantus to 10 units today, then increase by 2 units every 2 days until readings below 200, then stay at that dose daily. If any low readings - let me know right away. Make sure to carry some form of sugar in case you drop low.  Recheck in 1 week.  I will also refer you to endocrinology.   ENT appointment rescheduled. If any changes or worsening prior to that time - be seen right away.    Return to the clinic or go to the nearest emergency room if any of your symptoms worsen or new symptoms occur.   If you have lab work done today you will be contacted with your lab results within the next 2 weeks.  If you have not heard from [place] then please contact us. The fastest way to get your results is to register for My Chart.   IF you received an x-ray today, you will receive an invoice from [facility] Rad[facility]. Please contact [facility] Radiology at [phone] with questions or concerns regarding your invoice.   IF you received labwork today, you will receive an invoice from [place]. Please contact [facility] at [phone] with questions or concerns regarding your invoice.   Our billing staff will not be able to assist you with questions regarding bills from these companies.  You will be contacted with the lab results as soon as they are available. The fastest way to get your results is to activate your My Chart account. Instructions are located on the last page of this paperwork. If you have not heard from [place] regarding the results in 2 weeks, please contact this office.

## 2020-01-02 ENCOUNTER — Ambulatory Visit (INDEPENDENT_AMBULATORY_CARE_PROVIDER_SITE_OTHER): Payer: BC Managed Care – PPO | Admitting: Family Medicine

## 2020-01-02 ENCOUNTER — Other Ambulatory Visit: Payer: Self-pay

## 2020-01-02 ENCOUNTER — Encounter: Payer: Self-pay | Admitting: Family Medicine

## 2020-01-02 VITALS — BP 140/80 | HR 70 | Temp 97.2°F | Ht 70.0 in | Wt 253.0 lb

## 2020-01-02 DIAGNOSIS — Z794 Long term (current) use of insulin: Secondary | ICD-10-CM

## 2020-01-02 DIAGNOSIS — E1165 Type 2 diabetes mellitus with hyperglycemia: Secondary | ICD-10-CM | POA: Diagnosis not present

## 2020-01-02 DIAGNOSIS — E1136 Type 2 diabetes mellitus with diabetic cataract: Secondary | ICD-10-CM

## 2020-01-02 MED ORDER — METFORMIN HCL 500 MG PO TABS: 500.0000 mg | ORAL_TABLET | Freq: Two times a day (BID) | ORAL | 1 refills | Status: DC

## 2020-01-02 NOTE — Patient Instructions (Addendum)
Continue lisinopril, but can take 2 pills/day for now to see if you can improve blood pressure slightly.  Start Metformin twice per day.  Lantus insulin 10 units/day initially, then in 2 days increase in the dial to 12 units if your blood sugars are still remaining above 200.  In another 2 days turn the dial to 14 units if your blood sugars are still remain 200.  Continue this increase by 2 units every 2 days until the blood sugars are below 200, then remain at that dose.  Should be injecting insulin once per day recheck in 1 week.    Insulin Injection Instructions, Using Insulin Pens, Adult A subcutaneous injection is a shot of medicine that is injected into the layer of fat and tissue between skin and muscle. People with type 1 diabetes must take insulin because their bodies do not make it. People with type 2 diabetes may need to take insulin. There are many different types of insulin. The type of insulin that you take may determine how many injections you give yourself and when you need to give the injections. Supplies needed:  Soap and water to wash hands.  Your insulin pen.  A new, unused needle.  Alcohol wipes.  A disposal container that is meant for sharp items (sharps container), such as an empty plastic bottle with a cover. How to choose a site for injection The body absorbs insulin differently, depending on where the insulin is injected (injection site). It is best to inject insulin into the same body area each time (for example, always in the abdomen), but you should use a different spot in that area for each injection. Do not inject the insulin in the same spot each time. There are five main areas that can be used for injecting. These areas include:  Abdomen. This is the preferred area.  Front of thigh.  Upper, outer side of thigh.  Upper, outer side of arm.  Upper, outer part of buttock. How to use an insulin pen  First, follow the steps for Get ready, then continue  with the steps for Inject the insulin. Get ready 1. Wash your hands with soap and water. If soap and water are not available, use hand sanitizer. 2. Before you give yourself an insulin injection, be sure to test your blood sugar level (blood glucose level) and write down that number. Follow any instructions from your health care provider about what to do if your blood glucose level is higher or lower than your normal range. 3. Check the expiration date and the type of insulin that is in the pen. 4. If you are using CLEAR insulin, check to see that it is clear and free of clumps. 5. If you are using CLOUDY insulin, do not shake the pen to get the injection ready. Instead, get it ready in one of these ways: ? Gently roll the pen between your palms several times. ? Tip the pen up and down several times. 6. Remove the cap from the insulin pen. 7. Use an alcohol wipe to clean the rubber tip of the pen. 8. Remove the protective paper tab from the disposable needle. Do not let the needle touch anything. 9. Screw a new, unused needle onto the pen. 10. Remove the outer plastic needle cover. Do not throw away the outer plastic cover yet. ? If the pen uses a special safety needle, leave the inner needle shield in place. ? If the pen does not use a special safety needle,  remove the inner plastic cover from the needle. 11. Follow the manufacturer's instructions to prime the insulin pen with the volume of insulin needed. Hold the pen with the needle pointing up, and push the button on the opposite end of the pen until a drop of insulin appears at the needle tip. If no insulin appears, repeat this step. 12. Turn the button (dial) to the number of units of insulin that you will be injecting. Inject the insulin 1. Use an alcohol wipe to clean the site where you will be injecting the needle. Let the site air-dry. 2. Hold the pen in the palm of your writing hand like a pencil. 3. If directed by your health care  provider, use your other hand to pinch and hold about an inch (2.5 cm) of skin at the injection site. Do not directly touch the cleaned part of the skin. 4. Gently but quickly, use your writing hand to put the needle straight into the skin. The needle should be at a 90-degree angle (perpendicular) to the skin. 5. When the needle is completely inserted into the skin, use your thumb or index finger of your writing hand to push the top button of the pen down all the way to inject the insulin. 6. Let go of the skin that you are pinching. Continue to hold the pen in place with your writing hand. 7. Wait 10 seconds, then pull the needle straight out of the skin. This will allow all of the insulin to go from the pen and needle into your body. 8. Carefully put the larger (outer) plastic cover of the needle back over the needle, then unscrew the capped needle and discard it in a sharps container, such as an empty plastic bottle with a cover. 9. Put the plastic cap back on the insulin pen. How to throw away supplies  Discard all used needles in a puncture-proof sharps disposal container. You can ask your local pharmacy about where you can get this kind of disposal container, or you can use an empty plastic liquid laundry detergent bottle that has a cover.  Follow the disposal regulations for the area where you live. Do not use any needle more than one time.  Throw away empty disposable pens in the regular trash. Questions to ask your health care provider  How often should I be taking insulin?  How often should I check my blood glucose?  What amount of insulin should I be taking at each time?  What are the side effects?  What should I do if my blood glucose is too high?  What should I do if my blood glucose is too low?  What should I do if I forget to take my insulin?  What number should I call if I have questions? Where to find more information  American Diabetes Association (ADA):  www.diabetes.org  American Association of Diabetes Educators (AADE) Patient Resources: https://www.diabeteseducator.org Summary  A subcutaneous injection is a shot of medicine that is injected into the layer of fat and tissue between skin and muscle.  Before you give yourself an insulin injection, be sure to test your blood sugar level (blood glucose level) and write down that number.  Check the expiration date and the type of insulin that is in the pen. The type of insulin that you take may determine how many injections you give yourself and when you need to give the injections.  It is best to inject insulin into the same body area each time (  for example, always in the abdomen), but you should use a different spot in that area for each injection. This information is not intended to replace advice given to you by your health care provider. Make sure you discuss any questions you have with your health care provider. Document Revised: 11/26/2017 Document Reviewed: 12/10/2015 Elsevier Patient Education  [employer].    If you have lab work done today you will be contacted with your lab results within the next 2 weeks.  If you have not heard from [place] then please contact us. The fastest way to get your results is to register for My Chart.   IF you received an x-ray today, you will receive an invoice from [facility]iology. Please contact [facility] Radiology at [phone] with questions or concerns regarding your invoice.   IF you received labwork today, you will receive an invoice from [place]. Please contact [facility] at [phone] with questions or concerns regarding your invoice.   Our billing staff will not be able to assist you with questions regarding bills from these companies.  You will be contacted with the lab results as soon as they are available. The fastest way to get your results is to activate your My Chart account. Instructions are located on the last page of this  paperwork. If you have not heard from [place] regarding the results in 2 weeks, please contact this office.

## 2020-01-02 NOTE — Progress Notes (Signed)
Subjective:  Patient ID: Ian Hughes, male    DOB: Apr 02, 1981  Age: 39 y.o. MRN: [id number]  CC:  Chief Complaint  Patient presents with  . Follow-up    on type 2 diabetes. pt states no change since last visit. pt states medications are working well with no side effects.     HPI Ian Hughes presents for   Type 2 diabetes complicated by hyperglycemia, medication nonadherence previously.  A1c of 14 with glucose of 574 on January 22 visit.  Seen in follow-up February 5.  He had restarted Lantus, but there was some confusion regarding dosing, was only taken 2 units at last visit.  Frequent urination had improved, no nausea, vomiting, abdominal pain and home readings were in the 200s. Advised to increase Lantus to 10 units initially then 2 units every 2 days until readings below 200.  Was also referred to endocrinology.  Home readings above 250, 333 this morning. Lowest 240.  Still some confusion - has been using 1 unit per day, alternating with 2 units on next day.  No n/v/blurry vision.  No endocrine appt yet.  taking lisinopril, lipitor - no new side effects.   History Patient Active Problem List   Diagnosis Date Noted  . UARS (upper airway resistance syndrome) 04/07/2015  . Severe obesity (BMI >= 40) ([place]) 04/07/2015  . Primary snoring 04/07/2015  . Snoring 12/21/2014  . Drowsiness 12/21/2014  . Witnessed apneic spells 12/21/2014  . ED (erectile dysfunction) 09/24/2012  . Diabetes mellitus type 2 in obese ([place]) 07/04/2009    Class: Chronic  . OBESITY, NOS 01/17/2007  . BELLS PALSY 01/17/2007  . HYPERTENSION, BENIGN SYSTEMIC 01/17/2007   Past Medical History:  Diagnosis Date  . Cataract 2011   surgery on R eye  . Diabetes mellitus without complication ([place])   . High cholesterol   . Obesity    Past Surgical History:  Procedure Laterality Date  . EYE SURGERY Left 2011   cataract  . EYE SURGERY Right 2012   No Known Allergies Prior to Admission medications     Medication Sig Start Date End Date Taking? Authorizing Provider  atorvastatin (LIPITOR) 10 MG tablet Take 1 tablet (10 mg total) by mouth daily. 12/12/19  Yes Wendie Agreste, MD  fluticasone (FLONASE) 50 MCG/ACT nasal spray Place 1 spray into both nostrils daily. 12/08/19  Yes Burky, Lanelle Bal B, NP  ibuprofen (ADVIL) 600 MG tablet Take 1 tablet (600 mg total) by mouth every 6 (six) hours as needed. 10/06/19  Yes Wieters, Hallie C, PA-C  Insulin Glargine (LANTUS SOLOSTAR) 100 UNIT/ML Solostar Pen Inject 10 Units into the skin every morning. And pen needles 1/day 12/12/19  Yes Wendie Agreste, MD  lisinopril (ZESTRIL) 5 MG tablet Take 1 tablet (5 mg total) by mouth daily. 12/12/19  Yes Wendie Agreste, MD  bromocriptine (PARLODEL) 2.5 MG tablet 1/4 tab daily Patient not taking: Reported on 03/12/2019 01/11/18 10/06/19  Renato Shin, MD  glimepiride (AMARYL) 1 MG tablet Take 0.5 tablets (0.5 mg total) by mouth daily with breakfast. Patient not taking: Reported on 03/12/2019 01/11/18 10/06/19  Renato Shin, MD  sitaGLIPtin (JANUVIA) 100 MG tablet TAKE 1 TABLET (100 MG TOTAL) BY MOUTH DAILY. Patient not taking: Reported on 03/12/2019 01/11/18 10/06/19  Renato Shin, MD   Social History   Socioeconomic History  . Marital status: Married    Spouse name: Not on file  . Number of children: 1  . Years of education: 12+  .  Highest education level: Not on file  Occupational History    Employer:   Tobacco Use  . Smoking status: Never Smoker  . Smokeless tobacco: Never Used  Substance and Sexual Activity  . Alcohol use: Not Currently    Alcohol/week: 0.0 standard drinks    Comment: 1 or 2 every 3 months   . Drug use: No  . Sexual activity: Not on file  Other Topics Concern  . Not on file  Social History Narrative   Lives at home with wife and stepson.   Caffeine use: none   Has one child.   Social Determinants of Health   Financial Resource Strain:   . Difficulty of Paying  Living Expenses: Not on file  Food Insecurity:   . Worried About Charity fundraiser in the Last Year: Not on file  . Ran Out of Food in the Last Year: Not on file  Transportation Needs:   . Lack of Transportation (Medical): Not on file  . Lack of Transportation (Non-Medical): Not on file  Physical Activity:   . Days of Exercise per Week: Not on file  . Minutes of Exercise per Session: Not on file  Stress:   . Feeling of Stress : Not on file  Social Connections:   . Frequency of Communication with Friends and Family: Not on file  . Frequency of Social Gatherings with Friends and Family: Not on file  . Attends Religious Services: Not on file  . Active Member of Clubs or Organizations: Not on file  . Attends Archivist Meetings: Not on file  . Marital Status: Not on file  Intimate Partner Violence:   . Fear of Current or Ex-Partner: Not on file  . Emotionally Abused: Not on file  . Physically Abused: Not on file  . Sexually Abused: Not on file    Review of Systems Per HPI.   Objective:   Vitals:   01/02/20 1428 01/02/20 1432  BP: (!) 153/92 140/80  Pulse: 70   Temp: (!) 97.2 F (36.2 C)   TempSrc: Temporal   SpO2: 98%   Weight: 253 lb (114.8 kg)   Height: 5\' 10"  (1.778 m)      Physical Exam Constitutional:      General: He is not in acute distress.    Appearance: He is well-developed.  HENT:     Head: Normocephalic and atraumatic.  Cardiovascular:     Rate and Rhythm: Normal rate.  Pulmonary:     Effort: Pulmonary effort is normal.  Neurological:     Mental Status: He is alert and oriented to person, place, and time.     Assessment & Plan:  Ian Hughes is a 39 y.o. male . Type 2 diabetes mellitus with diabetic cataract, with long-term current use of insulin ([place]) - Plan: metFORMIN (GLUCOPHAGE) 500 MG tablet  Type 2 diabetes mellitus with hyperglycemia, with long-term current use of insulin ([place])  -Unfortunately still some confusion regarding  dosing of medication.  I did have him return with his insulin pen and teaching was discussed on drawing up appropriate dose.  Handout also given.  Understanding expressed.  Start Lantus 10 units, then increase by 2 units every 2 days until readings below 200 but then remain at that dose.  Verified understanding of plan.     -Start metformin 500 mg twice daily  -Increase lisinopril to 10 mg daily. Recheck in 2 weeks with virtual visit.  RTC precautions.   Meds ordered this  encounter  Medications  . metFORMIN (GLUCOPHAGE) 500 MG tablet    Sig: Take 1 tablet (500 mg total) by mouth 2 (two) times daily with a meal.    Dispense:  180 tablet    Refill:  1   Patient Instructions   Continue lisinopril, but can take 2 pills/day for now to see if you can improve blood pressure slightly.  Start Metformin twice per day.  Lantus insulin 10 units/day initially, then in 2 days increase in the dial to 12 units if your blood sugars are still remaining above 200.  In another 2 days turn the dial to 14 units if your blood sugars are still remain 200.  Continue this increase by 2 units every 2 days until the blood sugars are below 200, then remain at that dose.  Should be injecting insulin once per day recheck in 1 week.    Insulin Injection Instructions, Using Insulin Pens, Adult A subcutaneous injection is a shot of medicine that is injected into the layer of fat and tissue between skin and muscle. People with type 1 diabetes must take insulin because their bodies do not make it. People with type 2 diabetes may need to take insulin. There are many different types of insulin. The type of insulin that you take may determine how many injections you give yourself and when you need to give the injections. Supplies needed:  Soap and water to wash hands.  Your insulin pen.  A new, unused needle.  Alcohol wipes.  A disposal container that is meant for sharp items (sharps container), such as an empty plastic  bottle with a cover. How to choose a site for injection The body absorbs insulin differently, depending on where the insulin is injected (injection site). It is best to inject insulin into the same body area each time (for example, always in the abdomen), but you should use a different spot in that area for each injection. Do not inject the insulin in the same spot each time. There are five main areas that can be used for injecting. These areas include:  Abdomen. This is the preferred area.  Front of thigh.  Upper, outer side of thigh.  Upper, outer side of arm.  Upper, outer part of buttock. How to use an insulin pen  First, follow the steps for Get ready, then continue with the steps for Inject the insulin. Get ready 1. Wash your hands with soap and water. If soap and water are not available, use hand sanitizer. 2. Before you give yourself an insulin injection, be sure to test your blood sugar level (blood glucose level) and write down that number. Follow any instructions from your health care provider about what to do if your blood glucose level is higher or lower than your normal range. 3. Check the expiration date and the type of insulin that is in the pen. 4. If you are using CLEAR insulin, check to see that it is clear and free of clumps. 5. If you are using CLOUDY insulin, do not shake the pen to get the injection ready. Instead, get it ready in one of these ways: ? Gently roll the pen between your palms several times. ? Tip the pen up and down several times. 6. Remove the cap from the insulin pen. 7. Use an alcohol wipe to clean the rubber tip of the pen. 8. Remove the protective paper tab from the disposable needle. Do not let the needle touch anything. 9. Screw a new,  unused needle onto the pen. 10. Remove the outer plastic needle cover. Do not throw away the outer plastic cover yet. ? If the pen uses a special safety needle, leave the inner needle shield in place. ? If the  pen does not use a special safety needle, remove the inner plastic cover from the needle. 11. Follow the manufacturer's instructions to prime the insulin pen with the volume of insulin needed. Hold the pen with the needle pointing up, and push the button on the opposite end of the pen until a drop of insulin appears at the needle tip. If no insulin appears, repeat this step. 12. Turn the button (dial) to the number of units of insulin that you will be injecting. Inject the insulin 1. Use an alcohol wipe to clean the site where you will be injecting the needle. Let the site air-dry. 2. Hold the pen in the palm of your writing hand like a pencil. 3. If directed by your health care provider, use your other hand to pinch and hold about an inch (2.5 cm) of skin at the injection site. Do not directly touch the cleaned part of the skin. 4. Gently but quickly, use your writing hand to put the needle straight into the skin. The needle should be at a 90-degree angle (perpendicular) to the skin. 5. When the needle is completely inserted into the skin, use your thumb or index finger of your writing hand to push the top button of the pen down all the way to inject the insulin. 6. Let go of the skin that you are pinching. Continue to hold the pen in place with your writing hand. 7. Wait 10 seconds, then pull the needle straight out of the skin. This will allow all of the insulin to go from the pen and needle into your body. 8. Carefully put the larger (outer) plastic cover of the needle back over the needle, then unscrew the capped needle and discard it in a sharps container, such as an empty plastic bottle with a cover. 9. Put the plastic cap back on the insulin pen. How to throw away supplies  Discard all used needles in a puncture-proof sharps disposal container. You can ask your local pharmacy about where you can get this kind of disposal container, or you can use an empty plastic liquid laundry detergent  bottle that has a cover.  Follow the disposal regulations for the area where you live. Do not use any needle more than one time.  Throw away empty disposable pens in the regular trash. Questions to ask your health care provider  How often should I be taking insulin?  How often should I check my blood glucose?  What amount of insulin should I be taking at each time?  What are the side effects?  What should I do if my blood glucose is too high?  What should I do if my blood glucose is too low?  What should I do if I forget to take my insulin?  What number should I call if I have questions? Where to find more information  American Diabetes Association (ADA): www.diabetes.org  American Association of Diabetes Educators (AADE) Patient Resources: https://www.diabeteseducator.org Summary  A subcutaneous injection is a shot of medicine that is injected into the layer of fat and tissue between skin and muscle.  Before you give yourself an insulin injection, be sure to test your blood sugar level (blood glucose level) and write down that number.  Check the expiration  date and the type of insulin that is in the pen. The type of insulin that you take may determine how many injections you give yourself and when you need to give the injections.  It is best to inject insulin into the same body area each time (for example, always in the abdomen), but you should use a different spot in that area for each injection. This information is not intended to replace advice given to you by your health care provider. Make sure you discuss any questions you have with your health care provider. Document Revised: 11/26/2017 Document Reviewed: 12/10/2015 Elsevier Patient Education  [employer].    If you have lab work done today you will be contacted with your lab results within the next 2 weeks.  If you have not heard from [place] then please contact us. The fastest way to get your results is to  register for My Chart.   IF you received an x-ray today, you will receive an invoice from [facility]. Please contact [facility] at [phone] with questions or concerns regarding your invoice.   IF you received labwork today, you will receive an invoice from [facility]. Please contact [facility] at [phone] with questions or concerns regarding your invoice.   Our billing staff will not be able to assist you with questions regarding bills from these companies.  You will be contacted with the lab results as soon as they are available. The fastest way to get your results is to activate your My Chart account. Instructions are located on the last page of this paperwork. If you have not heard from [place] regarding the results in 2 weeks, please contact this office.         Signed, Merri Ray, MD [facility]

## 2020-01-07 ENCOUNTER — Other Ambulatory Visit: Payer: Self-pay

## 2020-01-07 ENCOUNTER — Ambulatory Visit (INDEPENDENT_AMBULATORY_CARE_PROVIDER_SITE_OTHER): Payer: BC Managed Care – PPO | Admitting: Otolaryngology

## 2020-01-07 VITALS — Temp 96.4°F

## 2020-01-07 DIAGNOSIS — H9120 Sudden idiopathic hearing loss, unspecified ear: Secondary | ICD-10-CM | POA: Diagnosis not present

## 2020-01-07 DIAGNOSIS — H9041 Sensorineural hearing loss, unilateral, right ear, with unrestricted hearing on the contralateral side: Secondary | ICD-10-CM | POA: Diagnosis not present

## 2020-01-07 DIAGNOSIS — H9311 Tinnitus, right ear: Secondary | ICD-10-CM | POA: Diagnosis not present

## 2020-01-07 NOTE — Progress Notes (Addendum)
HPI: Ian Hughes is a 39 y.o. male who presents is referred by his PCP for evaluation of right ear hearing loss.  This apparently developed suddenly on January 18 when he woke up and could not hear out of his right ear.  Denied any pain or drainage from the ear.  He was seen initially at urgent care and treated with antibiotics.  He was subsequently seen at his PCP and referred here. Patient also has poorly controlled type 2 diabetes.  He relates that his last hemoglobin A1c was 10.  He checks his blood sugars twice a day and are frequently over 300.  Past Medical History:  Diagnosis Date  . Cataract 2011   surgery on R eye  . Diabetes mellitus without complication ([place])   . High cholesterol   . Obesity    Past Surgical History:  Procedure Laterality Date  . EYE SURGERY Left 2011   cataract  . EYE SURGERY Right 2012   Social History   Socioeconomic History  . Marital status: Married    Spouse name: Not on file  . Number of children: 1  . Years of education: 12+  . Highest education level: Not on file  Occupational History    Employer: [employer]  Tobacco Use  . Smoking status: Never Smoker  . Smokeless tobacco: Never Used  Substance and Sexual Activity  . Alcohol use: Not Currently    Alcohol/week: 0.0 standard drinks    Comment: 1 or 2 every 3 months   . Drug use: No  . Sexual activity: Not on file  Other Topics Concern  . Not on file  Social History Narrative   Lives at home with wife and stepson.   Caffeine use: none   Has one child.   Social Determinants of Health   Financial Resource Strain:   . Difficulty of Paying Living Expenses: Not on file  Food Insecurity:   . Worried About Charity fundraiser in the Last Year: Not on file  . Ran Out of Food in the Last Year: Not on file  Transportation Needs:   . Lack of Transportation (Medical): Not on file  . Lack of Transportation (Non-Medical): Not on file  Physical Activity:   . Days of Exercise per Week: Not  on file  . Minutes of Exercise per Session: Not on file  Stress:   . Feeling of Stress : Not on file  Social Connections:   . Frequency of Communication with Friends and Family: Not on file  . Frequency of Social Gatherings with Friends and Family: Not on file  . Attends Religious Services: Not on file  . Active Member of Clubs or Organizations: Not on file  . Attends Archivist Meetings: Not on file  . Marital Status: Not on file   Family History  Problem Relation Age of Onset  . Diabetes Mother   . Cancer Father        leukemia   . Obesity Other    No Known Allergies Prior to Admission medications   Medication Sig Start Date End Date Taking? Authorizing Provider  atorvastatin (LIPITOR) 10 MG tablet Take 1 tablet (10 mg total) by mouth daily. 12/12/19  Yes Wendie Agreste, MD  fluticasone (FLONASE) 50 MCG/ACT nasal spray Place 1 spray into both nostrils daily. 12/08/19  Yes Burky, Lanelle Bal B, NP  ibuprofen (ADVIL) 600 MG tablet Take 1 tablet (600 mg total) by mouth every 6 (six) hours as needed. 10/06/19  Yes Wieters, Hallie C, PA-C  Insulin Glargine (LANTUS SOLOSTAR) 100 UNIT/ML Solostar Pen Inject 10 Units into the skin every morning. And pen needles 1/day 12/12/19  Yes Wendie Agreste, MD  lisinopril (ZESTRIL) 5 MG tablet Take 1 tablet (5 mg total) by mouth daily. 12/12/19  Yes Wendie Agreste, MD  metFORMIN (GLUCOPHAGE) 500 MG tablet Take 1 tablet (500 mg total) by mouth 2 (two) times daily with a meal. 01/02/20  Yes Wendie Agreste, MD  bromocriptine (PARLODEL) 2.5 MG tablet 1/4 tab daily Patient not taking: Reported on 03/12/2019 01/11/18 10/06/19  Renato Shin, MD  glimepiride (AMARYL) 1 MG tablet Take 0.5 tablets (0.5 mg total) by mouth daily with breakfast. Patient not taking: Reported on 03/12/2019 01/11/18 10/06/19  Renato Shin, MD  sitaGLIPtin (JANUVIA) 100 MG tablet TAKE 1 TABLET (100 MG TOTAL) BY MOUTH DAILY. Patient not taking: Reported on 03/12/2019 01/11/18  10/06/19  Renato Shin, MD     Positive ROS: No dizziness or vertigo  All other systems have been reviewed and were otherwise negative with the exception of those mentioned in the HPI and as above.  Physical Exam: Constitutional: Alert, well-appearing, no acute distress Ears: External ears without lesions or tenderness. Ear canals are clear bilaterally with intact, clear TMs bilaterally.  On tuning fork testing he had very minimal hearing in the right ear. Nasal: External nose without lesions.  Mild rhinitis.  Clear nasal passages with no signs of infection. Oral: Lips and gums without lesions. Tongue and palate mucosa without lesions. Posterior oropharynx clear. Neck: No palpable adenopathy or masses Respiratory: Breathing comfortably  Skin: No facial/neck lesions or rash noted.  Audiogram today demonstrated severe right ear sensorineural hearing loss with pure-tones estimated to be approximately 70 - 80 dB.  Normal hearing on the left side with SRT of 10 dB.  Type A tympanograms bilaterally  Procedures  Assessment: Sudden right ear sensorineural hearing loss. Poorly controlled type II diabetic Poorly controlled diabetes mellitus  Plan: Prescribed prednisone starting with 60 mg daily for 3 days followed by 50 mg daily for 3 days and then taper down over the following 8 days.  Reviewed with him concerning steroids causing elevation of his blood sugars would recommend more frequent testing of his blood sugars as well as better control of his diabetes with insulin.  He will contact his medical provider concerning the best way to control his diabetes. He will follow-up in 2 weeks for recheck and repeat audiologic testing.   Radene Journey, MD   CC:

## 2020-01-09 ENCOUNTER — Encounter (INDEPENDENT_AMBULATORY_CARE_PROVIDER_SITE_OTHER): Payer: Self-pay

## 2020-01-14 ENCOUNTER — Other Ambulatory Visit: Payer: Self-pay

## 2020-01-14 ENCOUNTER — Telehealth (INDEPENDENT_AMBULATORY_CARE_PROVIDER_SITE_OTHER): Payer: BC Managed Care – PPO | Admitting: Family Medicine

## 2020-01-14 VITALS — Ht 70.0 in | Wt 254.0 lb

## 2020-01-14 DIAGNOSIS — Z794 Long term (current) use of insulin: Secondary | ICD-10-CM

## 2020-01-14 DIAGNOSIS — E1165 Type 2 diabetes mellitus with hyperglycemia: Secondary | ICD-10-CM

## 2020-01-14 DIAGNOSIS — H9191 Unspecified hearing loss, right ear: Secondary | ICD-10-CM | POA: Diagnosis not present

## 2020-01-14 NOTE — Progress Notes (Signed)
Virtual Visit via Video Note  I connected with [name] on 01/14/20 at 5:14 PM by a video enabled telemedicine application doximity, then reverted to audio - connection issues. and verified that I am speaking with the correct person using two identifiers.   I discussed the limitations, risks, security and privacy concerns of performing an evaluation and management service by telephone and the availability of in person appointments. I also discussed with the patient that there may be a patient responsible charge related to this service. The patient expressed understanding and agreed to proceed, consent obtained  Chief complaint: Chief Complaint  Patient presents with  . Follow-up    on type 2 Diabetes. pt reports no issues with this condtition since last visit. B.S. pt states his BS has been inconsistant because of the stariod he had been taking.      History of Present Illness: Ian Hughes is a 39 y.o. male   Diabetes:Complicated by hyperglycemia, diabetic cataract with long-term use of insulin   -some confusion regarding dosing of Lantus past 2 visits.  Did in person teaching with his insulin pen at February 12 visit with plan for Lantus 10 units initially, 2 units increase every 2 days until readings below 200 and was started on Metformin 500 mg twice daily, lisinopril increased to 10 mg daily. Am reading 193 today. 12 units per night last night - has been 12 units every 1/3 night, 10 units on other nights. Misunderstanding on the dosing discussion form last visit.  It has been running in the mid 200's. No 300's.  No n/v/abd pain.   Seen by ENT 01/07/20. Diagnosed with sudden R ear sensorineural hearing loss,  Started on prednisone 60mg  QD for 3 days, then 50mg  for 3 days - now tapering. On 40mg  today  Plan for 2 week recheck.. Has not noticed any improvement at this point.   Patient Active Problem List   Diagnosis Date Noted  . UARS (upper airway resistance syndrome)  04/07/2015  . Severe obesity (BMI >= 40) ([name]) 04/07/2015  . Primary snoring 04/07/2015  . Snoring 12/21/2014  . Drowsiness 12/21/2014  . Witnessed apneic spells 12/21/2014  . ED (erectile dysfunction) 09/24/2012  . Diabetes mellitus type 2 in obese ([name]) 07/04/2009    Class: Chronic  . OBESITY, NOS 01/17/2007  . BELLS PALSY 01/17/2007  . HYPERTENSION, BENIGN SYSTEMIC 01/17/2007   Past Medical History:  Diagnosis Date  . Cataract 2011   surgery on R eye  . Diabetes mellitus without complication ([name])   . High cholesterol   . Obesity    Past Surgical History:  Procedure Laterality Date  . EYE SURGERY Left 2011   cataract  . EYE SURGERY Right 2012   No Known Allergies Prior to Admission medications   Medication Sig Start Date End Date Taking? Authorizing Provider  atorvastatin (LIPITOR) 10 MG tablet Take 1 tablet (10 mg total) by mouth daily. 12/12/19  Yes Wendie Agreste, MD  fluticasone (FLONASE) 50 MCG/ACT nasal spray Place 1 spray into both nostrils daily. 12/08/19  Yes Burky, Lanelle Bal B, NP  ibuprofen (ADVIL) 600 MG tablet Take 1 tablet (600 mg total) by mouth every 6 (six) hours as needed. 10/06/19  Yes Wieters, Hallie C, PA-C  Insulin Glargine (LANTUS SOLOSTAR) 100 UNIT/ML Solostar Pen Inject 10 Units into the skin every morning. And pen needles 1/day 12/12/19  Yes Wendie Agreste, MD  lisinopril (ZESTRIL) 5 MG tablet Take 1 tablet (5 mg total) by mouth daily.  12/12/19  Yes Wendie Agreste, MD  metFORMIN (GLUCOPHAGE) 500 MG tablet Take 1 tablet (500 mg total) by mouth 2 (two) times daily with a meal. 01/02/20  Yes Wendie Agreste, MD  bromocriptine (PARLODEL) 2.5 MG tablet 1/4 tab daily Patient not taking: Reported on 03/12/2019 01/11/18 10/06/19  Renato Shin, MD  glimepiride (AMARYL) 1 MG tablet Take 0.5 tablets (0.5 mg total) by mouth daily with breakfast. Patient not taking: Reported on 03/12/2019 01/11/18 10/06/19  Renato Shin, MD  sitaGLIPtin (JANUVIA) 100 MG  tablet TAKE 1 TABLET (100 MG TOTAL) BY MOUTH DAILY. Patient not taking: Reported on 03/12/2019 01/11/18 10/06/19  Renato Shin, MD   Social History   Socioeconomic History  . Marital status: Married    Spouse name: Not on file  . Number of children: 1  . Years of education: 12+  . Highest education level: Not on file  Occupational History    Employer: [employer]  Tobacco Use  . Smoking status: Never Smoker  . Smokeless tobacco: Never Used  Substance and Sexual Activity  . Alcohol use: Not Currently    Alcohol/week: 0.0 standard drinks    Comment: 1 or 2 every 3 months   . Drug use: No  . Sexual activity: Not on file  Other Topics Concern  . Not on file  Social History Narrative   Lives at home with wife and stepson.   Caffeine use: none   Has one child.   Social Determinants of Health   Financial Resource Strain:   . Difficulty of Paying Living Expenses: Not on file  Food Insecurity:   . Worried About Charity fundraiser in the Last Year: Not on file  . Ran Out of Food in the Last Year: Not on file  Transportation Needs:   . Lack of Transportation (Medical): Not on file  . Lack of Transportation (Non-Medical): Not on file  Physical Activity:   . Days of Exercise per Week: Not on file  . Minutes of Exercise per Session: Not on file  Stress:   . Feeling of Stress : Not on file  Social Connections:   . Frequency of Communication with Friends and Family: Not on file  . Frequency of Social Gatherings with Friends and Family: Not on file  . Attends Religious Services: Not on file  . Active Member of Clubs or Organizations: Not on file  . Attends Archivist Meetings: Not on file  . Marital Status: Not on file  Intimate Partner Violence:   . Fear of Current or Ex-Partner: Not on file  . Emotionally Abused: Not on file  . Physically Abused: Not on file  . Sexually Abused: Not on file    Observations/Objective: There were no vitals filed for this visit.  No  distress on phone, al questions answered with understanding of plan expressed.   Assessment and Plan: Type 2 diabetes mellitus with hyperglycemia, with long-term current use of insulin ([name])  - unfortunately prior plan for increasing doses not done, but improving readings as above. Will continue lantus at 12 units for now with recheck in 1 week, but close monitoring of home readings and RTC precautions on prednisone, call if readings in 300 range, may need to increase lantus sooner than next visit  - continue metformin.   Hearing loss of right ear, unspecified hearing loss type  - now on tapering dose of prednisone, followed by ENT. Continue follow up as planned next week, monitor glucose readings as above  with diabetes.   Follow Up Instructions:  Recheck 1 week.   Patient Instructions   Continue Lantus insulin 12 units per night at this time.  Recheck in 1 week and can discuss home readings at that time and further changes.  Continue Metformin and lisinopril at current doses for now.   if you have any blood sugar readings in the 300s, call me right away so we can change dosing sooner.  Please let me know if there are questions.    If you have lab work done today you will be contacted with your lab results within the next 2 weeks.  If you have not heard from [place] then please contact us. The fastest way to get your results is to register for My Chart.   IF you received an x-ray today, you will receive an invoice from [facility]. Please contact [facility] at [phone] with questions or concerns regarding your invoice.   IF you received labwork today, you will receive an invoice from [place]. Please contact [facility] at [phone] with questions or concerns regarding your invoice.   Our billing staff will not be able to assist you with questions regarding bills from these companies.  You will be contacted with the lab results as soon as they are available. The fastest  way to get your results is to activate your My Chart account. Instructions are located on the last page of this paperwork. If you have not heard from [place] regarding the results in 2 weeks, please contact this office.         I discussed the assessment and treatment plan with the patient. The patient was provided an opportunity to ask questions and all were answered. The patient agreed with the plan and demonstrated an understanding of the instructions.   The patient was advised to call back or seek an in-person evaluation if the symptoms worsen or if the condition fails to improve as anticipated.  I provided approx 9 minutes of non-face-to-face time during this encounter.   Wendie Agreste, MD

## 2020-01-14 NOTE — Patient Instructions (Addendum)
Continue Lantus insulin 12 units per night at this time.  Recheck in 1 week and can discuss home readings at that time and further changes.  Continue Metformin and lisinopril at current doses for now.   if you have any blood sugar readings in the 300s, call me right away so we can change dosing sooner.  Please let me know if there are questions.    If you have lab work done today you will be contacted with your lab results within the next 2 weeks.  If you have not heard from [place] then please contact us. The fastest way to get your results is to register for My Chart.   IF you received an x-ray today, you will receive an invoice from [facility]. Please contact [facility] at [phone] with questions or concerns regarding your invoice.   IF you received labwork today, you will receive an invoice from [place]. Please contact [facility] at [phone] with questions or concerns regarding your invoice.   Our billing staff will not be able to assist you with questions regarding bills from these companies.  You will be contacted with the lab results as soon as they are available. The fastest way to get your results is to activate your My Chart account. Instructions are located on the last page of this paperwork. If you have not heard from [place] regarding the results in 2 weeks, please contact this office.

## 2020-01-15 ENCOUNTER — Encounter: Payer: Self-pay | Admitting: Family Medicine

## 2020-01-21 ENCOUNTER — Telehealth (INDEPENDENT_AMBULATORY_CARE_PROVIDER_SITE_OTHER): Payer: BC Managed Care – PPO | Admitting: Family Medicine

## 2020-01-21 ENCOUNTER — Other Ambulatory Visit: Payer: Self-pay

## 2020-01-21 ENCOUNTER — Ambulatory Visit (INDEPENDENT_AMBULATORY_CARE_PROVIDER_SITE_OTHER): Payer: BC Managed Care – PPO | Admitting: Otolaryngology

## 2020-01-21 VITALS — Temp 97.0°F

## 2020-01-21 VITALS — Ht 70.0 in | Wt 254.0 lb

## 2020-01-21 DIAGNOSIS — Z794 Long term (current) use of insulin: Secondary | ICD-10-CM | POA: Diagnosis not present

## 2020-01-21 DIAGNOSIS — E1165 Type 2 diabetes mellitus with hyperglycemia: Secondary | ICD-10-CM

## 2020-01-21 DIAGNOSIS — H9071 Mixed conductive and sensorineural hearing loss, unilateral, right ear, with unrestricted hearing on the contralateral side: Secondary | ICD-10-CM | POA: Diagnosis not present

## 2020-01-21 DIAGNOSIS — H9041 Sensorineural hearing loss, unilateral, right ear, with unrestricted hearing on the contralateral side: Secondary | ICD-10-CM

## 2020-01-21 DIAGNOSIS — H9120 Sudden idiopathic hearing loss, unspecified ear: Secondary | ICD-10-CM

## 2020-01-21 DIAGNOSIS — H9311 Tinnitus, right ear: Secondary | ICD-10-CM | POA: Diagnosis not present

## 2020-01-21 NOTE — Progress Notes (Signed)
Virtual Visit via telephone Note No answer at [id number] - left VM to click link, or I will call back.  6:14 PM no answer.   I connected with [name] on 01/21/20 at 6:36 PM by a phone application (initally attempted video, but no audio on his end after multiple attempts, reverted to phone) and verified that I am speaking with the correct person using two identifiers.   I discussed the limitations, risks, security and privacy concerns of performing an evaluation and management service by telephone and the availability of in person appointments. I also discussed with the patient that there may be a patient responsible charge related to this service. The patient expressed understanding and agreed to proceed, consent obtained.   Chief complaint: Chief Complaint  Patient presents with  . Follow-up    on diabetes. pt states hes hasvn't had any issues his BS has been staying below 200mg .dl and pt check his BS 2x daily. pt states meds seem to be working well with no side effects.    History of Present Illness: NARADA FREES is a 39 y.o. male  Diabetes: Complicated by hyperglycemia  See prior visits.  Uncontrolled with A1c of 14 on January 22, glucose 574 at that time. Initial plan for Lantus insulin, initially misunderstood dosing instructions - only minimal insulin had been used. In office teaching with insulin pen performed. added Metformin as well as restarted statin, ACE inhibitor. additionally was referred  to endocrinology.   At February 24 visit he was alternating 10 and 12 units every other night. Reported reading of 193 on 2/24 at home.  Clarified previous dosing increase plan but decided for ease of dosing to remain at 12 units per night, continued Metformin 500 mg twice daily.   Since last visit:  Home readings have remained below 200. Lowest 122. Felt ok. No symptomatic lows. Stomach upset on day of 122, but improved next day.  No diarrhea.  189 today fasting. No new side effects  with lisinopril or lipitor, no new myalgias or lightheadedness. Has not had appointment scheduled yet with endocrinology - has been calling, but not yet scheduled.   Lab Results  Component Value Date   HGBA1C 14.0 (A) 12/12/2019   HGBA1C 12.5 12/11/2017   HGBA1C 7.8 09/10/2017   Lab Results  Component Value Date   MICROALBUR 0.8 06/22/2016   LDLCALC 129 (H) 12/12/2019   CREATININE 1.05 12/12/2019    Right-sided hearing loss Treated by ear nose and throat, Dr. Lucia Gaskins, office visit today.  Sensorineural hearing loss.  Initially treated with prednisone taper from 60 mg initially starting February 17, with hyperglycemia precautions.  Per review of note today, not much improvement after completing steroid taper.  Repeat audiogram demonstrated right sensorineural hearing loss of approximately 70 to 80 dB.  Plan for MRI to evaluate for cochlear or retrocochlear pathology, and plan to refer to audiology concerning amplification options.   Left side has been hearing ok. No questions regarding above plan.    Patient Active Problem List   Diagnosis Date Noted  . UARS (upper airway resistance syndrome) 04/07/2015  . Severe obesity (BMI >= 40) ([place]) 04/07/2015  . Primary snoring 04/07/2015  . Snoring 12/21/2014  . Drowsiness 12/21/2014  . Witnessed apneic spells 12/21/2014  . ED (erectile dysfunction) 09/24/2012  . Diabetes mellitus type 2 in obese ([place]) 07/04/2009    Class: Chronic  . OBESITY, NOS 01/17/2007  . BELLS PALSY 01/17/2007  . HYPERTENSION, BENIGN SYSTEMIC 01/17/2007   Past  Medical History:  Diagnosis Date  . Cataract 2011   surgery on R eye  . Diabetes mellitus without complication ([place])   . High cholesterol   . Obesity    Past Surgical History:  Procedure Laterality Date  . EYE SURGERY Left 2011   cataract  . EYE SURGERY Right 2012   No Known Allergies Prior to Admission medications   Medication Sig Start Date End Date Taking? Authorizing Provider  atorvastatin  (LIPITOR) 10 MG tablet Take 1 tablet (10 mg total) by mouth daily. 12/12/19  Yes Wendie Agreste, MD  Insulin Glargine (LANTUS SOLOSTAR) 100 UNIT/ML Solostar Pen Inject 10 Units into the skin every morning. And pen needles 1/day 12/12/19  Yes Wendie Agreste, MD  lisinopril (ZESTRIL) 5 MG tablet Take 1 tablet (5 mg total) by mouth daily. 12/12/19  Yes Wendie Agreste, MD  metFORMIN (GLUCOPHAGE) 500 MG tablet Take 1 tablet (500 mg total) by mouth 2 (two) times daily with a meal. 01/02/20  Yes Wendie Agreste, MD  fluticasone [facility]) 50 MCG/ACT nasal spray Place 1 spray into both nostrils daily. Patient not taking: Reported on 01/21/2020 12/08/19   Augusto Gamble B, NP  ibuprofen (ADVIL) 600 MG tablet Take 1 tablet (600 mg total) by mouth every 6 (six) hours as needed. Patient not taking: Reported on 01/21/2020 10/06/19   Debara Pickett C, PA-C  bromocriptine (PARLODEL) 2.5 MG tablet 1/4 tab daily Patient not taking: Reported on 03/12/2019 01/11/18 10/06/19  Renato Shin, MD  glimepiride (AMARYL) 1 MG tablet Take 0.5 tablets (0.5 mg total) by mouth daily with breakfast. Patient not taking: Reported on 03/12/2019 01/11/18 10/06/19  Renato Shin, MD  sitaGLIPtin (JANUVIA) 100 MG tablet TAKE 1 TABLET (100 MG TOTAL) BY MOUTH DAILY. Patient not taking: Reported on 03/12/2019 01/11/18 10/06/19  Renato Shin, MD   Social History   Socioeconomic History  . Marital status: Married    Spouse name: Not on file  . Number of children: 1  . Years of education: 12+  . Highest education level: Not on file  Occupational History    Employer: [employer]  Tobacco Use  . Smoking status: Never Smoker  . Smokeless tobacco: Never Used  Substance and Sexual Activity  . Alcohol use: Not Currently    Alcohol/week: 0.0 standard drinks    Comment: 1 or 2 every 3 months   . Drug use: No  . Sexual activity: Not on file  Other Topics Concern  . Not on file  Social History Narrative   Lives at home with wife and  stepson.   Caffeine use: none   Has one child.   Social Determinants of Health   Financial Resource Strain:   . Difficulty of Paying Living Expenses: Not on file  Food Insecurity:   . Worried About Charity fundraiser in the Last Year: Not on file  . Ran Out of Food in the Last Year: Not on file  Transportation Needs:   . Lack of Transportation (Medical): Not on file  . Lack of Transportation (Non-Medical): Not on file  Physical Activity:   . Days of Exercise per Week: Not on file  . Minutes of Exercise per Session: Not on file  Stress:   . Feeling of Stress : Not on file  Social Connections:   . Frequency of Communication with Friends and Family: Not on file  . Frequency of Social Gatherings with Friends and Family: Not on file  . Attends Religious Services: Not  on file  . Active Member of Clubs or Organizations: Not on file  . Attends Archivist Meetings: Not on file  . Marital Status: Not on file  Intimate Partner Violence:   . Fear of Current or Ex-Partner: Not on file  . Emotionally Abused: Not on file  . Physically Abused: Not on file  . Sexually Abused: Not on file    Observations/Objective: Vitals:   01/21/20 1313  Weight: 254 lb (115.2 kg)  Height: 5\' 10"  (1.778 m)  no distress on phone, appropriate responses, understanding expressed of plan with all questions answered.  Assessment and Plan: Type 2 diabetes mellitus with hyperglycemia, with long-term current use of insulin ([facility])  -improving control with much improved readings at home, now off prednisone,  tolerating Lantus 12 units/day, Metformin 500 mg twice daily.  Denies true symptomatic hypoglycemia.   - Some elevated readings in the high 100s, but with the level of 122 will continue same regimen for now with repeat evaluation in 2 weeks.  In office for possible fructosamine and point-of-care glucose.   - Endocrinology referral pending but he is trying to coordinate appointment with their office.  If  he has appt with  endocrinology in 2 weeks, can follow-up with them, cancel our appointment.  Also advised to let me know if he is having difficulty scheduling that appointment and we can have our referral staff help to coordinate.   Sensorineural hearing loss (SNHL) of right ear with unrestricted hearing of left ear   -Unfortunately did not have significant improvement with prednisone treatment.  Followed by ear nose and throat with plan for MRI to evaluate cochlear/retrocochlear cause, and audiology for amplification options.  Continue follow-up with ENT.  Follow Up Instructions:   2 weeks.  I discussed the assessment and treatment plan with the patient. The patient was provided an opportunity to ask questions and all were answered. The patient agreed with the plan and demonstrated an understanding of the instructions.   The patient was advised to call back or seek an in-person evaluation if the symptoms worsen or if the condition fails to improve as anticipated.  I provided 12 minutes of non-face-to-face time during this encounter.   Wendie Agreste, MD

## 2020-01-21 NOTE — Progress Notes (Signed)
HPI: Ian Hughes is a 39 y.o. male who returns today for evaluation of sudden right ear SNHL.  He has not really noted much improvement after completing the steroid taper over the last 2 weeks. Repeat audiogram today again demonstrated right ear sensorineural hearing loss of approximately 70-80 dB..  Past Medical History:  Diagnosis Date  . Cataract 2011   surgery on R eye  . Diabetes mellitus without complication ([place])   . High cholesterol   . Obesity    Past Surgical History:  Procedure Laterality Date  . EYE SURGERY Left 2011   cataract  . EYE SURGERY Right 2012   Social History   Socioeconomic History  . Marital status: Married    Spouse name: Not on file  . Number of children: 1  . Years of education: 12+  . Highest education level: Not on file  Occupational History    Employer: [employer]  Tobacco Use  . Smoking status: Never Smoker  . Smokeless tobacco: Never Used  Substance and Sexual Activity  . Alcohol use: Not Currently    Alcohol/week: 0.0 standard drinks    Comment: 1 or 2 every 3 months   . Drug use: No  . Sexual activity: Not on file  Other Topics Concern  . Not on file  Social History Narrative   Lives at home with wife and stepson.   Caffeine use: none   Has one child.   Social Determinants of Health   Financial Resource Strain:   . Difficulty of Paying Living Expenses: Not on file  Food Insecurity:   . Worried About Charity fundraiser in the Last Year: Not on file  . Ran Out of Food in the Last Year: Not on file  Transportation Needs:   . Lack of Transportation (Medical): Not on file  . Lack of Transportation (Non-Medical): Not on file  Physical Activity:   . Days of Exercise per Week: Not on file  . Minutes of Exercise per Session: Not on file  Stress:   . Feeling of Stress : Not on file  Social Connections:   . Frequency of Communication with Friends and Family: Not on file  . Frequency of Social Gatherings with Friends and Family:  Not on file  . Attends Religious Services: Not on file  . Active Member of Clubs or Organizations: Not on file  . Attends Archivist Meetings: Not on file  . Marital Status: Not on file   Family History  Problem Relation Age of Onset  . Diabetes Mother   . Cancer Father        leukemia   . Obesity Other    No Known Allergies Prior to Admission medications   Medication Sig Start Date End Date Taking? Authorizing Provider  atorvastatin (LIPITOR) 10 MG tablet Take 1 tablet (10 mg total) by mouth daily. 12/12/19  Yes Wendie Agreste, MD  fluticasone (FLONASE) 50 MCG/ACT nasal spray Place 1 spray into both nostrils daily. 12/08/19  Yes Burky, Lanelle Bal B, NP  ibuprofen (ADVIL) 600 MG tablet Take 1 tablet (600 mg total) by mouth every 6 (six) hours as needed. 10/06/19  Yes Wieters, Hallie C, PA-C  Insulin Glargine (LANTUS SOLOSTAR) 100 UNIT/ML Solostar Pen Inject 10 Units into the skin every morning. And pen needles 1/day 12/12/19  Yes Wendie Agreste, MD  lisinopril (ZESTRIL) 5 MG tablet Take 1 tablet (5 mg total) by mouth daily. 12/12/19  Yes Wendie Agreste, MD  metFORMIN (  GLUCOPHAGE) 500 MG tablet Take 1 tablet (500 mg total) by mouth 2 (two) times daily with a meal. 01/02/20  Yes Wendie Agreste, MD  bromocriptine (PARLODEL) 2.5 MG tablet 1/4 tab daily Patient not taking: Reported on 03/12/2019 01/11/18 10/06/19  Renato Shin, MD  glimepiride (AMARYL) 1 MG tablet Take 0.5 tablets (0.5 mg total) by mouth daily with breakfast. Patient not taking: Reported on 03/12/2019 01/11/18 10/06/19  Renato Shin, MD  sitaGLIPtin (JANUVIA) 100 MG tablet TAKE 1 TABLET (100 MG TOTAL) BY MOUTH DAILY. Patient not taking: Reported on 03/12/2019 01/11/18 10/06/19  Renato Shin, MD     Positive ROS: Otherwise negative.  All other systems have been reviewed and were otherwise negative with the exception of those mentioned in the HPI and as above.  Physical Exam: Constitutional: Alert,  well-appearing, no acute distress Ears: External ears without lesions or tenderness. Ear canals are clear bilaterally with intact, clear TMs bilaterally. Nasal: External nose without lesions. Clear nasal passages Oral: Lips and gums without lesions. Tongue and palate mucosa without lesions. Posterior oropharynx clear. Neck: No palpable adenopathy or masses Respiratory: Breathing comfortably  Skin: No facial/neck lesions or rash noted.  Procedures  Assessment: Right ear sudden sensorineural hearing loss that initially began in mid January.  Plan: We will plan on scheduling an MRI scan to evaluate for cochlear or retrocochlear pathology.  He will call us following the results of the MRI scan. We will plan on referring patient to audiology concerning amplification options.   Radene Journey, MD

## 2020-01-21 NOTE — Patient Instructions (Addendum)
° ° ° °  If you have lab work done today you will be contacted with your lab results within the next 2 weeks.  If you have not heard from us then please contact us. The fastest way to get your results is to register for My Chart. ° ° °IF you received an x-ray today, you will receive an invoice from [facility]. Please contact [facility] at [phone] with questions or concerns regarding your invoice.  ° °IF you received labwork today, you will receive an invoice from [facility]. Please contact [facility] at [phone] with questions or concerns regarding your invoice.  ° °Our billing staff will not be able to assist you with questions regarding bills from these companies. ° °You will be contacted with the lab results as soon as they are available. The fastest way to get your results is to activate your My Chart account. Instructions are located on the last page of this paperwork. If you have not heard from us regarding the results in 2 weeks, please contact this office. °  ° ° ° °

## 2020-01-22 ENCOUNTER — Other Ambulatory Visit (INDEPENDENT_AMBULATORY_CARE_PROVIDER_SITE_OTHER): Payer: Self-pay

## 2020-01-22 DIAGNOSIS — H9041 Sensorineural hearing loss, unilateral, right ear, with unrestricted hearing on the contralateral side: Secondary | ICD-10-CM

## 2020-01-23 ENCOUNTER — Encounter (INDEPENDENT_AMBULATORY_CARE_PROVIDER_SITE_OTHER): Payer: Self-pay

## 2020-01-27 ENCOUNTER — Encounter: Payer: Self-pay | Admitting: Family Medicine

## 2020-01-27 NOTE — Telephone Encounter (Signed)
Pt called yesterday with increased blood sugar, told by pcp if he was to see spikes contact office. Pls ask pcp if he wants patient to increase insulin units by 2. Says this has already been discussed by he and his pcp, yet told to contact first. Blood sugar is usually 200 in the mornings, pt had not eaten during the time of increased blood sugar.

## 2020-01-30 ENCOUNTER — Ambulatory Visit: Payer: BC Managed Care – PPO | Admitting: Family Medicine

## 2020-02-04 ENCOUNTER — Encounter: Payer: Self-pay | Admitting: Family Medicine

## 2020-02-04 ENCOUNTER — Ambulatory Visit (INDEPENDENT_AMBULATORY_CARE_PROVIDER_SITE_OTHER): Payer: BC Managed Care – PPO | Admitting: Family Medicine

## 2020-02-04 ENCOUNTER — Other Ambulatory Visit: Payer: Self-pay

## 2020-02-04 VITALS — BP 134/83 | HR 93 | Temp 97.6°F | Resp 15 | Ht 70.0 in | Wt 254.4 lb

## 2020-02-04 DIAGNOSIS — E1165 Type 2 diabetes mellitus with hyperglycemia: Secondary | ICD-10-CM

## 2020-02-04 DIAGNOSIS — E1136 Type 2 diabetes mellitus with diabetic cataract: Secondary | ICD-10-CM

## 2020-02-04 DIAGNOSIS — G4733 Obstructive sleep apnea (adult) (pediatric): Secondary | ICD-10-CM | POA: Diagnosis not present

## 2020-02-04 DIAGNOSIS — R519 Headache, unspecified: Secondary | ICD-10-CM

## 2020-02-04 DIAGNOSIS — IMO0002 Reserved for concepts with insufficient information to code with codable children: Secondary | ICD-10-CM

## 2020-02-04 LAB — GLUCOSE, POCT (MANUAL RESULT ENTRY): POC Glucose: 369 mg/dl — AB (ref 70–99)

## 2020-02-04 MED ORDER — SITAGLIPTIN PHOSPHATE 100 MG PO TABS: ORAL_TABLET | ORAL | 1 refills | Status: DC

## 2020-02-04 NOTE — Progress Notes (Signed)
Subjective:  Patient ID: Ian Hughes, male    DOB: 12/24/1980  Age: 39 y.o. MRN: [id number]  CC:  Chief Complaint  Patient presents with  . Diabetes    pt noticed a diffrence after ending steroid it went down under 200 and is now back up.     HPI Ian Hughes presents for   Diabetes: Complicated by hyperglycemia, difficulty with understanding of insulin dosing initially.  Last discussed 01/21/20. Improving. Continued lantus 12 units per day, metformin 531m BID. No lows, but had readings in 100's, 122. Endocrinology referral pending.  Had some elevated readings after last visit - 270, remaining over 200. No lows, no 200. 347 once last week. 302 last Saturday. 200's this week.  On 14 units lantus per day for past 8 days.   Fasting 249 this morning.  Has not called endocrinology back to schedule appt. Will provide him number again today.   History of sensorineural hearing loss Has appt with [place] [facility] on 4/7 for hearing loss, then planned follow up with audiology after imaging. Slight increase in hearing at last vist, but not by much - 15db. Hearing ok for work. Denies new mood symptoms.   Occasional R sided HA - comes and goes. Has been there for awhile - past few weeks.  Not daily, has had episodes where he goes to bed with headache and wakes up with headache.  Did miss work last week one day due to HA. No N/V, no aura, no vision changes, no weakness. Snoring, daytime sleepiness, waking up throughout the night 2 times. Diagnosed with OSA in past - notes from 2016. Never returned for CPAP titration.     Lab Results  Component Value Date   HGBA1C 14.0 (A) 12/12/2019   HGBA1C 12.5 12/11/2017   HGBA1C 7.8 09/10/2017   Lab Results  Component Value Date   MICROALBUR 0.8 06/22/2016   LDLCALC 129 (H) 12/12/2019   CREATININE 1.05 12/12/2019    History Patient Active Problem List   Diagnosis Date Noted  . UARS (upper airway resistance syndrome) 04/07/2015  . Severe [place]  (BMI >= 40) [place] 04/07/2015  . Primary snoring 04/07/2015  . Snoring 12/21/2014  . Drowsiness 12/21/2014  . Witnessed apneic spells 12/21/2014  . ED (erectile dysfunction) 09/24/2012  . Diabetes mellitus type 2 in obese ([place] 07/04/2009    Class: Chronic  . OBESITY, NOS 01/17/2007  . BELLS PALSY 01/17/2007  . HYPERTENSION, BENIGN SYSTEMIC 01/17/2007   Past Medical History:  Diagnosis Date  . Cataract 2011   surgery on R eye  . [place] without complication [place]   . High cholesterol   . Obesity    Past Surgical History:  Procedure Laterality Date  . EYE SURGERY Left 2011   cataract  . EYE SURGERY Right 2012   No Known Allergies Prior to Admission medications   Medication Sig Start Date End Date Taking? Authorizing Provider  atorvastatin (LIPITOR) 10 MG tablet Take 1 tablet (10 mg total) by mouth daily. 12/12/19  Yes GWendie Agreste MD  fluticasone (FLONASE) 50 MCG/ACT nasal spray Place 1 spray into both nostrils daily. 12/08/19  Yes Burky, NLanelle BalB, NP  ibuprofen (ADVIL) 600 MG tablet Take 1 tablet (600 mg total) by mouth every 6 (six) hours as needed. 10/06/19  Yes Wieters, Hallie C, PA-C  Insulin Glargine (LANTUS SOLOSTAR) 100 UNIT/ML Solostar Pen Inject 10 Units into the skin every morning. And pen needles 1/day 12/12/19  Yes GWendie Agreste MD  lisinopril (ZESTRIL) 5 MG tablet Take 1 tablet (5 mg total) by mouth daily. 12/12/19  Yes Wendie Agreste, MD  metFORMIN (GLUCOPHAGE) 500 MG tablet Take 1 tablet (500 mg total) by mouth 2 (two) times daily with a meal. 01/02/20  Yes Wendie Agreste, MD  bromocriptine (PARLODEL) 2.5 MG tablet 1/4 tab daily Patient not taking: Reported on 03/12/2019 01/11/18 10/06/19  Renato Shin, MD  glimepiride (AMARYL) 1 MG tablet Take 0.5 tablets (0.5 mg total) by mouth daily with breakfast. Patient not taking: Reported on 03/12/2019 01/11/18 10/06/19  Renato Shin, MD  sitaGLIPtin (JANUVIA) 100 MG tablet TAKE 1 TABLET (100 MG TOTAL)  BY MOUTH DAILY. Patient not taking: Reported on 03/12/2019 01/11/18 10/06/19  Renato Shin, MD   Social History   Socioeconomic History  . Marital status: Married    Spouse name: Not on file  . Number of children: 1  . Years of education: 12+  . Highest education level: Not on file  Occupational History    Employer: [employer]  Tobacco Use  . Smoking status: Never Smoker  . Smokeless tobacco: Never Used  Substance and Sexual Activity  . Alcohol use: Not Currently    Alcohol/week: 0.0 standard drinks    Comment: 1 or 2 every 3 months   . Drug use: No  . Sexual activity: Not on file  Other Topics Concern  . Not on file  Social History Narrative   Lives at home with wife and stepson.   Caffeine use: none   Has one child.   Social Determinants of Health   Financial Resource Strain:   . Difficulty of Paying Living Expenses:   Food Insecurity:   . Worried About Charity fundraiser in the Last Year:   . Arboriculturist in the Last Year:   Transportation Needs:   . Film/video editor (Medical):   Marland Kitchen Lack of Transportation (Non-Medical):   Physical Activity:   . Days of Exercise per Week:   . Minutes of Exercise per Session:   Stress:   . Feeling of Stress :   Social Connections:   . Frequency of Communication with Friends and Family:   . Frequency of Social Gatherings with Friends and Family:   . Attends Religious Services:   . Active Member of Clubs or Organizations:   . Attends Archivist Meetings:   Marland Kitchen Marital Status:   Intimate Partner Violence:   . Fear of Current or Ex-Partner:   . Emotionally Abused:   Marland Kitchen Physically Abused:   . Sexually Abused:     Review of Systems Per HPI.   Objective:   Vitals:   02/04/20 1140  BP: 134/83  Pulse: 93  Resp: 15  Temp: 97.6 F (36.4 C)  TempSrc: Temporal  SpO2: 98%  Weight: 254 lb 6.4 oz (115.4 kg)  Height: '5\' 10"'  (1.778 m)     Physical Exam Vitals reviewed.  Constitutional:      Appearance: He  is well-developed.  HENT:     Head: Normocephalic and atraumatic.     Right Ear: External ear normal.     Left Ear: External ear normal.  Eyes:     Conjunctiva/sclera: Conjunctivae normal.     Pupils: Pupils are equal, round, and reactive to light.  Neck:     Thyroid: No thyromegaly.     Vascular: No carotid bruit or JVD.  Cardiovascular:     Rate and Rhythm: Normal rate and regular rhythm.  Heart sounds: Normal heart sounds. No murmur.  Pulmonary:     Effort: Pulmonary effort is normal. No respiratory distress.     Breath sounds: Normal breath sounds. No wheezing or rales.  Abdominal:     General: There is no distension.     Palpations: Abdomen is soft.     Tenderness: There is no abdominal tenderness.     Hernia: There is no hernia in the left inguinal area or right inguinal area.  Genitourinary:    Prostate: Normal.  Musculoskeletal:        General: No tenderness. Normal range of motion.     Cervical back: Normal range of motion and neck supple.  Lymphadenopathy:     Cervical: No cervical adenopathy.  Skin:    General: Skin is warm and dry.  Neurological:     Mental Status: He is alert and oriented to person, place, and time.     Deep Tendon Reflexes: Reflexes are normal and symmetric.  Psychiatric:        Behavior: Behavior normal.     Assessment & Plan:  Ian Hughes is a 39 y.o. male . Nonintractable episodic headache, unspecified headache type - Plan: Ambulatory referral to Sleep Studies OSA (obstructive sleep apnea) - Plan: Ambulatory referral to Sleep Studies  -History of obstructive sleep apnea on prior testing 5 years ago.  Suspected sleep apnea contributor to headache, fatigue, snoring.  Referred back to sleep specialist for CPAP titration versus repeat testing.  -Follow-up as planned with ENT, and for neuro imaging as planned.  Nonfocal neurologic exam.  Handout given on headaches, ER/RTC precautions if acute changes.  Uncontrolled type 2 diabetes  mellitus with diabetic cataract, without long-term current use of insulin ([place]) - Plan: sitaGLIPtin (JANUVIA) 100 MG tablet, POCT glucose (manual entry)  -Phone number provided for endocrinology.  Increased readings as above, will adjust regimen with restart of Januvia, increase Lantus by 2 units, continue Metformin for now.  Hypoglycemia precautions given.  Meds ordered this encounter  Medications  . sitaGLIPtin (JANUVIA) 100 MG tablet    Sig: TAKE 1 TABLET (100 MG TOTAL) BY MOUTH DAILY.    Dispense:  90 tablet    Refill:  1   Patient Instructions   Please call Dr. Loanne Drilling  For appointment - [phone].  Continue Metformin same dose for now, restart Januvia.  Increase Lantus to 16 units/day.  If any low blood sugars at that level, return to 14 units.  See information below on headaches.  This certainly could be related to previous obstructive sleep apnea.  I will place another referral to meet with sleep specialist to discuss CPAP as that is likely affecting your sleep.  Keep follow-up for imaging as planned from ear nose and throat.Return to the clinic or go to the nearest emergency room if any of your symptoms worsen or new symptoms occur.   Recheck in 2 weeks if he has not yet met with endocrinology, let me know if there are questions sooner.    General Headache Without Cause A headache is pain or discomfort felt around the head or neck area. The specific cause of a headache may not be found. There are many causes and types of headaches. A few common ones are:  Tension headaches.  Migraine headaches.  Cluster headaches.  Chronic daily headaches. Follow these instructions at home: Watch your condition for any changes. Let your health care provider know about them. Take these steps to help with your condition: Managing pain  Take over-the-counter and prescription medicines only as told by your health care provider.  Lie down in a dark, quiet room when you have a  headache.  If directed, put ice on your head and neck area: ? Put ice in a plastic bag. ? Place a towel between your skin and the bag. ? Leave the ice on for 20 minutes, 2-3 times per day.  If directed, apply heat to the affected area. Use the heat source that your health care provider recommends, such as a moist heat pack or a heating pad. ? Place a towel between your skin and the heat source. ? Leave the heat on for 20-30 minutes. ? Remove the heat if your skin turns bright red. This is especially important if you are unable to feel pain, heat, or cold. You may have a greater risk of getting burned.  Keep lights dim if bright lights bother you or make your headaches worse. Eating and drinking  Eat meals on a regular schedule.  If you drink alcohol: ? Limit how much you use to:  0-1 drink a day for women.  0-2 drinks a day for men. ? Be aware of how much alcohol is in your drink. In the [place] one drink equals one 12 oz bottle of beer (355 mL), one 5 oz glass of wine (148 mL), or one 1 oz glass of hard liquor (44 mL).  Stop drinking caffeine, or decrease the amount of caffeine you drink. General instructions   Keep a headache journal to help find out what may trigger your headaches. For example, write down: ? What you eat and drink. ? How much sleep you get. ? Any change to your diet or medicines.  Try massage or other relaxation techniques.  Limit stress.  Sit up straight, and do not tense your muscles.  Do not use any products that contain nicotine or tobacco, such as cigarettes, e-cigarettes, and chewing tobacco. If you need help quitting, ask your health care provider.  Exercise regularly as told by your health care provider.  Sleep on a regular schedule. Get 7-9 hours of sleep each night, or the amount recommended by your health care provider.  Keep all follow-up visits as told by your health care provider. This is important. Contact a health care provider  if:  Your symptoms are not helped by medicine.  You have a headache that is different from the usual headache.  You have nausea or you vomit.  You have a fever. Get help right away if:  Your headache becomes severe quickly.  Your headache gets worse after moderate to intense physical activity.  You have repeated vomiting.  You have a stiff neck.  You have a loss of vision.  You have problems with speech.  You have pain in the eye or ear.  You have muscular weakness or loss of muscle control.  You lose your balance or have trouble walking.  You feel faint or pass out.  You have confusion.  You have a seizure. Summary  A headache is pain or discomfort felt around the head or neck area.  There are many causes and types of headaches. In some cases, the cause may not be found.  Keep a headache journal to help find out what may trigger your headaches. Watch your condition for any changes. Let your health care provider know about them.  Contact a health care provider if you have a headache that is different from the usual headache, or if your  symptoms are not helped by medicine.  Get help right away if your headache becomes severe, you vomit, you have a loss of vision, you lose your balance, or you have a seizure. This information is not intended to replace advice given to you by your health care provider. Make sure you discuss any questions you have with your health care provider. Document Revised: 05/27/2018 Document Reviewed: 05/27/2018 Elsevier Patient Education  [employer].   If you have lab work done today you will be contacted with your lab results within the next 2 weeks.  If you have not heard from [place] then please contact us. The fastest way to get your results is to register for My Chart.   IF you received an x-ray today, you will receive an invoice from [facility]. Please contact [facility] at [phone] with questions or concerns  regarding your invoice.   IF you received labwork today, you will receive an invoice from [place]. Please contact [facility] at [phone] with questions or concerns regarding your invoice.   Our billing staff will not be able to assist you with questions regarding bills from these companies.  You will be contacted with the lab results as soon as they are available. The fastest way to get your results is to activate your My Chart account. Instructions are located on the last page of this paperwork. If you have not heard from [place] regarding the results in 2 weeks, please contact this office.         Signed, Merri Ray, MD [facility]

## 2020-02-04 NOTE — Patient Instructions (Addendum)
Please call Dr. Loanne Drilling  For appointment - [phone].  Continue Metformin same dose for now, restart Januvia.  Increase Lantus to 16 units/day.  If any low blood sugars at that level, return to 14 units.  See information below on headaches.  This certainly could be related to previous obstructive sleep apnea.  I will place another referral to meet with sleep specialist to discuss CPAP as that is likely affecting your sleep.  Keep follow-up for imaging as planned from ear nose and throat.Return to the clinic or go to the nearest emergency room if any of your symptoms worsen or new symptoms occur.   Recheck in 2 weeks if he has not yet met with endocrinology, let me know if there are questions sooner.    General Headache Without Cause A headache is pain or discomfort felt around the head or neck area. The specific cause of a headache may not be found. There are many causes and types of headaches. A few common ones are:  Tension headaches.  Migraine headaches.  Cluster headaches.  Chronic daily headaches. Follow these instructions at home: Watch your condition for any changes. Let your health care provider know about them. Take these steps to help with your condition: Managing pain      Take over-the-counter and prescription medicines only as told by your health care provider.  Lie down in a dark, quiet room when you have a headache.  If directed, put ice on your head and neck area: ? Put ice in a plastic bag. ? Place a towel between your skin and the bag. ? Leave the ice on for 20 minutes, 2-3 times per day.  If directed, apply heat to the affected area. Use the heat source that your health care provider recommends, such as a moist heat pack or a heating pad. ? Place a towel between your skin and the heat source. ? Leave the heat on for 20-30 minutes. ? Remove the heat if your skin turns bright red. This is especially important if you are unable to feel pain, heat, or cold. You may  have a greater risk of getting burned.  Keep lights dim if bright lights bother you or make your headaches worse. Eating and drinking  Eat meals on a regular schedule.  If you drink alcohol: ? Limit how much you use to:  0-1 drink a day for women.  0-2 drinks a day for men. ? Be aware of how much alcohol is in your drink. In the [place] one drink equals one 12 oz bottle of beer (355 mL), one 5 oz glass of wine (148 mL), or one 1 oz glass of hard liquor (44 mL).  Stop drinking caffeine, or decrease the amount of caffeine you drink. General instructions   Keep a headache journal to help find out what may trigger your headaches. For example, write down: ? What you eat and drink. ? How much sleep you get. ? Any change to your diet or medicines.  Try massage or other relaxation techniques.  Limit stress.  Sit up straight, and do not tense your muscles.  Do not use any products that contain nicotine or tobacco, such as cigarettes, e-cigarettes, and chewing tobacco. If you need help quitting, ask your health care provider.  Exercise regularly as told by your health care provider.  Sleep on a regular schedule. Get 7-9 hours of sleep each night, or the amount recommended by your health care provider.  Keep all follow-up visits as told  by your health care provider. This is important. Contact a health care provider if:  Your symptoms are not helped by medicine.  You have a headache that is different from the usual headache.  You have nausea or you vomit.  You have a fever. Get help right away if:  Your headache becomes severe quickly.  Your headache gets worse after moderate to intense physical activity.  You have repeated vomiting.  You have a stiff neck.  You have a loss of vision.  You have problems with speech.  You have pain in the eye or ear.  You have muscular weakness or loss of muscle control.  You lose your balance or have trouble walking.  You feel  faint or pass out.  You have confusion.  You have a seizure. Summary  A headache is pain or discomfort felt around the head or neck area.  There are many causes and types of headaches. In some cases, the cause may not be found.  Keep a headache journal to help find out what may trigger your headaches. Watch your condition for any changes. Let your health care provider know about them.  Contact a health care provider if you have a headache that is different from the usual headache, or if your symptoms are not helped by medicine.  Get help right away if your headache becomes severe, you vomit, you have a loss of vision, you lose your balance, or you have a seizure. This information is not intended to replace advice given to you by your health care provider. Make sure you discuss any questions you have with your health care provider. Document Revised: 05/27/2018 Document Reviewed: 05/27/2018 Elsevier Patient Education  [employer].   If you have lab work done today you will be contacted with your lab results within the next 2 weeks.  If you have not heard from [place] then please contact us. The fastest way to get your results is to register for My Chart.   IF you received an x-ray today, you will receive an invoice from [facility]. Please contact [facility] at [phone] with questions or concerns regarding your invoice.   IF you received labwork today, you will receive an invoice from [place]. Please contact [facility] at [phone] with questions or concerns regarding your invoice.   Our billing staff will not be able to assist you with questions regarding bills from these companies.  You will be contacted with the lab results as soon as they are available. The fastest way to get your results is to activate your My Chart account. Instructions are located on the last page of this paperwork. If you have not heard from [place] regarding the results in 2 weeks, please  contact this office.

## 2020-02-07 ENCOUNTER — Encounter: Payer: Self-pay | Admitting: Family Medicine

## 2020-02-11 ENCOUNTER — Ambulatory Visit (INDEPENDENT_AMBULATORY_CARE_PROVIDER_SITE_OTHER): Payer: BC Managed Care – PPO | Admitting: Neurology

## 2020-02-11 ENCOUNTER — Encounter: Payer: Self-pay | Admitting: Family Medicine

## 2020-02-11 ENCOUNTER — Other Ambulatory Visit: Payer: Self-pay

## 2020-02-11 ENCOUNTER — Telehealth: Payer: Self-pay | Admitting: Family Medicine

## 2020-02-11 ENCOUNTER — Encounter: Payer: Self-pay | Admitting: Neurology

## 2020-02-11 ENCOUNTER — Ambulatory Visit
Admission: RE | Admit: 2020-02-11 | Discharge: 2020-02-11 | Disposition: A | Discharge status: Home / Self Care | Payer: BC Managed Care – PPO | Source: Ambulatory Visit | Attending: Otolaryngology | Admitting: Otolaryngology

## 2020-02-11 VITALS — BP 130/88 | HR 96 | Temp 96.8°F | Ht 70.0 in | Wt 251.0 lb

## 2020-02-11 DIAGNOSIS — R351 Nocturia: Secondary | ICD-10-CM | POA: Insufficient documentation

## 2020-02-11 DIAGNOSIS — H9041 Sensorineural hearing loss, unilateral, right ear, with unrestricted hearing on the contralateral side: Secondary | ICD-10-CM

## 2020-02-11 DIAGNOSIS — E1100 Type 2 diabetes mellitus with hyperosmolarity without nonketotic hyperglycemic-hyperosmolar coma (NKHHC): Secondary | ICD-10-CM

## 2020-02-11 DIAGNOSIS — I10 Essential (primary) hypertension: Secondary | ICD-10-CM

## 2020-02-11 DIAGNOSIS — R81 Glycosuria: Secondary | ICD-10-CM | POA: Diagnosis not present

## 2020-02-11 DIAGNOSIS — IMO0002 Reserved for concepts with insufficient information to code with codable children: Secondary | ICD-10-CM

## 2020-02-11 DIAGNOSIS — E1136 Type 2 diabetes mellitus with diabetic cataract: Secondary | ICD-10-CM

## 2020-02-11 DIAGNOSIS — F418 Other specified anxiety disorders: Secondary | ICD-10-CM

## 2020-02-11 DIAGNOSIS — Z9189 Other specified personal risk factors, not elsewhere classified: Secondary | ICD-10-CM

## 2020-02-11 DIAGNOSIS — E669 Obesity, unspecified: Secondary | ICD-10-CM

## 2020-02-11 DIAGNOSIS — G478 Other sleep disorders: Secondary | ICD-10-CM | POA: Insufficient documentation

## 2020-02-11 DIAGNOSIS — R519 Headache, unspecified: Secondary | ICD-10-CM

## 2020-02-11 DIAGNOSIS — Z794 Long term (current) use of insulin: Secondary | ICD-10-CM | POA: Insufficient documentation

## 2020-02-11 DIAGNOSIS — G4719 Other hypersomnia: Secondary | ICD-10-CM | POA: Insufficient documentation

## 2020-02-11 MED ORDER — PRODIGY NO CODING BLOOD GLUC VI STRP: ORAL_STRIP | 12 refills | Status: DC

## 2020-02-11 MED ORDER — LISINOPRIL 5 MG PO TABS: 5.0000 mg | ORAL_TABLET | Freq: Every day | ORAL | 3 refills | Status: DC

## 2020-02-11 MED ORDER — ATORVASTATIN CALCIUM 10 MG PO TABS: 10.0000 mg | ORAL_TABLET | Freq: Every day | ORAL | 3 refills | Status: DC

## 2020-02-11 MED ORDER — DIAZEPAM 2 MG PO TABS: ORAL_TABLET | ORAL | 0 refills | Status: DC

## 2020-02-11 MED ORDER — LANTUS SOLOSTAR 100 UNIT/ML ~~LOC~~ SOPN: 10.0000 [IU] | PEN_INJECTOR | SUBCUTANEOUS | 99 refills | Status: DC

## 2020-02-11 NOTE — Telephone Encounter (Signed)
What about the pen and test strips from original message ? Patient calling back asking about these. Pt was told by pharmaist that insurance would pay for test and pen tips .Marland Kitchen    Fraser Din is asking for this as well

## 2020-02-11 NOTE — Telephone Encounter (Signed)
Pt went to have an MRI and patient found out he is claustrophobic/ was not  Able to complete MRI . Can  He get a single dose of something for anxiety .

## 2020-02-11 NOTE — Telephone Encounter (Signed)
Patient went to get an MRI and found out he was Claustrophobic and didn't complete the MRI. Patient wants to know if there is a prescription he could get for Anxiety.  Please Advise,

## 2020-02-11 NOTE — Telephone Encounter (Signed)
Order placed pt informed

## 2020-02-11 NOTE — Telephone Encounter (Signed)
Will prescribe valium to take once prior to procedure.  Sent to his pharmacy. Please make sure that test is rescheduled. Thanks.

## 2020-02-11 NOTE — Progress Notes (Signed)
SLEEP MEDICINE CLINIC    Provider:  Larey Seat, MD  Primary Care Physician:  Wendie Agreste, MD [address] [place] [id number]     Referring Provider: Wendie Agreste, [place] [address] [place],  [place] [address]          Chief Complaint according to patient   Patient presents with:    . New Patient (Initial Visit)     rm 10. pt had a PSG in 2016 and was never started on CPAP. MD having him re evaluate since having headaches      HISTORY OF PRESENT ILLNESS:  Ian Hughes is a 39 year old African American male patient seen here upon referral on 02/11/2020 . Marland Kitchen  Chief concern according to patient : New onset headaches, had a mild OSA , more UARS, in 2016, then a patient of Dr. Jaynee Eagles.    Ian Hughes is a right -handed African American male with a possible sleep disorder.  He  has a past medical history of Cataract (2011), Diabetes mellitus without complication ([place]), High cholesterol, and Obesity.Marland Kitchen He developed uncontrolled diabetes. HBa1c was 14. He had gained weight on steroids. He had Covid in April 2020, myalgia, fatigue, diarrhea and chills. He lost 31 pounds in 18 days !    The patient had the first sleep study in the year 2016  with a result of an AHI ( Apnea Hypopnea index)  of 8.2/h , a RDI ( Respiratory Disturbance Index) of 13.7/h, an oxygen saturation Nadir at SP02 88%.    Sleep relevant medical history: Nocturia 2-5, every 2 hours.   Family medical /sleep history: mother on CPAP with OSA.    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 The patient currently works door to door. Has not been vaccinated as of now. Pets are  Present. dog Tobacco use; never   ETOH use; rarely. Caffeine intake in form of Coffee( none) Soda( some) Tea ( some) ,no energy drinks. Regular exercise in form of walking.      Sleep habits are as follows: The patient's dinner time is between 8 PM. The  patient goes to bed at 11 PM and has difficulties to go to sleep.  He continues to sleep for 1-2 hours, wakes for many,many bathroom breaks.The preferred sleep position is on the side, with the support of 2 pillows.   Dreams are reportedly rare.  8 AM is the usual rise time. The patient wakes up with an alarm.  Wakes up with headaches, right temple, feels retro-orbital.  He reports not feeling refreshed or restored in AM and has felt residual fatigue. Naps are taken infrequently.On weekends he may nap-1-2 hours in an afternoon.    Review of Systems: Out of a complete 14 system review, the patient complains of only the following symptoms, and all other reviewed systems are negative.:  Fatigue, sleepiness , snoring, fragmented sleep, Insomnia due to nocturia,     How likely are you to doze in the following situations: 0 = not likely, 1 = slight chance, 2 = moderate chance, 3 = high chance   Sitting and Reading? Watching Television? Sitting inactive in a public place (theater or meeting)? As a passenger in a car for an hour without a break? Lying down in the afternoon when circumstances permit? Sitting and talking to someone? Sitting quietly after lunch without alcohol? In a car,  while stopped for a few minutes in traffic?   Total = 13/ 24 points   FSS endorsed at 41/ 63 points.   Social History   Socioeconomic History  . Marital status: Married    Spouse name: Not on file  . Number of children: 1  . Years of education: 12+  . Highest education level: Not on file  Occupational History    Employer: [employer]  Tobacco Use  . Smoking status: Never Smoker  . Smokeless tobacco: Never Used  Substance and Sexual Activity  . Alcohol use: Not Currently    Alcohol/week: 0.0 standard drinks    Comment: 1 or 2 every 3 months   . Drug use: No  . Sexual activity: Not on file  Other Topics Concern  . Not on file  Social History Narrative   Lives at home with wife and stepson.    Caffeine use: none   Has one child.   Social Determinants of Health   Financial Resource Strain:   . Difficulty of Paying Living Expenses:   Food Insecurity:   . Worried About Charity fundraiser in the Last Year:   . Arboriculturist in the Last Year:   Transportation Needs:   . Film/video editor (Medical):   Marland Kitchen Lack of Transportation (Non-Medical):   Physical Activity:   . Days of Exercise per Week:   . Minutes of Exercise per Session:   Stress:   . Feeling of Stress :   Social Connections:   . Frequency of Communication with Friends and Family:   . Frequency of Social Gatherings with Friends and Family:   . Attends Religious Services:   . Active Member of Clubs or Organizations:   . Attends Archivist Meetings:   Marland Kitchen Marital Status:     Family History  Problem Relation Age of Onset  . Diabetes Mother   . Cancer Father        leukemia   . Obesity Other     Past Medical History:  Diagnosis Date  . Cataract 2011   surgery on R eye  . Diabetes mellitus without complication ([place])   . High cholesterol   . Obesity     Past Surgical History:  Procedure Laterality Date  . EYE SURGERY Left 2011   cataract  . EYE SURGERY Right 2012     Current Outpatient Medications on File Prior to Visit  Medication Sig Dispense Refill  . atorvastatin (LIPITOR) 10 MG tablet Take 1 tablet (10 mg total) by mouth daily. 90 tablet 3  . fluticasone (FLONASE) 50 MCG/ACT nasal spray Place 1 spray into both nostrils daily. 16 g 2  . ibuprofen (ADVIL) 600 MG tablet Take 1 tablet (600 mg total) by mouth every 6 (six) hours as needed. 30 tablet 0  . Insulin Glargine (LANTUS SOLOSTAR) 100 UNIT/ML Solostar Pen Inject 10 Units into the skin every morning. And pen needles 1/day 5 pen PRN  . lisinopril (ZESTRIL) 5 MG tablet Take 1 tablet (5 mg total) by mouth daily. 90 tablet 3  . metFORMIN (GLUCOPHAGE) 500 MG tablet Take 1 tablet (500 mg total) by mouth 2 (two) times daily with a meal.  180 tablet 1  . sitaGLIPtin (JANUVIA) 100 MG tablet TAKE 1 TABLET (100 MG TOTAL) BY MOUTH DAILY. 90 tablet 1  . [DISCONTINUED] bromocriptine (PARLODEL) 2.5 MG tablet 1/4 tab daily (Patient not taking: Reported on 03/12/2019) 25 tablet 3  . [DISCONTINUED] glimepiride (AMARYL) 1 MG  tablet Take 0.5 tablets (0.5 mg total) by mouth daily with breakfast. (Patient not taking: Reported on 03/12/2019) 45 tablet 3   No current facility-administered medications on file prior to visit.    No Known Allergies  Physical exam:  Today's Vitals   02/11/20 0844  BP: 130/88  Pulse: 96  Temp: (!) 96.8 F (36 C)  Weight: 251 lb (113.9 kg)  Height: 5\' 10"  (1.778 m)   Body mass index is 36.01 kg/m.   Wt Readings from Last 3 Encounters:  02/11/20 251 lb (113.9 kg)  02/04/20 254 lb 6.4 oz (115.4 kg)  01/21/20 254 lb (115.2 kg)     Ht Readings from Last 3 Encounters:  02/11/20 5\' 10"  (1.778 m)  02/04/20 5\' 10"  (1.778 m)  01/21/20 5\' 10"  (1.778 m)      General: The patient is awake, alert and appears not in acute distress. The patient is well groomed. Head: Normocephalic, atraumatic. Neck is supple. Mallampati 4,  neck circumference:18 inches . Nasal airflow patent.  Retrognathia is present.  Dental status:  Cardiovascular:  Regular rate and cardiac rhythm by pulse,  without distended neck veins. Respiratory: Lungs are clear to auscultation.  Skin:  Without evidence of ankle edema, or rash. Trunk: The patient's posture is erect.   Neurologic exam : The patient is awake and alert, oriented to place and time.   Memory subjective described as intact.  Attention span & concentration ability appears normal.  Speech is fluent,  without  dysarthria, dysphonia or aphasia.  Mood and affect are appropriate.   Cranial nerves: no loss of smell or taste reported  Pupils are equal and briskly reactive to light. Funduscopic exam deferred. .  Extraocular movements in vertical and horizontal planes were  intact and without nystagmus. No Diplopia. Visual fields by finger perimetry are intact. Hearing was intact to soft voice and finger rubbing.   Facial sensation intact to fine touch. Facial motor strength is symmetric and tongue and uvula move midline.  Neck ROM : rotation, tilt and flexion extension were normal for age and shoulder shrug was symmetrical.    Motor exam:  Symmetric bulk, tone and ROM.   Normal tone without cog wheeling, symmetric grip strength . Sensory:  Fine touch, pinprick and vibration were tested  and  normal.  Proprioception tested in the upper extremities was normal.  Coordination: Rapid alternating movements in the fingers/hands were of normal speed.  The Finger-to-nose maneuver was intact without evidence of ataxia, dysmetria or tremor.  Gait and station: Patient could rise unassisted from a seated position, walked without assistive device.  Stance is of normal width/ base and the patient turned with 3 steps.  Toe and heel walk were deferred.  Deep tendon reflexes: in the  upper and lower extremities are symmetric and intact.  Babinski response was deferred.        After spending a total time of 40  minutes face to face and time for physical and neurologic examination, review of laboratory studies,  personal review of imaging studies, reports and results of other testing and review of referral information / records as far as provided in visit, I have established the following assessments:  Mr. Kuzara has developed diabetes type 2 several years ago and in February of this year in January his diabetes was poorly controlled.  He has followed with Dr. Cindee Lame.  His last HbA1c was very high as documented in his referral papers.  14 on 22 January.  He has  a history of upper airway resistance syndrome he does not feel that he snores more than he did 5 years ago and he has not been told that he stops breathing.  Primary snoring has been present, he has lost weight in  comparison to the time I saw him last when his BMI was over 40 at this time he weighs 250 pounds and his BMI is 36.  He had a Bell's palsy in 2008 which has completely recovered and he has a history of hypertension.  He is treated with Januvia, Amaryl, Metformin Lantus insulin he also takes Flonase as needed and is on Lipitor 10 mg.  He reports that post Covid he has recovered physical function he is not aware of shortness of breath his headaches have not been a result of the Covid infectios.  He did have myalgia for a while felt achy and sore and lost a lot of weight due to diarrhea and frequent bathroom breaks at night also his bathroom is not on the same floor of his bedroom.  I would like to evaluate his possibility for sleep apnea again this time I think they are sufficiently served by using a home sleep test and not necessarily an admission to the sleep lab.  His morning headaches speak for a possible hypoxemia at night and that should be evaluated and screened for in a home sleep test as well.  He has an MRI ordered for today/ [facility]. Per ENT for senso-neural hearing loss.      My Plan is to proceed with:  1) HST for apnea screening. hypoxemia screening. 2) improve hydration and DM control- nocturia can be related to hyperglycemia, glucosuria.  3) keep weight off.   I would like to thank Wendie Agreste, MD and Wendie Agreste, [address] [place],  [place] [address] for allowing me to meet with and to take care of this pleasant patient.   In short, Ian Hughes is presenting with snoring and morning headaches, Nocturia , I plan to follow up either personally or through our NP within 2-3 month.   CC: I will share my notes with PCP and endocrinologist   Electronically signed by: Larey Seat, MD 02/11/2020 9:09 AM  [facility] Board certified by The [employer] of Sleep Medicine and Diplomate of the [employer] of Sleep  Medicine. Board certified In Neurology through the [place], Fellow of the [employer] of Neurology. Medical Director of [facility].

## 2020-02-11 NOTE — Addendum Note (Signed)
Addended by: Merri Ray R on: 02/11/2020 08:13 PM   Modules accepted: Orders

## 2020-02-11 NOTE — Patient Instructions (Signed)
Screening for Sleep Apnea  Sleep apnea is a condition in which breathing pauses or becomes shallow during sleep. Sleep apnea screening is a test to determine if you are at risk for sleep apnea. The test is easy and only takes a few minutes. Your health care provider may ask you to have this test in preparation for surgery or as part of a physical exam. What are the symptoms of sleep apnea? Common symptoms of sleep apnea include:  Snoring.  Restless sleep.  Daytime sleepiness.  Pauses in breathing.  Choking during sleep.  Irritability.  Forgetfulness.  Trouble thinking clearly.  Depression.  Personality changes. Most people with sleep apnea are not aware that they have it. Why should I get screened? Getting screened for sleep apnea can help:  Ensure your safety. It is important for your health care providers to know whether or not you have sleep apnea, especially if you are having surgery or have other long-term (chronic) health conditions.  Improve your health and allow you to get a better night's rest. Restful sleep can help you: ? Have more energy. ? Lose weight. ? Improve high blood pressure. ? Improve diabetes management. ? Prevent stroke. ? Prevent car accidents. How is screening done? Screening usually includes being asked a list of questions about your sleep quality. Some questions you may be asked include:  Do you snore?  Is your sleep restless?  Do you have daytime sleepiness?  Has a partner or spouse told you that you stop breathing during sleep?  Have you had trouble concentrating or memory loss? If your screening test is positive, you are at risk for the condition. Further testing may be needed to confirm a diagnosis of sleep apnea. Where to find more information You can find screening tools online or at your health care clinic. For more information about sleep apnea screening and healthy sleep, visit these websites:  Centers for Disease Control and  Prevention: www.cdc.gov/sleep/index.html  American Sleep Apnea Association: www.sleepapnea.org Contact a health care provider if:  You think that you may have sleep apnea. Summary  Sleep apnea screening can help determine if you are at risk for sleep apnea.  It is important for your health care providers to know whether or not you have sleep apnea, especially if you are having surgery or have other chronic health conditions.  You may be asked to take a screening test for sleep apnea in preparation for surgery or as part of a physical exam. This information is not intended to replace advice given to you by your health care provider. Make sure you discuss any questions you have with your health care provider. Document Revised: 08/23/2018 Document Reviewed: 02/16/2017 Elsevier Patient Education  2020 Elsevier Inc. Quality Sleep Information, Adult Quality sleep is important for your mental and physical health. It also improves your quality of life. Quality sleep means you:  Are asleep for most of the time you are in bed.  Fall asleep within 30 minutes.  Wake up no more than once a night.  Are awake for no longer than 20 minutes if you do wake up during the night. Most adults need 7-8 hours of quality sleep each night. How can poor sleep affect me? If you do not get enough quality sleep, you may have:  Mood swings.  Daytime sleepiness.  Confusion.  Decreased reaction time.  Sleep disorders, such as insomnia and sleep apnea.  Difficulty with: ? Solving problems. ? Coping with stress. ? Paying attention. These issues may   affect your performance and productivity at work, school, and at home. Lack of sleep may also put you at higher risk for accidents, suicide, and risky behaviors. If you do not get quality sleep you may also be at higher risk for several health problems, including:  Infections.  Type 2 diabetes.  Heart disease.  High blood  pressure.  Obesity.  Worsening of long-term conditions, like arthritis, kidney disease, depression, Parkinson's disease, and epilepsy. What actions can I take to get more quality sleep?      Stick to a sleep schedule. Go to sleep and wake up at about the same time each day. Do not try to sleep less on weekdays and make up for lost sleep on weekends. This does not work.  Try to get about 30 minutes of exercise on most days. Do not exercise 2-3 hours before going to bed.  Limit naps during the day to 30 minutes or less.  Do not use any products that contain nicotine or tobacco, such as cigarettes or e-cigarettes. If you need help quitting, ask your health care provider.  Do not drink caffeinated beverages for at least 8 hours before going to bed. Coffee, tea, and some sodas contain caffeine.  Do not drink alcohol close to bedtime.  Do not eat large meals close to bedtime.  Do not take naps in the late afternoon.  Try to get at least 30 minutes of sunlight every day. Morning sunlight is best.  Make time to relax before bed. Reading, listening to music, or taking a hot bath promotes quality sleep.  Make your bedroom a place that promotes quality sleep. Keep your bedroom dark, quiet, and at a comfortable room temperature. Make sure your bed is comfortable. Take out sleep distractions like TV, a computer, smartphone, and bright lights.  If you are lying awake in bed for longer than 20 minutes, get up and do a relaxing activity until you feel sleepy.  Work with your health care provider to treat medical conditions that may affect sleeping, such as: ? Nasal obstruction. ? Snoring. ? Sleep apnea and other sleep disorders.  Talk to your health care provider if you think any of your prescription medicines may cause you to have difficulty falling or staying asleep.  If you have sleep problems, talk with a sleep consultant. If you think you have a sleep disorder, talk with your health  care provider about getting evaluated by a specialist. Where to find more information  National Sleep Foundation website: https://sleepfoundation.org  National Heart, Lung, and Blood Institute (NHLBI): www.nhlbi.nih.gov/files/docs/public/sleep/healthy_sleep.pdf  Centers for Disease Control and Prevention (CDC): www.cdc.gov/sleep/index.html Contact a health care provider if you:  Have trouble getting to sleep or staying asleep.  Often wake up very early in the morning and cannot get back to sleep.  Have daytime sleepiness.  Have daytime sleep attacks of suddenly falling asleep and sudden muscle weakness (narcolepsy).  Have a tingling sensation in your legs with a strong urge to move your legs (restless legs syndrome).  Stop breathing briefly during sleep (sleep apnea).  Think you have a sleep disorder or are taking a medicine that is affecting your quality of sleep. Summary  Most adults need 7-8 hours of quality sleep each night.  Getting enough quality sleep is an important part of health and well-being.  Make your bedroom a place that promotes quality sleep and avoid things that may cause you to have poor sleep, such as alcohol, caffeine, smoking, and large meals.  Talk to   your health care provider if you have trouble falling asleep or staying asleep. This information is not intended to replace advice given to you by your health care provider. Make sure you discuss any questions you have with your health care provider. Document Revised: 02/13/2018 Document Reviewed: 02/13/2018 Elsevier Patient Education  2020 Elsevier Inc.  

## 2020-02-25 ENCOUNTER — Ambulatory Visit (INDEPENDENT_AMBULATORY_CARE_PROVIDER_SITE_OTHER): Payer: BC Managed Care – PPO | Admitting: Family Medicine

## 2020-02-25 ENCOUNTER — Other Ambulatory Visit: Payer: Self-pay

## 2020-02-25 ENCOUNTER — Other Ambulatory Visit: Payer: BC Managed Care – PPO

## 2020-02-25 ENCOUNTER — Encounter: Payer: Self-pay | Admitting: Family Medicine

## 2020-02-25 VITALS — BP 118/84 | HR 120 | Temp 97.7°F | Ht 70.0 in | Wt 247.0 lb

## 2020-02-25 DIAGNOSIS — E1136 Type 2 diabetes mellitus with diabetic cataract: Secondary | ICD-10-CM | POA: Diagnosis not present

## 2020-02-25 DIAGNOSIS — E1165 Type 2 diabetes mellitus with hyperglycemia: Secondary | ICD-10-CM

## 2020-02-25 DIAGNOSIS — R6883 Chills (without fever): Secondary | ICD-10-CM

## 2020-02-25 DIAGNOSIS — R5383 Other fatigue: Secondary | ICD-10-CM | POA: Diagnosis not present

## 2020-02-25 DIAGNOSIS — R739 Hyperglycemia, unspecified: Secondary | ICD-10-CM | POA: Diagnosis not present

## 2020-02-25 DIAGNOSIS — IMO0002 Reserved for concepts with insufficient information to code with codable children: Secondary | ICD-10-CM

## 2020-02-25 DIAGNOSIS — R000 Tachycardia, unspecified: Secondary | ICD-10-CM

## 2020-02-25 DIAGNOSIS — M791 Myalgia, unspecified site: Secondary | ICD-10-CM

## 2020-02-25 LAB — GLUCOSE, POCT (MANUAL RESULT ENTRY): POC Glucose: 373 mg/dl — AB (ref 70–99)

## 2020-02-25 NOTE — Patient Instructions (Addendum)
  Increase lantus to 22 units for now, then discuss further changes with Dr. Loanne Drilling in 2 days.  No other med changes for now.   Side effects from covid vaccine should improve in next day or two at the most. Tylenol, fluids, rest for now. Return to the clinic or go to the nearest emergency room if any of your symptoms worsen or new symptoms occur.  If you have lab work done today you will be contacted with your lab results within the next 2 weeks.  If you have not heard from [place] then please contact us. The fastest way to get your results is to register for My Chart.   IF you received an x-ray today, you will receive an invoice from [facility]. Please contact [facility] at [phone] with questions or concerns regarding your invoice.   IF you received labwork today, you will receive an invoice from [facility]. Please contact [facility] at [phone] with questions or concerns regarding your invoice.   Our billing staff will not be able to assist you with questions regarding bills from these companies.  You will be contacted with the lab results as soon as they are available. The fastest way to get your results is to activate your My Chart account. Instructions are located on the last page of this paperwork. If you have not heard from [place] regarding the results in 2 weeks, please contact this office.    di

## 2020-02-25 NOTE — Progress Notes (Signed)
Subjective:  Patient ID: Ian Hughes, male    DOB: 08-Aug-1981  Age: 39 y.o. MRN: [id number]  CC:  Chief Complaint  Patient presents with  . Follow-up    on diabetes. pt state he can't get his BS down. pt states he checks his BS 2x daily and raily comes below 300mg /dl.   Marland Kitchen COVID-19 Vaccine side effects    pt had the [place] Vaccine yesterday. today he is having body aches, fatigue, and chills. pt has some questions about his side effects and is concerned about getting the second dose.     HPI Ian Hughes presents for   Diabetes: Complicated by hyperglycemia, microalbuminuria, initial difficulty was regimen/dosing.  Last visit March 17.  At that time he was taking 14 units of Lantus.  Still had readings in the 200-300 range.  Lantus increased by 2 units for total of 16 units/day, restarted Januvia 100 mg daily, continued Metformin 500 mg twice daily. Plan for endocrinology follow-up.  appt scheduled with Dr. Loanne Drilling in 2 days.  Microalbumin: Elevated ratio 45 on January 22 He is on ACE inhibitor with lisinopril 5 mg, statin with Lipitor 10 mg. Optho, foot exam, pneumovax: Ophthalmology visit ordered, up-to-date on other health maintenance.   Home readings still in 300's. 322 this am. Highest 380, including high 300's postprandial.  No n/v. No abd pain. No cough.  Using 18 units lantus per day for past approx 2 weeks. No missed doses of metformin, januvia.   Drinking fluids. Less appetite today. Felt fine yesterday.   Lab Results  Component Value Date   HGBA1C 14.0 (A) 12/12/2019   HGBA1C 12.5 12/11/2017   HGBA1C 7.8 09/10/2017   Lab Results  Component Value Date   MICROALBUR 0.8 06/22/2016   LDLCALC 129 (H) 12/12/2019   CREATININE 1.05 12/12/2019    Evaluated March 24 with sleep specialist, home sleep test for apnea screen, hypoxia screen.  Improved hydration and diabetes control for nocturia - thought to be related to hyperglycemia/glycosuria.  Covid vaccine side  effects.  1st Moderna vaccine yesterday at 9:45 am. Woke up this am with fatigue, body aches and cold chills. No measured fever. No change in taste or smell, no exposure to covid 19 known. No dyspnea.  Feels about the same throughout today. Not worse.  Called out of work today.   History Patient Active Problem List   Diagnosis Date Noted  . Type 2 diabetes mellitus with hyperosmolarity without coma, with long-term current use of insulin ([place]) 02/11/2020  . Glucosuria 02/11/2020  . Non-restorative sleep 02/11/2020  . Excessive daytime sleepiness 02/11/2020  . Sleep related headaches 02/11/2020  . Nocturia more than twice per night 02/11/2020  . UARS (upper airway resistance syndrome) 04/07/2015  . Severe obesity (BMI >= 40) (Ian Hughes) 04/07/2015  . Primary snoring 04/07/2015  . Snoring 12/21/2014  . Drowsiness 12/21/2014  . Witnessed apneic spells 12/21/2014  . ED (erectile dysfunction) 09/24/2012  . Diabetes mellitus type 2 in obese ([place]) 07/04/2009    Class: Chronic  . OBESITY, NOS 01/17/2007  . BELLS PALSY 01/17/2007  . HYPERTENSION, BENIGN SYSTEMIC 01/17/2007   Past Medical History:  Diagnosis Date  . Cataract 2011   surgery on R eye  . Diabetes mellitus without complication ([place])   . High cholesterol   . Obesity    Past Surgical History:  Procedure Laterality Date  . EYE SURGERY Left 2011   cataract  . EYE SURGERY Right 2012   No Known Allergies Prior  to Admission medications   Medication Sig Start Date End Date Taking? Authorizing Provider  atorvastatin (LIPITOR) 10 MG tablet Take 1 tablet (10 mg total) by mouth daily. 02/11/20  Yes Wendie Agreste, MD  diazepam (VALIUM) 2 MG tablet Take 1-2 by mouth prior to MRI procedure. 02/11/20  Yes Wendie Agreste, MD  fluticasone (FLONASE) 50 MCG/ACT nasal spray Place 1 spray into both nostrils daily. 12/08/19  Yes Augusto Gamble B, NP  glucose blood (PRODIGY NO CODING BLOOD GLUC) test strip Use as instructed 02/11/20  Yes  Wendie Agreste, MD  ibuprofen (ADVIL) 600 MG tablet Take 1 tablet (600 mg total) by mouth every 6 (six) hours as needed. 10/06/19  Yes Wieters, Hallie C, PA-C  insulin glargine (LANTUS SOLOSTAR) 100 UNIT/ML Solostar Pen Inject 10 Units into the skin every morning. And pen needles 1/day 02/11/20  Yes Wendie Agreste, MD  lisinopril (ZESTRIL) 5 MG tablet Take 1 tablet (5 mg total) by mouth daily. 02/11/20  Yes Wendie Agreste, MD  metFORMIN (GLUCOPHAGE) 500 MG tablet Take 1 tablet (500 mg total) by mouth 2 (two) times daily with a meal. 01/02/20  Yes Wendie Agreste, MD  sitaGLIPtin (JANUVIA) 100 MG tablet TAKE 1 TABLET (100 MG TOTAL) BY MOUTH DAILY. 02/04/20  Yes Wendie Agreste, MD  bromocriptine (PARLODEL) 2.5 MG tablet 1/4 tab daily Patient not taking: Reported on 03/12/2019 01/11/18 10/06/19  Renato Shin, MD  glimepiride (AMARYL) 1 MG tablet Take 0.5 tablets (0.5 mg total) by mouth daily with breakfast. Patient not taking: Reported on 03/12/2019 01/11/18 10/06/19  Renato Shin, MD   Social History   Socioeconomic History  . Marital status: Married    Spouse name: Not on file  . Number of children: 1  . Years of education: 12+  . Highest education level: Not on file  Occupational History    Employer: [employer]  Tobacco Use  . Smoking status: Never Smoker  . Smokeless tobacco: Never Used  Substance and Sexual Activity  . Alcohol use: Not Currently    Alcohol/week: 0.0 standard drinks    Comment: 1 or 2 every 3 months   . Drug use: No  . Sexual activity: Not on file  Other Topics Concern  . Not on file  Social History Narrative   Lives at home with wife and stepson.   Caffeine use: none   Has one child.   Social Determinants of Health   Financial Resource Strain:   . Difficulty of Paying Living Expenses:   Food Insecurity:   . Worried About Charity fundraiser in the Last Year:   . Arboriculturist in the Last Year:   Transportation Needs:   . Film/video editor  (Medical):   Marland Kitchen Lack of Transportation (Non-Medical):   Physical Activity:   . Days of Exercise per Week:   . Minutes of Exercise per Session:   Stress:   . Feeling of Stress :   Social Connections:   . Frequency of Communication with Friends and Family:   . Frequency of Social Gatherings with Friends and Family:   . Attends Religious Services:   . Active Member of Clubs or Organizations:   . Attends Archivist Meetings:   Marland Kitchen Marital Status:   Intimate Partner Violence:   . Fear of Current or Ex-Partner:   . Emotionally Abused:   Marland Kitchen Physically Abused:   . Sexually Abused:     Review of Systems  Objective:   Vitals:   02/25/20 1634  BP: 118/84  Pulse: (!) 120  Temp: 97.7 F (36.5 C)  TempSrc: Temporal  SpO2: 98%  Weight: 247 lb (112 kg)  Height: 5\' 10"  (1.778 m)     Physical Exam Vitals reviewed.  Constitutional:      Appearance: He is well-developed.  HENT:     Head: Normocephalic and atraumatic.  Eyes:     Pupils: Pupils are equal, round, and reactive to light.  Neck:     Vascular: No carotid bruit or JVD.  Cardiovascular:     Rate and Rhythm: Regular rhythm. Tachycardia present.     Heart sounds: Normal heart sounds. No murmur.  Pulmonary:     Effort: Pulmonary effort is normal.     Breath sounds: Normal breath sounds. No rales.  Skin:    General: Skin is warm and dry.  Neurological:     Mental Status: He is alert and oriented to person, place, and time.       Results for orders placed or performed in visit on 02/25/20  POCT glucose (manual entry)  Result Value Ref Range   POC Glucose 373 (A) 70 - 99 mg/dl    Assessment & Plan:  Ian Hughes is a 39 y.o. male . Uncontrolled type 2 diabetes mellitus with diabetic cataract, without long-term current use of insulin ([place]) - Plan: POCT glucose (manual entry), Basic metabolic panel Hyperglycemia - Plan: Basic metabolic panel  - increase lantus to 22 units, continue [place]., metformin,  follow up with endocrinology in 2 days as planned.  Tachycardia - Plan: Basic metabolic panel  - check BMP with tachycardia, but current fatigue, myalgias likely due to covid vaccine side effects  Fatigue, unspecified type Chills Myalgia  - suspected covid 19 vaccine side effects. Symptomatic care. Out of work note given. rtc precautions.   No orders of the defined types were placed in this encounter.  Patient Instructions    Increase lantus to 22 units for now, then discuss further changes with Dr. Loanne Drilling in 2 days.  No other med changes for now.   Side effects from covid vaccine should improve in next day or two at the most. Tylenol, fluids, rest for now. Return to the clinic or go to the nearest emergency room if any of your symptoms worsen or new symptoms occur.  If you have lab work done today you will be contacted with your lab results within the next 2 weeks.  If you have not heard from [place] then please contact us. The fastest way to get your results is to register for My Chart.   IF you received an x-ray today, you will receive an invoice from [facility]. Please contact [facility] at [phone] with questions or concerns regarding your invoice.   IF you received labwork today, you will receive an invoice from [facility]. Please contact [facility] at [phone] with questions or concerns regarding your invoice.   Our billing staff will not be able to assist you with questions regarding bills from these companies.  You will be contacted with the lab results as soon as they are available. The fastest way to get your results is to activate your My Chart account. Instructions are located on the last page of this paperwork. If you have not heard from [place] regarding the results in 2 weeks, please contact this office.    di     Signed, Merri Ray, MD [facility]

## 2020-02-26 LAB — BASIC METABOLIC PANEL
BUN/Creatinine Ratio: 9 (ref 9–20)
BUN: 10 mg/dL (ref 6–20)
CO2: 22 mmol/L (ref 20–29)
Calcium: 9.8 mg/dL (ref 8.7–10.2)
Chloride: 98 mmol/L (ref 96–106)
Creatinine, Ser: 1.12 mg/dL (ref 0.76–1.27)
GFR calc Af Amer: 96 mL/min/{1.73_m2} (ref >59)
GFR calc non Af Amer: 83 mL/min/{1.73_m2} (ref >59)
Glucose: 316 mg/dL — ABNORMAL HIGH (ref 65–99)
Potassium: 4.5 mmol/L (ref 3.5–5.2)
Sodium: 138 mmol/L (ref 134–144)

## 2020-02-27 ENCOUNTER — Ambulatory Visit (INDEPENDENT_AMBULATORY_CARE_PROVIDER_SITE_OTHER): Payer: BC Managed Care – PPO | Admitting: Endocrinology

## 2020-02-27 ENCOUNTER — Encounter: Payer: Self-pay | Admitting: Endocrinology

## 2020-02-27 ENCOUNTER — Other Ambulatory Visit: Payer: Self-pay

## 2020-02-27 VITALS — BP 110/82 | HR 99 | Ht 70.0 in | Wt 249.0 lb

## 2020-02-27 DIAGNOSIS — E1165 Type 2 diabetes mellitus with hyperglycemia: Secondary | ICD-10-CM

## 2020-02-27 DIAGNOSIS — E1136 Type 2 diabetes mellitus with diabetic cataract: Secondary | ICD-10-CM

## 2020-02-27 DIAGNOSIS — E1100 Type 2 diabetes mellitus with hyperosmolarity without nonketotic hyperglycemic-hyperosmolar coma (NKHHC): Secondary | ICD-10-CM

## 2020-02-27 DIAGNOSIS — Z794 Long term (current) use of insulin: Secondary | ICD-10-CM

## 2020-02-27 DIAGNOSIS — IMO0002 Reserved for concepts with insufficient information to code with codable children: Secondary | ICD-10-CM

## 2020-02-27 LAB — POCT GLYCOSYLATED HEMOGLOBIN (HGB A1C): HbA1c POC (<> result, manual entry): >15 % (ref 4.0–5.6)

## 2020-02-27 MED ORDER — LANTUS SOLOSTAR 100 UNIT/ML ~~LOC~~ SOPN: 40.0000 [IU] | PEN_INJECTOR | SUBCUTANEOUS | 99 refills | Status: DC

## 2020-02-27 NOTE — Progress Notes (Signed)
Subjective:    Patient ID: Ian Hughes, male    DOB: [birth year], 39 y.o.   MRN: [id number]  HPI Pt returns for f/u of diabetes mellitus: DM type: Insulin-requiring type 2.  Dx'ed: 123XX123 Complications: none Therapy: insulin since 2019, and 2 oral meds.   DKA: never Severe hypoglycemia: never Pancreatitis: never Pancreatic imaging: never Other: due to h/o noncompliance and severely elev glucose, he is not a candidate for multiple daily injections.   Interval history: He says he never misses the insulin.  He says cbg's are in the 300's.  He has lost 10 lbs in the past 2 years.   Past Medical History:  Diagnosis Date  . Cataract 2011   surgery on R eye  . Diabetes mellitus without complication ([place])   . High cholesterol   . Obesity     Past Surgical History:  Procedure Laterality Date  . EYE SURGERY Left 2011   cataract  . EYE SURGERY Right 2012    Social History   Socioeconomic History  . Marital status: Married    Spouse name: Not on file  . Number of children: 1  . Years of education: 12+  . Highest education level: Not on file  Occupational History    Employer: [employer]  Tobacco Use  . Smoking status: Never Smoker  . Smokeless tobacco: Never Used  Substance and Sexual Activity  . Alcohol use: Not Currently    Alcohol/week: 0.0 standard drinks    Comment: 1 or 2 every 3 months   . Drug use: No  . Sexual activity: Not on file  Other Topics Concern  . Not on file  Social History Narrative   Lives at home with wife and stepson.   Caffeine use: none   Has one child.   Social Determinants of Health   Financial Resource Strain:   . Difficulty of Paying Living Expenses:   Food Insecurity:   . Worried About Charity fundraiser in the Last Year:   . Arboriculturist in the Last Year:   Transportation Needs:   . Film/video editor (Medical):   Marland Kitchen Lack of Transportation (Non-Medical):   Physical Activity:   . Days of Exercise per Week:   . Minutes of  Exercise per Session:   Stress:   . Feeling of Stress :   Social Connections:   . Frequency of Communication with Friends and Family:   . Frequency of Social Gatherings with Friends and Family:   . Attends Religious Services:   . Active Member of Clubs or Organizations:   . Attends Archivist Meetings:   Marland Kitchen Marital Status:   Intimate Partner Violence:   . Fear of Current or Ex-Partner:   . Emotionally Abused:   Marland Kitchen Physically Abused:   . Sexually Abused:     Current Outpatient Medications on File Prior to Visit  Medication Sig Dispense Refill  . atorvastatin (LIPITOR) 10 MG tablet Take 1 tablet (10 mg total) by mouth daily. 90 tablet 3  . diazepam (VALIUM) 2 MG tablet Take 1-2 by mouth prior to MRI procedure. 2 tablet 0  . fluticasone (FLONASE) 50 MCG/ACT nasal spray Place 1 spray into both nostrils daily. 16 g 2  . glucose blood (PRODIGY NO CODING BLOOD GLUC) test strip Use as instructed 100 each 12  . ibuprofen (ADVIL) 600 MG tablet Take 1 tablet (600 mg total) by mouth every 6 (six) hours as needed. 30 tablet 0  .  lisinopril (ZESTRIL) 5 MG tablet Take 1 tablet (5 mg total) by mouth daily. 90 tablet 3  . metFORMIN (GLUCOPHAGE) 500 MG tablet Take 1 tablet (500 mg total) by mouth 2 (two) times daily with a meal. 180 tablet 1  . sitaGLIPtin (JANUVIA) 100 MG tablet TAKE 1 TABLET (100 MG TOTAL) BY MOUTH DAILY. 90 tablet 1  . [DISCONTINUED] bromocriptine (PARLODEL) 2.5 MG tablet 1/4 tab daily (Patient not taking: Reported on 03/12/2019) 25 tablet 3  . [DISCONTINUED] glimepiride (AMARYL) 1 MG tablet Take 0.5 tablets (0.5 mg total) by mouth daily with breakfast. (Patient not taking: Reported on 03/12/2019) 45 tablet 3   No current facility-administered medications on file prior to visit.    No Known Allergies  Family History  Problem Relation Age of Onset  . Diabetes Mother   . Cancer Father        leukemia   . Obesity Other     BP 110/82   Pulse 99   Ht 5\' 10"  (1.778 m)    Wt 249 lb (112.9 kg)   SpO2 95%   BMI 35.73 kg/m    Review of Systems Denies polyuria/n/v/sob    Objective:   Physical Exam VITAL SIGNS:  See vs page GENERAL: no distress Pulses: dorsalis pedis intact bilat.   MSK: no deformity of the feet CV: no leg edema Skin:  no ulcer on the feet.  normal color and temp on the feet.   Neuro: sensation is intact to touch on the feet Ext: there is bilateral onychomycosis of the toenails.    Lab Results  Component Value Date   HGBA1C >15 02/27/2020   Lab Results  Component Value Date   CREATININE 1.12 02/25/2020   BUN 10 02/25/2020   NA 138 02/25/2020   K 4.5 02/25/2020   CL 98 02/25/2020   CO2 22 02/25/2020        Assessment & Plan:  Insulin-requiring type 2 DM: severe exacerbation Noncompliance with cbg recording and f/u.  We discussed getting back on track.  Patient Instructions  check your blood sugar twice a day.  vary the time of day when you check, between before the 3 meals, and at bedtime.  also check if you have symptoms of your blood sugar being too high or too low.  please keep a record of the readings and bring it to your next appointment here (or you can bring the meter itself).  You can write it on any piece of paper.  please call us sooner if your blood sugar goes below 70, or if you have a lot of readings over 200.   I have resent a prescription to your pharmacy, to increase the insulin to 40 units each morning  Please continue the same other diabetes medications.  Drinking plenty of fluids also helps the blood sugar.   Please come back for a follow-up appointment in 1 month.

## 2020-02-27 NOTE — Patient Instructions (Addendum)
check your blood sugar twice a day.  vary the time of day when you check, between before the 3 meals, and at bedtime.  also check if you have symptoms of your blood sugar being too high or too low.  please keep a record of the readings and bring it to your next appointment here (or you can bring the meter itself).  You can write it on any piece of paper.  please call us sooner if your blood sugar goes below 70, or if you have a lot of readings over 200.   I have resent a prescription to your pharmacy, to increase the insulin to 40 units each morning  Please continue the same other diabetes medications.  Drinking plenty of fluids also helps the blood sugar.   Please come back for a follow-up appointment in 1 month.

## 2020-03-03 ENCOUNTER — Telehealth: Payer: Self-pay

## 2020-03-03 NOTE — Telephone Encounter (Signed)
We have attempted to call the patient two times to schedule sleep study.  Patient has been unavailable at the phone numbers we have on file and has not returned our calls. If patient calls back we will schedule them for their sleep study.  

## 2020-04-01 ENCOUNTER — Telehealth: Payer: Self-pay

## 2020-04-01 ENCOUNTER — Ambulatory Visit (INDEPENDENT_AMBULATORY_CARE_PROVIDER_SITE_OTHER): Payer: BC Managed Care – PPO | Admitting: Endocrinology

## 2020-04-01 ENCOUNTER — Other Ambulatory Visit: Payer: Self-pay

## 2020-04-01 ENCOUNTER — Encounter: Payer: Self-pay | Admitting: Endocrinology

## 2020-04-01 VITALS — BP 122/80 | HR 89 | Ht 70.0 in | Wt 254.0 lb

## 2020-04-01 DIAGNOSIS — N522 Drug-induced erectile dysfunction: Secondary | ICD-10-CM

## 2020-04-01 DIAGNOSIS — Z794 Long term (current) use of insulin: Secondary | ICD-10-CM

## 2020-04-01 DIAGNOSIS — E1100 Type 2 diabetes mellitus with hyperosmolarity without nonketotic hyperglycemic-hyperosmolar coma (NKHHC): Secondary | ICD-10-CM

## 2020-04-01 MED ORDER — TADALAFIL 20 MG PO TABS: 20.0000 mg | ORAL_TABLET | Freq: Every day | ORAL | 11 refills | Status: DC | PRN

## 2020-04-01 MED ORDER — TRESIBA FLEXTOUCH 200 UNIT/ML ~~LOC~~ SOPN: 55.0000 [IU] | PEN_INJECTOR | Freq: Every day | SUBCUTANEOUS | 11 refills | Status: DC

## 2020-04-01 NOTE — Telephone Encounter (Signed)
Received PA request from [facility]. Discussed with Dr. Loanne Drilling. States PA will not be completed but rather that pt will need to use [facility], pay out of pocket WITHOUT the use of insurance. Called pt and made him aware. Verbalized acceptance and understanding. Rx has also been re-sent to [facility] as follows:  Outpatient Medication Detail   Disp Refills Start End   tadalafil (CIALIS) 20 MG tablet 10 tablet 11 04/01/2020    Sig - Route: Take 1 tablet (20 mg total) by mouth daily as needed for erectile dysfunction. PT NOT USING INSURANCE. WILL NOT COMPLETE PA. PT WILL PURCHASE OUT OF POCKET WITH [facility]. - Oral   Sent to pharmacy as: Tadalafil 20 MG Oral Tablet (CIALIS)   E-Prescribing Status: Receipt confirmed by pharmacy (04/01/2020 11:15 AM EDT)

## 2020-04-01 NOTE — Patient Instructions (Addendum)
check your blood sugar twice a day.  vary the time of day when you check, between before the 3 meals, and at bedtime.  also check if you have symptoms of your blood sugar being too high or too low.  please keep a record of the readings and bring it to your next appointment here (or you can bring the meter itself).  You can write it on any piece of paper.  please call us sooner if your blood sugar goes below 70, or if you have a lot of readings over 200.   I have sent 2 prescriptions to your pharmacy, to change the Lantus to "[place]," and for a higher amount of Cialis. Please continue the same other diabetes medications.  Please come back for a follow-up appointment in 6 weeks.

## 2020-04-01 NOTE — Progress Notes (Signed)
Subjective:    Patient ID: Ian Hughes, male    DOB: 1981-04-10, 39 y.o.   MRN: [id number]  HPI Pt returns for f/u of diabetes mellitus: DM type: Insulin-requiring type 2.  Dx'ed: 123XX123 Complications: none Therapy: insulin since 2019, and 2 oral meds.   DKA: never Severe hypoglycemia: never Pancreatitis: never Pancreatic imaging: never Other: due to h/o noncompliance and severely elev glucose, he is not a candidate for multiple daily injections.   Interval history: He says he never misses the insulin.  He says cbg's vary from 187-230.    Past Medical History:  Diagnosis Date  . Cataract 2011   surgery on R eye  . Diabetes mellitus without complication ([place])   . High cholesterol   . Obesity     Past Surgical History:  Procedure Laterality Date  . EYE SURGERY Left 2011   cataract  . EYE SURGERY Right 2012    Social History   Socioeconomic History  . Marital status: Married    Spouse name: Not on file  . Number of children: 1  . Years of education: 12+  . Highest education level: Not on file  Occupational History    Employer: [employer]  Tobacco Use  . Smoking status: Never Smoker  . Smokeless tobacco: Never Used  Substance and Sexual Activity  . Alcohol use: Not Currently    Alcohol/week: 0.0 standard drinks    Comment: 1 or 2 every 3 months   . Drug use: No  . Sexual activity: Not on file  Other Topics Concern  . Not on file  Social History Narrative   Lives at home with wife and stepson.   Caffeine use: none   Has one child.   Social Determinants of Health   Financial Resource Strain:   . Difficulty of Paying Living Expenses:   Food Insecurity:   . Worried About Charity fundraiser in the Last Year:   . Arboriculturist in the Last Year:   Transportation Needs:   . Film/video editor (Medical):   Marland Kitchen Lack of Transportation (Non-Medical):   Physical Activity:   . Days of Exercise per Week:   . Minutes of Exercise per Session:   Stress:   .  Feeling of Stress :   Social Connections:   . Frequency of Communication with Friends and Family:   . Frequency of Social Gatherings with Friends and Family:   . Attends Religious Services:   . Active Member of Clubs or Organizations:   . Attends Archivist Meetings:   Marland Kitchen Marital Status:   Intimate Partner Violence:   . Fear of Current or Ex-Partner:   . Emotionally Abused:   Marland Kitchen Physically Abused:   . Sexually Abused:     Current Outpatient Medications on File Prior to Visit  Medication Sig Dispense Refill  . atorvastatin (LIPITOR) 10 MG tablet Take 1 tablet (10 mg total) by mouth daily. 90 tablet 3  . diazepam (VALIUM) 2 MG tablet Take 1-2 by mouth prior to MRI procedure. 2 tablet 0  . fluticasone (FLONASE) 50 MCG/ACT nasal spray Place 1 spray into both nostrils daily. 16 g 2  . glucose blood (PRODIGY NO CODING BLOOD GLUC) test strip Use as instructed 100 each 12  . ibuprofen (ADVIL) 600 MG tablet Take 1 tablet (600 mg total) by mouth every 6 (six) hours as needed. 30 tablet 0  . lisinopril (ZESTRIL) 5 MG tablet Take 1 tablet (5 mg total)  by mouth daily. 90 tablet 3  . metFORMIN (GLUCOPHAGE) 500 MG tablet Take 1 tablet (500 mg total) by mouth 2 (two) times daily with a meal. 180 tablet 1  . sitaGLIPtin (JANUVIA) 100 MG tablet TAKE 1 TABLET (100 MG TOTAL) BY MOUTH DAILY. 90 tablet 1  . [DISCONTINUED] bromocriptine (PARLODEL) 2.5 MG tablet 1/4 tab daily (Patient not taking: Reported on 03/12/2019) 25 tablet 3  . [DISCONTINUED] glimepiride (AMARYL) 1 MG tablet Take 0.5 tablets (0.5 mg total) by mouth daily with breakfast. (Patient not taking: Reported on 03/12/2019) 45 tablet 3   No current facility-administered medications on file prior to visit.    No Known Allergies  Family History  Problem Relation Age of Onset  . Diabetes Mother   . Cancer Father        leukemia   . Obesity Other     BP 122/80   Pulse 89   Ht 5\' 10"  (1.778 m)   Wt 254 lb (115.2 kg)   SpO2 97%    BMI 36.45 kg/m    Review of Systems He denies hypoglycemia.  He reports ED sxs (cialis did not help)    Objective:   Physical Exam VITAL SIGNS:  See vs page GENERAL: no distress Pulses: dorsalis pedis intact bilat.   MSK: no deformity of the feet CV: no leg edema Skin:  no ulcer on the feet.  normal color and temp on the feet. Neuro: sensation is intact to touch on the feet       Assessment & Plan:  Type 2 DM: he needs increased rx.  Noncompliance, by hx.  Tyler Aas offers dosing flexibility.  ED: he needs increased rx  Patient Instructions  check your blood sugar twice a day.  vary the time of day when you check, between before the 3 meals, and at bedtime.  also check if you have symptoms of your blood sugar being too high or too low.  please keep a record of the readings and bring it to your next appointment here (or you can bring the meter itself).  You can write it on any piece of paper.  please call us sooner if your blood sugar goes below 70, or if you have a lot of readings over 200.   I have sent 2 prescriptions to your pharmacy, to change the Lantus to "[place]," and for a higher amount of Cialis. Please continue the same other diabetes medications.  Please come back for a follow-up appointment in 6 weeks.

## 2020-05-14 ENCOUNTER — Ambulatory Visit: Payer: BC Managed Care – PPO | Admitting: Endocrinology

## 2020-06-21 ENCOUNTER — Ambulatory Visit (INDEPENDENT_AMBULATORY_CARE_PROVIDER_SITE_OTHER): Payer: Self-pay | Admitting: Family Medicine

## 2020-06-21 ENCOUNTER — Other Ambulatory Visit: Payer: Self-pay

## 2020-06-21 ENCOUNTER — Encounter: Payer: Self-pay | Admitting: Family Medicine

## 2020-06-21 VITALS — BP 139/83 | HR 83 | Temp 97.1°F | Ht 70.0 in | Wt 250.0 lb

## 2020-06-21 DIAGNOSIS — Z1159 Encounter for screening for other viral diseases: Secondary | ICD-10-CM

## 2020-06-21 DIAGNOSIS — Z794 Long term (current) use of insulin: Secondary | ICD-10-CM

## 2020-06-21 DIAGNOSIS — IMO0002 Reserved for concepts with insufficient information to code with codable children: Secondary | ICD-10-CM

## 2020-06-21 DIAGNOSIS — R739 Hyperglycemia, unspecified: Secondary | ICD-10-CM

## 2020-06-21 DIAGNOSIS — E1136 Type 2 diabetes mellitus with diabetic cataract: Secondary | ICD-10-CM

## 2020-06-21 DIAGNOSIS — E1165 Type 2 diabetes mellitus with hyperglycemia: Secondary | ICD-10-CM

## 2020-06-21 MED ORDER — METFORMIN HCL 500 MG PO TABS: 500.0000 mg | ORAL_TABLET | Freq: Two times a day (BID) | ORAL | 1 refills | Status: DC

## 2020-06-21 MED ORDER — SITAGLIPTIN PHOSPHATE 100 MG PO TABS: ORAL_TABLET | ORAL | 1 refills | Status: DC

## 2020-06-21 NOTE — Progress Notes (Signed)
Subjective:  Patient ID: Ian Hughes, male    DOB: [birth year]  Age: 39 y.o. MRN: [id number]  CC:  Chief Complaint  Patient presents with  . Medication Refill    pt would like a refill on his metformin. Pt reports he takes his medicatiopn as prescribed with no known side effects.  . Diabetes    Pt reports no issues with this condition since last OV. Pt checks his BS once a day. pr reports the readings have been normaly running over 200mg /dl.    HPI [name] presents for  Diabetes: Complicated by hyperglycemia, microalbuminuria.   last visit April 7.  Referred to endocrinology, Dr. Loanne Drilling.  Last visit May 13. On ACE inhibitor and statin. Worsening control with A1c greater than 15 April 9. Tresiba insulin increased to 40 units daily in April, then 56 units on May 13 continued on Metformin 500 mg twice daily, as well as Januvia 100 mg daily.  6 week follow up recommended by endocrinology.  Has missed doses of tresiba - misses about 2 days per week, off [place] for some time.  Ran out of metformin past 4 days.  Still taking lipitor, lisinopril.   Microalbumin: Ratio 45 on 12/12/2019 Optho, foot exam, pneumovax:  Ophthalmology ordered in January. Has not seen optho - hx of diabetic cataract.  Referred to Dr. Katy Fitch.  Up-to-date otherwise. Cialis dose was increased by Dr. Loanne Drilling on May 13 visit.  Working 2 full time jobs, second job at [employer].  Immunization History  Administered Date(s) Administered  . Influenza Split 08/20/2012, 08/20/2013, 08/20/2014  . Influenza-Unspecified 08/20/2016, 09/11/2019  . Moderna SARS-COVID-2 Vaccination 02/24/2020, 03/23/2020  . Pneumococcal Polysaccharide-23 06/22/2016  . Tdap 06/22/2016   Home readings over 200 .  No n/v/abd pain.    Lab Results  Component Value Date   HGBA1C >15 02/27/2020   HGBA1C 14.0 (A) 12/12/2019   HGBA1C 12.5 12/11/2017   Lab Results  Component Value Date    MICROALBUR 0.8 06/22/2016   LDLCALC 129 (H) 12/12/2019   CREATININE 1.12 02/25/2020      History Patient Active Problem List   Diagnosis Date Noted  . Type 2 diabetes mellitus with hyperosmolarity without coma, with long-term current use of insulin ([place]) 02/11/2020  . Glucosuria 02/11/2020  . Non-restorative sleep 02/11/2020  . Excessive daytime sleepiness 02/11/2020  . Sleep related headaches 02/11/2020  . Nocturia more than twice per night 02/11/2020  . UARS (upper airway resistance syndrome) 04/07/2015  . Severe obesity (BMI >= 40) ([place]) 04/07/2015  . Primary snoring 04/07/2015  . Snoring 12/21/2014  . Drowsiness 12/21/2014  . Witnessed apneic spells 12/21/2014  . ED (erectile dysfunction) 09/24/2012  . Diabetes mellitus type 2 in obese ([place]) 07/04/2009    Class: Chronic  . OBESITY, NOS 01/17/2007  . BELLS PALSY 01/17/2007  . HYPERTENSION, BENIGN SYSTEMIC 01/17/2007   Past Medical History:  Diagnosis Date  . Cataract 2011   surgery on R eye  . Diabetes mellitus without complication ([place])   . High cholesterol   . Obesity    Past Surgical History:  Procedure Laterality Date  . EYE SURGERY Left 2011   cataract  . EYE SURGERY Right 2012   No Known Allergies Prior to Admission medications   Medication Sig Start Date End Date Taking? Authorizing Provider  atorvastatin (LIPITOR) 10 MG tablet Take 1 tablet (10 mg total) by mouth daily. 02/11/20  Yes Wendie Agreste, MD  insulin degludec (TRESIBA FLEXTOUCH) 200 UNIT/ML FlexTouch Pen Inject 56 Units into the skin daily. And pen needles 1/day 04/01/20  Yes Renato Shin, MD  lisinopril (ZESTRIL) 5 MG tablet Take 1 tablet (5 mg total) by mouth daily. 02/11/20  Yes Wendie Agreste, MD  diazepam (VALIUM) 2 MG tablet Take 1-2 by mouth prior to MRI procedure. Patient not taking: Reported on 06/21/2020 02/11/20   Wendie Agreste, MD  fluticasone [facility]) 50 MCG/ACT nasal spray Place 1 spray into both nostrils daily. Patient not  taking: Reported on 06/21/2020 12/08/19   Augusto Gamble B, NP  glucose blood (PRODIGY NO CODING BLOOD GLUC) test strip Use as instructed Patient not taking: Reported on 06/21/2020 02/11/20   Wendie Agreste, MD  ibuprofen (ADVIL) 600 MG tablet Take 1 tablet (600 mg total) by mouth every 6 (six) hours as needed. Patient not taking: Reported on 06/21/2020 10/06/19   Wieters, Madelynn Done C, PA-C  metFORMIN (GLUCOPHAGE) 500 MG tablet Take 1 tablet (500 mg total) by mouth 2 (two) times daily with a meal. Patient not taking: Reported on 06/21/2020 01/02/20   Wendie Agreste, MD  sitaGLIPtin (JANUVIA) 100 MG tablet TAKE 1 TABLET (100 MG TOTAL) BY MOUTH DAILY. Patient not taking: Reported on 06/21/2020 02/04/20   Wendie Agreste, MD  tadalafil (CIALIS) 20 MG tablet Take 1 tablet (20 mg total) by mouth daily as needed for erectile dysfunction. PT NOT USING INSURANCE. WILL NOT COMPLETE PA. PT WILL PURCHASE OUT OF POCKET WITH GOOD RX. Patient not taking: Reported on 06/21/2020 04/01/20   Renato Shin, MD  bromocriptine (PARLODEL) 2.5 MG tablet 1/4 tab daily Patient not taking: Reported on 03/12/2019 01/11/18 10/06/19  Renato Shin, MD  glimepiride (AMARYL) 1 MG tablet Take 0.5 tablets (0.5 mg total) by mouth daily with breakfast. Patient not taking: Reported on 03/12/2019 01/11/18 10/06/19  Renato Shin, MD   Social History   Socioeconomic History  . Marital status: Divorced    Spouse name: Not on file  . Number of children: 1  . Years of education: 12+  . Highest education level: Not on file  Occupational History    Employer: [employer]  Tobacco Use  . Smoking status: Never Smoker  . Smokeless tobacco: Never Used  Vaping Use  . Vaping Use: Never used  Substance and Sexual Activity  . Alcohol use: Not Currently    Alcohol/week: 0.0 standard drinks    Comment: 1 or 2 every 3 months   . Drug use: No  . Sexual activity: Not on file  Other Topics Concern  . Not on file  Social History Narrative   Lives at  home with wife and stepson.   Caffeine use: none   Has one child.   Social Determinants of Health   Financial Resource Strain:   . Difficulty of Paying Living Expenses:   Food Insecurity:   . Worried About Charity fundraiser in the Last Year:   . Arboriculturist in the Last Year:   Transportation Needs:   . Film/video editor (Medical):   Marland Kitchen Lack of Transportation (Non-Medical):   Physical Activity:   . Days of Exercise per Week:   . Minutes of Exercise per Session:   Stress:   . Feeling of Stress :   Social Connections:   . Frequency of Communication with Friends and Family:   . Frequency of Social Gatherings with Friends and Family:   . Attends Religious Services:   . Active  Member of Clubs or Organizations:   . Attends Archivist Meetings:   Marland Kitchen Marital Status:   Intimate Partner Violence:   . Fear of Current or Ex-Partner:   . Emotionally Abused:   Marland Kitchen Physically Abused:   . Sexually Abused:     Review of Systems  Constitutional: Negative for fatigue and unexpected weight change.  Eyes: Negative for visual disturbance.  Respiratory: Negative for cough, chest tightness and shortness of breath.   Cardiovascular: Negative for chest pain, palpitations and leg swelling.  Gastrointestinal: Negative for abdominal pain and blood in stool.  Neurological: Negative for dizziness, light-headedness and headaches.     Objective:   Vitals:   06/21/20 1415  BP: 139/83  Pulse: 83  Temp: (!) 97.1 F (36.2 C)  TempSrc: Temporal  SpO2: 98%  Weight: 250 lb (113.4 kg)  Height: 5\' 10"  (1.778 m)     Physical Exam Vitals reviewed.  Constitutional:      Appearance: He is well-developed.  HENT:     Head: Normocephalic and atraumatic.  Eyes:     Pupils: Pupils are equal, round, and reactive to light.  Neck:     Vascular: No carotid bruit or JVD.  Cardiovascular:     Rate and Rhythm: Normal rate and regular rhythm.     Heart sounds: Normal heart sounds. No  murmur heard.   Pulmonary:     Effort: Pulmonary effort is normal.     Breath sounds: Normal breath sounds. No rales.  Skin:    General: Skin is warm and dry.  Neurological:     Mental Status: He is alert and oriented to person, place, and time.        Assessment & Plan:  SEPHIROTH MCLUCKIE is a 39 y.o. male . Uncontrolled type 2 diabetes mellitus with diabetic cataract, without long-term current use of insulin ([place]) - Plan: Hemoglobin A1c, sitaGLIPtin (JANUVIA) 100 MG tablet Type 2 diabetes mellitus with diabetic cataract, with long-term current use of insulin ([place]) - Plan: metFORMIN (GLUCOPHAGE) 500 MG tablet Hyperglycemia - Plan: Lipid panel, Comprehensive metabolic panel  -check N3I, but follow up due with endocrine.   -can complete FMLA if needed for follow up appointments.   - discussed ways to remain consistent with insulin dosing. Restart metformin, januvia.   - optho follow up discussed.   Need for hepatitis C screening test - Plan: Hepatitis C antibody   Meds ordered this encounter  Medications  . metFORMIN (GLUCOPHAGE) 500 MG tablet    Sig: Take 1 tablet (500 mg total) by mouth 2 (two) times daily with a meal.    Dispense:  180 tablet    Refill:  1  . sitaGLIPtin (JANUVIA) 100 MG tablet    Sig: TAKE 1 TABLET (100 MG TOTAL) BY MOUTH DAILY.    Dispense:  90 tablet    Refill:  1   Patient Instructions    Call Dr. Loanne Drilling for appointment - he wanted to see you in 6 weeks from May 13th visit.  It appears you should still be on Januvia - I will refill but ask Dr. Loanne Drilling.  I will refill metformin twice per day.  We can complete paperwork for you to attend medical appointments if needed - ask your human resource person about this.  You may need to take your insulin to work in cold bag or refrigerator so that you do not miss doses.   Call eye doctor for appointment: [facility] [address], [place].  [place], [place] [address] [phone]  If you have lab work  done today you will be contacted with your lab results within the next 2 weeks.  If you have not heard from [place] then please contact us. The fastest way to get your results is to register for My Chart.   IF you received an x-ray today, you will receive an invoice from [facility]. Please contact [facility] at [phone] with questions or concerns regarding your invoice.   IF you received labwork today, you will receive an invoice from [facility]. Please contact [facility] at [phone] with questions or concerns regarding your invoice.   Our billing staff will not be able to assist you with questions regarding bills from these companies.  You will be contacted with the lab results as soon as they are available. The fastest way to get your results is to activate your My Chart account. Instructions are located on the last page of this paperwork. If you have not heard from [place] regarding the results in 2 weeks, please contact this office.         Signed, Merri Ray, MD [facility]

## 2020-06-21 NOTE — Patient Instructions (Addendum)
  Call Dr. Loanne Drilling for appointment - he wanted to see you in 6 weeks from May 13th visit.  It appears you should still be on Januvia - I will refill but ask Dr. Loanne Drilling.  I will refill metformin twice per day.  We can complete paperwork for you to attend medical appointments if needed - ask your human resource person about this.  You may need to take your insulin to work in cold bag or refrigerator so that you do not miss doses.   Call eye doctor for appointment: [facility] [address], [place]. [place], [place] [address] [phone]  If you have lab work done today you will be contacted with your lab results within the next 2 weeks.  If you have not heard from [place] then please contact us. The fastest way to get your results is to register for My Chart.   IF you received an x-ray today, you will receive an invoice from [facility]. Please contact [facility] at [phone] with questions or concerns regarding your invoice.   IF you received labwork today, you will receive an invoice from [facility]. Please contact [facility] at [phone] with questions or concerns regarding your invoice.   Our billing staff will not be able to assist you with questions regarding bills from these companies.  You will be contacted with the lab results as soon as they are available. The fastest way to get your results is to activate your My Chart account. Instructions are located on the last page of this paperwork. If you have not heard from [place] regarding the results in 2 weeks, please contact this office.

## 2020-06-22 LAB — HEMOGLOBIN A1C
Est. average glucose Bld gHb Est-mCnc: 398 mg/dL
Hgb A1c MFr Bld: 15.5 % — ABNORMAL HIGH (ref 4.8–5.6)

## 2020-06-22 LAB — COMPREHENSIVE METABOLIC PANEL
ALT: 28 IU/L (ref 0–44)
AST: 15 IU/L (ref 0–40)
Albumin/Globulin Ratio: 2.2 (ref 1.2–2.2)
Albumin: 4.3 g/dL (ref 4.0–5.0)
Alkaline Phosphatase: 149 IU/L — ABNORMAL HIGH (ref 48–121)
BUN/Creatinine Ratio: 15 (ref 9–20)
BUN: 10 mg/dL (ref 6–20)
Bilirubin Total: 0.2 mg/dL (ref 0.0–1.2)
CO2: 24 mmol/L (ref 20–29)
Calcium: 9.2 mg/dL (ref 8.7–10.2)
Chloride: 102 mmol/L (ref 96–106)
Creatinine, Ser: 0.65 mg/dL — ABNORMAL LOW (ref 0.76–1.27)
GFR calc Af Amer: 143 mL/min/{1.73_m2} (ref >59)
GFR calc non Af Amer: 123 mL/min/{1.73_m2} (ref >59)
Globulin, Total: 2 g/dL (ref 1.5–4.5)
Glucose: 445 mg/dL — ABNORMAL HIGH (ref 65–99)
Potassium: 4.8 mmol/L (ref 3.5–5.2)
Sodium: 136 mmol/L (ref 134–144)
Total Protein: 6.3 g/dL (ref 6.0–8.5)

## 2020-06-22 LAB — LIPID PANEL
Chol/HDL Ratio: 4 ratio (ref 0.0–5.0)
Cholesterol, Total: 176 mg/dL (ref 100–199)
HDL: 44 mg/dL (ref >39)
LDL Chol Calc (NIH): 117 mg/dL — ABNORMAL HIGH (ref 0–99)
Triglycerides: 78 mg/dL (ref 0–149)
VLDL Cholesterol Cal: 15 mg/dL (ref 5–40)

## 2020-06-22 LAB — HEPATITIS C ANTIBODY: Hep C Virus Ab: <0.1 s/co ratio (ref 0.0–0.9)

## 2020-07-30 ENCOUNTER — Ambulatory Visit: Payer: Self-pay | Admitting: Endocrinology

## 2020-07-30 ENCOUNTER — Encounter: Payer: Self-pay | Admitting: Endocrinology

## 2020-07-30 ENCOUNTER — Other Ambulatory Visit: Payer: Self-pay

## 2020-07-30 VITALS — BP 128/72 | HR 91 | Ht 70.0 in | Wt 241.0 lb

## 2020-07-30 DIAGNOSIS — Z794 Long term (current) use of insulin: Secondary | ICD-10-CM

## 2020-07-30 DIAGNOSIS — E1100 Type 2 diabetes mellitus with hyperosmolarity without nonketotic hyperglycemic-hyperosmolar coma (NKHHC): Secondary | ICD-10-CM

## 2020-07-30 MED ORDER — TRESIBA FLEXTOUCH 200 UNIT/ML ~~LOC~~ SOPN: 70.0000 [IU] | PEN_INJECTOR | Freq: Every day | SUBCUTANEOUS | 3 refills | Status: DC

## 2020-07-30 NOTE — Progress Notes (Signed)
Subjective:    Patient ID: Ian Hughes, male    DOB: Jul 28, 1981, 39 y.o.   MRN: [id number]  HPI Pt returns for f/u of diabetes mellitus: DM type: Insulin-requiring type 2.  Dx'ed: 2229 Complications: none Therapy: insulin since 2019, and 2 oral meds.   DKA: never Severe hypoglycemia: never Pancreatitis: never Pancreatic imaging: never SDOH: he works 3rd shift Other: due to h/o noncompliance and severely elev glucose, he is not a candidate for multiple daily injections.   Interval history: He says he never misses the insulin.  He says cbg's vary from 200-390.  pt states he feels well in general.   Past Medical History:  Diagnosis Date  . Cataract 2011   surgery on R eye  . Diabetes mellitus without complication ([place])   . High cholesterol   . Obesity     Past Surgical History:  Procedure Laterality Date  . EYE SURGERY Left 2011   cataract  . EYE SURGERY Right 2012    Social History   Socioeconomic History  . Marital status: Divorced    Spouse name: Not on file  . Number of children: 1  . Years of education: 12+  . Highest education level: Not on file  Occupational History    Employer: [employer]  Tobacco Use  . Smoking status: Never Smoker  . Smokeless tobacco: Never Used  Vaping Use  . Vaping Use: Never used  Substance and Sexual Activity  . Alcohol use: Not Currently    Alcohol/week: 0.0 standard drinks    Comment: 1 or 2 every 3 months   . Drug use: No  . Sexual activity: Not on file  Other Topics Concern  . Not on file  Social History Narrative   Lives at home with wife and stepson.   Caffeine use: none   Has one child.   Social Determinants of Health   Financial Resource Strain:   . Difficulty of Paying Living Expenses: Not on file  Food Insecurity:   . Worried About Charity fundraiser in the Last Year: Not on file  . Ran Out of Food in the Last Year: Not on file  Transportation Needs:   . Lack of Transportation (Medical): Not on file  .  Lack of Transportation (Non-Medical): Not on file  Physical Activity:   . Days of Exercise per Week: Not on file  . Minutes of Exercise per Session: Not on file  Stress:   . Feeling of Stress : Not on file  Social Connections:   . Frequency of Communication with Friends and Family: Not on file  . Frequency of Social Gatherings with Friends and Family: Not on file  . Attends Religious Services: Not on file  . Active Member of Clubs or Organizations: Not on file  . Attends Archivist Meetings: Not on file  . Marital Status: Not on file  Intimate Partner Violence:   . Fear of Current or Ex-Partner: Not on file  . Emotionally Abused: Not on file  . Physically Abused: Not on file  . Sexually Abused: Not on file    Current Outpatient Medications on File Prior to Visit  Medication Sig Dispense Refill  . atorvastatin (LIPITOR) 10 MG tablet Take 1 tablet (10 mg total) by mouth daily. 90 tablet 3  . diazepam (VALIUM) 2 MG tablet Take 1-2 by mouth prior to MRI procedure. 2 tablet 0  . fluticasone (FLONASE) 50 MCG/ACT nasal spray Place 1 spray into both nostrils daily.  16 g 2  . glucose blood (PRODIGY NO CODING BLOOD GLUC) test strip Use as instructed 100 each 12  . ibuprofen (ADVIL) 600 MG tablet Take 1 tablet (600 mg total) by mouth every 6 (six) hours as needed. 30 tablet 0  . lisinopril (ZESTRIL) 5 MG tablet Take 1 tablet (5 mg total) by mouth daily. 90 tablet 3  . metFORMIN (GLUCOPHAGE) 500 MG tablet Take 1 tablet (500 mg total) by mouth 2 (two) times daily with a meal. 180 tablet 1  . sitaGLIPtin (JANUVIA) 100 MG tablet TAKE 1 TABLET (100 MG TOTAL) BY MOUTH DAILY. 90 tablet 1  . tadalafil (CIALIS) 20 MG tablet Take 1 tablet (20 mg total) by mouth daily as needed for erectile dysfunction. PT NOT USING INSURANCE. WILL NOT COMPLETE PA. PT WILL PURCHASE OUT OF POCKET WITH GOOD RX. 10 tablet 11  . [DISCONTINUED] bromocriptine (PARLODEL) 2.5 MG tablet 1/4 tab daily (Patient not taking:  Reported on 03/12/2019) 25 tablet 3  . [DISCONTINUED] glimepiride (AMARYL) 1 MG tablet Take 0.5 tablets (0.5 mg total) by mouth daily with breakfast. (Patient not taking: Reported on 03/12/2019) 45 tablet 3   No current facility-administered medications on file prior to visit.    No Known Allergies  Family History  Problem Relation Age of Onset  . Diabetes Mother   . Cancer Father        leukemia   . Obesity Other     BP 128/72 (BP Location: Left Arm, Patient Position: Sitting, Cuff Size: Normal)   Pulse 91   Ht 5\' 10"  (1.778 m)   Wt 241 lb (109.3 kg)   SpO2 99%   BMI 34.58 kg/m    Review of Systems He denies hypoglycemia.      Objective:   Physical Exam VITAL SIGNS:  See vs page GENERAL: no distress.   Pulses: dorsalis pedis intact bilat.   MSK: no deformity of the feet CV: no leg edema.   Skin:  no ulcer on the feet.  normal color and temp on the feet.   Neuro: sensation is intact to touch on the feet.        Assessment & Plan:  Insulin-requiring type 2 DM: uncontrolled  Patient Instructions  check your blood sugar twice a day.  vary the time of day when you check, between before the 3 meals, and at bedtime.  also check if you have symptoms of your blood sugar being too high or too low.  please keep a record of the readings and bring it to your next appointment here (or you can bring the meter itself).  You can write it on any piece of paper.  please call us sooner if your blood sugar goes below 70, or if you have a lot of readings over 200.   I have sent 2 prescriptions to your pharmacy, to increase the Tresiba to 70 units daily Please continue the same other diabetes medications.  Please come back for a follow-up appointment in 4-6 weeks

## 2020-07-30 NOTE — Patient Instructions (Addendum)
check your blood sugar twice a day.  vary the time of day when you check, between before the 3 meals, and at bedtime.  also check if you have symptoms of your blood sugar being too high or too low.  please keep a record of the readings and bring it to your next appointment here (or you can bring the meter itself).  You can write it on any piece of paper.  please call us sooner if your blood sugar goes below 70, or if you have a lot of readings over 200.   I have sent 2 prescriptions to your pharmacy, to increase the Tresiba to 70 units daily Please continue the same other diabetes medications.  Please come back for a follow-up appointment in 4-6 weeks

## 2020-09-17 ENCOUNTER — Ambulatory Visit: Payer: Self-pay | Admitting: Endocrinology

## 2020-09-17 DIAGNOSIS — E1100 Type 2 diabetes mellitus with hyperosmolarity without nonketotic hyperglycemic-hyperosmolar coma (NKHHC): Secondary | ICD-10-CM

## 2020-12-23 ENCOUNTER — Other Ambulatory Visit: Payer: Self-pay | Admitting: Family Medicine

## 2020-12-23 DIAGNOSIS — Z794 Long term (current) use of insulin: Secondary | ICD-10-CM

## 2020-12-23 DIAGNOSIS — E1136 Type 2 diabetes mellitus with diabetic cataract: Secondary | ICD-10-CM

## 2020-12-23 NOTE — Telephone Encounter (Signed)
Requested medication (s) are due for refill today: yes  Requested medication (s) are on the active medication list: yes  Last refill:  09/20/2020  Future visit scheduled: no  Notes to clinic:  overdue for follow up    Requested Prescriptions  Pending Prescriptions Disp Refills   metFORMIN (GLUCOPHAGE) 500 MG tablet [Pharmacy Med Name: METFORMIN HCL 500 MG TABLET] 180 tablet 1    Sig: TAKE 1 TABLET BY MOUTH 2 TIMES DAILY WITH A MEAL.      Endocrinology:  Diabetes - Biguanides Failed - 12/23/2020 12:34 PM      Failed - Cr in normal range and within 360 days    Creat  Date Value Ref Range Status  06/22/2016 0.98 0.60 - 1.35 mg/dL Final   Creatinine, Ser  Date Value Ref Range Status  06/21/2020 0.65 (L) 0.76 - 1.27 mg/dL Final          Failed - HBA1C is between 0 and 7.9 and within 180 days    HbA1c POC (<> result, manual entry)  Date Value Ref Range Status  02/27/2020 >15 4.0 - 5.6 % Final   Hgb A1c MFr Bld  Date Value Ref Range Status  06/21/2020 15.5 (H) 4.8 - 5.6 % Final    Comment:             Prediabetes: 5.7 - 6.4          Diabetes: >6.4          Glycemic control for adults with diabetes: <7.0           Failed - Valid encounter within last 6 months    Recent Outpatient Visits           6 months ago Uncontrolled type 2 diabetes mellitus with diabetic cataract, without long-term current use of insulin ([place])   [facility], Ranell Patrick, MD   10 months ago Uncontrolled type 2 diabetes mellitus with diabetic cataract, without long-term current use of insulin ([place])   [facility], Ranell Patrick, MD   10 months ago Nonintractable episodic headache, unspecified headache type   [facility], Ranell Patrick, MD   11 months ago Type 2 diabetes mellitus with hyperglycemia, with long-term current use of insulin [facility])   [facility], Ranell Patrick, MD   11 months ago Type 2 diabetes mellitus with hyperglycemia, with  long-term current use of insulin [facility])   [facility], Ranell Patrick, MD                Passed - AA eGFR in normal range and within 360 days    GFR, Est African American  Date Value Ref Range Status  06/22/2016 >89 >=60 mL/min Final   GFR calc Af Amer  Date Value Ref Range Status  06/21/2020 143 >59 mL/min/1.73 Final    Comment:    **[facility] currently reports eGFR in compliance with the current**   recommendations of the [employer]. [facility] will   update reporting as new guidelines are published from the NKF-ASN   Task force.    GFR, Est Non African American  Date Value Ref Range Status  06/22/2016 >89 >=60 mL/min Final   GFR calc non Af Amer  Date Value Ref Range Status  06/21/2020 123 >59 mL/min/1.73 Final

## 2021-11-13 ENCOUNTER — Other Ambulatory Visit: Payer: Self-pay

## 2021-11-13 ENCOUNTER — Encounter (HOSPITAL_COMMUNITY): Payer: Self-pay

## 2021-11-13 ENCOUNTER — Emergency Department (HOSPITAL_COMMUNITY)
Admission: EM | Admit: 2021-11-13 | Discharge: 2021-11-13 | Disposition: A | Discharge status: Home / Self Care | Payer: Self-pay | Attending: Emergency Medicine | Admitting: Emergency Medicine

## 2021-11-13 DIAGNOSIS — U071 COVID-19: Secondary | ICD-10-CM | POA: Insufficient documentation

## 2021-11-13 DIAGNOSIS — E1165 Type 2 diabetes mellitus with hyperglycemia: Secondary | ICD-10-CM | POA: Insufficient documentation

## 2021-11-13 DIAGNOSIS — J069 Acute upper respiratory infection, unspecified: Secondary | ICD-10-CM

## 2021-11-13 DIAGNOSIS — Z7984 Long term (current) use of oral hypoglycemic drugs: Secondary | ICD-10-CM | POA: Insufficient documentation

## 2021-11-13 DIAGNOSIS — Z794 Long term (current) use of insulin: Secondary | ICD-10-CM | POA: Insufficient documentation

## 2021-11-13 LAB — URINALYSIS, ROUTINE W REFLEX MICROSCOPIC
Bacteria, UA: NONE SEEN
Bilirubin Urine: NEGATIVE
Glucose, UA: >1000 mg/dL — AB
Leukocytes,Ua: NEGATIVE
Nitrite: NEGATIVE
Specific Gravity, Urine: 1.015 (ref 1.005–1.030)
pH: 5.5 (ref 5.0–8.0)

## 2021-11-13 LAB — BLOOD GAS, VENOUS
Acid-Base Excess: 2.4 mmol/L — ABNORMAL HIGH (ref 0.0–2.0)
Bicarbonate: 27.3 mmol/L (ref 20.0–28.0)
O2 Saturation: 83.1 %
Patient temperature: 98.6
pCO2, Ven: 45 mmHg (ref 44.0–60.0)
pH, Ven: 7.401 (ref 7.250–7.430)
pO2, Ven: 48.2 mmHg — ABNORMAL HIGH (ref 32.0–45.0)

## 2021-11-13 LAB — CBC WITH DIFFERENTIAL/PLATELET
Abs Immature Granulocytes: 0.03 10*3/uL (ref 0.00–0.07)
Basophils Absolute: 0 10*3/uL (ref 0.0–0.1)
Basophils Relative: 0 %
Eosinophils Absolute: 0 10*3/uL (ref 0.0–0.5)
Eosinophils Relative: 0 %
HCT: 47.3 % (ref 39.0–52.0)
Hemoglobin: 16.2 g/dL (ref 13.0–17.0)
Immature Granulocytes: 0 %
Lymphocytes Relative: 17 %
Lymphs Abs: 1.3 10*3/uL (ref 0.7–4.0)
MCH: 27.7 pg (ref 26.0–34.0)
MCHC: 34.2 g/dL (ref 30.0–36.0)
MCV: 81 fL (ref 80.0–100.0)
Monocytes Absolute: 1 10*3/uL (ref 0.1–1.0)
Monocytes Relative: 14 %
Neutro Abs: 5.2 10*3/uL (ref 1.7–7.7)
Neutrophils Relative %: 69 %
Platelets: 206 10*3/uL (ref 150–400)
RBC: 5.84 MIL/uL — ABNORMAL HIGH (ref 4.22–5.81)
RDW: 12.8 % (ref 11.5–15.5)
WBC: 7.6 10*3/uL (ref 4.0–10.5)
nRBC: 0 % (ref 0.0–0.2)

## 2021-11-13 LAB — RESP PANEL BY RT-PCR (FLU A&B, COVID) ARPGX2
Influenza A by PCR: NEGATIVE
Influenza B by PCR: NEGATIVE
SARS Coronavirus 2 by RT PCR: POSITIVE — AB

## 2021-11-13 LAB — BASIC METABOLIC PANEL
Anion gap: 8 (ref 5–15)
BUN: 15 mg/dL (ref 6–20)
CO2: 27 mmol/L (ref 22–32)
Calcium: 9.1 mg/dL (ref 8.9–10.3)
Chloride: 98 mmol/L (ref 98–111)
Creatinine, Ser: 0.88 mg/dL (ref 0.61–1.24)
GFR, Estimated: >60 mL/min (ref >60)
Glucose, Bld: 425 mg/dL — ABNORMAL HIGH (ref 70–99)
Potassium: 4.2 mmol/L (ref 3.5–5.1)
Sodium: 133 mmol/L — ABNORMAL LOW (ref 135–145)

## 2021-11-13 LAB — CBG MONITORING, ED
Glucose-Capillary: 434 mg/dL — ABNORMAL HIGH (ref 70–99)
Glucose-Capillary: 315 mg/dL — ABNORMAL HIGH (ref 70–99)
Glucose-Capillary: 328 mg/dL — ABNORMAL HIGH (ref 70–99)

## 2021-11-13 MED ORDER — INSULIN ASPART 100 UNIT/ML IJ SOLN
8.0000 [IU] | Freq: Once | INTRAMUSCULAR | Status: AC
  Administered 2021-11-13: 21:00:00 8 [IU] via SUBCUTANEOUS
  Filled 2021-11-13: qty 0.08

## 2021-11-13 MED ORDER — SODIUM CHLORIDE 0.9 % IV BOLUS
1000.0000 mL | Freq: Once | INTRAVENOUS | Status: AC
  Administered 2021-11-13: 20:00:00 1000 mL via INTRAVENOUS

## 2021-11-13 MED ORDER — SITAGLIPTIN PHOSPHATE 100 MG PO TABS: 100.0000 mg | ORAL_TABLET | Freq: Every day | ORAL | 0 refills | Status: DC

## 2021-11-13 MED ORDER — METFORMIN HCL 500 MG PO TABS: 500.0000 mg | ORAL_TABLET | Freq: Two times a day (BID) | ORAL | 0 refills | Status: DC

## 2021-11-13 NOTE — Discharge Instructions (Addendum)
Please pick up and begin taking your metformin twice daily, and januvia once daily. Please follow up to establish care with a primary care doctor so that you can get back on your insulin as soon as possible.  Your other symptoms are consistent with an upper respiratory infection, likely the flu, COVID, or a cold.  Recommend that you rest, drink plenty of fluids, you can take Tylenol, ibuprofen for fever, chills, Robitussin for sore throat.  If your fever worsens, you develop chest pain, shortness of breath please return for further evaluation.

## 2021-11-13 NOTE — ED Provider Notes (Addendum)
Ian Hughes   CSN: [id number] Arrival date & time: 11/13/21  1944     History Chief Complaint  Patient presents with   Cough   Chills   Generalized Body Aches   Headache    Ian Hughes is a 40 y.o. male with a PMH significant for diabetes who is on insulin who presents with 2 days of cough, body aches, chills, headache. Patient denies nausea, vomiting, diarrhea. Patient denies chest pain, shortness of breath. Patient reports he is worried about his blood sugar because he hasn't taken his insulin since July. Patient denies confusion, abdominal pain. He does endorse some peripheral tingling in his right hand. Patient is not sure what amount of insulin he used to take but reports he thinks it was ~50 units daily.   Cough Associated symptoms: chills and headaches   Headache Associated symptoms: cough       Past Medical History:  Diagnosis Date   Cataract 2011   surgery on R eye   Diabetes mellitus without complication ([place])    High cholesterol    Obesity     Patient Active Problem List   Diagnosis Date Noted   Type 2 diabetes mellitus with hyperosmolarity without coma, with long-term current use of insulin ([place]) 02/11/2020   Glucosuria 02/11/2020   Non-restorative sleep 02/11/2020   Excessive daytime sleepiness 02/11/2020   Sleep related headaches 02/11/2020   Nocturia more than twice per night 02/11/2020   UARS (upper airway resistance syndrome) 04/07/2015   Severe obesity (BMI >= 40) ([place]) 04/07/2015   Primary snoring 04/07/2015   Snoring 12/21/2014   Drowsiness 12/21/2014   Witnessed apneic spells 12/21/2014   ED (erectile dysfunction) 09/24/2012   Diabetes mellitus type 2 in obese ([place]) 07/04/2009    Class: Chronic   OBESITY, NOS 01/17/2007   BELLS PALSY 01/17/2007   HYPERTENSION, BENIGN SYSTEMIC 01/17/2007    Past Surgical History:  Procedure Laterality Date   EYE SURGERY Left 2011   cataract   EYE  SURGERY Right 2012       Family History  Problem Relation Age of Onset   Diabetes Mother    Cancer Father        leukemia    Obesity Other     Social History   Tobacco Use   Smoking status: Never   Smokeless tobacco: Never  Vaping Use   Vaping Use: Never used  Substance Use Topics   Alcohol use: Not Currently    Alcohol/week: 0.0 standard drinks    Comment: 1 or 2 every 3 months    Drug use: No    Home Medications Prior to Admission medications   Medication Sig Start Date End Date Taking? Authorizing Provider  metFORMIN (GLUCOPHAGE) 500 MG tablet Take 1 tablet (500 mg total) by mouth 2 (two) times daily with a meal. 11/13/21  Yes Lieutenant Abarca H, PA-C  sitaGLIPtin (JANUVIA) 100 MG tablet Take 1 tablet (100 mg total) by mouth daily. 11/13/21  Yes Tierre Netto H, PA-C  atorvastatin (LIPITOR) 10 MG tablet Take 1 tablet (10 mg total) by mouth daily. 02/11/20   Wendie Agreste, MD  diazepam (VALIUM) 2 MG tablet Take 1-2 by mouth prior to MRI procedure. 02/11/20   Wendie Agreste, MD  fluticasone (FLONASE) 50 MCG/ACT nasal spray Place 1 spray into both nostrils daily. 12/08/19   Augusto Gamble B, NP  glucose blood (PRODIGY NO CODING BLOOD GLUC) test strip Use as instructed 02/11/20  Wendie Agreste, MD  ibuprofen (ADVIL) 600 MG tablet Take 1 tablet (600 mg total) by mouth every 6 (six) hours as needed. 10/06/19   Wieters, Hallie C, PA-C  insulin degludec (TRESIBA FLEXTOUCH) 200 UNIT/ML FlexTouch Pen Inject 70 Units into the skin daily. And pen needles 1/day 07/30/20   Renato Shin, MD  lisinopril (ZESTRIL) 5 MG tablet Take 1 tablet (5 mg total) by mouth daily. 02/11/20   Wendie Agreste, MD  tadalafil (CIALIS) 20 MG tablet Take 1 tablet (20 mg total) by mouth daily as needed for erectile dysfunction. PT NOT USING INSURANCE. WILL NOT COMPLETE PA. PT WILL PURCHASE OUT OF POCKET WITH [facility]. 04/01/20   Renato Shin, MD  bromocriptine (PARLODEL) 2.5 MG tablet 1/4 tab  daily Patient not taking: Reported on 03/12/2019 01/11/18 10/06/19  Renato Shin, MD  glimepiride (AMARYL) 1 MG tablet Take 0.5 tablets (0.5 mg total) by mouth daily with breakfast. Patient not taking: Reported on 03/12/2019 01/11/18 10/06/19  Renato Shin, MD    Allergies    Patient has no known allergies.  Review of Systems   Review of Systems  Constitutional:  Positive for chills.  Respiratory:  Positive for cough.   Neurological:  Positive for headaches.  All other systems reviewed and are negative.  Physical Exam Updated Vital Signs BP (!) 176/106    Pulse (!) 110    Temp 98.9 F (37.2 C) (Oral)    Resp 16    Ht 5\' 10"  (1.778 m)    Wt 109.3 kg    SpO2 98%    BMI 34.58 kg/m   Physical Exam Vitals and nursing Hughes reviewed.  Constitutional:      General: He is not in acute distress.    Appearance: Normal appearance. He is obese.  HENT:     Head: Normocephalic and atraumatic.     Comments: Posterior oropharynx erythematous without exudate, no unilateral swelling, peritonsillar abscess, uvula midline Eyes:     General:        Right eye: No discharge.        Left eye: No discharge.  Cardiovascular:     Rate and Rhythm: Regular rhythm. Tachycardia present.     Heart sounds: No murmur heard.   No friction rub. No gallop.  Pulmonary:     Effort: Pulmonary effort is normal.     Breath sounds: Normal breath sounds.  Abdominal:     General: Bowel sounds are normal.     Palpations: Abdomen is soft.  Musculoskeletal:     Cervical back: Neck supple.  Lymphadenopathy:     Cervical: No cervical adenopathy.  Skin:    General: Skin is warm and dry.     Capillary Refill: Capillary refill takes less than 2 seconds.  Neurological:     Mental Status: He is alert and oriented to person, place, and time.  Psychiatric:        Mood and Affect: Mood normal.        Behavior: Behavior normal.    ED Results / Procedures / Treatments   Labs (all labs ordered are listed, but only  abnormal results are displayed) Labs Reviewed  BLOOD GAS, VENOUS - Abnormal; Notable for the following components:      Result Value   pO2, Ven 48.2 (*)    Acid-Base Excess 2.4 (*)    All other components within normal limits  CBC WITH DIFFERENTIAL/PLATELET - Abnormal; Notable for the following components:   RBC 5.84 (*)  All other components within normal limits  BASIC METABOLIC PANEL - Abnormal; Notable for the following components:   Sodium 133 (*)    Glucose, Bld 425 (*)    All other components within normal limits  CBG MONITORING, ED - Abnormal; Notable for the following components:   Glucose-Capillary 434 (*)    All other components within normal limits  CBG MONITORING, ED - Abnormal; Notable for the following components:   Glucose-Capillary 315 (*)    All other components within normal limits  CBG MONITORING, ED - Abnormal; Notable for the following components:   Glucose-Capillary 328 (*)    All other components within normal limits  RESP PANEL BY RT-PCR (FLU A&B, COVID) ARPGX2  URINALYSIS, ROUTINE W REFLEX MICROSCOPIC    EKG None  Radiology No results found.  Procedures Procedures   Medications Ordered in ED Medications  sodium chloride 0.9 % bolus 1,000 mL (0 mLs Intravenous Stopped 11/13/21 2058)  insulin aspart (novoLOG) injection 8 Units (8 Units Subcutaneous Given 11/13/21 2058)    ED Course  I have reviewed the triage vital signs and the nursing notes.  Pertinent labs & imaging results that were available during my care of the patient were reviewed by me and considered in my medical decision making (see chart for details).    MDM Rules/Calculators/A&P                         Overall well appearing patient with two issues -- two days of uri symptoms. Clear oropharynx, no evidence of thrush, streptococcal pharyngitis, PTA, uvular deviation. No stridor or respiratory compromise.  CBG of 434, labwork reveals no acidosis, no gap. Patient endorses not  using his insulin for several months. We will admin fluid bolus, subcu insulin 8 units and recheck CBG.  Hypertension to systolic 098. No chest pain, vision changes, or other evidence of sequelae from hypertension.  Repeat CBG 328 s/p insulin, fluids. RVP still pending, but patient VSS other than transient tachycardia, hypertension. As patient does not know his insulin dosage, I recommend he restart his oral anti-hyperglycemic medications at this time. Will send rx for metformin bid, januiva. Discussed other symptoms represent a likely URI. No accessory breath sounds, respiratory distress. Encouraged supportive care.  Patient discharged in stable condition at this time.   Final Clinical Impression(s) / ED Diagnoses Final diagnoses:  Hyperglycemia due to diabetes mellitus ([place])  Viral upper respiratory tract infection    Rx / DC Orders ED Discharge Orders          Ordered    Ambulatory referral to Endocrinology        11/13/21 2137    metFORMIN (GLUCOPHAGE) 500 MG tablet  2 times daily with meals        11/13/21 2142    sitaGLIPtin (JANUVIA) 100 MG tablet  Daily        11/13/21 2142             Candra Wegner, Keota H, PA-C 11/13/21 2145    Dorien Chihuahua 11/13/21 2146    Godfrey Pick, MD 11/13/21 [phone]

## 2021-11-13 NOTE — ED Triage Notes (Signed)
Pt reports with cough, generalized body aches, chills, and headache since Saturday. Pt also states that he has not been managing his Diabetes.

## 2021-11-14 ENCOUNTER — Ambulatory Visit (HOSPITAL_COMMUNITY)
Admission: EM | Admit: 2021-11-14 | Discharge: 2021-11-14 | Disposition: A | Discharge status: Home / Self Care | Payer: Self-pay | Attending: Family Medicine | Admitting: Family Medicine

## 2021-11-14 ENCOUNTER — Encounter (HOSPITAL_COMMUNITY): Payer: Self-pay

## 2021-11-14 DIAGNOSIS — E118 Type 2 diabetes mellitus with unspecified complications: Secondary | ICD-10-CM

## 2021-11-14 DIAGNOSIS — U071 COVID-19: Secondary | ICD-10-CM

## 2021-11-14 DIAGNOSIS — E119 Type 2 diabetes mellitus without complications: Secondary | ICD-10-CM

## 2021-11-14 MED ORDER — NIRMATRELVIR/RITONAVIR (PAXLOVID)TABLET: ORAL_TABLET | ORAL | 0 refills | Status: DC

## 2021-11-14 NOTE — ED Triage Notes (Signed)
Pt reports being at [facility] last night. States he tested positive for COVID. States he is here to have the anti viral medicine prescribed.   States he has not had a fever and has had chills.

## 2021-11-14 NOTE — ED Provider Notes (Signed)
[place]    CSN: [id number] Arrival date & time: 11/14/21  1447      History   Chief Complaint No chief complaint on file.   HPI Ian Hughes is a 40 y.o. male.   HPI Here for 2 day h/o chills and malaise. Has had some cough and runny nose too. Symptoms began 12/24. Tested pos for covid yesterday at [facility], results reviewed in [place].  Pt takes meds for DM.  Past Medical History:  Diagnosis Date   Cataract 2011   surgery on R eye   Diabetes mellitus without complication ([place])    High cholesterol    Obesity     Patient Active Problem List   Diagnosis Date Noted   Type 2 diabetes mellitus with hyperosmolarity without coma, with long-term current use of insulin ([place]) 02/11/2020   Glucosuria 02/11/2020   Non-restorative sleep 02/11/2020   Excessive daytime sleepiness 02/11/2020   Sleep related headaches 02/11/2020   Nocturia more than twice per night 02/11/2020   UARS (upper airway resistance syndrome) 04/07/2015   Severe obesity (BMI >= 40) ([place]) 04/07/2015   Primary snoring 04/07/2015   Snoring 12/21/2014   Drowsiness 12/21/2014   Witnessed apneic spells 12/21/2014   ED (erectile dysfunction) 09/24/2012   Diabetes mellitus type 2 in obese ([place]) 07/04/2009    Class: Chronic   OBESITY, NOS 01/17/2007   BELLS PALSY 01/17/2007   HYPERTENSION, BENIGN SYSTEMIC 01/17/2007    Past Surgical History:  Procedure Laterality Date   EYE SURGERY Left 2011   cataract   EYE SURGERY Right 2012       Home Medications    Prior to Admission medications   Medication Sig Start Date End Date Taking? Authorizing Provider  nirmatrelvir/ritonavir EUA (PAXLOVID) 20 x 150 MG & 10 x 100MG  TABS Take nirmatrelvir (150 mg) two tablets twice daily for 5 days and ritonavir (100 mg) one tablet twice daily for 5 days. 11/14/21  Yes Barrett Henle, MD  atorvastatin (LIPITOR) 10 MG tablet Take 1 tablet (10 mg total) by mouth daily. 02/11/20   Wendie Agreste, MD   diazepam (VALIUM) 2 MG tablet Take 1-2 by mouth prior to MRI procedure. 02/11/20   Wendie Agreste, MD  fluticasone (FLONASE) 50 MCG/ACT nasal spray Place 1 spray into both nostrils daily. 12/08/19   Augusto Gamble B, NP  glucose blood (PRODIGY NO CODING BLOOD GLUC) test strip Use as instructed 02/11/20   Wendie Agreste, MD  ibuprofen (ADVIL) 600 MG tablet Take 1 tablet (600 mg total) by mouth every 6 (six) hours as needed. 10/06/19   Wieters, Hallie C, PA-C  insulin degludec (TRESIBA FLEXTOUCH) 200 UNIT/ML FlexTouch Pen Inject 70 Units into the skin daily. And pen needles 1/day 07/30/20   Renato Shin, MD  lisinopril (ZESTRIL) 5 MG tablet Take 1 tablet (5 mg total) by mouth daily. 02/11/20   Wendie Agreste, MD  metFORMIN (GLUCOPHAGE) 500 MG tablet Take 1 tablet (500 mg total) by mouth 2 (two) times daily with a meal. 11/13/21   Prosperi, Christian H, PA-C  sitaGLIPtin (JANUVIA) 100 MG tablet Take 1 tablet (100 mg total) by mouth daily. 11/13/21   Prosperi, Christian H, PA-C  tadalafil (CIALIS) 20 MG tablet Take 1 tablet (20 mg total) by mouth daily as needed for erectile dysfunction. PT NOT USING INSURANCE. WILL NOT COMPLETE PA. PT WILL PURCHASE OUT OF POCKET WITH [facility]. 04/01/20   Renato Shin, MD  bromocriptine (PARLODEL) 2.5 MG tablet  1/4 tab daily Patient not taking: Reported on 03/12/2019 01/11/18 10/06/19  Renato Shin, MD  glimepiride (AMARYL) 1 MG tablet Take 0.5 tablets (0.5 mg total) by mouth daily with breakfast. Patient not taking: Reported on 03/12/2019 01/11/18 10/06/19  Renato Shin, MD    Family History Family History  Problem Relation Age of Onset   Diabetes Mother    Cancer Father        leukemia    Obesity Other     Social History Social History   Tobacco Use   Smoking status: Never   Smokeless tobacco: Never  Vaping Use   Vaping Use: Never used  Substance Use Topics   Alcohol use: Not Currently    Alcohol/week: 0.0 standard drinks    Comment: 1 or 2 every  3 months    Drug use: No     Allergies   Patient has no known allergies.   Review of Systems Review of Systems   Physical Exam Triage Vital Signs ED Triage Vitals [11/14/21 1636]  Enc Vitals Group     BP (!) 153/81     Pulse Rate 97     Resp 17     Temp 98.1 F (36.7 C)     Temp Source Oral     SpO2 99 %     Weight      Height      Head Circumference      Peak Flow      Pain Score 0     Pain Loc      Pain Edu?      Excl. in [place]?    No data found.  Updated Vital Signs BP (!) 153/81 (BP Location: Right Arm)    Pulse 97    Temp 98.1 F (36.7 C) (Oral)    Resp 17    SpO2 99%   Visual Acuity Right Eye Distance:   Left Eye Distance:   Bilateral Distance:    Right Eye Near:   Left Eye Near:    Bilateral Near:     Physical Exam Vitals reviewed.  Constitutional:      General: He is not in acute distress.    Appearance: He is not toxic-appearing.  Cardiovascular:     Rate and Rhythm: Normal rate and regular rhythm.     Heart sounds: No murmur heard. Pulmonary:     Effort: Pulmonary effort is normal.     Breath sounds: Normal breath sounds. No wheezing, rhonchi or rales.  Neurological:     Mental Status: He is alert and oriented to person, place, and time.  Psychiatric:        Behavior: Behavior normal.     UC Treatments / Results  Labs (all labs ordered are listed, but only abnormal results are displayed) Labs Reviewed - No data to display  EKG   Radiology No results found.  Procedures Procedures (including critical care time)  Medications Ordered in UC Medications - No data to display  Initial Impression / Assessment and Plan / UC Course  I have reviewed the triage vital signs and the nursing notes.  Pertinent labs & imaging results that were available during my care of the patient were reviewed by me and considered in my medical decision making (see chart for details).     With his DM, will tx with paxlovid since he is within 2 days of  starting symptoms Final Clinical Impressions(s) / UC Diagnoses   Final diagnoses:  COVID  Diabetes mellitus without  complication [facility])   Discharge Instructions   None    ED Prescriptions     Medication Sig Dispense Auth. Provider   nirmatrelvir/ritonavir EUA (PAXLOVID) 20 x 150 MG & 10 x 100MG  TABS Take nirmatrelvir (150 mg) two tablets twice daily for 5 days and ritonavir (100 mg) one tablet twice daily for 5 days. 30 tablet Reynolds Kittel, Gwenlyn Perking, MD      PDMP not reviewed this encounter.   Barrett Henle, MD 11/14/21 [phone]

## 2021-11-14 NOTE — Discharge Instructions (Signed)
Take the paxlovid according to package instructions. Quarantine 7 days from the start of your symptoms.

## 2022-01-24 ENCOUNTER — Ambulatory Visit (INDEPENDENT_AMBULATORY_CARE_PROVIDER_SITE_OTHER): Payer: Commercial Managed Care - PPO

## 2022-01-24 ENCOUNTER — Encounter (HOSPITAL_COMMUNITY): Payer: Self-pay

## 2022-01-24 ENCOUNTER — Ambulatory Visit (HOSPITAL_COMMUNITY)
Admission: EM | Admit: 2022-01-24 | Discharge: 2022-01-24 | Disposition: A | Discharge status: Home / Self Care | Payer: Commercial Managed Care - PPO | Attending: Family Medicine | Admitting: Family Medicine

## 2022-01-24 ENCOUNTER — Other Ambulatory Visit: Payer: Self-pay

## 2022-01-24 DIAGNOSIS — R059 Cough, unspecified: Secondary | ICD-10-CM

## 2022-01-24 DIAGNOSIS — R053 Chronic cough: Secondary | ICD-10-CM | POA: Diagnosis not present

## 2022-01-24 MED ORDER — BENZONATATE 100 MG PO CAPS: ORAL_CAPSULE | ORAL | 0 refills | Status: DC

## 2022-01-24 MED ORDER — LOSARTAN POTASSIUM 25 MG PO TABS: 25.0000 mg | ORAL_TABLET | Freq: Every day | ORAL | 2 refills | Status: DC

## 2022-01-24 NOTE — Discharge Instructions (Signed)
Your chest x-ray was normal today. ?

## 2022-01-24 NOTE — ED Triage Notes (Signed)
Pt presents with non productive cough for past few weeks.  ?

## 2022-01-24 NOTE — ED Provider Notes (Signed)
?[place] ? ? ?[id number] ?01/24/22 Arrival Time: 0902 ? ?ASSESSMENT & PLAN: ? ?1. Persistent cough for 3 weeks or longer   ? ?I have personally viewed the imaging studies ordered this visit. ?No acute changes on CXR. ? ?Ques lisinopril as culprit. Discussed. No h/o GERD or symptom of GERD. ? ?Discharge Medication List as of 01/24/2022 11:26 AM  ?  ? ?START taking these medications  ? Details  ?benzonatate (TESSALON) 100 MG capsule Take 1 capsule by mouth every 8 (eight) hours for cough., Normal  ?  ?losartan (COZAAR) 25 MG tablet Take 1 tablet (25 mg total) by mouth daily., Starting Tue 01/24/2022, Normal  ?  ?  ? ? ? Follow-up Information   ? ? Wendie Agreste, MD. Schedule an appointment as soon as possible for a visit .   ?Specialties: Family Medicine, Sports Medicine ?Contact information: ?[address] ?[place] [address] ?[phone] ? ? ?  ?  ? ? Caren Macadam, MD. Schedule an appointment as soon as possible for a visit .   ?Specialty: Family Medicine ?Contact information: ?[address] ?[place] [address] ?[phone] ? ? ?  ?  ? ?  ?  ? ?  ? ? ?Reviewed expectations re: course of current medical issues. Questions answered. ?Outlined signs and symptoms indicating need for more acute intervention. ?Understanding verbalized. ?After Visit Summary given. ? ? ?SUBJECTIVE: ?History from: Patient. ?Ian Hughes is a 41 y.o. male. Reports: persistent coughing; past month or so. No illness noted. No new medications. Describes dry, hacking cough. No SOB/wheezing. No h/o asthma. Non-smoker. Normal PO intake without n/v/d. ?No h/o GERD. ? ?OBJECTIVE: ? ?Vitals:  ? 01/24/22 1021  ?BP: (!) 154/93  ?Pulse: 95  ?Resp: 17  ?Temp: 98.7 ?F (37.1 ?C)  ?TempSrc: Oral  ?SpO2: 97%  ?  ?General appearance: alert; no distress ?Eyes: PERRLA; EOMI; conjunctiva normal ?HENT: [place]; AT; without nasal congestion ?Neck: supple  ?Lungs: speaks full sentences without difficulty; unlabored; CTAB; dry  cough ?Extremities: no edema ?Skin: warm and dry ?Neurologic: normal gait ?Psychological: alert and cooperative; normal mood and affect ? ?Imaging: ?DG Chest 2 View ? ?Result Date: 01/24/2022 ?CLINICAL DATA:  Nonproductive cough for few weeks EXAM: CHEST - 2 VIEW COMPARISON:  None. FINDINGS: Normal heart size. Normal mediastinal contour. No pneumothorax. No pleural effusion. Lungs appear clear, with no acute consolidative airspace disease and no pulmonary edema. IMPRESSION: No active cardiopulmonary disease. Electronically Signed   By: Ilona Sorrel M.D.   On: 01/24/2022 11:04  ? ? ?No Known Allergies ? ?Past Medical History:  ?Diagnosis Date  ? Cataract 2011  ? surgery on R eye  ? Diabetes mellitus without complication ([place])   ? High cholesterol   ? Obesity   ? ?Social History  ? ?Socioeconomic History  ? Marital status: Divorced  ?  Spouse name: Not on file  ? Number of children: 1  ? Years of education: 12+  ? Highest education level: Not on file  ?Occupational History  ?  Employer: [employer]  ?Tobacco Use  ? Smoking status: Never  ? Smokeless tobacco: Never  ?Vaping Use  ? Vaping Use: Never used  ?Substance and Sexual Activity  ? Alcohol use: Not Currently  ?  Alcohol/week: 0.0 standard drinks  ?  Comment: 1 or 2 every 3 months   ? Drug use: No  ? Sexual activity: Not on file  ?Other Topics Concern  ? Not on file  ?Social  History Narrative  ? Lives at home with wife and stepson.  ? Caffeine use: none  ? Has one child.  ? ?Social Determinants of Health  ? ?Financial Resource Strain: Not on file  ?Food Insecurity: Not on file  ?Transportation Needs: Not on file  ?Physical Activity: Not on file  ?Stress: Not on file  ?Social Connections: Not on file  ?Intimate Partner Violence: Not on file  ? ?Family History  ?Problem Relation Age of Onset  ? Diabetes Mother   ? Cancer Father   ?     leukemia   ? Obesity Other   ? ?Past Surgical History:  ?Procedure Laterality Date  ? EYE SURGERY Left 2011  ? cataract  ? EYE SURGERY  Right 2012  ? ?  ?Vanessa Kick, MD ?01/24/22 1417 ? ?

## 2022-06-13 ENCOUNTER — Ambulatory Visit (INDEPENDENT_AMBULATORY_CARE_PROVIDER_SITE_OTHER): Payer: Commercial Managed Care - PPO | Admitting: Internal Medicine

## 2022-06-13 ENCOUNTER — Encounter: Payer: Self-pay | Admitting: Internal Medicine

## 2022-06-13 VITALS — BP 136/82 | HR 89 | Ht 70.0 in | Wt 236.0 lb

## 2022-06-13 DIAGNOSIS — E1142 Type 2 diabetes mellitus with diabetic polyneuropathy: Secondary | ICD-10-CM | POA: Insufficient documentation

## 2022-06-13 DIAGNOSIS — E1165 Type 2 diabetes mellitus with hyperglycemia: Secondary | ICD-10-CM | POA: Diagnosis not present

## 2022-06-13 DIAGNOSIS — Z794 Long term (current) use of insulin: Secondary | ICD-10-CM | POA: Insufficient documentation

## 2022-06-13 LAB — POCT GLYCOSYLATED HEMOGLOBIN (HGB A1C): Hemoglobin A1C: 14.5 % — AB (ref 4.0–5.6)

## 2022-06-13 LAB — POCT GLUCOSE (DEVICE FOR HOME USE): POC Glucose: 401 mg/dl — AB (ref 70–99)

## 2022-06-13 MED ORDER — TOUJEO MAX SOLOSTAR 300 UNIT/ML ~~LOC~~ SOPN: 30.0000 [IU] | PEN_INJECTOR | Freq: Every day | SUBCUTANEOUS | 3 refills | Status: DC

## 2022-06-13 MED ORDER — INSULIN PEN NEEDLE 32G X 4 MM MISC: 1.0000 | Freq: Every day | 3 refills | Status: DC

## 2022-06-13 MED ORDER — SEMAGLUTIDE(0.25 OR 0.5MG/DOS) 2 MG/3ML ~~LOC~~ SOPN: 0.5000 mg | PEN_INJECTOR | SUBCUTANEOUS | 1 refills | Status: DC

## 2022-06-13 MED ORDER — DEXCOM G7 SENSOR MISC: 1.0000 | 3 refills | Status: DC

## 2022-06-13 NOTE — Progress Notes (Signed)
Name: Ian Hughes  Age/ Sex: 41 y.o., male   MRN/ DOB: [id number], [birth year]     PCP: Wendie Agreste, MD   Reason for Endocrinology Evaluation: Type 2 Diabetes Mellitus  Initial Endocrine Consultative Visit: 07/10/2017    PATIENT IDENTIFIER: Ian Hughes is a 41 y.o. male with a past medical history of T2DM, dyslipidemia, HTN. The patient has followed with Endocrinology clinic since 07/10/2017 for consultative assistance with management of his diabetes.  DIABETIC HISTORY:  Ian Hughes was diagnosed with DM in 2009, he was started on insulin in 2019 , Metformin caused diarrhea . His hemoglobin A1c has ranged from 7.1% in 2016, peaking at 15.5% in 2021.  He last saw Dr. Loanne Drilling in September 2021 SUBJECTIVE:   During the last visit (07/30/2020): Saw Dr. Loanne Drilling  Today (06/13/2022): Ian Hughes  is here for a diabetes management. He checks his blood sugars 0 times daily. Patient presented to the ED in December 2022 with hyperglycemia  Denies nausea, vomiting or diarrhea  Has noted feet feel weird   HOME DIABETES REGIMEN:  Metformin 500 mg 2 tabs daily Januvia 100 mg daily Tresiba 70 units daily     Statin: Yes ACE-I/ARB: Yes    METER DOWNLOAD SUMMARY: does not check     DIABETIC COMPLICATIONS: Microvascular complications:   Denies: CKD, neuropathy  Last Eye Exam: Completed years ago   Macrovascular complications:   Denies: CAD, CVA, PVD   HISTORY:  Past Medical History:  Past Medical History:  Diagnosis Date   Cataract 2011   surgery on R eye   Diabetes mellitus without complication ([place])    High cholesterol    Obesity    Past Surgical History:  Past Surgical History:  Procedure Laterality Date   EYE SURGERY Left 2011   cataract   EYE SURGERY Right 2012   Social History:  reports that he has never smoked. He has never used smokeless tobacco. He reports that he does not currently use alcohol. He reports that he does not use drugs. Family  History:  Family History  Problem Relation Age of Onset   Diabetes Mother    Cancer Father        leukemia    Obesity Other      HOME MEDICATIONS: Allergies as of 06/13/2022   No Known Allergies      Medication List        Accurate as of June 13, 2022 11:32 AM. If you have any questions, ask your nurse or doctor.          STOP taking these medications    benzonatate 100 MG capsule Commonly known as: TESSALON Stopped by: Dorita Sciara, MD   nirmatrelvir/ritonavir EUA 20 x 150 MG & 10 x '100MG'$  Tabs Commonly known as: PAXLOVID Stopped by: Dorita Sciara, MD       TAKE these medications    atorvastatin 10 MG tablet Commonly known as: LIPITOR Take 1 tablet (10 mg total) by mouth daily.   diazepam 2 MG tablet Commonly known as: Valium Take 1-2 by mouth prior to MRI procedure.   fluticasone 50 MCG/ACT nasal spray Commonly known as: FLONASE Place 1 spray into both nostrils daily.   ibuprofen 600 MG tablet Commonly known as: ADVIL Take 1 tablet (600 mg total) by mouth every 6 (six) hours as needed.   losartan 25 MG tablet Commonly known as: COZAAR Take 1 tablet (25 mg total) by mouth daily.   metFORMIN 500 MG tablet  Commonly known as: GLUCOPHAGE Take 1 tablet (500 mg total) by mouth 2 (two) times daily with a meal.   Prodigy No Coding Blood Gluc test strip Generic drug: glucose blood Use as instructed   sitaGLIPtin 100 MG tablet Commonly known as: Januvia Take 1 tablet (100 mg total) by mouth daily.   tadalafil 20 MG tablet Commonly known as: CIALIS Take 1 tablet (20 mg total) by mouth daily as needed for erectile dysfunction. PT NOT USING INSURANCE. WILL NOT COMPLETE PA. PT WILL PURCHASE OUT OF POCKET WITH GOOD RX.   Tyler Aas FlexTouch 200 UNIT/ML FlexTouch Pen Generic drug: insulin degludec Inject 70 Units into the skin daily. And pen needles 1/day         OBJECTIVE:   Vital Signs: BP 136/82 (BP Location: Left Arm, Patient  Position: Sitting, Cuff Size: Small)   Pulse 89   Ht '5\' 10"'$  (1.778 m)   Wt 236 lb (107 kg)   SpO2 99%   BMI 33.86 kg/m   Wt Readings from Last 3 Encounters:  06/13/22 236 lb (107 kg)  11/13/21 241 lb (109.3 kg)  07/30/20 241 lb (109.3 kg)     Exam: General: Pt appears well and is in NAD  Neck: General: Supple without adenopathy. Thyroid: Thyroid size normal.  No goiter or nodules appreciated.   Lungs: Clear with good BS bilat with no rales, rhonchi, or wheezes  Heart: RRR   Abdomen: Normoactive bowel sounds, soft, nontender, without masses or organomegaly palpable  Extremities: No pretibial edema.   Neuro: MS is good with appropriate affect, pt is alert and Ox3    DM foot exam: 06/13/2022  The skin of the feet is intact without sores or ulcerations. The pedal pulses are 2+ on right and 2+ on left. The sensation is absent to a screening 5.07, 10 gram monofilament bilaterally    DATA REVIEWED:  Lab Results  Component Value Date   HGBA1C 15.5 (H) 06/21/2020   HGBA1C >15 02/27/2020   HGBA1C 14.0 (A) 12/12/2019    Latest Reference Range & Units 11/13/21 20:11  Sodium 135 - 145 mmol/L 133 (L)  Potassium 3.5 - 5.1 mmol/L 4.2  Chloride 98 - 111 mmol/L 98  CO2 22 - 32 mmol/L 27  Glucose 70 - 99 mg/dL 425 (H)  BUN 6 - 20 mg/dL 15  Creatinine 0.61 - 1.24 mg/dL 0.88  Calcium 8.9 - 10.3 mg/dL 9.1  Anion gap 5 - 15  8  GFR, Estimated >60 mL/min >60    ASSESSMENT / PLAN / RECOMMENDATIONS:   1) Type 2 Diabetes Mellitus, poorly controlled, With Neuropathic complications - Most recent A1c of 14.5 %. Goal A1c <7.0%.     - Pt has not been to our clinic in 2 years and has not taken any of his glycemic agents in a while -I have discussed with the patient the pathophysiology of diabetes. We went over the natural progression of the disease. We talked about both insulin resistance and insulin deficiency. We stressed the importance of lifestyle changes. I explained the complications  associated with diabetes including retinopathy, nephropathy, neuropathy as well as increased risk of cardiovascular disease. We went over the benefit seen with glycemic control.   - I explained to the patient that diabetic patients are at higher than normal risk for amputations.  - Dexcom G7 sent and a sample was provided  - He was provided with a sample pen of ozempic 0.25 mcg , cautioned against GI side effects  -We will  restart insulin -Intolerant to metformin due to diarrhea -His in office BG 401 mg/DL, this is fasting.  I did explain to him that he most likely has been having BG is over 500 mg/DL postprandial  MEDICATIONS: Start Toujeo 30 units daily  Start Ozempic 0.25 mg weekly for 6 weeks, then increase to 0.5 mg weekly  EDUCATION / INSTRUCTIONS: BG monitoring instructions: Patient is instructed to check his blood sugars 3 times a day, before each meal. Call [place] [facility] if: BG persistently < 70  I reviewed the Rule of 15 for the treatment of hypoglycemia in detail with the patient. Literature supplied.   2) Diabetic complications:  Eye: Does not have known diabetic retinopathy.  Neuro/ Feet: Does  have known diabetic peripheral neuropathy .  Renal: Patient does not have known baseline CKD. He   is  on an ACEI/ARB at present.      F/U in 4  months     Signed electronically by: Mack Guise, MD  [facility] Endocrinology  [facility] [address] [place], [place] [address] Phone: [phone] FAX: [phone]   CC: Wendie Agreste, MD [address] [place] [address] Phone: [phone]  Fax: [phone]  Return to Endocrinology clinic as below: No future appointments.

## 2022-06-13 NOTE — Patient Instructions (Signed)
Start Toujeo 30 units daily  Start Ozempic 0.25 mg once weekly for 6 weeks, than increase to 0.5 mg weekly      HOW TO TREAT LOW BLOOD SUGARS (Blood sugar LESS THAN 70 MG/DL) Please follow the RULE OF 15 for the treatment of hypoglycemia treatment (when your (blood sugars are less than 70 mg/dL)   STEP 1: Take 15 grams of carbohydrates when your blood sugar is low, which includes:  3-4 GLUCOSE TABS  OR 3-4 OZ OF JUICE OR REGULAR SODA OR ONE TUBE OF GLUCOSE GEL    STEP 2: RECHECK blood sugar in 15 MINUTES STEP 3: If your blood sugar is still low at the 15 minute recheck --> then, go back to STEP 1 and treat AGAIN with another 15 grams of carbohydrates.

## 2022-10-29 IMAGING — DX DG CHEST 2V
2 series · 2 of 2 positions shown · non-contrast
Comparison: None.

CLINICAL DATA: Nonproductive cough for few weeks

EXAM:
CHEST - 2 VIEW

[chest pa]
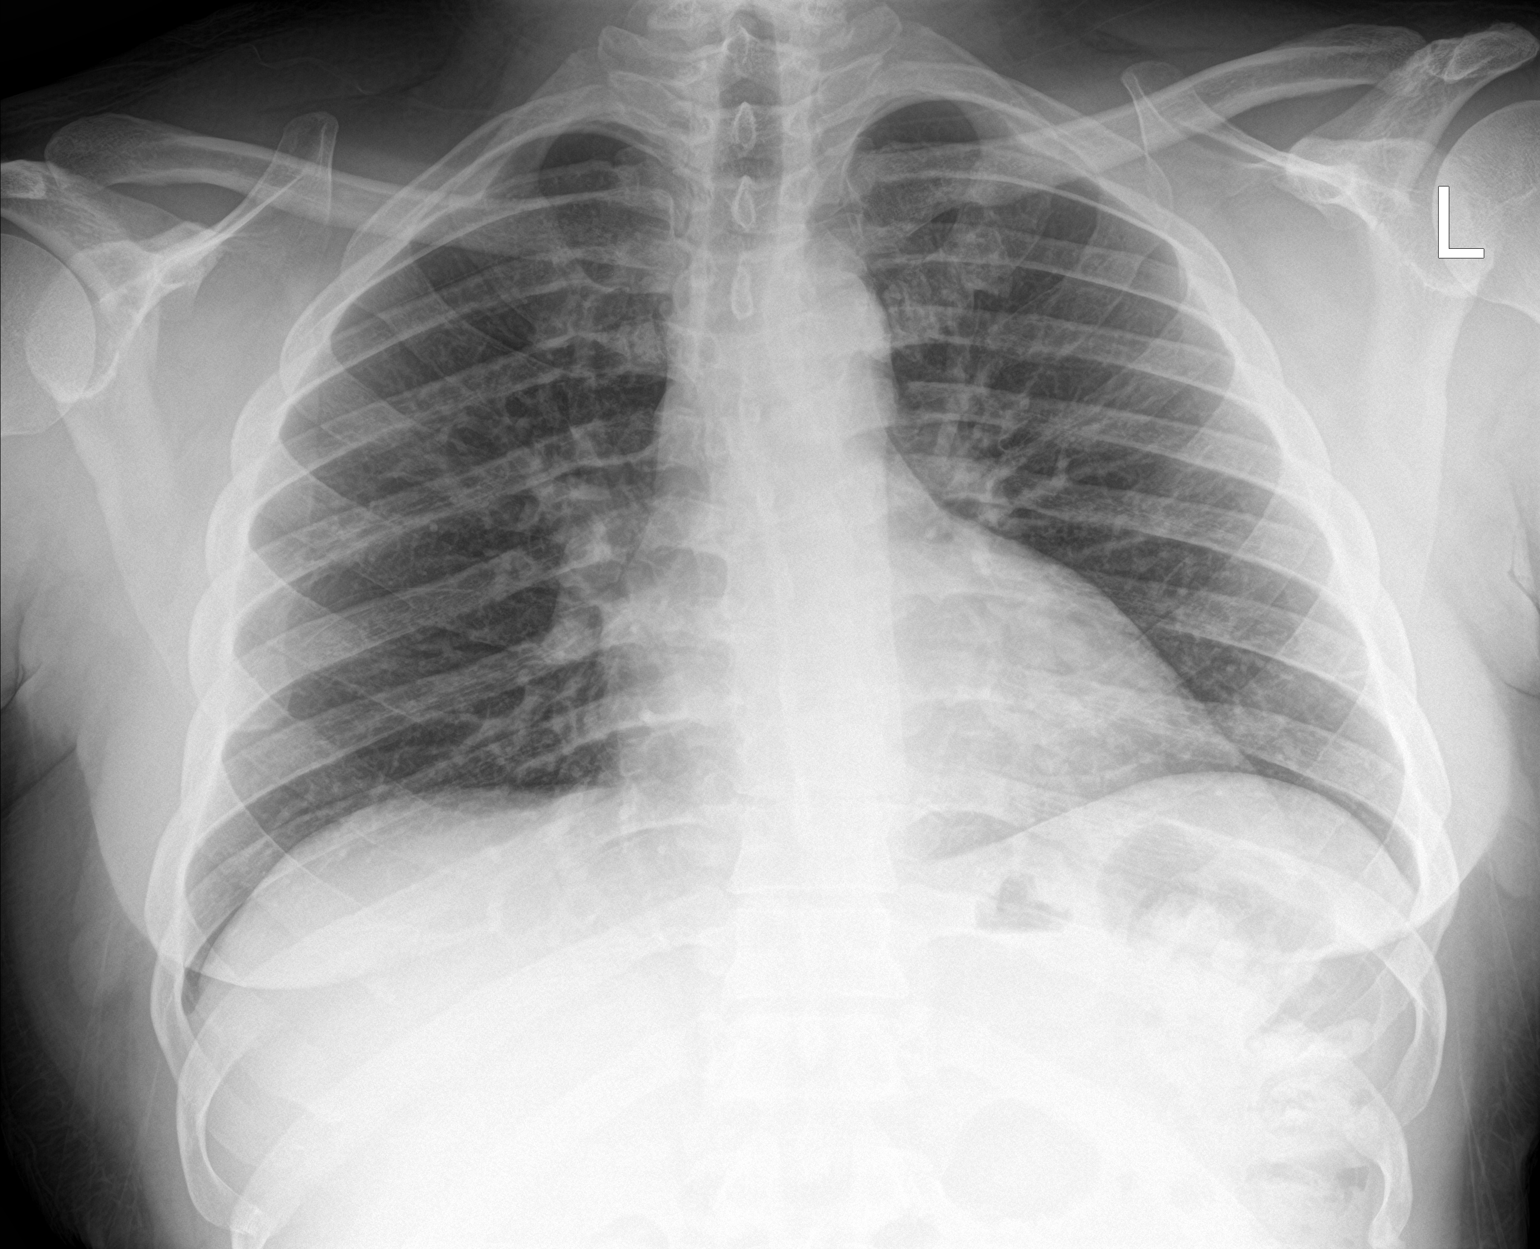

[chest lat]
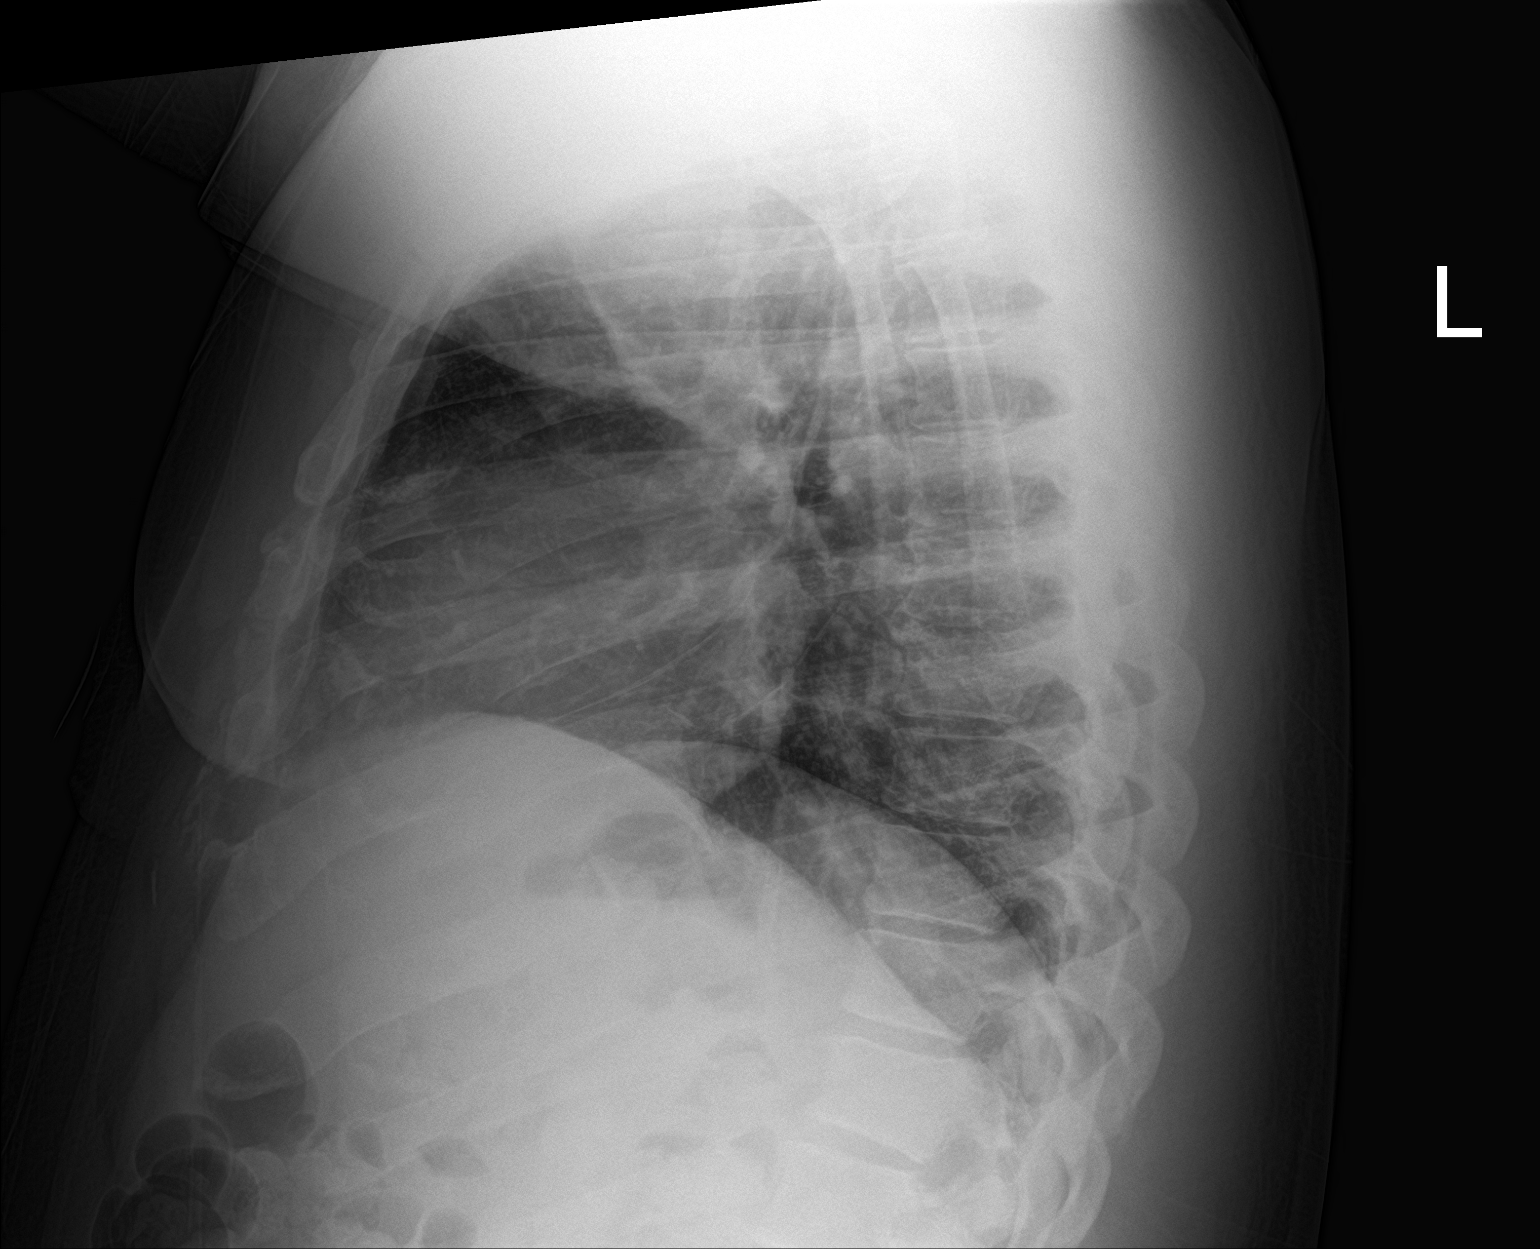

[2 of 2 positions shown; findings below may reference images not displayed]

FINDINGS: Normal heart size. Normal mediastinal contour. No pneumothorax. No
pleural effusion. Lungs appear clear, with no acute consolidative
airspace disease and no pulmonary edema.
IMPRESSION: No active cardiopulmonary disease.

## 2022-10-31 ENCOUNTER — Ambulatory Visit: Payer: Commercial Managed Care - PPO | Admitting: Internal Medicine

## 2022-10-31 ENCOUNTER — Ambulatory Visit
Admission: EM | Admit: 2022-10-31 | Discharge: 2022-10-31 | Disposition: A | Discharge status: Home / Self Care | Payer: Commercial Managed Care - PPO

## 2022-10-31 DIAGNOSIS — K029 Dental caries, unspecified: Secondary | ICD-10-CM | POA: Diagnosis not present

## 2022-10-31 MED ORDER — AMOXICILLIN-POT CLAVULANATE 875-125 MG PO TABS: 1.0000 | ORAL_TABLET | Freq: Two times a day (BID) | ORAL | 0 refills | Status: AC

## 2022-10-31 NOTE — ED Provider Notes (Signed)
[facility]    CSN: [id number] Arrival date & time: 10/31/22  1831      History   Chief Complaint Chief Complaint  Patient presents with   Dental Pain    Top RT    HPI Ian Hughes is a 41 y.o. male.   HPI 41 year old male presents with dental pain right upper since Friday, 10/27/2022.  PMH significant for morbid obesity, S5KC without complication, and HLD.  Past Medical History:  Diagnosis Date   Cataract 2011   surgery on R eye   Diabetes mellitus without complication ([place])    High cholesterol    Obesity     Patient Active Problem List   Diagnosis Date Noted   Type 2 diabetes mellitus with diabetic polyneuropathy, with long-term current use of insulin ([place]) 06/13/2022   Type 2 diabetes mellitus with hyperglycemia, with long-term current use of insulin ([place]) 06/13/2022   Type 2 diabetes mellitus with hyperosmolarity without coma, with long-term current use of insulin ([place]) 02/11/2020   Glucosuria 02/11/2020   Non-restorative sleep 02/11/2020   Excessive daytime sleepiness 02/11/2020   Sleep related headaches 02/11/2020   Nocturia more than twice per night 02/11/2020   UARS (upper airway resistance syndrome) 04/07/2015   Severe obesity (BMI >= 40) ([place]) 04/07/2015   Primary snoring 04/07/2015   Snoring 12/21/2014   Drowsiness 12/21/2014   Witnessed apneic spells 12/21/2014   ED (erectile dysfunction) 09/24/2012   Diabetes mellitus type 2 in obese ([place]) 07/04/2009    Class: Chronic   OBESITY, NOS 01/17/2007   BELLS PALSY 01/17/2007   HYPERTENSION, BENIGN SYSTEMIC 01/17/2007    Past Surgical History:  Procedure Laterality Date   EYE SURGERY Left 2011   cataract   EYE SURGERY Right 2012       Home Medications    Prior to Admission medications   Medication Sig Start Date End Date Taking? Authorizing Provider  metformin (FORTAMET) 500 MG (OSM) 24 hr tablet Take 500 mg by mouth 2 (two) times daily with a meal.   Yes [provider]   amoxicillin-clavulanate (AUGMENTIN) 875-125 MG tablet Take 1 tablet by mouth every 12 (twelve) hours for 10 days. 10/31/22 11/10/22 Yes Eliezer Lofts, FNP  atorvastatin (LIPITOR) 10 MG tablet Take 1 tablet (10 mg total) by mouth daily. Patient not taking: Reported on 06/13/2022 02/11/20   Wendie Agreste, MD  Continuous Blood Gluc Sensor (DEXCOM G7 SENSOR) MISC 1 Device by Does not apply route as directed. 06/13/22   Shamleffer, Melanie Crazier, MD  diazepam (VALIUM) 2 MG tablet Take 1-2 by mouth prior to MRI procedure. Patient not taking: Reported on 06/13/2022 02/11/20   Wendie Agreste, MD  fluticasone [facility]) 50 MCG/ACT nasal spray Place 1 spray into both nostrils daily. Patient not taking: Reported on 06/13/2022 12/08/19   Augusto Gamble B, NP  glucose blood (PRODIGY NO CODING BLOOD GLUC) test strip Use as instructed Patient not taking: Reported on 06/13/2022 02/11/20   Wendie Agreste, MD  ibuprofen (ADVIL) 600 MG tablet Take 1 tablet (600 mg total) by mouth every 6 (six) hours as needed. Patient not taking: Reported on 06/13/2022 10/06/19   Wieters, Hallie C, PA-C  insulin glargine, 2 Unit Dial, (TOUJEO MAX SOLOSTAR) 300 UNIT/ML Solostar Pen Inject 30 Units into the skin daily in the afternoon. 06/13/22   Shamleffer, Melanie Crazier, MD  Insulin Pen Needle 32G X 4 MM MISC 1 Device by Does not apply route daily in the afternoon. 06/13/22   Shamleffer,  Melanie Crazier, MD  losartan (COZAAR) 25 MG tablet Take 1 tablet (25 mg total) by mouth daily. Patient not taking: Reported on 06/13/2022 01/24/22   Vanessa Kick, MD  Semaglutide,0.25 or 0.'5MG'$ /DOS, 2 MG/3ML SOPN Inject 0.5 mg into the skin once a week. 06/13/22   Shamleffer, Melanie Crazier, MD  tadalafil (CIALIS) 20 MG tablet Take 1 tablet (20 mg total) by mouth daily as needed for erectile dysfunction. PT NOT USING INSURANCE. WILL NOT COMPLETE PA. PT WILL PURCHASE OUT OF POCKET WITH GOOD RX. 04/01/20   Renato Shin, MD  bromocriptine (PARLODEL)  2.5 MG tablet 1/4 tab daily Patient not taking: Reported on 03/12/2019 01/11/18 10/06/19  Renato Shin, MD  glimepiride (AMARYL) 1 MG tablet Take 0.5 tablets (0.5 mg total) by mouth daily with breakfast. Patient not taking: Reported on 03/12/2019 01/11/18 10/06/19  Renato Shin, MD    Family History Family History  Problem Relation Age of Onset   Diabetes Mother    Cancer Father        leukemia    Obesity Other     Social History Social History   Tobacco Use   Smoking status: Never   Smokeless tobacco: Never  Vaping Use   Vaping Use: Never used  Substance Use Topics   Alcohol use: Not Currently    Alcohol/week: 0.0 standard drinks of alcohol    Comment: 1 or 2 every 3 months    Drug use: No     Allergies   Patient has no known allergies.   Review of Systems Review of Systems  HENT:  Positive for dental problem.   All other systems reviewed and are negative.    Physical Exam Triage Vital Signs ED Triage Vitals  Enc Vitals Group     BP 10/31/22 1903 (!) 171/104     Pulse Rate 10/31/22 1903 85     Resp 10/31/22 1901 17     Temp 10/31/22 1903 98.6 F (37 C)     Temp Source 10/31/22 1901 Oral     SpO2 10/31/22 1903 100 %     Weight --      Height --      Head Circumference --      Peak Flow --      Pain Score 10/31/22 1902 8     Pain Loc --      Pain Edu? --      Excl. in [place]? --    No data found.  Updated Vital Signs BP (!) 171/104 (BP Location: Right Arm)   Pulse 85   Temp 98.6 F (37 C) (Oral)   Resp 17   SpO2 100%       Physical Exam Vitals and nursing note reviewed.  Constitutional:      Appearance: Normal appearance. He is obese.  HENT:     Head: Normocephalic and atraumatic.     Mouth/Throat:     Mouth: Mucous membranes are moist.     Pharynx: Oropharynx is clear.  Eyes:     Extraocular Movements: Extraocular movements intact.     Conjunctiva/sclera: Conjunctivae normal.     Pupils: Pupils are equal, round, and reactive to light.      Comments: Right upper: Missing premolar, first molar and third molar with significant dental caries; erythematous gingival medial/lateral border noted  Cardiovascular:     Rate and Rhythm: Normal rate and regular rhythm.     Pulses: Normal pulses.     Heart sounds: Normal heart sounds.  Pulmonary:  Effort: Pulmonary effort is normal.     Breath sounds: Normal breath sounds. No wheezing, rhonchi or rales.  Musculoskeletal:        General: Normal range of motion.     Cervical back: Normal range of motion and neck supple.  Skin:    General: Skin is warm and dry.  Neurological:     General: No focal deficit present.     Mental Status: He is alert and oriented to person, place, and time.      UC Treatments / Results  Labs (all labs ordered are listed, but only abnormal results are displayed) Labs Reviewed - No data to display  EKG   Radiology No results found.  Procedures Procedures (including critical care time)  Medications Ordered in UC Medications - No data to display  Initial Impression / Assessment and Plan / UC Course  I have reviewed the triage vital signs and the nursing notes.  Pertinent labs & imaging results that were available during my care of the patient were reviewed by me and considered in my medical decision making (see chart for details).     MDM: 1.  Pain due to dental caries-Rx'd Augmentin. Advised patient to take medication as directed with food to completion.  Encouraged increase daily water intake to 64 ounces per day while taking this medication.  Advised patient to follow-up with dentist this week for further evaluation of dental caries of right upper.  Patient discharged home, hemodynamically stable. Final Clinical Impressions(s) / UC Diagnoses   Final diagnoses:  Pain due to dental caries     Discharge Instructions      Advised patient to take medication as directed with food to completion.  Encouraged increase daily water intake to  64 ounces per day while taking this medication.  Advised patient to follow-up with dentist this week for further evaluation of dental caries of right upper.     ED Prescriptions     Medication Sig Dispense Auth. Provider   amoxicillin-clavulanate (AUGMENTIN) 875-125 MG tablet Take 1 tablet by mouth every 12 (twelve) hours for 10 days. 20 tablet Eliezer Lofts, FNP      PDMP not reviewed this encounter.   Eliezer Lofts, [place] 10/31/22 2022

## 2022-10-31 NOTE — Discharge Instructions (Addendum)
Advised patient to take medication as directed with food to completion.  Encouraged increase daily water intake to 64 ounces per day while taking this medication.  Advised patient to follow-up with dentist this week for further evaluation of dental caries of right upper.

## 2022-10-31 NOTE — Progress Notes (Deleted)
Name: Ian Hughes  Age/ Sex: 41 y.o., male   MRN/ DOB: [id number], [birth year]     PCP: Wendie Agreste, MD   Reason for Endocrinology Evaluation: Type 2 Diabetes Mellitus  Initial Endocrine Consultative Visit: 07/10/2017    PATIENT IDENTIFIER: Ian Hughes is a 41 y.o. male with a past medical history of T2DM, dyslipidemia, HTN. The patient has followed with Endocrinology clinic since 07/10/2017 for consultative assistance with management of his diabetes.  DIABETIC HISTORY:  [name] was diagnosed with DM in 2009, he was started on insulin in 2019 , Metformin caused diarrhea . His hemoglobin A1c has ranged from 7.1% in 2016, peaking at 15.5% in 2021.  He last saw Dr. Loanne Drilling in September 2021  Upon his return to our clinic 05/2022, his A1c was 14.5%, he was not taking his glycemic agents.  We started him on basal insulin and Ozempic  SUBJECTIVE:   During the last visit (06/13/2022): A1c 14.5%  Today (10/31/2022): [name]  is here for a diabetes management. He checks his blood sugars 0 times daily. Patient presented to the ED in December 2022 with hyperglycemia  Denies nausea, vomiting or diarrhea  Has noted feet feel weird   HOME DIABETES REGIMEN:  Ozempic 0.5 mg weekly Toujeo 30 units daily     Statin: Yes ACE-I/ARB: Yes    METER DOWNLOAD SUMMARY: does not check     DIABETIC COMPLICATIONS: Microvascular complications:   Denies: CKD, neuropathy  Last Eye Exam: Completed years ago   Macrovascular complications:   Denies: CAD, CVA, PVD   HISTORY:  Past Medical History:  Past Medical History:  Diagnosis Date   Cataract 2011   surgery on R eye   Diabetes mellitus without complication ([place])    High cholesterol    Obesity    Past Surgical History:  Past Surgical History:  Procedure Laterality Date   EYE SURGERY Left 2011   cataract   EYE SURGERY Right 2012   Social History:  reports that he has never smoked. He has never used smokeless  tobacco. He reports that he does not currently use alcohol. He reports that he does not use drugs. Family History:  Family History  Problem Relation Age of Onset   Diabetes Mother    Cancer Father        leukemia    Obesity Other      HOME MEDICATIONS: Allergies as of 10/31/2022   No Known Allergies      Medication List        Accurate as of October 31, 2022  7:14 AM. If you have any questions, ask your nurse or doctor.          atorvastatin 10 MG tablet Commonly known as: LIPITOR Take 1 tablet (10 mg total) by mouth daily.   Dexcom G7 Sensor Misc 1 Device by Does not apply route as directed.   diazepam 2 MG tablet Commonly known as: Valium Take 1-2 by mouth prior to MRI procedure.   fluticasone 50 MCG/ACT nasal spray Commonly known as: FLONASE Place 1 spray into both nostrils daily.   ibuprofen 600 MG tablet Commonly known as: ADVIL Take 1 tablet (600 mg total) by mouth every 6 (six) hours as needed.   Insulin Pen Needle 32G X 4 MM Misc 1 Device by Does not apply route daily in the afternoon.   losartan 25 MG tablet Commonly known as: COZAAR Take 1 tablet (25 mg total) by mouth daily.   Prodigy  No Coding Blood Gluc test strip Generic drug: glucose blood Use as instructed   Semaglutide(0.25 or 0.'5MG'$ /DOS) 2 MG/3ML Sopn Inject 0.5 mg into the skin once a week.   tadalafil 20 MG tablet Commonly known as: CIALIS Take 1 tablet (20 mg total) by mouth daily as needed for erectile dysfunction. PT NOT USING INSURANCE. WILL NOT COMPLETE PA. PT WILL PURCHASE OUT OF POCKET WITH GOOD RX.   Toujeo Max SoloStar 300 UNIT/ML Solostar Pen Generic drug: insulin glargine (2 Unit Dial) Inject 30 Units into the skin daily in the afternoon.         OBJECTIVE:   Vital Signs: There were no vitals taken for this visit.  Wt Readings from Last 3 Encounters:  06/13/22 236 lb (107 kg)  11/13/21 241 lb (109.3 kg)  07/30/20 241 lb (109.3 kg)     Exam: General: Pt  appears well and is in NAD  Neck: General: Supple without adenopathy. Thyroid: Thyroid size normal.  No goiter or nodules appreciated.   Lungs: Clear with good BS bilat with no rales, rhonchi, or wheezes  Heart: RRR   Abdomen: Normoactive bowel sounds, soft, nontender, without masses or organomegaly palpable  Extremities: No pretibial edema.   Neuro: MS is good with appropriate affect, pt is alert and Ox3    DM foot exam: 06/13/2022  The skin of the feet is intact without sores or ulcerations. The pedal pulses are 2+ on right and 2+ on left. The sensation is absent to a screening 5.07, 10 gram monofilament bilaterally    DATA REVIEWED:  Lab Results  Component Value Date   HGBA1C 14.5 (A) 06/13/2022   HGBA1C 15.5 (H) 06/21/2020   HGBA1C >15 02/27/2020    Latest Reference Range & Units 11/13/21 20:11  Sodium 135 - 145 mmol/L 133 (L)  Potassium 3.5 - 5.1 mmol/L 4.2  Chloride 98 - 111 mmol/L 98  CO2 22 - 32 mmol/L 27  Glucose 70 - 99 mg/dL 425 (H)  BUN 6 - 20 mg/dL 15  Creatinine 0.61 - 1.24 mg/dL 0.88  Calcium 8.9 - 10.3 mg/dL 9.1  Anion gap 5 - 15  8  GFR, Estimated >60 mL/min >60    ASSESSMENT / PLAN / RECOMMENDATIONS:   1) Type 2 Diabetes Mellitus, poorly controlled, With Neuropathic complications - Most recent A1c of 14.5 %. Goal A1c <7.0%.     - Dexcom G7 sent and a sample was provided  -Intolerant to metformin due to diarrhea    MEDICATIONS: Start Toujeo 30 units daily  Start Ozempic 0.25 mg weekly for 6 weeks, then increase to 0.5 mg weekly  EDUCATION / INSTRUCTIONS: BG monitoring instructions: Patient is instructed to check his blood sugars 3 times a day, before each meal. Call [place] [facility] if: BG persistently < 70  I reviewed the Rule of 15 for the treatment of hypoglycemia in detail with the patient. Literature supplied.   2) Diabetic complications:  Eye: Does not have known diabetic retinopathy.  Neuro/ Feet: Does  have known  diabetic peripheral neuropathy .  Renal: Patient does not have known baseline CKD. He   is  on an ACEI/ARB at present.      F/U in 4  months     Signed electronically by: Mack Guise, MD  [facility] Endocrinology  [facility] [address] [place], [place] [address] Phone: [phone] FAX: [phone]   CC: Wendie Agreste, MD [address] [place]  [place] Phone: [phone]  Fax: [phone]  Return to Endocrinology clinic as below: Future Appointments  Date Time Provider [place]  10/31/2022 10:10 AM Kailin Principato, Melanie Crazier, MD [id number] None

## 2022-10-31 NOTE — ED Triage Notes (Signed)
Pt c/o dental pain to top RT of mouth since Friday. Ibuprofen and tylenol prn.

## 2023-02-09 NOTE — Progress Notes (Unsigned)
Subjective:    Ian Hughes - 42 y.o. male MRN [id number]  Date of birth: 1981/04/21  HPI  Ian Hughes is to establish care. He is accompanied by his fiance, Zuciah.   Current issues and/or concerns: - Patient reports he has not taken diabetes medications in 7 months. He was previously taking Toujeo, Metformin, and Semaglutide. Reports Toujeo was $700 out of pocket cost after health insurance. Reports he did not have financial barriers to obtaining Metformin and Semaglutide.  He was established with [facility] and his last appointment was on 06/13/2022. Reports he does not monitor carb and sugar intake. He does not exercise outside of normal routine. He does not check his blood sugars in the home setting. Reports he is experiencing bilateral hand numbness. - Patient reports he has not taken Losartan for blood pressure and Atorvastatin for cholesterol in more than 7 months. Reports he did not have financial barriers to obtaining the same. He does not check his blood pressure in the home setting. He denies red flag symptoms such as but not limited to chest pain, shortness of breath, worst headache of life, nausea/vomiting.  - Patient reports erectile dysfunction persisting. Cialis ineffective in the past. Sildenafil was not covered at a reasonable cost after health insurance.  - No further issues/concerns for discussion today.    ROS per HPI   Health Maintenance:  Health Maintenance Due  Topic Date Due   OPHTHALMOLOGY EXAM  08/01/2016   Diabetic kidney evaluation - Urine ACR  12/11/2020   INFLUENZA VACCINE  06/20/2022   COVID-19 Vaccine (3 - 2023-24 season) 07/21/2022   Diabetic kidney evaluation - eGFR measurement  11/13/2022    Past Medical History: Patient Active Problem List   Diagnosis Date Noted   Type 2 diabetes mellitus with diabetic polyneuropathy, with long-term current use of insulin ([place]) 06/13/2022   Type 2 diabetes mellitus with hyperglycemia,  with long-term current use of insulin ([place]) 06/13/2022   Type 2 diabetes mellitus with hyperosmolarity without coma, with long-term current use of insulin ([place]) 02/11/2020   Glucosuria 02/11/2020   Non-restorative sleep 02/11/2020   Excessive daytime sleepiness 02/11/2020   Sleep related headaches 02/11/2020   Nocturia more than twice per night 02/11/2020   UARS (upper airway resistance syndrome) 04/07/2015   Severe obesity (BMI >= 40) ([place]) 04/07/2015   Primary snoring 04/07/2015   Snoring 12/21/2014   Drowsiness 12/21/2014   Witnessed apneic spells 12/21/2014   ED (erectile dysfunction) 09/24/2012   Diabetes mellitus type 2 in obese ([place]) 07/04/2009    Class: Chronic   OBESITY, NOS 01/17/2007   BELLS PALSY 01/17/2007   HYPERTENSION, BENIGN SYSTEMIC 01/17/2007      Social History   reports that he has never smoked. He has never been exposed to tobacco smoke. He has never used smokeless tobacco. He reports current alcohol use. He reports that he does not use drugs.   Family History  family history includes Cancer in his father; Diabetes in his mother; Obesity in an other family member.   Medications: reviewed and updated   Objective:   Physical Exam BP (!) 168/82   Pulse 81   Temp 98.3 F (36.8 C)   Resp 16   Ht 5\' 11"  (1.803 m)   Wt 240 lb (108.9 kg)   SpO2 98%   BMI 33.47 kg/m   Physical Exam HENT:     Head: Normocephalic and atraumatic.  Eyes:     Extraocular Movements: Extraocular movements intact.  Conjunctiva/sclera: Conjunctivae normal.     Pupils: Pupils are equal, round, and reactive to light.  Cardiovascular:     Rate and Rhythm: Normal rate and regular rhythm.     Pulses: Normal pulses.     Heart sounds: Normal heart sounds.  Pulmonary:     Effort: Pulmonary effort is normal.     Breath sounds: Normal breath sounds.  Musculoskeletal:     Cervical back: Normal range of motion and neck supple.  Neurological:     General: No focal deficit  present.     Mental Status: He is alert and oriented to person, place, and time.  Psychiatric:        Mood and Affect: Mood normal.        Behavior: Behavior normal.     Results for orders placed or performed in visit on 02/14/23  POCT glycosylated hemoglobin (Hb A1C)  Result Value Ref Range   Hemoglobin A1C 15.0 (A) 4.0 - 5.6 %   HbA1c POC (<> result, manual entry)     HbA1c, POC (prediabetic range)     HbA1c, POC (controlled diabetic range)         Assessment & Plan:  1. Encounter to establish care - Patient presents today to establish care. During the interim follow-up with primary provider as scheduled.  - Return for annual physical examination, labs, and health maintenance. Arrive fasting meaning having no food for at least 8 hours prior to appointment. You may have only water or black coffee. Please take scheduled medications as normal.  2. Nonadherence to medication - Patient confirms diabetes, hypertension, and cholesterol medications nonadherence for the past 7 months or more.   3. Uncontrolled type 2 diabetes mellitus with hyperglycemia, with long-term current use of insulin ([place]) 4. Diabetic mononeuropathy associated with type 2 diabetes mellitus (HCC) - Hemoglobin A1c not at goal at 15.0%, goal 7%. This is increased compared to previous 14.5%.  - Metformin as prescribed. Counseled on medication adherence/adverse effects.  - Insulin Glargine as prescribed. Counseled on medication adherence/adverse effects.  - Duloxetine as prescribed. Counseled on medication adherence/adverse effects. - Routine screening.  - Discussed the importance of healthy eating habits, low-carbohydrate diet, low-sugar diet, regular aerobic exercise (at least 150 minutes a week as tolerated) and medication compliance to achieve or maintain control of diabetes. - To achieve an A1C goal of less than or equal to 7.0 percent, a fasting blood sugar of 80 to 130 mg/dL and a postprandial glucose (90 to 120  minutes after a meal) less than 180 mg/dL. In the event of sugars less than 60 mg/dl or greater than 400 mg/dl please notify the clinic ASAP. - Follow-up with clinical pharmacist in 4 weeks or sooner if needed for diabetes checkup. Write your home blood sugar results down each day and bring those results to your appointment along with your home glucose monitor. Medications may be revised at that time if needed. - Referral to Medical Nutrition Therapy for further evaluation/management. - Referral to Endocrinology for further evaluation/management.  - Follow-up with primary provider as scheduled.  - POCT glycosylated hemoglobin (Hb 123XX123) - Basic Metabolic Panel - Ambulatory referral to Endocrinology - DULoxetine (CYMBALTA) 20 MG capsule; Take 1 capsule (20 mg total) by mouth daily.  Dispense: 30 capsule; Refill: 0 - metFORMIN (GLUCOPHAGE-XR) 500 MG 24 hr tablet; Take 1 tablet (500 mg total) by mouth daily with breakfast.  Dispense: 30 tablet; Refill: 2 - Insulin Glargine (BASAGLAR KWIKPEN) 100 UNIT/ML; Inject 10 Units into the  skin at bedtime.  Dispense: 15 mL; Refill: 0 - glucose blood (TRUE METRIX BLOOD GLUCOSE TEST) test strip; Use as instructed  Dispense: 100 each; Refill: 12 - TRUEplus Lancets 28G MISC; Use as directed  Dispense: 100 each; Refill: 4 - Blood Glucose Monitoring Suppl (TRUE METRIX METER) w/Device KIT; Use as directed  Dispense: 1 kit; Refill: 0 - Amb ref to Medical Nutrition Therapy-MNT  5. Primary hypertension - Blood pressure not at goal during today's visit. Patient asymptomatic without chest pressure, chest pain, palpitations, shortness of breath, worst headache of life, and any additional red flag symptoms. - Losartan as prescribed. Counseled on medication adherence/adverse effects.  - Counseled on blood pressure goal of less than 130/80, low-sodium, DASH diet, medication compliance, and 150 minutes of moderate intensity exercise per week as tolerated. Counseled on medication  adherence and adverse effects. - Follow-up with clinical pharmacist in 1 to 2 weeks or sooner if needed for hypertension checkup. Write your home blood sugar results down each day and bring those results to your appointment along with your home glucose monitor. Medications may be revised at that time if needed. - Follow-up with primary provider as scheduled.  - losartan (COZAAR) 25 MG tablet; Take 1 tablet (25 mg total) by mouth daily.  Dispense: 90 tablet; Refill: 0  6. Hyperlipidemia, unspecified hyperlipidemia type - Practice low-fat heart healthy diet and at least 150 minutes of moderate intensity exercise weekly as tolerated.  - Atorvastatin as prescribed. Counseled on medication adherence/adverse effects.  - Follow-up with primary provider as scheduled.  - atorvastatin (LIPITOR) 10 MG tablet; Take 1 tablet (10 mg total) by mouth daily.  Dispense: 90 tablet; Refill: 0  7. Erectile dysfunction, unspecified erectile dysfunction type - Referral to Urology for further evaluation/management.  - Ambulatory referral to Urology   Patient was given clear instructions to go to Emergency Department or return to medical center if symptoms don't improve, worsen, or new problems develop.The patient verbalized understanding.  I discussed the assessment and treatment plan with the patient. The patient was provided an opportunity to ask questions and all were answered. The patient agreed with the plan and demonstrated an understanding of the instructions.   The patient was advised to call back or seek an in-person evaluation if the symptoms worsen or if the condition fails to improve as anticipated.    Durene Fruits, NP 02/14/2023, 9:53 AM Primary Care at [facility]

## 2023-02-14 ENCOUNTER — Encounter: Payer: Self-pay | Admitting: Family

## 2023-02-14 ENCOUNTER — Ambulatory Visit (INDEPENDENT_AMBULATORY_CARE_PROVIDER_SITE_OTHER): Payer: Commercial Managed Care - PPO | Admitting: Family

## 2023-02-14 VITALS — BP 168/82 | HR 81 | Temp 98.3°F | Resp 16 | Ht 71.0 in | Wt 240.0 lb

## 2023-02-14 DIAGNOSIS — E1165 Type 2 diabetes mellitus with hyperglycemia: Secondary | ICD-10-CM

## 2023-02-14 DIAGNOSIS — I10 Essential (primary) hypertension: Secondary | ICD-10-CM | POA: Diagnosis not present

## 2023-02-14 DIAGNOSIS — Z7689 Persons encountering health services in other specified circumstances: Secondary | ICD-10-CM

## 2023-02-14 DIAGNOSIS — Z91148 Patient's other noncompliance with medication regimen for other reason: Secondary | ICD-10-CM

## 2023-02-14 DIAGNOSIS — Z794 Long term (current) use of insulin: Secondary | ICD-10-CM

## 2023-02-14 DIAGNOSIS — E1141 Type 2 diabetes mellitus with diabetic mononeuropathy: Secondary | ICD-10-CM | POA: Diagnosis not present

## 2023-02-14 DIAGNOSIS — E785 Hyperlipidemia, unspecified: Secondary | ICD-10-CM | POA: Diagnosis not present

## 2023-02-14 DIAGNOSIS — N529 Male erectile dysfunction, unspecified: Secondary | ICD-10-CM

## 2023-02-14 LAB — POCT GLYCOSYLATED HEMOGLOBIN (HGB A1C): Hemoglobin A1C: 15 % — AB (ref 4.0–5.6)

## 2023-02-14 MED ORDER — BASAGLAR KWIKPEN 100 UNIT/ML ~~LOC~~ SOPN: 10.0000 [IU] | PEN_INJECTOR | Freq: Every day | SUBCUTANEOUS | 0 refills | Status: DC

## 2023-02-14 MED ORDER — TRUE METRIX BLOOD GLUCOSE TEST VI STRP: ORAL_STRIP | 12 refills | Status: DC

## 2023-02-14 MED ORDER — ATORVASTATIN CALCIUM 10 MG PO TABS: 10.0000 mg | ORAL_TABLET | Freq: Every day | ORAL | 0 refills | Status: DC

## 2023-02-14 MED ORDER — LOSARTAN POTASSIUM 25 MG PO TABS: 25.0000 mg | ORAL_TABLET | Freq: Every day | ORAL | 0 refills | Status: DC

## 2023-02-14 MED ORDER — TRUE METRIX METER W/DEVICE KIT: PACK | 0 refills | Status: DC

## 2023-02-14 MED ORDER — DULOXETINE HCL 20 MG PO CPEP: 20.0000 mg | ORAL_CAPSULE | Freq: Every day | ORAL | 0 refills | Status: DC

## 2023-02-14 MED ORDER — TRUEPLUS LANCETS 28G MISC: 4 refills | Status: DC

## 2023-02-14 MED ORDER — METFORMIN HCL ER 500 MG PO TB24: 500.0000 mg | ORAL_TABLET | Freq: Every day | ORAL | 2 refills | Status: DC

## 2023-02-14 NOTE — Patient Instructions (Signed)
Thank you for choosing Primary Care at [facility] for your medical home!    Ian Hughes was seen by Camillia Herter, NP today.   Peter Garter primary care provider is Durene Fruits, NP.   For the best care possible,  you should try to see Durene Fruits, NP whenever you come to office.   We look forward to seeing you again soon!  If you have any questions about your visit today,  please call us at [phone]  Or feel free to reach your provider via [place].   Keeping you healthy   Get these tests Blood pressure- Have your blood pressure checked once a year by your healthcare provider.  Normal blood pressure is 120/80. Weight- Have your body mass index (BMI) calculated to screen for obesity.  BMI is a measure of body fat based on height and weight. You can also calculate your own BMI at [employer]velBags.it. Cholesterol- Have your cholesterol checked regularly starting at age 10, sooner may be necessary if you have diabetes, high blood pressure, if a family member developed heart diseases at an early age or if you smoke.  Chlamydia, HIV, and other sexual transmitted disease- Get screened each year until the age of 14 then within three months of each new sexual partner. Diabetes- Have your blood sugar checked regularly if you have high blood pressure, high cholesterol, a family history of diabetes or if you are overweight.   Get these vaccines Flu shot- Every fall. Tetanus shot- Every 10 years. Menactra- Single dose; prevents meningitis.   Take these steps Don't smoke- If you do smoke, ask your healthcare provider about quitting. For tips on how to quit, go to www.smokefree.gov or call 1-800-QUIT-NOW. Be physically active- Exercise 5 days a week for at least 30 minutes.  If you are not already physically active start slow and gradually work up to 30 minutes of moderate physical activity.  Examples of moderate activity include walking briskly, mowing the yard, dancing,  swimming bicycling, etc. Eat a healthy diet- Eat a variety of healthy foods such as fruits, vegetables, low fat milk, low fat cheese, yogurt, lean meats, poultry, fish, beans, tofu, etc.  For more information on healthy eating, go to www.thenutritionsource.org Drink alcohol in moderation- Limit alcohol intake two drinks or less a day.  Never drink and drive. Dentist- Brush and floss teeth twice daily; visit your dentis twice a year. Depression-Your emotional health is as important as your physical health.  If you're feeling down, losing interest in things you normally enjoy please talk with your healthcare provider. Gun Safety- If you keep a gun in your home, keep it unloaded and with the safety lock on.  Bullets should be stored separately. Helmet use- Always wear a helmet when riding a motorcycle, bicycle, rollerblading or skateboarding. Safe sex- If you may be exposed to a sexually transmitted infection, use a condom Seat belts- Seat bels can save your life; always wear one. Smoke/Carbon Monoxide detectors- These detectors need to be installed on the appropriate level of your home.  Replace batteries at least once a year. Skin Cancer- When out in the sun, cover up and use sunscreen SPF 15 or higher. Violence- If anyone is threatening or hurting you, please tell your healthcare provider.

## 2023-02-14 NOTE — Progress Notes (Signed)
.  Pt presents to establish care, present in room with pt is fiance Merilynn [place] -has not been seen by physician in over year in regards to diabetes and hypertension -not taking any medication

## 2023-02-16 LAB — BASIC METABOLIC PANEL
BUN/Creatinine Ratio: 12 (ref 9–20)
BUN: 10 mg/dL (ref 6–24)
CO2: 23 mmol/L (ref 20–29)
Calcium: 9 mg/dL (ref 8.7–10.2)
Chloride: 96 mmol/L (ref 96–106)
Creatinine, Ser: 0.83 mg/dL (ref 0.76–1.27)
Glucose: 464 mg/dL — ABNORMAL HIGH (ref 70–99)
Potassium: 4.3 mmol/L (ref 3.5–5.2)
Sodium: 134 mmol/L (ref 134–144)
eGFR: 113 mL/min/{1.73_m2} (ref >59)

## 2023-02-19 ENCOUNTER — Ambulatory Visit: Payer: Self-pay | Admitting: *Deleted

## 2023-02-19 ENCOUNTER — Other Ambulatory Visit: Payer: Self-pay | Admitting: Family

## 2023-02-19 DIAGNOSIS — E1165 Type 2 diabetes mellitus with hyperglycemia: Secondary | ICD-10-CM

## 2023-02-19 DIAGNOSIS — E1141 Type 2 diabetes mellitus with diabetic mononeuropathy: Secondary | ICD-10-CM

## 2023-02-19 DIAGNOSIS — E785 Hyperlipidemia, unspecified: Secondary | ICD-10-CM

## 2023-02-19 DIAGNOSIS — I10 Essential (primary) hypertension: Secondary | ICD-10-CM

## 2023-02-19 NOTE — Telephone Encounter (Signed)
Pt calling regarding refills.  States [facility] [address] transferred meds to [facility] on [address] as [facility] not [facility]. States was told by [facility] [employer] meds "Were not sent in right" needed further clarification.   Please review.

## 2023-02-28 ENCOUNTER — Encounter: Payer: Self-pay | Admitting: Registered"

## 2023-02-28 ENCOUNTER — Encounter: Payer: Commercial Managed Care - PPO | Attending: Family | Admitting: Registered"

## 2023-02-28 DIAGNOSIS — E1165 Type 2 diabetes mellitus with hyperglycemia: Secondary | ICD-10-CM | POA: Diagnosis present

## 2023-02-28 DIAGNOSIS — Z794 Long term (current) use of insulin: Secondary | ICD-10-CM | POA: Insufficient documentation

## 2023-02-28 NOTE — Progress Notes (Signed)
Diabetes Self-Management Education  Visit Type: First/Initial  Appt. Start Time: 1052 Appt. End Time: 1158  02/28/2023  [name], identified by name and date of birth, is a 42 y.o. male with a diagnosis of Diabetes: Type 2.   ASSESSMENT  There were no vitals taken for this visit. There is no height or weight on file to calculate BMI.  Lab Results  Component Value Date   HGBA1C 15.0 (A) 02/14/2023   Medications:  Metformin 500 mg daily no reported side effects (Ozempic) Semaglutide 0.5 mg weekly x 2 months - no reported side effects Basglar kwikpen 10 u qhs (pt reports injecting mostly in the same spot - reviewed appropriate injection techniques)  SMBG: CGM: Dexcom G7 pt has not picked up from pharmacy yet (phone is not compatible, in-box msg was sent to prescribing physician) Pt states he is not currently checking blood sugars- hasn't been consistently checking since was first diagnosed due to his work schedule   Pt reports previous education when diagnosed 12 yrs ago.  Feet: Pt states has not seen any visible issues with feet, but reports they get sore because he is standing at work all day. Pt states his shoes are slip resistant but do not provide support. Pt reports appropriate foot care.  Eye exam: Pt states he will work on getting an appointment.  Pt will be seen for follow-up with Oran Rein   Diabetes Self-Management Education - 02/28/23 1200       Visit Information   Visit Type First/Initial      Initial Visit   Diabetes Type Type 2    Date Diagnosed 2012    Are you taking your medications as prescribed? Yes   just started last night, had issue with insurance/pharmacy     Health Coping   How would you rate your overall health? Poor      Psychosocial Assessment   Patient Belief/Attitude about Diabetes Afraid    How often do you need to have someone help you when you read instructions, pamphlets, or other written materials from your doctor or pharmacy?  1 - Never    What is the last grade level you completed in school? 12      Complications   Last HgB A1C per patient/outside source 15 %    How often do you check your blood sugar? 0 times/day (not testing)    Have you had a dilated eye exam in the past 12 months? No    Have you had a dental exam in the past 12 months? Yes    Are you checking your feet? Yes    How many days per week are you checking your feet? 7      Dietary Intake   Breakfast mandarin oranges, banana, green tea   works at 4 am- breakfast around 8   Lunch spaghetti w/ meat sauce    Snack (afternoon) green tea    Dinner pork loin, green beans, mashed potatoes    Beverage(s) water, diet green tea      Activity / Exercise   Activity / Exercise Type Light (walking / raking leaves)    How many days per week do you exercise? 7   walks dog 2 x a day   How many minutes per day do you exercise? 30    Total minutes per week of exercise 210      Patient Education   Previous Diabetes Education Yes (please comment)    Healthy Eating Role of  diet in the treatment of diabetes and the relationship between the three main macronutrients and blood glucose level;Plate Method;Meal options for control of blood glucose level and chronic complications.    Being Active Role of exercise on diabetes management, blood pressure control and cardiac health.    Medications Taught/reviewed insulin/injectables, injection, site rotation, insulin/injectables storage and needle disposal.    Monitoring Other (comment)    Acute complications Taught prevention, symptoms, and  treatment of hypoglycemia - the 15 rule.    Chronic complications Dental care;Assessed and discussed foot care and prevention of foot problems      Individualized Goals (developed by patient)   Nutrition General guidelines for healthy choices and portions discussed    Physical Activity Exercise 5-7 days per week    Medications take my medication as prescribed    Monitoring  Test my  blood glucose as discussed;Consistenly use CGM      Post-Education Assessment   Patient understands the diabetes disease and treatment process. Needs Instruction    Patient understands incorporating nutritional management into lifestyle. Comprehends key points    Patient undertands incorporating physical activity into lifestyle. Needs Review    Patient understands using medications safely. Comphrehends key points    Patient understands monitoring blood glucose, interpreting and using results Needs Instruction    Patient understands prevention, detection, and treatment of acute complications. Needs Review    Patient understands prevention, detection, and treatment of chronic complications. Needs Review    Patient understands how to develop strategies to address psychosocial issues. Needs Instruction    Patient understands how to develop strategies to promote health/change behavior. Needs Instruction      Outcomes   Expected Outcomes Demonstrated interest in learning. Expect positive outcomes    Future DMSE 4-6 wks    Program Status Not Completed             Individualized Plan for Diabetes Self-Management Training:   Learning Objective:  Patient will have a greater understanding of diabetes self-management. Patient education plan is to attend individual and/or group sessions per assessed needs and concerns.   Patient Instructions  Include protein with breakfast ideas: nuts, cheese with your fruit Follow through with getting an appointment for an eye exam  Podiatrist may be helpful, check with your insurance to see if you need to get a referral Check blood sugars at least once a day, Fasting and you could experiment a little with before a meal followed by 2 hours after a meal to see how that food affected you.  Eventually goals for your blood sugar would be to get to an A1c of 7% Fasting 80-130 ; 2 hours after meals less than 180.  Expected Outcomes:  Demonstrated interest in  learning. Expect positive outcomes  Education material provided: Planning healthy meals  If problems or questions, patient to contact team via:  Phone and MyChart  Future DSME appointment: 4-6 wks

## 2023-02-28 NOTE — Patient Instructions (Addendum)
Include protein with breakfast ideas: nuts, cheese with your fruit Follow through with getting an appointment for an eye exam  Podiatrist may be helpful, check with your insurance to see if you need to get a referral Check blood sugars at least once a day, Fasting and you could experiment a little with before a meal followed by 2 hours after a meal to see how that food affected you.  Eventually goals for your blood sugar would be to get to an A1c of 7% Fasting 80-130 ; 2 hours after meals less than 180.

## 2023-03-01 ENCOUNTER — Ambulatory Visit: Payer: Commercial Managed Care - PPO | Admitting: Dietician

## 2023-03-16 ENCOUNTER — Other Ambulatory Visit: Payer: Self-pay | Admitting: *Deleted

## 2023-03-16 ENCOUNTER — Telehealth: Payer: Self-pay | Admitting: Pharmacist

## 2023-03-16 ENCOUNTER — Ambulatory Visit: Payer: Commercial Managed Care - PPO | Attending: Family | Admitting: Pharmacist

## 2023-03-16 DIAGNOSIS — E785 Hyperlipidemia, unspecified: Secondary | ICD-10-CM | POA: Diagnosis not present

## 2023-03-16 DIAGNOSIS — I10 Essential (primary) hypertension: Secondary | ICD-10-CM | POA: Diagnosis not present

## 2023-03-16 DIAGNOSIS — E1142 Type 2 diabetes mellitus with diabetic polyneuropathy: Secondary | ICD-10-CM

## 2023-03-16 DIAGNOSIS — E1165 Type 2 diabetes mellitus with hyperglycemia: Secondary | ICD-10-CM | POA: Diagnosis not present

## 2023-03-16 DIAGNOSIS — Z794 Long term (current) use of insulin: Secondary | ICD-10-CM

## 2023-03-16 MED ORDER — FREESTYLE LIBRE 2 READER DEVI
0 refills | Status: DC
Start: 2023-03-16 — End: 2024-04-08

## 2023-03-16 MED ORDER — METFORMIN HCL ER 500 MG PO TB24
500.0000 mg | ORAL_TABLET | Freq: Every day | ORAL | 1 refills | Status: DC
Start: 2023-03-16 — End: 2023-09-19

## 2023-03-16 MED ORDER — LOSARTAN POTASSIUM 25 MG PO TABS
25.0000 mg | ORAL_TABLET | Freq: Every day | ORAL | 0 refills | Status: DC
Start: 2023-03-16 — End: 2023-09-19

## 2023-03-16 MED ORDER — ATORVASTATIN CALCIUM 10 MG PO TABS
10.0000 mg | ORAL_TABLET | Freq: Every day | ORAL | 1 refills | Status: DC
Start: 2023-03-16 — End: 2023-09-19

## 2023-03-16 MED ORDER — BASAGLAR KWIKPEN 100 UNIT/ML ~~LOC~~ SOPN
16.0000 [IU] | PEN_INJECTOR | Freq: Every day | SUBCUTANEOUS | 1 refills | Status: DC
Start: 2023-03-16 — End: 2023-03-27

## 2023-03-16 MED ORDER — OZEMPIC (0.25 OR 0.5 MG/DOSE) 2 MG/3ML ~~LOC~~ SOPN
0.2500 mg | PEN_INJECTOR | SUBCUTANEOUS | 1 refills | Status: DC
Start: 2023-03-16 — End: 2023-03-19

## 2023-03-16 MED ORDER — FREESTYLE LIBRE 2 SENSOR MISC: 4 refills | Status: DC

## 2023-03-16 MED ORDER — TRUEPLUS LANCETS 28G MISC
4 refills | Status: AC
Start: 2023-03-16 — End: ?

## 2023-03-16 MED ORDER — INSULIN PEN NEEDLE 32G X 4 MM MISC: 3 refills | Status: DC

## 2023-03-16 NOTE — Telephone Encounter (Signed)
I am hopeful to start this patient on Ozempic and the [place] system. I believe they will need PA approval. Can we initiate these?

## 2023-03-16 NOTE — Progress Notes (Signed)
S:     No chief complaint on file.  42 y.o. male who presents for diabetes evaluation, education, and management.  PMH is significant for HTN, T2DM complicated by non-adherence, diabetic neuropathy, obesity.  Patient was referred and last seen by Primary Care Provider, Ricky Stabs, on 02/14/2023.   At last visit with Ms. Zonia Kief, pt admitted to not non-adherence. He was previously on Ozempic, metformin, and Toujeo but had not been taking ~for 7 months. Attributes this to cost. He has also been followed by [facility] with last appt on 06/13/22.  Of note, he also admitted to dietary indiscretion and physical inactivity at his last appointment. A1c was 15%!  Today, patient arrives in good spirits and presents without any assistance. He is motivated to take control of his DM.   Patient reports Diabetes was diagnosed in 2010. Tells me today he found out at a doctor's office after experiencing symptoms of hyperglycemia prior. Was placed on PO medication at the time. Injectables have been used for him in the past but cost has proven to be a barrier. He denies any hospitalizations for DM. No hx of clinical ASCVSD, CHF, or CKD. No hx of thyroid cancer. No pancreatitis hx.    Family/Social History:  Fhx: DM, obesity Tobacco: never smoker Alcohol: none reported   Current diabetes medications include: Basaglar 10u daily, metformin 500 mg XR daily, Ozempic (not taking) Current hypertension medications include: losartan 25 mg daily  Current hyperlipidemia medications include: atorvastatin 10 mg daily  Patient reports adherence to taking all medications as prescribed with the exception of Ozempic.   Insurance coverage: [employer]  Patient denies hypoglycemic events.  Reported home fasting blood sugars: none   Reported 2 hour post-meal/random blood sugars: none.  Patient reports nocturia (nighttime urination).  Patient reports neuropathy (nerve pain). Patient reports visual  changes. Patient denies self foot exams.   Patient reported dietary habits:  -3 meals  -BF: limited by time.  -Lunch: varies - leftovers   -Dinner: usually a protein and two sides  -Starches: admits to regular intake of rice, beans, potatoes  -Snacks: denies any intake of sweets  -Dinners: Gatorade (regular), tea (sweet tea with sugar)  Patient-reported exercise habits:  -Walks dog 45 minutes daily    O:   ROS  Physical Exam  7 day average blood glucose: no meter with him today  No CGM active in place  Lab Results  Component Value Date   HGBA1C 15.0 (A) 02/14/2023   There were no vitals filed for this visit.  Lipid Panel     Component Value Date/Time   CHOL 176 06/21/2020 1530   TRIG 78 06/21/2020 1530   HDL 44 06/21/2020 1530   CHOLHDL 4.0 06/21/2020 1530   CHOLHDL 3.3 06/22/2016 0958   VLDL 11 06/22/2016 0958   LDLCALC 117 (H) 06/21/2020 1530   LDLDIRECT 105 (H) 08/21/2013 1606    Clinical Atherosclerotic Cardiovascular Disease (ASCVD): No  The 10-year ASCVD risk score (Arnett DK, et al., 2019) is: 16.5%   Values used to calculate the score:     Age: 79 years     Sex: Male     Is Non-Hispanic African American: Yes     Diabetic: Yes     Tobacco smoker: No     Systolic Blood Pressure: 168 mmHg     Is BP treated: Yes     HDL Cholesterol: 44 mg/dL     Total Cholesterol: 176 mg/dL   Patient is participating  in a Managed Medicaid Plan: no   A/P: Diabetes longstanding currently uncontrolled. Patient is able to verbalize appropriate hypoglycemia management plan. Medication adherence appears okay but he is not taking Ozempic. We will restart this today and increase insulin d/t the degree of his hyperglycemia and symptoms. I will also see if we can get approval for CGM. -Continued metformin 500 mg XR daily.  -Increased dose of Basaglar to 16u daily.  -Start Ozempic 0.25 mg weekly. Will plan on this dose for 4 weeks with the option to titrate to 0.5 mg weekly in  1 month. -Libre supplies sent.  -Patient educated on purpose, proper use, and potential adverse effects of Basaglar, Ozempic.  -Extensively discussed pathophysiology of diabetes, recommended lifestyle interventions, dietary effects on blood sugar control.  -Counseled on s/sx of and management of hypoglycemia.  -Next A1c anticipated 04/2023.   Written patient instructions provided. Patient verbalized understanding of treatment plan.  Total time in face to face counseling 30 minutes.    Follow-up:  Pharmacist in 1 month.  Butch Penny, PharmD, Patsy [place], CPP Clinical Pharmacist [facility] [phone]

## 2023-03-19 ENCOUNTER — Other Ambulatory Visit: Payer: Self-pay

## 2023-03-19 ENCOUNTER — Other Ambulatory Visit: Payer: Self-pay | Admitting: Pharmacist

## 2023-03-19 DIAGNOSIS — Z794 Long term (current) use of insulin: Secondary | ICD-10-CM

## 2023-03-19 MED ORDER — OZEMPIC (0.25 OR 0.5 MG/DOSE) 2 MG/3ML ~~LOC~~ SOPN
0.2500 mg | PEN_INJECTOR | SUBCUTANEOUS | 1 refills | Status: DC
Start: 2023-03-19 — End: 2023-03-27

## 2023-03-27 ENCOUNTER — Other Ambulatory Visit: Payer: Self-pay

## 2023-03-27 ENCOUNTER — Other Ambulatory Visit: Payer: Self-pay | Admitting: Pharmacist

## 2023-03-27 DIAGNOSIS — E1165 Type 2 diabetes mellitus with hyperglycemia: Secondary | ICD-10-CM

## 2023-03-27 MED ORDER — BASAGLAR KWIKPEN 100 UNIT/ML ~~LOC~~ SOPN
22.0000 [IU] | PEN_INJECTOR | Freq: Every day | SUBCUTANEOUS | 1 refills | Status: DC
Start: 2023-03-27 — End: 2023-09-03

## 2023-03-30 ENCOUNTER — Ambulatory Visit: Payer: Commercial Managed Care - PPO | Admitting: Dietician

## 2023-04-04 ENCOUNTER — Other Ambulatory Visit: Payer: Self-pay

## 2023-04-17 ENCOUNTER — Ambulatory Visit: Payer: Commercial Managed Care - PPO | Admitting: Pharmacist

## 2023-04-25 ENCOUNTER — Telehealth: Payer: Self-pay

## 2023-04-25 NOTE — Progress Notes (Unsigned)
Erroneous encounter-disregard

## 2023-04-26 ENCOUNTER — Encounter: Payer: Commercial Managed Care - PPO | Admitting: Family

## 2023-04-26 DIAGNOSIS — Z0000 Encounter for general adult medical examination without abnormal findings: Secondary | ICD-10-CM

## 2023-04-26 DIAGNOSIS — E1165 Type 2 diabetes mellitus with hyperglycemia: Secondary | ICD-10-CM

## 2023-04-26 DIAGNOSIS — Z130 Encounter for screening for diseases of the blood and blood-forming organs and certain disorders involving the immune mechanism: Secondary | ICD-10-CM

## 2023-04-26 DIAGNOSIS — I10 Essential (primary) hypertension: Secondary | ICD-10-CM

## 2023-04-26 DIAGNOSIS — E785 Hyperlipidemia, unspecified: Secondary | ICD-10-CM

## 2023-04-26 DIAGNOSIS — Z1329 Encounter for screening for other suspected endocrine disorder: Secondary | ICD-10-CM

## 2023-04-26 DIAGNOSIS — Z13228 Encounter for screening for other metabolic disorders: Secondary | ICD-10-CM

## 2023-04-26 DIAGNOSIS — E119 Type 2 diabetes mellitus without complications: Secondary | ICD-10-CM

## 2023-07-03 ENCOUNTER — Telehealth: Payer: Self-pay | Admitting: Family

## 2023-07-03 NOTE — Telephone Encounter (Signed)
Called pt and left vm to call office back to schedule appt for urologist referral.

## 2023-08-10 ENCOUNTER — Emergency Department (HOSPITAL_BASED_OUTPATIENT_CLINIC_OR_DEPARTMENT_OTHER)
Admission: EM | Admit: 2023-08-10 | Discharge: 2023-08-10 | Disposition: A | Discharge status: Home / Self Care | Payer: Commercial Managed Care - PPO | Source: Home / Self Care

## 2023-08-11 ENCOUNTER — Telehealth: Payer: Commercial Managed Care - PPO | Admitting: Nurse Practitioner

## 2023-08-11 DIAGNOSIS — U071 COVID-19: Secondary | ICD-10-CM | POA: Diagnosis not present

## 2023-08-11 MED ORDER — NIRMATRELVIR/RITONAVIR (PAXLOVID)TABLET
3.0000 | ORAL_TABLET | Freq: Two times a day (BID) | ORAL | 0 refills | Status: AC
Start: 2023-08-11 — End: 2023-08-16

## 2023-08-11 NOTE — Progress Notes (Signed)
Virtual Visit Consent   [name], you are scheduled for a virtual visit with a [facility] provider today. Just as with appointments in the office, your consent must be obtained to participate. Your consent will be active for this visit and any virtual visit you may have with one of our providers in the next 365 days. If you have a MyChart account, a copy of this consent can be sent to you electronically.  As this is a virtual visit, video technology does not allow for your provider to perform a traditional examination. This may limit your provider's ability to fully assess your condition. If your provider identifies any concerns that need to be evaluated in person or the need to arrange testing (such as labs, EKG, etc.), we will make arrangements to do so. Although advances in technology are sophisticated, we cannot ensure that it will always work on either your end or our end. If the connection with a video visit is poor, the visit may have to be switched to a telephone visit. With either a video or telephone visit, we are not always able to ensure that we have a secure connection.  By engaging in this virtual visit, you consent to the provision of healthcare and authorize for your insurance to be billed (if applicable) for the services provided during this visit. Depending on your insurance coverage, you may receive a charge related to this service.  I need to obtain your verbal consent now. Are you willing to proceed with your visit today? DIEGO DUBLE has provided verbal consent on 08/11/2023 for a virtual visit (video or telephone). Claiborne Rigg, NP  Date: 08/11/2023 12:45 PM  Virtual Visit via Video Note   I, Claiborne Rigg, connected with  Ian Hughes  ([id number], [birth year]) on 08/11/23 at 12:45 PM EDT by a video-enabled telemedicine application and verified that I am speaking with the correct person using two identifiers.  Location: Patient: Virtual Visit Location Patient:  Home Provider: Virtual Visit Location Provider: Home Office   I discussed the limitations of evaluation and management by telemedicine and the availability of in person appointments. The patient expressed understanding and agreed to proceed.    History of Present Illness: Ian Hughes is a 42 y.o. who identifies as a male who was assigned male at birth, and is being seen today for COVID  positive.  Mr. Kemme has been experiencing the following symptoms over the past 3 days:  Sinus pressure, drainage, sore throat, cough. Denies fever, chest pain or shortness of breath. He has a history of poorly controlled diabetes.    Problems:  Patient Active Problem List   Diagnosis Date Noted   Type 2 diabetes mellitus with diabetic polyneuropathy, with long-term current use of insulin (HCC) 06/13/2022   Type 2 diabetes mellitus with hyperglycemia, with long-term current use of insulin (HCC) 06/13/2022   Type 2 diabetes mellitus with hyperosmolarity without coma, with long-term current use of insulin (HCC) 02/11/2020   Glucosuria 02/11/2020   Non-restorative sleep 02/11/2020   Excessive daytime sleepiness 02/11/2020   Sleep related headaches 02/11/2020   Nocturia more than twice per night 02/11/2020   UARS (upper airway resistance syndrome) 04/07/2015   Severe obesity (BMI >= 40) (HCC) 04/07/2015   Primary snoring 04/07/2015   Snoring 12/21/2014   Drowsiness 12/21/2014   Witnessed apneic spells 12/21/2014   ED (erectile dysfunction) 09/24/2012   Diabetes mellitus type 2 in obese 07/04/2009    Class: Chronic  OBESITY, NOS 01/17/2007   BELLS PALSY 01/17/2007   HYPERTENSION, BENIGN SYSTEMIC 01/17/2007    Allergies: No Known Allergies Medications:  Current Outpatient Medications:    nirmatrelvir/ritonavir (PAXLOVID) 20 x 150 MG & 10 x 100MG  TABS, Take 3 tablets by mouth 2 (two) times daily for 5 days. (Take nirmatrelvir 150 mg two tablets twice daily for 5 days and ritonavir 100 mg one tablet  twice daily for 5 days) Patient GFR is 113, Disp: 30 tablet, Rfl: 0   atorvastatin (LIPITOR) 10 MG tablet, Take 1 tablet (10 mg total) by mouth daily., Disp: 90 tablet, Rfl: 1   Continuous Glucose Receiver (FREESTYLE LIBRE 2 READER) DEVI, Use to check blood sugar continuously throughout the day. E11.42, Disp: 1 each, Rfl: 0   Continuous Glucose Sensor (FREESTYLE LIBRE 2 SENSOR) MISC, Use to check blood sugar continuously throughout the day. Change sensors once every 14 days. E11.42, Disp: 2 each, Rfl: 4   ibuprofen (ADVIL) 600 MG tablet, Take 1 tablet (600 mg total) by mouth every 6 (six) hours as needed. (Patient not taking: Reported on 06/13/2022), Disp: 30 tablet, Rfl: 0   Insulin Glargine (BASAGLAR KWIKPEN) 100 UNIT/ML, Inject 22 Units into the skin at bedtime., Disp: 15 mL, Rfl: 1   Insulin Pen Needle 32G X 4 MM MISC, Use to inject insulin once daily., Disp: 100 each, Rfl: 3   losartan (COZAAR) 25 MG tablet, Take 1 tablet (25 mg total) by mouth daily., Disp: 90 tablet, Rfl: 0   metFORMIN (GLUCOPHAGE-XR) 500 MG 24 hr tablet, Take 1 tablet (500 mg total) by mouth daily with breakfast., Disp: 90 tablet, Rfl: 1   tadalafil (CIALIS) 20 MG tablet, Take 1 tablet (20 mg total) by mouth daily as needed for erectile dysfunction. PT NOT USING INSURANCE. WILL NOT COMPLETE PA. PT WILL PURCHASE OUT OF POCKET WITH GOOD RX., Disp: 10 tablet, Rfl: 11   TRUEplus Lancets 28G MISC, Use to check blood sugar 2 times a day, Disp: 100 each, Rfl: 4  Observations/Objective: Patient is well-developed, well-nourished in no acute distress.  Resting comfortably  at home.  Head is normocephalic, atraumatic.  No labored breathing.  Speech is clear and coherent with logical content.  Patient is alert and oriented at baseline.    Assessment and Plan: 1. Lab test positive for detection of COVID-19 virus - nirmatrelvir/ritonavir (PAXLOVID) 20 x 150 MG & 10 x 100MG  TABS; Take 3 tablets by mouth 2 (two) times daily for 5 days.  (Take nirmatrelvir 150 mg two tablets twice daily for 5 days and ritonavir 100 mg one tablet twice daily for 5 days) Patient GFR is 113  Dispense: 30 tablet; Refill: 0   Please keep well-hydrated and get plenty of rest. Start a saline nasal rinse to flush out your nasal passages. You can use plain Mucinex to help thin congestion. If you have a humidifier, you can use this daily as needed.    You are to wear a mask for 5 days from onset of your symptoms.  After day 5, if you have had no fever and you are feeling better with NO symptoms, you can end masking. Keep in mind you can be contagious 10 days from the onset of symptoms  After day 5 if you have a fever or are having significant symptoms, please wear your mask for full 10 days.   If you note any worsening of symptoms, any significant shortness of breath or any chest pain, please seek ER evaluation ASAP.  Please  do not delay care!    If you note any worsening of symptoms, any significant shortness of breath or any chest pain, please seek ER evaluation ASAP.  Please do not delay care!   Follow Up Instructions: I discussed the assessment and treatment plan with the patient. The patient was provided an opportunity to ask questions and all were answered. The patient agreed with the plan and demonstrated an understanding of the instructions.  A copy of instructions were sent to the patient via MyChart unless otherwise noted below.    The patient was advised to call back or seek an in-person evaluation if the symptoms worsen or if the condition fails to improve as anticipated.  Time:  I spent 12 minutes with the patient via telehealth technology discussing the above problems/concerns.    Claiborne Rigg, NP

## 2023-08-11 NOTE — Patient Instructions (Signed)
Ian Hughes, thank you for joining Claiborne Rigg, NP for today's virtual visit.  While this provider is not your primary care provider (PCP), if your PCP is located in our provider database this encounter information will be shared with them immediately following your visit.   A [facility] MyChart account gives you access to today's visit and all your visits, tests, and labs performed at [facility] " click here if you don't have a [facility] MyChart account or go to [email or web address]  Consent: (Patient) Ian Hughes provided verbal consent for this virtual visit at the beginning of the encounter.  Current Medications:  Current Outpatient Medications:    nirmatrelvir/ritonavir (PAXLOVID) 20 x 150 MG & 10 x 100MG  TABS, Take 3 tablets by mouth 2 (two) times daily for 5 days. (Take nirmatrelvir 150 mg two tablets twice daily for 5 days and ritonavir 100 mg one tablet twice daily for 5 days) Patient GFR is 113, Disp: 30 tablet, Rfl: 0   atorvastatin (LIPITOR) 10 MG tablet, Take 1 tablet (10 mg total) by mouth daily., Disp: 90 tablet, Rfl: 1   Continuous Glucose Receiver (FREESTYLE LIBRE 2 READER) DEVI, Use to check blood sugar continuously throughout the day. E11.42, Disp: 1 each, Rfl: 0   Continuous Glucose Sensor (FREESTYLE LIBRE 2 SENSOR) MISC, Use to check blood sugar continuously throughout the day. Change sensors once every 14 days. E11.42, Disp: 2 each, Rfl: 4   ibuprofen (ADVIL) 600 MG tablet, Take 1 tablet (600 mg total) by mouth every 6 (six) hours as needed. (Patient not taking: Reported on 06/13/2022), Disp: 30 tablet, Rfl: 0   Insulin Glargine (BASAGLAR KWIKPEN) 100 UNIT/ML, Inject 22 Units into the skin at bedtime., Disp: 15 mL, Rfl: 1   Insulin Pen Needle 32G X 4 MM MISC, Use to inject insulin once daily., Disp: 100 each, Rfl: 3   losartan (COZAAR) 25 MG tablet, Take 1 tablet (25 mg total) by mouth daily., Disp: 90 tablet, Rfl: 0   metFORMIN (GLUCOPHAGE-XR)  500 MG 24 hr tablet, Take 1 tablet (500 mg total) by mouth daily with breakfast., Disp: 90 tablet, Rfl: 1   tadalafil (CIALIS) 20 MG tablet, Take 1 tablet (20 mg total) by mouth daily as needed for erectile dysfunction. PT NOT USING INSURANCE. WILL NOT COMPLETE PA. PT WILL PURCHASE OUT OF POCKET WITH GOOD RX., Disp: 10 tablet, Rfl: 11   TRUEplus Lancets 28G MISC, Use to check blood sugar 2 times a day, Disp: 100 each, Rfl: 4   Medications ordered in this encounter:  Meds ordered this encounter  Medications   nirmatrelvir/ritonavir (PAXLOVID) 20 x 150 MG & 10 x 100MG  TABS    Sig: Take 3 tablets by mouth 2 (two) times daily for 5 days. (Take nirmatrelvir 150 mg two tablets twice daily for 5 days and ritonavir 100 mg one tablet twice daily for 5 days) Patient GFR is 113    Dispense:  30 tablet    Refill:  0    Order Specific Question:   Supervising Provider    Answer:   Merrilee Jansky [id number]     *If you need refills on other medications prior to your next appointment, please contact your pharmacy*  Follow-Up: Call back or seek an in-person evaluation if the symptoms worsen or if the condition fails to improve as anticipated.  [facility] [phone]  Other Instructions  Please keep well-hydrated and get plenty of rest. Start a saline nasal rinse to  flush out your nasal passages. You can use plain Mucinex to help thin congestion. If you have a humidifier, you can use this daily as needed.    You are to wear a mask for 5 days from onset of your symptoms.  After day 5, if you have had no fever and you are feeling better with NO symptoms, you can end masking. Keep in mind you can be contagious 10 days from the onset of symptoms  After day 5 if you have a fever or are having significant symptoms, please wear your mask for full 10 days.   If you note any worsening of symptoms, any significant shortness of breath or any chest pain, please seek ER evaluation ASAP.  Please  do not delay care!    If you note any worsening of symptoms, any significant shortness of breath or any chest pain, please seek ER evaluation ASAP.  Please do not delay care!    If you have been instructed to have an in-person evaluation today at a local Urgent Care facility, please use the link below. It will take you to a list of all of our available [facility], including address, phone number and hours of operation. Please do not delay care.  [facility]  If you or a family member do not have a primary care provider, use the link below to schedule a visit and establish care. When you choose a [facility] primary care physician or advanced practice provider, you gain a long-term partner in health. Find a Primary Care Provider  Learn more about [facility]'s in-office and virtual care options: [facility] [place] [facility] - Get Care Now

## 2023-09-02 ENCOUNTER — Other Ambulatory Visit: Payer: Self-pay | Admitting: Family Medicine

## 2023-09-02 DIAGNOSIS — E1165 Type 2 diabetes mellitus with hyperglycemia: Secondary | ICD-10-CM

## 2023-09-03 NOTE — Telephone Encounter (Signed)
Complete. Schedule appointment.

## 2023-09-17 ENCOUNTER — Other Ambulatory Visit: Payer: Self-pay | Admitting: Family Medicine

## 2023-09-17 DIAGNOSIS — I10 Essential (primary) hypertension: Secondary | ICD-10-CM

## 2023-09-19 ENCOUNTER — Ambulatory Visit (INDEPENDENT_AMBULATORY_CARE_PROVIDER_SITE_OTHER): Payer: Commercial Managed Care - PPO | Admitting: Family

## 2023-09-19 ENCOUNTER — Encounter: Payer: Self-pay | Admitting: Family

## 2023-09-19 VITALS — BP 147/89 | HR 82 | Temp 98.7°F | Ht 70.0 in | Wt 250.0 lb

## 2023-09-19 DIAGNOSIS — Z794 Long term (current) use of insulin: Secondary | ICD-10-CM | POA: Diagnosis not present

## 2023-09-19 DIAGNOSIS — E1165 Type 2 diabetes mellitus with hyperglycemia: Secondary | ICD-10-CM | POA: Diagnosis not present

## 2023-09-19 DIAGNOSIS — E119 Type 2 diabetes mellitus without complications: Secondary | ICD-10-CM

## 2023-09-19 DIAGNOSIS — E785 Hyperlipidemia, unspecified: Secondary | ICD-10-CM

## 2023-09-19 DIAGNOSIS — Z7984 Long term (current) use of oral hypoglycemic drugs: Secondary | ICD-10-CM | POA: Diagnosis not present

## 2023-09-19 DIAGNOSIS — N529 Male erectile dysfunction, unspecified: Secondary | ICD-10-CM

## 2023-09-19 DIAGNOSIS — I10 Essential (primary) hypertension: Secondary | ICD-10-CM | POA: Diagnosis not present

## 2023-09-19 DIAGNOSIS — Z23 Encounter for immunization: Secondary | ICD-10-CM

## 2023-09-19 LAB — POCT GLYCOSYLATED HEMOGLOBIN (HGB A1C): HbA1c, POC (controlled diabetic range): 12.7 % — AB (ref 0.0–7.0)

## 2023-09-19 MED ORDER — LOSARTAN POTASSIUM 25 MG PO TABS: 25.0000 mg | ORAL_TABLET | Freq: Every day | ORAL | 0 refills | Status: DC

## 2023-09-19 MED ORDER — ATORVASTATIN CALCIUM 10 MG PO TABS
10.0000 mg | ORAL_TABLET | Freq: Every day | ORAL | 0 refills | Status: DC
Start: 2023-09-19 — End: 2024-09-10

## 2023-09-19 MED ORDER — AMLODIPINE BESYLATE 5 MG PO TABS: 5.0000 mg | ORAL_TABLET | Freq: Every day | ORAL | 1 refills | Status: DC

## 2023-09-19 MED ORDER — METFORMIN HCL ER 500 MG PO TB24
500.0000 mg | ORAL_TABLET | Freq: Two times a day (BID) | ORAL | 1 refills | Status: DC
Start: 2023-09-19 — End: 2023-12-21

## 2023-09-19 MED ORDER — BASAGLAR KWIKPEN 100 UNIT/ML ~~LOC~~ SOPN: 20.0000 [IU] | PEN_INJECTOR | Freq: Every day | SUBCUTANEOUS | 0 refills | Status: DC

## 2023-09-19 NOTE — Progress Notes (Unsigned)
Patient wants to discuss his referral.  Patient wants Flu vaccine.

## 2023-09-19 NOTE — Progress Notes (Unsigned)
Patient ID: Ian Hughes, male    DOB: September 28, 1981  MRN: [id number]  CC: Chronic Conditions Follow-Up  Subjective: Ian Hughes is a 42 y.o. male who presents for chronic conditions follow-up.   His concerns today include:  - Patient presents for chronic conditions follow-up. Last office visit at [facility] on 02/14/2023. - High blood pressure. Reports he has not taken Losartan in several months. He does not check blood pressure outside of office. He is trying to limit salt intake. He exercises. Reports he did not establish with Cardiology since previous office visit. He does not complain of red flag symptoms such as but not limited to chest pain, shortness of breath, worst headache of life, nausea/vomiting.  - Diabetes type 2. Last office visit at [facility] on 03/16/2023 with Georgiana Shore Ausdall, RPH-CPP. During that office visit patient was restarted on Ozempic 0.25 mg weekly, continued on Metformin 500 mg daily, and Basaglar increased to 16 units daily. Today patient reports he is doing well on Metformin and Basaglar insulin. Reports he is not taking Ozempic per his preference. He is trying to limit carbohydrate and sugar intake. Home blood sugars 200's. Reports he has not seen Endocrinology in over 1 year. He denies red flag symptoms associated with diabetes.  - Doing well on Atorvastatin, no issues/concerns. - Upcoming appointment with [facility] on 10/23/2023 for erectile dysfunction. Requests referral to Urology on today to see if he can be scheduled for a sooner appointment.  - No further issues/concerns for discussion today.   Patient Active Problem List   Diagnosis Date Noted   Type 2 diabetes mellitus with diabetic polyneuropathy, with long-term current use of insulin (HCC) 06/13/2022   Type 2 diabetes mellitus with hyperglycemia, with long-term current use of insulin (HCC) 06/13/2022   Type 2 diabetes mellitus with hyperosmolarity  without coma, with long-term current use of insulin (HCC) 02/11/2020   Glucosuria 02/11/2020   Non-restorative sleep 02/11/2020   Excessive daytime sleepiness 02/11/2020   Sleep related headaches 02/11/2020   Nocturia more than twice per night 02/11/2020   UARS (upper airway resistance syndrome) 04/07/2015   Severe obesity (BMI >= 40) (HCC) 04/07/2015   Primary snoring 04/07/2015   Snoring 12/21/2014   Drowsiness 12/21/2014   Witnessed apneic spells 12/21/2014   ED (erectile dysfunction) 09/24/2012   Type 2 diabetes mellitus with obesity (HCC) 07/04/2009    Class: Chronic   OBESITY, NOS 01/17/2007   BELLS PALSY 01/17/2007   HYPERTENSION, BENIGN SYSTEMIC 01/17/2007     Current Outpatient Medications on File Prior to Visit  Medication Sig Dispense Refill   Continuous Glucose Receiver (FREESTYLE LIBRE 2 READER) DEVI Use to check blood sugar continuously throughout the day. E11.42 1 each 0   Continuous Glucose Sensor (FREESTYLE LIBRE 2 SENSOR) MISC Use to check blood sugar continuously throughout the day. Change sensors once every 14 days. E11.42 2 each 4   TRUEplus Lancets 28G MISC Use to check blood sugar 2 times a day 100 each 4   [DISCONTINUED] bromocriptine (PARLODEL) 2.5 MG tablet 1/4 tab daily (Patient not taking: Reported on 03/12/2019) 25 tablet 3   [DISCONTINUED] glimepiride (AMARYL) 1 MG tablet Take 0.5 tablets (0.5 mg total) by mouth daily with breakfast. (Patient not taking: Reported on 03/12/2019) 45 tablet 3   No current facility-administered medications on file prior to visit.    No Known Allergies  Social History   Socioeconomic History   Marital status: Divorced  Spouse name: Not on file   Number of children: 1   Years of education: 12+   Highest education level: 12th grade  Occupational History    Employer: [employer]  Tobacco Use   Smoking status: Never    Passive exposure: Never   Smokeless tobacco: Never  Vaping Use   Vaping status: Never Used   Substance and Sexual Activity   Alcohol use: Yes    Comment: 1 or 2 every 3 months    Drug use: No   Sexual activity: Not on file  Other Topics Concern   Not on file  Social History Narrative   Lives at home with wife and stepson.   Caffeine use: none   Has one child.   Social Determinants of Health   Financial Resource Strain: Low Risk  (03/12/2023)   Overall Financial Resource Strain (CARDIA)    Difficulty of Paying Living Expenses: Not very hard  Food Insecurity: No Food Insecurity (03/12/2023)   Hunger Vital Sign    Worried About Running Out of Food in the Last Year: Never true    Ran Out of Food in the Last Year: Never true  Transportation Needs: No Transportation Needs (03/12/2023)   PRAPARE - Administrator, Civil Service (Medical): No    Lack of Transportation (Non-Medical): No  Physical Activity: Sufficiently Active (03/12/2023)   Exercise Vital Sign    Days of Exercise per Week: 7 days    Minutes of Exercise per Session: 30 min  Stress: No Stress Concern Present (03/12/2023)   [employer] of Occupational Health - Occupational Stress Questionnaire    Feeling of Stress : Only a little  Social Connections: Unknown (03/16/2023)   Social Connection and Isolation Panel [NHANES]    Frequency of Communication with Friends and Family: Twice a week    Frequency of Social Gatherings with Friends and Family: Once a week    Attends Religious Services: Never    Database administrator or Organizations: No    Attends Engineer, structural: Never    Marital Status: Patient declined  Catering manager Violence: Not on file    Family History  Problem Relation Age of Onset   Diabetes Mother    Cancer Father        leukemia    Obesity Other     Past Surgical History:  Procedure Laterality Date   EYE SURGERY Left 2011   cataract   EYE SURGERY Right 2012    ROS: Review of Systems Negative except as stated above  PHYSICAL EXAM: BP (!) 147/89    Pulse 82   Temp 98.7 F (37.1 C) (Oral)   Ht 5\' 10"  (1.778 m)   Wt 250 lb (113.4 kg)   SpO2 98%   BMI 35.87 kg/m   Physical Exam HENT:     Head: Normocephalic and atraumatic.     Nose: Nose normal.     Mouth/Throat:     Mouth: Mucous membranes are moist.     Pharynx: Oropharynx is clear.  Eyes:     Extraocular Movements: Extraocular movements intact.     Conjunctiva/sclera: Conjunctivae normal.     Pupils: Pupils are equal, round, and reactive to light.  Cardiovascular:     Rate and Rhythm: Normal rate and regular rhythm.     Pulses: Normal pulses.     Heart sounds: Normal heart sounds.  Pulmonary:     Effort: Pulmonary effort is normal.     Breath sounds:  Normal breath sounds.  Musculoskeletal:        General: Normal range of motion.     Cervical back: Normal range of motion and neck supple.  Neurological:     General: No focal deficit present.     Mental Status: He is alert and oriented to person, place, and time.  Psychiatric:        Mood and Affect: Mood normal.        Behavior: Behavior normal.    Results for orders placed or performed in visit on 09/19/23  Basic Metabolic Panel  Result Value Ref Range   Glucose 467 (H) 70 - 99 mg/dL   BUN 11 6 - 24 mg/dL   Creatinine, Ser 9.60 0.76 - 1.27 mg/dL   eGFR 454 >09 WJ/XBJ/4.78   BUN/Creatinine Ratio 12 9 - 20   Sodium 136 134 - 144 mmol/L   Potassium 4.3 3.5 - 5.2 mmol/L   Chloride 99 96 - 106 mmol/L   CO2 22 20 - 29 mmol/L   Calcium 9.1 8.7 - 10.2 mg/dL  Lipid panel  Result Value Ref Range   Cholesterol, Total 108 100 - 199 mg/dL   Triglycerides 86 0 - 149 mg/dL   HDL 41 >29 mg/dL   VLDL Cholesterol Cal 17 5 - 40 mg/dL   LDL Chol Calc (NIH) 50 0 - 99 mg/dL   Chol/HDL Ratio 2.6 0.0 - 5.0 ratio  POCT glycosylated hemoglobin (Hb A1C)  Result Value Ref Range   Hemoglobin A1C     HbA1c POC (<> result, manual entry)     HbA1c, POC (prediabetic range)     HbA1c, POC (controlled diabetic range) 12.7 (A) 0.0 -  7.0 %    ASSESSMENT AND PLAN: 1. Primary hypertension - Blood pressure not at goal during today's visit. Patient asymptomatic without chest pressure, chest pain, palpitations, shortness of breath, worst headache of life, and any additional red flag symptoms. - Resume Losartan as prescribed.  - Routine screening.  - Counseled on blood pressure goal of less than 130/80, low-sodium, DASH diet, medication compliance, and 150 minutes of moderate intensity exercise per week as tolerated. Counseled on medication adherence and adverse effects. - Follow-up with primary provider in 2 weeks or sooner if needed for blood pressure check. - Basic Metabolic Panel - losartan (COZAAR) 25 MG tablet; Take 1 tablet (25 mg total) by mouth daily.  Dispense: 90 tablet; Refill: 0  2. Uncontrolled type 2 diabetes mellitus with hyperglycemia (HCC) - Hemoglobin A1c not at goal at 12.7%, goal 7%.  - Increase Metformin from 500 mg once daily to 500 mg twice daily. Counseled on medication adherence/adverse effects.  - Increase Insulin Glargine from 16 units daily to 20 units daily. Counseled on medication adherence/adverse effects.  - Discussed the importance of healthy eating habits, low-carbohydrate diet, low-sugar diet, regular aerobic exercise (at least 150 minutes a week as tolerated) and medication compliance to achieve or maintain control of diabetes. - Follow-up with clinical pharmacist in 4 weeks or sooner if needed for diabetes checkup. Write your home blood sugar results down each day and bring those results to your appointment along with your home glucose monitor. Medications may be revised at that time if needed. - Referral to Endocrinology for further evaluation/management. - Follow-up with primary provider as scheduled.  - POCT glycosylated hemoglobin (Hb A1C) - metFORMIN (GLUCOPHAGE-XR) 500 MG 24 hr tablet; Take 1 tablet (500 mg total) by mouth 2 (two) times daily with a meal.  Dispense: 60  tablet; Refill:  1 - Insulin Glargine (BASAGLAR KWIKPEN) 100 UNIT/ML; Inject 20 Units into the skin daily.  Dispense: 15 mL; Refill: 0 - Ambulatory referral to Endocrinology - Insulin Pen Needle 32G X 4 MM MISC; 1 each by Other route daily. Use to inject insulin once daily.  Dispense: 100 each; Refill: 0  3. Diabetic eye exam [facility]) - Referral to Ophthalmology for evaluation/management.  - Ambulatory referral to Ophthalmology  4. Encounter for diabetic foot exam [facility]) - Referral to Podiatry for evaluation/management. - Ambulatory referral to Podiatry  5. Hyperlipidemia, unspecified hyperlipidemia type - Continue Atorvastatin as prescribed. Counseled on medication adherence/adverse effects.  - Routine screening.  - Follow-up with primary provider as scheduled.  - Lipid panel - atorvastatin (LIPITOR) 10 MG tablet; Take 1 tablet (10 mg total) by mouth daily.  Dispense: 90 tablet; Refill: 0  6. Erectile dysfunction, unspecified erectile dysfunction type - Referral to Urology for evaluation/management. - Ambulatory referral to Urology  7. Encounter for immunization - Administered.  - Flu vaccine trivalent PF, 6mos and older(Flulaval,Afluria,Fluarix,Fluzone)  Patient was given the opportunity to ask questions.  Patient verbalized understanding of the plan and was able to repeat key elements of the plan. Patient was given clear instructions to go to Emergency Department or return to medical center if symptoms don't improve, worsen, or new problems develop.The patient verbalized understanding.   Orders Placed This Encounter  Procedures   Flu vaccine trivalent PF, 6mos and older(Flulaval,Afluria,Fluarix,Fluzone)   Basic Metabolic Panel   Lipid panel   Ambulatory referral to Urology   Ambulatory referral to Endocrinology   Ambulatory referral to Ophthalmology   Ambulatory referral to Podiatry   POCT glycosylated hemoglobin (Hb A1C)     Requested Prescriptions   Signed Prescriptions Disp Refills    atorvastatin (LIPITOR) 10 MG tablet 90 tablet 0    Sig: Take 1 tablet (10 mg total) by mouth daily.   metFORMIN (GLUCOPHAGE-XR) 500 MG 24 hr tablet 60 tablet 1    Sig: Take 1 tablet (500 mg total) by mouth 2 (two) times daily with a meal.   Insulin Glargine (BASAGLAR KWIKPEN) 100 UNIT/ML 15 mL 0    Sig: Inject 20 Units into the skin daily.   losartan (COZAAR) 25 MG tablet 90 tablet 0    Sig: Take 1 tablet (25 mg total) by mouth daily.   Insulin Pen Needle 32G X 4 MM MISC 100 each 0    Sig: 1 each by Other route daily. Use to inject insulin once daily.    Return in about 2 weeks (around 10/03/2023) for Follow-Up or next available blood pressure check and 4 weeks pharmacist for diabetes.  Rema Fendt, NP

## 2023-09-20 LAB — BASIC METABOLIC PANEL
BUN/Creatinine Ratio: 12 (ref 9–20)
BUN: 11 mg/dL (ref 6–24)
CO2: 22 mmol/L (ref 20–29)
Calcium: 9.1 mg/dL (ref 8.7–10.2)
Chloride: 99 mmol/L (ref 96–106)
Creatinine, Ser: 0.91 mg/dL (ref 0.76–1.27)
Glucose: 467 mg/dL — ABNORMAL HIGH (ref 70–99)
Potassium: 4.3 mmol/L (ref 3.5–5.2)
Sodium: 136 mmol/L (ref 134–144)
eGFR: 108 mL/min/{1.73_m2} (ref >59)

## 2023-09-20 LAB — LIPID PANEL
Chol/HDL Ratio: 2.6 ratio (ref 0.0–5.0)
Cholesterol, Total: 108 mg/dL (ref 100–199)
HDL: 41 mg/dL (ref >39)
LDL Chol Calc (NIH): 50 mg/dL (ref 0–99)
Triglycerides: 86 mg/dL (ref 0–149)
VLDL Cholesterol Cal: 17 mg/dL (ref 5–40)

## 2023-09-20 MED ORDER — INSULIN PEN NEEDLE 32G X 4 MM MISC: 1.0000 | Freq: Every day | 0 refills | Status: DC

## 2023-09-21 ENCOUNTER — Telehealth: Payer: Self-pay

## 2023-09-27 ENCOUNTER — Telehealth: Payer: Self-pay | Admitting: Family

## 2023-10-05 ENCOUNTER — Ambulatory Visit (INDEPENDENT_AMBULATORY_CARE_PROVIDER_SITE_OTHER): Payer: Commercial Managed Care - PPO | Admitting: Family

## 2023-10-05 ENCOUNTER — Encounter: Payer: Self-pay | Admitting: Family

## 2023-10-05 VITALS — BP 138/87 | HR 90 | Temp 98.9°F | Ht 70.0 in | Wt 249.0 lb

## 2023-10-05 DIAGNOSIS — I10 Essential (primary) hypertension: Secondary | ICD-10-CM | POA: Diagnosis not present

## 2023-10-05 NOTE — Progress Notes (Signed)
Patient states no concerns to discuss

## 2023-10-05 NOTE — Progress Notes (Signed)
Patient ID: Ian Hughes, male    DOB: [birth year]  MRN: [id number]  CC: Chronic Conditions Follow-Up  Subjective: Ian Hughes is a 42 y.o. male who presents for chronic conditions follow-up.   His concerns today include:  - Doing well on Losartan, no issues/concerns. He does not complain of red flag symptoms such as but not limited to chest pain, shortness of breath, worst headache of life, nausea/vomiting.  - Doing well on Metformin and Insulin Glargine, no issues/concerns. Infrequently checking blood sugars at home (200's). He denies red flag symptoms associated with diabetes. States Endocrinology called him to schedule an appointment but he was unable to do so because of his work schedule. He is aware to call back to schedule appointment with them. - Upcoming appointment with Urology.   Patient Active Problem List   Diagnosis Date Noted   Type 2 diabetes mellitus with diabetic polyneuropathy, with long-term current use of insulin (HCC) 06/13/2022   Type 2 diabetes mellitus with hyperglycemia, with long-term current use of insulin (HCC) 06/13/2022   Type 2 diabetes mellitus with hyperosmolarity without coma, with long-term current use of insulin (HCC) 02/11/2020   Glucosuria 02/11/2020   Non-restorative sleep 02/11/2020   Excessive daytime sleepiness 02/11/2020   Sleep related headaches 02/11/2020   Nocturia more than twice per night 02/11/2020   UARS (upper airway resistance syndrome) 04/07/2015   Severe obesity (BMI >= 40) (HCC) 04/07/2015   Primary snoring 04/07/2015   Snoring 12/21/2014   Drowsiness 12/21/2014   Witnessed apneic spells 12/21/2014   ED (erectile dysfunction) 09/24/2012   Type 2 diabetes mellitus with obesity (HCC) 07/04/2009    Class: Chronic   OBESITY, NOS 01/17/2007   BELLS PALSY 01/17/2007   HYPERTENSION, BENIGN SYSTEMIC 01/17/2007     Current Outpatient Medications on File Prior to Visit  Medication Sig Dispense Refill   atorvastatin (LIPITOR) 10  MG tablet Take 1 tablet (10 mg total) by mouth daily. 90 tablet 0   Continuous Glucose Receiver (FREESTYLE LIBRE 2 READER) DEVI Use to check blood sugar continuously throughout the day. E11.42 1 each 0   Continuous Glucose Sensor (FREESTYLE LIBRE 2 SENSOR) MISC Use to check blood sugar continuously throughout the day. Change sensors once every 14 days. E11.42 2 each 4   Insulin Glargine (BASAGLAR KWIKPEN) 100 UNIT/ML Inject 20 Units into the skin daily. 15 mL 0   Insulin Pen Needle 32G X 4 MM MISC 1 each by Other route daily. Use to inject insulin once daily. 100 each 0   losartan (COZAAR) 25 MG tablet Take 1 tablet (25 mg total) by mouth daily. 90 tablet 0   metFORMIN (GLUCOPHAGE-XR) 500 MG 24 hr tablet Take 1 tablet (500 mg total) by mouth 2 (two) times daily with a meal. 60 tablet 1   TRUEplus Lancets 28G MISC Use to check blood sugar 2 times a day 100 each 4   [DISCONTINUED] bromocriptine (PARLODEL) 2.5 MG tablet 1/4 tab daily (Patient not taking: Reported on 03/12/2019) 25 tablet 3   [DISCONTINUED] glimepiride (AMARYL) 1 MG tablet Take 0.5 tablets (0.5 mg total) by mouth daily with breakfast. (Patient not taking: Reported on 03/12/2019) 45 tablet 3   No current facility-administered medications on file prior to visit.    No Known Allergies  Social History   Socioeconomic History   Marital status: Divorced    Spouse name: Not on file   Number of children: 1   Years of education: 12+   Highest education level: 12th  grade  Occupational History    Employer: [employer]  Tobacco Use   Smoking status: Never    Passive exposure: Never   Smokeless tobacco: Never  Vaping Use   Vaping status: Never Used  Substance and Sexual Activity   Alcohol use: Yes    Comment: 1 or 2 every 3 months    Drug use: No   Sexual activity: Not on file  Other Topics Concern   Not on file  Social History Narrative   Lives at home with wife and stepson.   Caffeine use: none   Has one child.   Social  Determinants of Health   Financial Resource Strain: Low Risk  (03/12/2023)   Overall Financial Resource Strain (CARDIA)    Difficulty of Paying Living Expenses: Not very hard  Food Insecurity: No Food Insecurity (03/12/2023)   Hunger Vital Sign    Worried About Running Out of Food in the Last Year: Never true    Ran Out of Food in the Last Year: Never true  Transportation Needs: No Transportation Needs (03/12/2023)   PRAPARE - Administrator, Civil Service (Medical): No    Lack of Transportation (Non-Medical): No  Physical Activity: Sufficiently Active (03/12/2023)   Exercise Vital Sign    Days of Exercise per Week: 7 days    Minutes of Exercise per Session: 30 min  Stress: No Stress Concern Present (03/12/2023)   [employer] of Occupational Health - Occupational Stress Questionnaire    Feeling of Stress : Only a little  Social Connections: Unknown (03/16/2023)   Social Connection and Isolation Panel [NHANES]    Frequency of Communication with Friends and Family: Twice a week    Frequency of Social Gatherings with Friends and Family: Once a week    Attends Religious Services: Never    Database administrator or Organizations: No    Attends Engineer, structural: Never    Marital Status: Patient declined  Catering manager Violence: Not on file    Family History  Problem Relation Age of Onset   Diabetes Mother    Cancer Father        leukemia    Obesity Other     Past Surgical History:  Procedure Laterality Date   EYE SURGERY Left 2011   cataract   EYE SURGERY Right 2012    ROS: Review of Systems Negative except as stated above  PHYSICAL EXAM: BP 138/87   Pulse 90   Temp 98.9 F (37.2 C) (Oral)   Ht 5\' 10"  (1.778 m)   Wt 249 lb (112.9 kg)   SpO2 98%   BMI 35.73 kg/m   Physical Exam HENT:     Head: Normocephalic and atraumatic.     Nose: Nose normal.     Mouth/Throat:     Mouth: Mucous membranes are moist.     Pharynx: Oropharynx is  clear.  Eyes:     Extraocular Movements: Extraocular movements intact.     Conjunctiva/sclera: Conjunctivae normal.     Pupils: Pupils are equal, round, and reactive to light.  Cardiovascular:     Rate and Rhythm: Normal rate and regular rhythm.     Pulses: Normal pulses.     Heart sounds: Normal heart sounds.  Pulmonary:     Effort: Pulmonary effort is normal.     Breath sounds: Normal breath sounds.  Musculoskeletal:        General: Normal range of motion.     Cervical back: Normal  range of motion and neck supple.  Neurological:     General: No focal deficit present.     Mental Status: He is alert and oriented to person, place, and time.  Psychiatric:        Mood and Affect: Mood normal.        Behavior: Behavior normal.     ASSESSMENT AND PLAN: 1. Primary hypertension - Continue Losartan as prescribed. No refills needed as of present.  - Routine screening.  - Counseled on blood pressure goal of less than 130/80, low-sodium, DASH diet, medication compliance, and 150 minutes of moderate intensity exercise per week as tolerated. Counseled on medication adherence and adverse effects. - Follow-up with primary provider in 3 months or sooner if needed.  - Basic Metabolic Panel   Patient was given the opportunity to ask questions.  Patient verbalized understanding of the plan and was able to repeat key elements of the plan. Patient was given clear instructions to go to Emergency Department or return to medical center if symptoms don't improve, worsen, or new problems develop.The patient verbalized understanding.   Orders Placed This Encounter  Procedures   Basic Metabolic Panel     Return in about 3 months (around 01/05/2024) for Follow-Up or next available chronic conditions.  Rema Fendt, NP

## 2023-10-06 LAB — BASIC METABOLIC PANEL
BUN/Creatinine Ratio: 15 (ref 9–20)
BUN: 14 mg/dL (ref 6–24)
CO2: 24 mmol/L (ref 20–29)
Calcium: 9.1 mg/dL (ref 8.7–10.2)
Chloride: 104 mmol/L (ref 96–106)
Creatinine, Ser: 0.93 mg/dL (ref 0.76–1.27)
Glucose: 332 mg/dL — ABNORMAL HIGH (ref 70–99)
Potassium: 4.8 mmol/L (ref 3.5–5.2)
Sodium: 139 mmol/L (ref 134–144)
eGFR: 105 mL/min/{1.73_m2} (ref >59)

## 2023-10-23 ENCOUNTER — Ambulatory Visit: Payer: Self-pay | Admitting: Pharmacist

## 2023-10-24 ENCOUNTER — Encounter (HOSPITAL_COMMUNITY): Payer: Self-pay

## 2023-10-24 ENCOUNTER — Ambulatory Visit (HOSPITAL_COMMUNITY)
Admission: EM | Admit: 2023-10-24 | Discharge: 2023-10-24 | Disposition: A | Discharge status: Home / Self Care | Payer: Commercial Managed Care - PPO

## 2023-10-24 DIAGNOSIS — M542 Cervicalgia: Secondary | ICD-10-CM

## 2023-10-24 MED ORDER — METHOCARBAMOL 500 MG PO TABS: 500.0000 mg | ORAL_TABLET | Freq: Two times a day (BID) | ORAL | 0 refills | Status: DC | PRN

## 2023-10-24 MED ORDER — KETOROLAC TROMETHAMINE 30 MG/ML IJ SOLN
INTRAMUSCULAR | Status: AC
  Filled 2023-10-24: qty 1

## 2023-10-24 MED ORDER — KETOROLAC TROMETHAMINE 30 MG/ML IJ SOLN
30.0000 mg | Freq: Once | INTRAMUSCULAR | Status: AC
  Administered 2023-10-24: 30 mg via INTRAMUSCULAR

## 2023-10-24 NOTE — Discharge Instructions (Signed)
You were given a Toradol shot today in urgent care to help decrease pain and inflammation.  Do not take any ibuprofen, Advil, Aleve for at least 24 hours following injection.  I have also prescribed you a muscle relaxer to take as needed for your neck discomfort.  Please be advised that it can make you drowsy so do not drive or drink alcohol with taking it.  Follow-up if any symptoms persist or worsen.
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
## 2023-10-24 NOTE — Telephone Encounter (Signed)
Error

## 2023-12-13 ENCOUNTER — Telehealth: Payer: Self-pay | Admitting: Family

## 2023-12-13 NOTE — Telephone Encounter (Signed)
 error

## 2023-12-21 ENCOUNTER — Other Ambulatory Visit: Payer: Self-pay | Admitting: Family

## 2023-12-21 ENCOUNTER — Other Ambulatory Visit: Payer: Self-pay

## 2023-12-21 DIAGNOSIS — E1165 Type 2 diabetes mellitus with hyperglycemia: Secondary | ICD-10-CM

## 2023-12-21 MED ORDER — METFORMIN HCL ER 500 MG PO TB24: 500.0000 mg | ORAL_TABLET | Freq: Two times a day (BID) | ORAL | 1 refills | Status: DC

## 2023-12-21 NOTE — Telephone Encounter (Signed)
Requested medication (s) are due for refill today: No  Requested medication (s) are on the active medication list: Yes  Last refill:  10/24/23  Future visit scheduled: Yes  Notes to clinic:  Unable to refill per protocol, last refill by another provider.      Requested Prescriptions  Pending Prescriptions Disp Refills   amLODipine (NORVASC) 5 MG tablet [Pharmacy Med Name: AMLODIPINE BESYLATE 5MG  TABLETS] 90 tablet     Sig: TAKE 1 TABLET(5 MG) BY MOUTH DAILY     Cardiovascular: Calcium Channel Blockers 2 Failed - 12/21/2023  2:37 PM      Failed - Last BP in normal range    BP Readings from Last 1 Encounters:  10/24/23 (!) 185/99         Passed - Last Heart Rate in normal range    Pulse Readings from Last 1 Encounters:  10/24/23 94         Passed - Valid encounter within last 6 months    Recent Outpatient Visits           2 months ago Primary hypertension   [place] [facility], Amy J, NP   3 months ago Primary hypertension   [place] [facility], Amy J, NP   9 months ago Type 2 diabetes mellitus with diabetic polyneuropathy, with long-term current use of insulin (HCC)   [place] [facility] [place]. [facility] Drucilla Chalet, RPH-CPP   10 months ago Encounter to establish care   [facility], Amy J, NP   3 years ago Uncontrolled type 2 diabetes mellitus with diabetic cataract, without long-term current use of insulin [place])   [facility] at [facility], Asencion Partridge, MD       Future Appointments             In 2 weeks Rema Fendt, NP [place] [facility]            Refused Prescriptions Disp Refills   metFORMIN (GLUCOPHAGE-XR) 500 MG 24 hr tablet [Pharmacy Med Name: METFORMIN ER 500MG  24HR TABS] 60 tablet 1    Sig: TAKE 1 TABLET(500 MG) BY MOUTH TWICE DAILY WITH A MEAL     Endocrinology:   Diabetes - Biguanides Failed - 12/21/2023  2:37 PM      Failed - HBA1C is between 0 and 7.9 and within 180 days    HbA1c, POC (controlled diabetic range)  Date Value Ref Range Status  09/19/2023 12.7 (A) 0.0 - 7.0 % Final         Failed - B12 Level in normal range and within 720 days    No results found for: "VITAMINB12"       Failed - CBC within normal limits and completed in the last 12 months    WBC  Date Value Ref Range Status  11/13/2021 7.6 4.0 - 10.5 K/uL Final   RBC  Date Value Ref Range Status  11/13/2021 5.84 (H) 4.22 - 5.81 MIL/uL Final   Hemoglobin  Date Value Ref Range Status  11/13/2021 16.2 13.0 - 17.0 g/dL Final   HCT  Date Value Ref Range Status  11/13/2021 47.3 39.0 - 52.0 % Final   MCHC  Date Value Ref Range Status  11/13/2021 34.2 30.0 - 36.0 g/dL Final   [facility]  Date Value Ref Range Status  11/13/2021 27.7  26.0 - 34.0 pg Final   MCV  Date Value Ref Range Status  11/13/2021 81.0 80.0 - 100.0 fL Final   No results found for: "PLTCOUNTKUC", "LABPLAT", "POCPLA" RDW  Date Value Ref Range Status  11/13/2021 12.8 11.5 - 15.5 % Final         Passed - Cr in normal range and within 360 days    Creat  Date Value Ref Range Status  06/22/2016 0.98 0.60 - 1.35 mg/dL Final   Creatinine, Ser  Date Value Ref Range Status  10/05/2023 0.93 0.76 - 1.27 mg/dL Final         Passed - eGFR in normal range and within 360 days    GFR, Est African American  Date Value Ref Range Status  06/22/2016 >89 >=60 mL/min Final   GFR calc Af Amer  Date Value Ref Range Status  06/21/2020 143 >59 mL/min/1.73 Final    Comment:    **[facility] currently reports eGFR in compliance with the current**   recommendations of the [employer]. [facility] will   update reporting as new guidelines are published from the NKF-ASN   Task force.    GFR, Est Non African American  Date Value Ref Range Status  06/22/2016 >89 >=60 mL/min Final   GFR, Estimated  Date Value  Ref Range Status  11/13/2021 >60 >60 mL/min Final    Comment:    (NOTE) Calculated using the CKD-EPI Creatinine Equation (2021)    eGFR  Date Value Ref Range Status  10/05/2023 105 >59 mL/min/1.73 Final         Passed - Valid encounter within last 6 months    Recent Outpatient Visits           2 months ago Primary hypertension   [place] [facility], [place], NP   3 months ago Primary hypertension   [place] [facility] at [facility], Amy J, NP   9 months ago Type 2 diabetes mellitus with diabetic polyneuropathy, with long-term current use of insulin (HCC)   [place] [facility] [place]. [facility] Drucilla Chalet, RPH-CPP   10 months ago Encounter to establish care   [facility] at [facility], [place] J, NP   3 years ago Uncontrolled type 2 diabetes mellitus with diabetic cataract, without long-term current use of insulin [facility])   [facility] at [facility], Asencion Partridge, MD       Future Appointments             In 2 weeks Rema Fendt, NP [facility] at [facility]

## 2023-12-21 NOTE — Telephone Encounter (Signed)
Duplicate request medication ordered today. Requested Prescriptions  Pending Prescriptions Disp Refills   metFORMIN (GLUCOPHAGE-XR) 500 MG 24 hr tablet [Pharmacy Med Name: METFORMIN ER 500MG  24HR TABS] 60 tablet 1    Sig: TAKE 1 TABLET(500 MG) BY MOUTH TWICE DAILY WITH A MEAL     Endocrinology:  Diabetes - Biguanides Failed - 12/21/2023  2:36 PM      Failed - HBA1C is between 0 and 7.9 and within 180 days    HbA1c, POC (controlled diabetic range)  Date Value Ref Range Status  09/19/2023 12.7 (A) 0.0 - 7.0 % Final         Failed - B12 Level in normal range and within 720 days    No results found for: "VITAMINB12"       Failed - CBC within normal limits and completed in the last 12 months    WBC  Date Value Ref Range Status  11/13/2021 7.6 4.0 - 10.5 K/uL Final   RBC  Date Value Ref Range Status  11/13/2021 5.84 (H) 4.22 - 5.81 MIL/uL Final   Hemoglobin  Date Value Ref Range Status  11/13/2021 16.2 13.0 - 17.0 g/dL Final   HCT  Date Value Ref Range Status  11/13/2021 47.3 39.0 - 52.0 % Final   MCHC  Date Value Ref Range Status  11/13/2021 34.2 30.0 - 36.0 g/dL Final   [facility]  Date Value Ref Range Status  11/13/2021 27.7 26.0 - 34.0 pg Final   MCV  Date Value Ref Range Status  11/13/2021 81.0 80.0 - 100.0 fL Final   No results found for: "PLTCOUNTKUC", "LABPLAT", "POCPLA" RDW  Date Value Ref Range Status  11/13/2021 12.8 11.5 - 15.5 % Final         Passed - Cr in normal range and within 360 days    Creat  Date Value Ref Range Status  06/22/2016 0.98 0.60 - 1.35 mg/dL Final   Creatinine, Ser  Date Value Ref Range Status  10/05/2023 0.93 0.76 - 1.27 mg/dL Final         Passed - eGFR in normal range and within 360 days    GFR, Est African American  Date Value Ref Range Status  06/22/2016 >89 >=60 mL/min Final   GFR calc Af Amer  Date Value Ref Range Status  06/21/2020 143 >59 mL/min/1.73 Final    Comment:    **[facility] currently reports eGFR in compliance  with the current**   recommendations of the [employer]. [facility] will   update reporting as new guidelines are published from the NKF-ASN   Task force.    GFR, Est Non African American  Date Value Ref Range Status  06/22/2016 >89 >=60 mL/min Final   GFR, Estimated  Date Value Ref Range Status  11/13/2021 >60 >60 mL/min Final    Comment:    (NOTE) Calculated using the CKD-EPI Creatinine Equation (2021)    eGFR  Date Value Ref Range Status  10/05/2023 105 >59 mL/min/1.73 Final         Passed - Valid encounter within last 6 months    Recent Outpatient Visits           2 months ago Primary hypertension   [place] [facility], Amy J, NP   3 months ago Primary hypertension   [place] [facility] at [facility], Amy J, NP   9 months ago Type 2 diabetes mellitus with diabetic polyneuropathy, with long-term current use of insulin (  HCC)   [place] [facility] [place] [facility] [place]. [facility] Lois Huxley, Cornelius Moras, RPH-CPP   10 months ago Encounter to establish care   [facility] at [facility], [place] J, NP   3 years ago Uncontrolled type 2 diabetes mellitus with diabetic cataract, without long-term current use of insulin [facility])   [facility], Asencion Partridge, MD       Future Appointments             In 2 weeks Rema Fendt, NP [facility] at [facility]             amLODipine (NORVASC) 5 MG tablet [Pharmacy Med Name: AMLODIPINE BESYLATE 5MG  TABLETS] 90 tablet     Sig: TAKE 1 TABLET(5 MG) BY MOUTH DAILY     Cardiovascular: Calcium Channel Blockers 2 Failed - 12/21/2023  2:36 PM      Failed - Last BP in normal range    BP Readings from Last 1 Encounters:  10/24/23 (!) 185/99         Passed - Last Heart Rate in normal range    Pulse Readings from Last 1 Encounters:  10/24/23 94         Passed - Valid encounter within last 6 months     Recent Outpatient Visits           2 months ago Primary hypertension   [place] [facility], [place], NP   3 months ago Primary hypertension   [place] [facility], Amy J, NP   9 months ago Type 2 diabetes mellitus with diabetic polyneuropathy, with long-term current use of insulin (HCC)   [place] [facility] [place]. [facility] Drucilla Chalet, RPH-CPP   10 months ago Encounter to establish care   [facility] at [facility], [place] J, NP   3 years ago Uncontrolled type 2 diabetes mellitus with diabetic cataract, without long-term current use of insulin [facility])   [facility] at [facility], Asencion Partridge, MD       Future Appointments             In 2 weeks Rema Fendt, NP [facility] at [facility]

## 2023-12-27 ENCOUNTER — Telehealth: Payer: Self-pay | Admitting: Family

## 2023-12-27 NOTE — Telephone Encounter (Signed)
 Ian Hughes

## 2024-01-01 ENCOUNTER — Telehealth: Payer: Self-pay | Admitting: Family

## 2024-01-01 NOTE — Telephone Encounter (Signed)
Patient was identified as falling into the [place] Measure - Diabetes.   Patient was: Appointment scheduled with primary care provider in the next 30 days.  Patient had A1C CHECKED last with Primary Care has an appointment scheduled on 01/08/2024

## 2024-01-08 ENCOUNTER — Ambulatory Visit (INDEPENDENT_AMBULATORY_CARE_PROVIDER_SITE_OTHER): Payer: Commercial Managed Care - PPO | Admitting: Family

## 2024-01-08 VITALS — BP 184/103 | HR 88 | Temp 98.6°F | Resp 12 | Ht 70.0 in | Wt 254.6 lb

## 2024-01-08 DIAGNOSIS — G629 Polyneuropathy, unspecified: Secondary | ICD-10-CM | POA: Diagnosis not present

## 2024-01-08 DIAGNOSIS — E1165 Type 2 diabetes mellitus with hyperglycemia: Secondary | ICD-10-CM

## 2024-01-08 DIAGNOSIS — Z0189 Encounter for other specified special examinations: Secondary | ICD-10-CM

## 2024-01-08 DIAGNOSIS — R600 Localized edema: Secondary | ICD-10-CM

## 2024-01-08 DIAGNOSIS — Z7984 Long term (current) use of oral hypoglycemic drugs: Secondary | ICD-10-CM

## 2024-01-08 DIAGNOSIS — I10 Essential (primary) hypertension: Secondary | ICD-10-CM | POA: Diagnosis not present

## 2024-01-08 DIAGNOSIS — E119 Type 2 diabetes mellitus without complications: Secondary | ICD-10-CM

## 2024-01-08 MED ORDER — GABAPENTIN 300 MG PO CAPS: 300.0000 mg | ORAL_CAPSULE | Freq: Every day | ORAL | 0 refills | Status: DC

## 2024-01-08 MED ORDER — LOSARTAN POTASSIUM 50 MG PO TABS: 50.0000 mg | ORAL_TABLET | Freq: Every day | ORAL | 0 refills | Status: DC

## 2024-01-08 MED ORDER — HYDROCHLOROTHIAZIDE 12.5 MG PO TABS: 12.5000 mg | ORAL_TABLET | Freq: Every day | ORAL | 1 refills | Status: DC

## 2024-01-08 NOTE — Progress Notes (Signed)
Patient ID: Ian Hughes, male    DOB: [birth year]  MRN: [id number]  CC: Chronic Conditions Follow-Up  Subjective: Ian Hughes is a 43 y.o. male who presents for chronic conditions follow-up.   His concerns today include:  - Doing well on Losartan, no issues/concerns. He does not check blood pressure outside of office. Reports recently bilateral lower extremity swelling, no red flag symptoms. He does not complain of red flag symptoms such as but not limited to chest pain, shortness of breath, worst headache of life, nausea/vomiting.  - States he does not feel Metformin XR is helping. Reports home blood sugars above goal. He is watching what he eats. He denies red flag symptoms associated with diabetes. He has not established with Endocrinology. - Reports right hand difficulty "gripping". Denies red flag symptoms.    Patient Active Problem List   Diagnosis Date Noted   Type 2 diabetes mellitus with diabetic polyneuropathy, with long-term current use of insulin (HCC) 06/13/2022   Type 2 diabetes mellitus with hyperglycemia, with long-term current use of insulin (HCC) 06/13/2022   Type 2 diabetes mellitus with hyperosmolarity without coma, with long-term current use of insulin (HCC) 02/11/2020   Glucosuria 02/11/2020   Non-restorative sleep 02/11/2020   Excessive daytime sleepiness 02/11/2020   Sleep related headaches 02/11/2020   Nocturia more than twice per night 02/11/2020   UARS (upper airway resistance syndrome) 04/07/2015   Severe obesity (BMI >= 40) (HCC) 04/07/2015   Primary snoring 04/07/2015   Snoring 12/21/2014   Drowsiness 12/21/2014   Witnessed apneic spells 12/21/2014   ED (erectile dysfunction) 09/24/2012   Type 2 diabetes mellitus with obesity (HCC) 07/04/2009    Class: Chronic   OBESITY, NOS 01/17/2007   BELLS PALSY 01/17/2007   HYPERTENSION, BENIGN SYSTEMIC 01/17/2007     Current Outpatient Medications on File Prior to Visit  Medication Sig Dispense Refill    amLODipine (NORVASC) 5 MG tablet Take 5 mg by mouth daily.     atorvastatin (LIPITOR) 10 MG tablet Take 1 tablet (10 mg total) by mouth daily. 90 tablet 0   Continuous Glucose Receiver (FREESTYLE LIBRE 2 READER) DEVI Use to check blood sugar continuously throughout the day. E11.42 1 each 0   Continuous Glucose Sensor (FREESTYLE LIBRE 2 SENSOR) MISC Use to check blood sugar continuously throughout the day. Change sensors once every 14 days. E11.42 2 each 4   Insulin Glargine (BASAGLAR KWIKPEN) 100 UNIT/ML Inject 20 Units into the skin daily. 15 mL 0   Insulin Pen Needle 32G X 4 MM MISC 1 each by Other route daily. Use to inject insulin once daily. 100 each 0   metFORMIN (GLUCOPHAGE-XR) 500 MG 24 hr tablet Take 1 tablet (500 mg total) by mouth 2 (two) times daily with a meal. 60 tablet 1   methocarbamol (ROBAXIN) 500 MG tablet Take 1 tablet (500 mg total) by mouth 2 (two) times daily as needed for muscle spasms. 20 tablet 0   TRUEplus Lancets 28G MISC Use to check blood sugar 2 times a day 100 each 4   [DISCONTINUED] bromocriptine (PARLODEL) 2.5 MG tablet 1/4 tab daily (Patient not taking: Reported on 03/12/2019) 25 tablet 3   [DISCONTINUED] glimepiride (AMARYL) 1 MG tablet Take 0.5 tablets (0.5 mg total) by mouth daily with breakfast. (Patient not taking: Reported on 03/12/2019) 45 tablet 3   No current facility-administered medications on file prior to visit.    No Known Allergies  Social History   Socioeconomic History   Marital  status: Divorced    Spouse name: Not on file   Number of children: 1   Years of education: 12+   Highest education level: 12th grade  Occupational History    Employer: [employer]  Tobacco Use   Smoking status: Never    Passive exposure: Never   Smokeless tobacco: Never  Vaping Use   Vaping status: Never Used  Substance and Sexual Activity   Alcohol use: Yes    Comment: 1 or 2 every 3 months    Drug use: No   Sexual activity: Not on file  Other Topics  Concern   Not on file  Social History Narrative   Lives at home with wife and stepson.   Caffeine use: none   Has one child.   Social Drivers of Corporate investment banker Strain: Low Risk  (03/12/2023)   Overall Financial Resource Strain (CARDIA)    Difficulty of Paying Living Expenses: Not very hard  Food Insecurity: No Food Insecurity (03/12/2023)   Hunger Vital Sign    Worried About Running Out of Food in the Last Year: Never true    Ran Out of Food in the Last Year: Never true  Transportation Needs: No Transportation Needs (03/12/2023)   PRAPARE - Administrator, Civil Service (Medical): No    Lack of Transportation (Non-Medical): No  Physical Activity: Sufficiently Active (03/12/2023)   Exercise Vital Sign    Days of Exercise per Week: 7 days    Minutes of Exercise per Session: 30 min  Stress: No Stress Concern Present (03/12/2023)   [employer] of Occupational Health - Occupational Stress Questionnaire    Feeling of Stress : Only a little  Social Connections: Unknown (03/16/2023)   Social Connection and Isolation Panel [NHANES]    Frequency of Communication with Friends and Family: Twice a week    Frequency of Social Gatherings with Friends and Family: Once a week    Attends Religious Services: Never    Database administrator or Organizations: No    Attends Engineer, structural: Never    Marital Status: Patient declined  Catering manager Violence: Not on file    Family History  Problem Relation Age of Onset   Diabetes Mother    Cancer Father        leukemia    Obesity Other     Past Surgical History:  Procedure Laterality Date   EYE SURGERY Left 2011   cataract   EYE SURGERY Right 2012    ROS: Review of Systems Negative except as stated above  PHYSICAL EXAM: BP (!) 184/103   Pulse 88   Temp 98.6 F (37 C) (Oral)   Resp 12   Ht 5\' 10"  (1.778 m)   Wt 254 lb 9.6 oz (115.5 kg)   SpO2 99%   BMI 36.53 kg/m   Physical  Exam HENT:     Head: Normocephalic and atraumatic.     Nose: Nose normal.     Mouth/Throat:     Mouth: Mucous membranes are moist.     Pharynx: Oropharynx is clear.  Eyes:     Extraocular Movements: Extraocular movements intact.     Conjunctiva/sclera: Conjunctivae normal.     Pupils: Pupils are equal, round, and reactive to light.  Cardiovascular:     Rate and Rhythm: Normal rate and regular rhythm.     Pulses: Normal pulses.     Heart sounds: Normal heart sounds.  Pulmonary:  Effort: Pulmonary effort is normal.     Breath sounds: Normal breath sounds.  Musculoskeletal:        General: Normal range of motion.     Right shoulder: Normal.     Left shoulder: Normal.     Right upper arm: Normal.     Left upper arm: Normal.     Right elbow: Normal.     Left elbow: Normal.     Right forearm: Normal.     Left forearm: Normal.     Right wrist: Normal.     Left wrist: Normal.     Right hand: Normal.     Left hand: Normal.     Cervical back: Normal, normal range of motion and neck supple.     Thoracic back: Normal.     Lumbar back: Normal.     Right hip: Normal.     Left hip: Normal.     Right upper leg: Normal.     Left upper leg: Normal.     Right knee: Normal.     Left knee: Normal.     Right lower leg: 1+ Edema present.     Left lower leg: 1+ Edema present.     Right ankle: Swelling present.     Left ankle: Swelling present.     Right foot: Swelling present.     Left foot: Swelling present.     Comments: Edema +1 bilateral ankles/feet.  Neurological:     General: No focal deficit present.     Mental Status: He is alert and oriented to person, place, and time.  Psychiatric:        Mood and Affect: Mood normal.        Behavior: Behavior normal.     ASSESSMENT AND PLAN: 1. Primary hypertension (Primary) - Blood pressure not at goal during today's visit. Patient asymptomatic without chest pressure, chest pain, palpitations, shortness of breath, worst headache of  life, and any additional red flag symptoms. - Patient declined Hydralazine to be administered in office due to states he has another appointment.  - Increase Losartan from 25 mg to 50 mg as prescribed.  - Begin Hydrochlorothiazide as prescribed.  - Routine screening.   - Counseled on blood pressure goal of less than 130/80, low-sodium, DASH diet, medication compliance, and 150 minutes of moderate intensity exercise per week as tolerated. Counseled on medication adherence and adverse effects. - Referral to Cardiology for evaluation/management.  - Follow-up with primary provider in 2 weeks or sooner if needed.  - losartan (COZAAR) 50 MG tablet; Take 1 tablet (50 mg total) by mouth daily.  Dispense: 90 tablet; Refill: 0 - hydrochlorothiazide (HYDRODIURIL) 12.5 MG tablet; Take 1 tablet (12.5 mg total) by mouth daily.  Dispense: 30 tablet; Refill: 1 - Basic Metabolic Panel - Ambulatory referral to Cardiology  2. Type 2 diabetes mellitus with hyperglycemia, without long-term current use of insulin (HCC) - Continue Metformin XR as prescribed. Counseled on medication adherence/adverse effects.  - Hemoglobin A1c result pending. - Routine screening.  - Discussed the importance of healthy eating habits, low-carbohydrate diet, low-sugar diet, regular aerobic exercise (at least 150 minutes a week as tolerated) and medication compliance to achieve or maintain control of diabetes. - Referral to Endocrinology for evaluation/management.  - Follow-up with primary provider as scheduled. - Hemoglobin A1c - Microalbumin / creatinine urine ratio - Ambulatory referral to Endocrinology  3. Diabetic eye exam [facility]) - Referral to Ophthalmology for evaluation/management. - Ambulatory referral to Ophthalmology  4.  Encounter for diabetic foot exam [facility]) - Referral to Podiatry for evaluation/management. - Ambulatory referral to Podiatry  5. Neuropathy - Gabapentin as prescribed. Counseled on medication  adherence/adverse effects.  - Referral to Neurology for evaluation/management.  - Follow-up with primary provider as scheduled. - gabapentin (NEURONTIN) 300 MG capsule; Take 1 capsule (300 mg total) by mouth at bedtime.  Dispense: 90 capsule; Refill: 0 - Ambulatory referral to Neurology  6. Bilateral lower extremity edema - Routine screening.  - VAS [place] LOWER EXTREMITY VENOUS (DVT); Future    Patient was given the opportunity to ask questions.  Patient verbalized understanding of the plan and was able to repeat key elements of the plan. Patient was given clear instructions to go to Emergency Department or return to medical center if symptoms don't improve, worsen, or new problems develop.The patient verbalized understanding.   Orders Placed This Encounter  Procedures   Basic Metabolic Panel   Hemoglobin A1c   Microalbumin / creatinine urine ratio   Ambulatory referral to Cardiology   Ambulatory referral to Neurology   Ambulatory referral to Podiatry   Ambulatory referral to Ophthalmology   Ambulatory referral to Endocrinology   VAS [place] LOWER EXTREMITY VENOUS (DVT)     Requested Prescriptions   Signed Prescriptions Disp Refills   losartan (COZAAR) 50 MG tablet 90 tablet 0    Sig: Take 1 tablet (50 mg total) by mouth daily.   hydrochlorothiazide (HYDRODIURIL) 12.5 MG tablet 30 tablet 1    Sig: Take 1 tablet (12.5 mg total) by mouth daily.   gabapentin (NEURONTIN) 300 MG capsule 90 capsule 0    Sig: Take 1 capsule (300 mg total) by mouth at bedtime.    Return in about 2 weeks (around 01/22/2024) for Follow-Up or next available chronic conditions.  Rema Fendt, NP

## 2024-01-08 NOTE — Progress Notes (Signed)
Pt is here for swelling   Complaining of swelling in both legs and feet started Friday

## 2024-01-10 ENCOUNTER — Other Ambulatory Visit: Payer: Self-pay | Admitting: Family

## 2024-01-10 ENCOUNTER — Encounter: Payer: Self-pay | Admitting: Family

## 2024-01-10 DIAGNOSIS — E1165 Type 2 diabetes mellitus with hyperglycemia: Secondary | ICD-10-CM

## 2024-01-10 LAB — HEMOGLOBIN A1C
Est. average glucose Bld gHb Est-mCnc: 321 mg/dL
Hgb A1c MFr Bld: 12.8 % — ABNORMAL HIGH (ref 4.8–5.6)

## 2024-01-10 LAB — BASIC METABOLIC PANEL
BUN/Creatinine Ratio: 10 (ref 9–20)
BUN: 10 mg/dL (ref 6–24)
CO2: 26 mmol/L (ref 20–29)
Calcium: 9.2 mg/dL (ref 8.7–10.2)
Chloride: 99 mmol/L (ref 96–106)
Creatinine, Ser: 0.96 mg/dL (ref 0.76–1.27)
Glucose: 431 mg/dL — ABNORMAL HIGH (ref 70–99)
Potassium: 4.5 mmol/L (ref 3.5–5.2)
Sodium: 136 mmol/L (ref 134–144)
eGFR: 101 mL/min/{1.73_m2} (ref >59)

## 2024-01-10 LAB — MICROALBUMIN / CREATININE URINE RATIO
Creatinine, Urine: 43.9 mg/dL
Microalb/Creat Ratio: 904 mg/g{creat} — ABNORMAL HIGH (ref 0–29)
Microalbumin, Urine: 396.7 ug/mL

## 2024-01-10 MED ORDER — METFORMIN HCL ER 500 MG PO TB24: 1000.0000 mg | ORAL_TABLET | Freq: Two times a day (BID) | ORAL | 0 refills | Status: DC

## 2024-01-10 MED ORDER — INSULIN PEN NEEDLE 32G X 4 MM MISC: 1.0000 | Freq: Every day | 0 refills | Status: DC

## 2024-01-10 MED ORDER — BASAGLAR KWIKPEN 100 UNIT/ML ~~LOC~~ SOPN: 10.0000 [IU] | PEN_INJECTOR | Freq: Every day | SUBCUTANEOUS | 0 refills | Status: DC

## 2024-01-22 ENCOUNTER — Encounter: Payer: Self-pay | Admitting: Family

## 2024-01-22 ENCOUNTER — Other Ambulatory Visit: Payer: Self-pay | Admitting: Family

## 2024-01-22 ENCOUNTER — Ambulatory Visit (INDEPENDENT_AMBULATORY_CARE_PROVIDER_SITE_OTHER): Payer: Commercial Managed Care - PPO | Admitting: Family

## 2024-01-22 VITALS — BP 133/87 | HR 88 | Temp 99.3°F | Ht 70.0 in | Wt 251.8 lb

## 2024-01-22 DIAGNOSIS — E1165 Type 2 diabetes mellitus with hyperglycemia: Secondary | ICD-10-CM

## 2024-01-22 DIAGNOSIS — Z794 Long term (current) use of insulin: Secondary | ICD-10-CM | POA: Diagnosis not present

## 2024-01-22 DIAGNOSIS — E119 Type 2 diabetes mellitus without complications: Secondary | ICD-10-CM

## 2024-01-22 DIAGNOSIS — Z7984 Long term (current) use of oral hypoglycemic drugs: Secondary | ICD-10-CM

## 2024-01-22 DIAGNOSIS — I10 Essential (primary) hypertension: Secondary | ICD-10-CM | POA: Diagnosis not present

## 2024-01-22 LAB — POCT GLYCOSYLATED HEMOGLOBIN (HGB A1C): HbA1c, POC (controlled diabetic range): 14.1 % — AB (ref 0.0–7.0)

## 2024-01-22 MED ORDER — HYDROCHLOROTHIAZIDE 12.5 MG PO TABS: 12.5000 mg | ORAL_TABLET | Freq: Every day | ORAL | 0 refills | Status: DC

## 2024-01-22 MED ORDER — BASAGLAR KWIKPEN 100 UNIT/ML ~~LOC~~ SOPN: 10.0000 [IU] | PEN_INJECTOR | Freq: Every day | SUBCUTANEOUS | 0 refills | Status: DC

## 2024-01-22 MED ORDER — BASAGLAR KWIKPEN 100 UNIT/ML ~~LOC~~ SOPN: 13.0000 [IU] | PEN_INJECTOR | Freq: Every day | SUBCUTANEOUS | 0 refills | Status: DC

## 2024-01-22 MED ORDER — LOSARTAN POTASSIUM 50 MG PO TABS: 50.0000 mg | ORAL_TABLET | Freq: Every day | ORAL | 0 refills | Status: DC

## 2024-01-22 MED ORDER — METFORMIN HCL ER 500 MG PO TB24: 1000.0000 mg | ORAL_TABLET | Freq: Two times a day (BID) | ORAL | 0 refills | Status: DC

## 2024-01-22 NOTE — Progress Notes (Signed)
 Patient ID: AMEDEO DETWEILER, male    DOB: May 02, 1981  MRN: [id number]  CC: Chronic Conditions Follow-Up  Subjective: Ian Hughes is a 43 y.o. male who presents for chronic conditions follow-up.  His concerns today include:  - Doing well on Losartan and Hydrochlorothiazide, no issues/concerns. He does not complain of red flag symptoms such as but not limited to chest pain, shortness of breath, worst headache of life, nausea/vomiting.  - Doing well on Metformin XR and Insulin Glargine, no issues/concerns. He denies red flag symptoms associated with diabetes.    Patient Active Problem List   Diagnosis Date Noted   Type 2 diabetes mellitus with diabetic polyneuropathy, with long-term current use of insulin (HCC) 06/13/2022   Type 2 diabetes mellitus with hyperglycemia, with long-term current use of insulin (HCC) 06/13/2022   Type 2 diabetes mellitus with hyperosmolarity without coma, with long-term current use of insulin (HCC) 02/11/2020   Glucosuria 02/11/2020   Non-restorative sleep 02/11/2020   Excessive daytime sleepiness 02/11/2020   Sleep related headaches 02/11/2020   Nocturia more than twice per night 02/11/2020   UARS (upper airway resistance syndrome) 04/07/2015   Severe obesity (BMI >= 40) (HCC) 04/07/2015   Primary snoring 04/07/2015   Snoring 12/21/2014   Drowsiness 12/21/2014   Witnessed apneic spells 12/21/2014   ED (erectile dysfunction) 09/24/2012   Type 2 diabetes mellitus with obesity (HCC) 07/04/2009    Class: Chronic   OBESITY, NOS 01/17/2007   BELLS PALSY 01/17/2007   HYPERTENSION, BENIGN SYSTEMIC 01/17/2007     Current Outpatient Medications on File Prior to Visit  Medication Sig Dispense Refill   amLODipine (NORVASC) 5 MG tablet Take 5 mg by mouth daily.     atorvastatin (LIPITOR) 10 MG tablet Take 1 tablet (10 mg total) by mouth daily. 90 tablet 0   Continuous Glucose Receiver (FREESTYLE LIBRE 2 READER) DEVI Use to check blood sugar continuously  throughout the day. E11.42 1 each 0   Continuous Glucose Sensor (FREESTYLE LIBRE 2 SENSOR) MISC Use to check blood sugar continuously throughout the day. Change sensors once every 14 days. E11.42 2 each 4   gabapentin (NEURONTIN) 300 MG capsule Take 1 capsule (300 mg total) by mouth at bedtime. 90 capsule 0   Insulin Pen Needle 32G X 4 MM MISC 1 each by Other route daily. Use to inject insulin once daily. 100 each 0   TRUEplus Lancets 28G MISC Use to check blood sugar 2 times a day 100 each 4   methocarbamol (ROBAXIN) 500 MG tablet Take 1 tablet (500 mg total) by mouth 2 (two) times daily as needed for muscle spasms. (Patient not taking: Reported on 01/22/2024) 20 tablet 0   [DISCONTINUED] bromocriptine (PARLODEL) 2.5 MG tablet 1/4 tab daily (Patient not taking: Reported on 03/12/2019) 25 tablet 3   [DISCONTINUED] glimepiride (AMARYL) 1 MG tablet Take 0.5 tablets (0.5 mg total) by mouth daily with breakfast. (Patient not taking: Reported on 03/12/2019) 45 tablet 3   No current facility-administered medications on file prior to visit.    No Known Allergies  Social History   Socioeconomic History   Marital status: Divorced    Spouse name: Not on file   Number of children: 1   Years of education: 12+   Highest education level: 12th grade  Occupational History    Employer: [employer]  Tobacco Use   Smoking status: Never    Passive exposure: Never   Smokeless tobacco: Never  Vaping Use   Vaping status:  Never Used  Substance and Sexual Activity   Alcohol use: Yes    Comment: 1 or 2 every 3 months    Drug use: No   Sexual activity: Not on file  Other Topics Concern   Not on file  Social History Narrative   Lives at home with wife and stepson.   Caffeine use: none   Has one child.   Social Drivers of Corporate investment banker Strain: Low Risk  (03/12/2023)   Overall Financial Resource Strain (CARDIA)    Difficulty of Paying Living Expenses: Not very hard  Food Insecurity: No Food  Insecurity (03/12/2023)   Hunger Vital Sign    Worried About Running Out of Food in the Last Year: Never true    Ran Out of Food in the Last Year: Never true  Transportation Needs: No Transportation Needs (03/12/2023)   PRAPARE - Administrator, Civil Service (Medical): No    Lack of Transportation (Non-Medical): No  Physical Activity: Sufficiently Active (03/12/2023)   Exercise Vital Sign    Days of Exercise per Week: 7 days    Minutes of Exercise per Session: 30 min  Stress: No Stress Concern Present (03/12/2023)   [employer] of Occupational Health - Occupational Stress Questionnaire    Feeling of Stress : Only a little  Social Connections: Unknown (03/16/2023)   Social Connection and Isolation Panel [NHANES]    Frequency of Communication with Friends and Family: Twice a week    Frequency of Social Gatherings with Friends and Family: Once a week    Attends Religious Services: Never    Database administrator or Organizations: No    Attends Engineer, structural: Never    Marital Status: Patient declined  Catering manager Violence: Not on file    Family History  Problem Relation Age of Onset   Diabetes Mother    Cancer Father        leukemia    Obesity Other     Past Surgical History:  Procedure Laterality Date   EYE SURGERY Left 2011   cataract   EYE SURGERY Right 2012    ROS: Review of Systems Negative except as stated above  PHYSICAL EXAM: BP 133/87   Pulse 88   Temp 99.3 F (37.4 C) (Oral)   Ht 5\' 10"  (1.778 m)   Wt 251 lb 12.8 oz (114.2 kg)   SpO2 98%   BMI 36.13 kg/m   Physical Exam HENT:     Head: Normocephalic and atraumatic.     Nose: Nose normal.     Mouth/Throat:     Mouth: Mucous membranes are moist.     Pharynx: Oropharynx is clear.  Eyes:     Extraocular Movements: Extraocular movements intact.     Conjunctiva/sclera: Conjunctivae normal.     Pupils: Pupils are equal, round, and reactive to light.  Cardiovascular:      Rate and Rhythm: Normal rate and regular rhythm.     Pulses: Normal pulses.     Heart sounds: Normal heart sounds.  Pulmonary:     Effort: Pulmonary effort is normal.     Breath sounds: Normal breath sounds.  Musculoskeletal:        General: Normal range of motion.     Cervical back: Normal range of motion and neck supple.  Neurological:     General: No focal deficit present.     Mental Status: He is alert and oriented to person, place, and time.  Psychiatric:        Mood and Affect: Mood normal.        Behavior: Behavior normal.     ASSESSMENT AND PLAN: 1. Primary hypertension (Primary) - Continue Losartan and Hydrochlorothiazide as prescribed.  - Counseled on blood pressure goal of less than 130/80, low-sodium, DASH diet, medication compliance, and 150 minutes of moderate intensity exercise per week as tolerated. Counseled on medication adherence and adverse effects. - Follow-up with primary provider in 3 months or sooner if needed.  - losartan (COZAAR) 50 MG tablet; Take 1 tablet (50 mg total) by mouth daily.  Dispense: 90 tablet; Refill: 0 - hydrochlorothiazide (HYDRODIURIL) 12.5 MG tablet; Take 1 tablet (12.5 mg total) by mouth daily.  Dispense: 90 tablet; Refill: 0  2. Uncontrolled type 2 diabetes mellitus with hyperglycemia (HCC) - Continue Metformin XR and Insulin Glargine as prescribed. Counseled on medication adherence/adverse effects. - Hemoglobin A1c result pending.  - Discussed the importance of healthy eating habits, low-carbohydrate diet, low-sugar diet, regular aerobic exercise (at least 150 minutes a week as tolerated) and medication compliance to achieve or maintain control of diabetes. - Follow-up with primary provider as scheduled. - metFORMIN (GLUCOPHAGE-XR) 500 MG 24 hr tablet; Take 2 tablets (1,000 mg total) by mouth 2 (two) times daily with a meal.  Dispense: 360 tablet; Refill: 0 - Insulin Glargine (BASAGLAR KWIKPEN) 100 UNIT/ML; Inject 10 Units into the  skin at bedtime.  Dispense: 15 mL; Refill: 0 - POCT glycosylated hemoglobin (Hb A1C)  3. Diabetic eye exam [facility]) - Referral to Ophthalmology for evaluation/management. - Ambulatory referral to Ophthalmology  4. Encounter for diabetic foot exam [facility]) - Referral to Podiatry for evaluation/management. - Ambulatory referral to Podiatry    Patient was given the opportunity to ask questions.  Patient verbalized understanding of the plan and was able to repeat key elements of the plan. Patient was given clear instructions to go to Emergency Department or return to medical center if symptoms don't improve, worsen, or new problems develop.The patient verbalized understanding.   Orders Placed This Encounter  Procedures   Ambulatory referral to Ophthalmology   Ambulatory referral to Podiatry   POCT glycosylated hemoglobin (Hb A1C)     Requested Prescriptions   Signed Prescriptions Disp Refills   losartan (COZAAR) 50 MG tablet 90 tablet 0    Sig: Take 1 tablet (50 mg total) by mouth daily.   hydrochlorothiazide (HYDRODIURIL) 12.5 MG tablet 90 tablet 0    Sig: Take 1 tablet (12.5 mg total) by mouth daily.   metFORMIN (GLUCOPHAGE-XR) 500 MG 24 hr tablet 360 tablet 0    Sig: Take 2 tablets (1,000 mg total) by mouth 2 (two) times daily with a meal.   Insulin Glargine (BASAGLAR KWIKPEN) 100 UNIT/ML 15 mL 0    Sig: Inject 10 Units into the skin at bedtime.    Follow-up with primary provider as scheduled.  Rema Fendt, NP

## 2024-01-22 NOTE — Progress Notes (Signed)
 Patient states no concerns to discuss.

## 2024-01-30 ENCOUNTER — Ambulatory Visit (HOSPITAL_COMMUNITY)
Admission: RE | Admit: 2024-01-30 | Discharge: 2024-01-30 | Disposition: A | Discharge status: Home / Self Care | Source: Ambulatory Visit | Attending: Family | Admitting: Family

## 2024-01-30 DIAGNOSIS — R600 Localized edema: Secondary | ICD-10-CM | POA: Diagnosis not present

## 2024-01-31 ENCOUNTER — Encounter: Payer: Self-pay | Admitting: Family

## 2024-02-13 ENCOUNTER — Emergency Department (HOSPITAL_BASED_OUTPATIENT_CLINIC_OR_DEPARTMENT_OTHER): Admitting: Radiology

## 2024-02-13 ENCOUNTER — Other Ambulatory Visit: Payer: Self-pay

## 2024-02-13 ENCOUNTER — Encounter (HOSPITAL_BASED_OUTPATIENT_CLINIC_OR_DEPARTMENT_OTHER): Payer: Self-pay

## 2024-02-13 ENCOUNTER — Inpatient Hospital Stay (HOSPITAL_BASED_OUTPATIENT_CLINIC_OR_DEPARTMENT_OTHER)
Admission: EM | Admit: 2024-02-13 | Discharge: 2024-02-23 | DRG: 854 | Disposition: A | Discharge status: Home / Self Care | Attending: Internal Medicine | Admitting: Internal Medicine

## 2024-02-13 DIAGNOSIS — B9561 Methicillin susceptible Staphylococcus aureus infection as the cause of diseases classified elsewhere: Secondary | ICD-10-CM | POA: Diagnosis present

## 2024-02-13 DIAGNOSIS — L03115 Cellulitis of right lower limb: Secondary | ICD-10-CM | POA: Diagnosis not present

## 2024-02-13 DIAGNOSIS — M60061 Infective myositis, right lower leg: Secondary | ICD-10-CM | POA: Diagnosis present

## 2024-02-13 DIAGNOSIS — E1152 Type 2 diabetes mellitus with diabetic peripheral angiopathy with gangrene: Secondary | ICD-10-CM | POA: Diagnosis present

## 2024-02-13 DIAGNOSIS — I10 Essential (primary) hypertension: Secondary | ICD-10-CM | POA: Diagnosis present

## 2024-02-13 DIAGNOSIS — L02415 Cutaneous abscess of right lower limb: Secondary | ICD-10-CM | POA: Diagnosis present

## 2024-02-13 DIAGNOSIS — N179 Acute kidney failure, unspecified: Secondary | ICD-10-CM | POA: Diagnosis present

## 2024-02-13 DIAGNOSIS — Z833 Family history of diabetes mellitus: Secondary | ICD-10-CM

## 2024-02-13 DIAGNOSIS — E871 Hypo-osmolality and hyponatremia: Secondary | ICD-10-CM | POA: Diagnosis present

## 2024-02-13 DIAGNOSIS — E7800 Pure hypercholesterolemia, unspecified: Secondary | ICD-10-CM | POA: Diagnosis present

## 2024-02-13 DIAGNOSIS — A419 Sepsis, unspecified organism: Principal | ICD-10-CM | POA: Diagnosis present

## 2024-02-13 DIAGNOSIS — E66812 Obesity, class 2: Secondary | ICD-10-CM | POA: Diagnosis present

## 2024-02-13 DIAGNOSIS — Z79899 Other long term (current) drug therapy: Secondary | ICD-10-CM

## 2024-02-13 DIAGNOSIS — E1165 Type 2 diabetes mellitus with hyperglycemia: Secondary | ICD-10-CM | POA: Diagnosis present

## 2024-02-13 DIAGNOSIS — E1169 Type 2 diabetes mellitus with other specified complication: Secondary | ICD-10-CM

## 2024-02-13 DIAGNOSIS — E119 Type 2 diabetes mellitus without complications: Secondary | ICD-10-CM

## 2024-02-13 DIAGNOSIS — Z806 Family history of leukemia: Secondary | ICD-10-CM

## 2024-02-13 DIAGNOSIS — Z7984 Long term (current) use of oral hypoglycemic drugs: Secondary | ICD-10-CM

## 2024-02-13 DIAGNOSIS — L97219 Non-pressure chronic ulcer of right calf with unspecified severity: Secondary | ICD-10-CM | POA: Diagnosis present

## 2024-02-13 DIAGNOSIS — Z794 Long term (current) use of insulin: Secondary | ICD-10-CM

## 2024-02-13 DIAGNOSIS — Z23 Encounter for immunization: Secondary | ICD-10-CM

## 2024-02-13 DIAGNOSIS — I96 Gangrene, not elsewhere classified: Secondary | ICD-10-CM | POA: Diagnosis present

## 2024-02-13 DIAGNOSIS — Z6836 Body mass index (BMI) 36.0-36.9, adult: Secondary | ICD-10-CM

## 2024-02-13 DIAGNOSIS — L089 Local infection of the skin and subcutaneous tissue, unspecified: Principal | ICD-10-CM

## 2024-02-13 DIAGNOSIS — E669 Obesity, unspecified: Secondary | ICD-10-CM

## 2024-02-13 LAB — BASIC METABOLIC PANEL
Anion gap: 7 (ref 5–15)
BUN: 19 mg/dL (ref 6–20)
CO2: 28 mmol/L (ref 22–32)
Calcium: 8.9 mg/dL (ref 8.9–10.3)
Chloride: 94 mmol/L — ABNORMAL LOW (ref 98–111)
Creatinine, Ser: 1.44 mg/dL — ABNORMAL HIGH (ref 0.61–1.24)
GFR, Estimated: >60 mL/min (ref >60)
Glucose, Bld: 355 mg/dL — ABNORMAL HIGH (ref 70–99)
Potassium: 3.8 mmol/L (ref 3.5–5.1)
Sodium: 129 mmol/L — ABNORMAL LOW (ref 135–145)

## 2024-02-13 LAB — CBC WITH DIFFERENTIAL/PLATELET
Abs Immature Granulocytes: 0.1 10*3/uL — ABNORMAL HIGH (ref 0.00–0.07)
Basophils Absolute: 0 10*3/uL (ref 0.0–0.1)
Basophils Relative: 0 %
Eosinophils Absolute: 0.1 10*3/uL (ref 0.0–0.5)
Eosinophils Relative: 1 %
HCT: 36.6 % — ABNORMAL LOW (ref 39.0–52.0)
Hemoglobin: 12.6 g/dL — ABNORMAL LOW (ref 13.0–17.0)
Immature Granulocytes: 1 %
Lymphocytes Relative: 11 %
Lymphs Abs: 1.7 10*3/uL (ref 0.7–4.0)
MCH: 27.5 pg (ref 26.0–34.0)
MCHC: 34.4 g/dL (ref 30.0–36.0)
MCV: 79.7 fL — ABNORMAL LOW (ref 80.0–100.0)
Monocytes Absolute: 1.3 10*3/uL — ABNORMAL HIGH (ref 0.1–1.0)
Monocytes Relative: 9 %
Neutro Abs: 11.4 10*3/uL — ABNORMAL HIGH (ref 1.7–7.7)
Neutrophils Relative %: 78 %
Platelets: 388 10*3/uL (ref 150–400)
RBC: 4.59 MIL/uL (ref 4.22–5.81)
RDW: 12.9 % (ref 11.5–15.5)
WBC: 14.5 10*3/uL — ABNORMAL HIGH (ref 4.0–10.5)
nRBC: 0 % (ref 0.0–0.2)

## 2024-02-13 LAB — LACTIC ACID, PLASMA: Lactic Acid, Venous: 0.9 mmol/L (ref 0.5–1.9)

## 2024-02-13 NOTE — ED Triage Notes (Signed)
 Scrapped left leg on a wooden pallet 2 days ago. Wound draining slightly.   Says he cleaned it best he could.   Leg swelling since then. T2DM poorly controlled. A1c ~14.

## 2024-02-14 DIAGNOSIS — Z23 Encounter for immunization: Secondary | ICD-10-CM | POA: Diagnosis not present

## 2024-02-14 DIAGNOSIS — N179 Acute kidney failure, unspecified: Secondary | ICD-10-CM | POA: Diagnosis present

## 2024-02-14 DIAGNOSIS — Z7984 Long term (current) use of oral hypoglycemic drugs: Secondary | ICD-10-CM | POA: Diagnosis not present

## 2024-02-14 DIAGNOSIS — I96 Gangrene, not elsewhere classified: Secondary | ICD-10-CM | POA: Diagnosis present

## 2024-02-14 DIAGNOSIS — M60061 Infective myositis, right lower leg: Secondary | ICD-10-CM | POA: Diagnosis present

## 2024-02-14 DIAGNOSIS — L02415 Cutaneous abscess of right lower limb: Secondary | ICD-10-CM | POA: Diagnosis present

## 2024-02-14 DIAGNOSIS — S80811A Abrasion, right lower leg, initial encounter: Secondary | ICD-10-CM | POA: Diagnosis not present

## 2024-02-14 DIAGNOSIS — E1169 Type 2 diabetes mellitus with other specified complication: Secondary | ICD-10-CM | POA: Diagnosis not present

## 2024-02-14 DIAGNOSIS — L03115 Cellulitis of right lower limb: Secondary | ICD-10-CM | POA: Diagnosis present

## 2024-02-14 DIAGNOSIS — Z833 Family history of diabetes mellitus: Secondary | ICD-10-CM | POA: Diagnosis not present

## 2024-02-14 DIAGNOSIS — Z794 Long term (current) use of insulin: Secondary | ICD-10-CM | POA: Diagnosis not present

## 2024-02-14 DIAGNOSIS — I10 Essential (primary) hypertension: Secondary | ICD-10-CM | POA: Diagnosis present

## 2024-02-14 DIAGNOSIS — E1165 Type 2 diabetes mellitus with hyperglycemia: Secondary | ICD-10-CM | POA: Diagnosis present

## 2024-02-14 DIAGNOSIS — A419 Sepsis, unspecified organism: Secondary | ICD-10-CM | POA: Diagnosis present

## 2024-02-14 DIAGNOSIS — E66812 Obesity, class 2: Secondary | ICD-10-CM | POA: Diagnosis present

## 2024-02-14 DIAGNOSIS — E871 Hypo-osmolality and hyponatremia: Secondary | ICD-10-CM | POA: Diagnosis present

## 2024-02-14 DIAGNOSIS — L089 Local infection of the skin and subcutaneous tissue, unspecified: Secondary | ICD-10-CM | POA: Diagnosis not present

## 2024-02-14 DIAGNOSIS — E119 Type 2 diabetes mellitus without complications: Secondary | ICD-10-CM | POA: Diagnosis not present

## 2024-02-14 DIAGNOSIS — E7800 Pure hypercholesterolemia, unspecified: Secondary | ICD-10-CM | POA: Diagnosis present

## 2024-02-14 DIAGNOSIS — Z6836 Body mass index (BMI) 36.0-36.9, adult: Secondary | ICD-10-CM | POA: Diagnosis not present

## 2024-02-14 DIAGNOSIS — Z806 Family history of leukemia: Secondary | ICD-10-CM | POA: Diagnosis not present

## 2024-02-14 DIAGNOSIS — E1152 Type 2 diabetes mellitus with diabetic peripheral angiopathy with gangrene: Secondary | ICD-10-CM | POA: Diagnosis present

## 2024-02-14 DIAGNOSIS — E669 Obesity, unspecified: Secondary | ICD-10-CM | POA: Diagnosis not present

## 2024-02-14 DIAGNOSIS — B9561 Methicillin susceptible Staphylococcus aureus infection as the cause of diseases classified elsewhere: Secondary | ICD-10-CM | POA: Diagnosis present

## 2024-02-14 DIAGNOSIS — Z79899 Other long term (current) drug therapy: Secondary | ICD-10-CM | POA: Diagnosis not present

## 2024-02-14 DIAGNOSIS — L97219 Non-pressure chronic ulcer of right calf with unspecified severity: Secondary | ICD-10-CM | POA: Diagnosis present

## 2024-02-14 LAB — CBC
HCT: 36.4 % — ABNORMAL LOW (ref 39.0–52.0)
Hemoglobin: 12.4 g/dL — ABNORMAL LOW (ref 13.0–17.0)
MCH: 27.2 pg (ref 26.0–34.0)
MCHC: 34.1 g/dL (ref 30.0–36.0)
MCV: 79.8 fL — ABNORMAL LOW (ref 80.0–100.0)
Platelets: 381 10*3/uL (ref 150–400)
RBC: 4.56 MIL/uL (ref 4.22–5.81)
RDW: 12.8 % (ref 11.5–15.5)
WBC: 14.3 10*3/uL — ABNORMAL HIGH (ref 4.0–10.5)
nRBC: 0 % (ref 0.0–0.2)

## 2024-02-14 LAB — CREATININE, URINE, RANDOM: Creatinine, Urine: 74 mg/dL

## 2024-02-14 LAB — COMPREHENSIVE METABOLIC PANEL WITH GFR
ALT: 19 U/L (ref 0–44)
AST: 10 U/L — ABNORMAL LOW (ref 15–41)
Albumin: 3.3 g/dL — ABNORMAL LOW (ref 3.5–5.0)
Alkaline Phosphatase: 91 U/L (ref 38–126)
Anion gap: 7 (ref 5–15)
BUN: 19 mg/dL (ref 6–20)
CO2: 27 mmol/L (ref 22–32)
Calcium: 8.8 mg/dL — ABNORMAL LOW (ref 8.9–10.3)
Chloride: 98 mmol/L (ref 98–111)
Creatinine, Ser: 1.11 mg/dL (ref 0.61–1.24)
GFR, Estimated: >60 mL/min (ref >60)
Glucose, Bld: 290 mg/dL — ABNORMAL HIGH (ref 70–99)
Potassium: 3.3 mmol/L — ABNORMAL LOW (ref 3.5–5.1)
Sodium: 132 mmol/L — ABNORMAL LOW (ref 135–145)
Total Bilirubin: 0.4 mg/dL (ref 0.0–1.2)
Total Protein: 6.3 g/dL — ABNORMAL LOW (ref 6.5–8.1)

## 2024-02-14 LAB — CBG MONITORING, ED
Glucose-Capillary: 374 mg/dL — ABNORMAL HIGH (ref 70–99)
Glucose-Capillary: 118 mg/dL — ABNORMAL HIGH (ref 70–99)
Glucose-Capillary: 135 mg/dL — ABNORMAL HIGH (ref 70–99)

## 2024-02-14 LAB — SODIUM, URINE, RANDOM: Sodium, Ur: 24 mmol/L

## 2024-02-14 LAB — URINALYSIS, ROUTINE W REFLEX MICROSCOPIC
Bilirubin Urine: NEGATIVE
Glucose, UA: >1000 mg/dL — AB
Ketones, ur: NEGATIVE mg/dL
Leukocytes,Ua: NEGATIVE
Nitrite: NEGATIVE
Specific Gravity, Urine: 1.007 (ref 1.005–1.030)
pH: 5.5 (ref 5.0–8.0)

## 2024-02-14 LAB — GLUCOSE, CAPILLARY
Glucose-Capillary: 193 mg/dL — ABNORMAL HIGH (ref 70–99)
Glucose-Capillary: 212 mg/dL — ABNORMAL HIGH (ref 70–99)
Glucose-Capillary: 214 mg/dL — ABNORMAL HIGH (ref 70–99)
Glucose-Capillary: 261 mg/dL — ABNORMAL HIGH (ref 70–99)

## 2024-02-14 LAB — HIV ANTIBODY (ROUTINE TESTING W REFLEX): HIV Screen 4th Generation wRfx: NONREACTIVE

## 2024-02-14 MED ORDER — INSULIN ASPART 100 UNIT/ML IJ SOLN: 10.0000 [IU] | INTRAMUSCULAR | Status: DC

## 2024-02-14 MED ORDER — HYDROCHLOROTHIAZIDE 12.5 MG PO TABS
12.5000 mg | ORAL_TABLET | Freq: Every day | ORAL | Status: DC
  Administered 2024-02-14 – 2024-02-23 (×9): 12.5 mg via ORAL
  Filled 2024-02-14 (×9): qty 1

## 2024-02-14 MED ORDER — SODIUM CHLORIDE 0.9 % IV SOLN
1.0000 g | INTRAVENOUS | Status: DC
  Administered 2024-02-14 – 2024-02-15 (×2): 1 g via INTRAVENOUS
  Filled 2024-02-14 (×2): qty 10

## 2024-02-14 MED ORDER — INSULIN ASPART 100 UNIT/ML IJ SOLN: 0.0000 [IU] | Freq: Every day | INTRAMUSCULAR | Status: DC

## 2024-02-14 MED ORDER — OXYCODONE-ACETAMINOPHEN 5-325 MG PO TABS
1.0000 | ORAL_TABLET | ORAL | Status: DC | PRN
  Administered 2024-02-14 – 2024-02-23 (×19): 1 via ORAL
  Filled 2024-02-14 (×20): qty 1

## 2024-02-14 MED ORDER — ALBUTEROL SULFATE (2.5 MG/3ML) 0.083% IN NEBU: 2.5000 mg | INHALATION_SOLUTION | RESPIRATORY_TRACT | Status: DC | PRN

## 2024-02-14 MED ORDER — ONDANSETRON HCL 4 MG PO TABS: 4.0000 mg | ORAL_TABLET | Freq: Four times a day (QID) | ORAL | Status: DC | PRN

## 2024-02-14 MED ORDER — INSULIN ASPART 100 UNIT/ML IJ SOLN: 0.0000 [IU] | Freq: Three times a day (TID) | INTRAMUSCULAR | Status: DC

## 2024-02-14 MED ORDER — AMLODIPINE BESYLATE 5 MG PO TABS
5.0000 mg | ORAL_TABLET | Freq: Every day | ORAL | Status: DC
  Administered 2024-02-15 – 2024-02-23 (×8): 5 mg via ORAL
  Filled 2024-02-14 (×9): qty 1

## 2024-02-14 MED ORDER — INSULIN ASPART 100 UNIT/ML IJ SOLN
0.0000 [IU] | Freq: Every day | INTRAMUSCULAR | Status: DC
  Administered 2024-02-14 – 2024-02-15 (×2): 2 [IU] via SUBCUTANEOUS
  Administered 2024-02-18: 5 [IU] via SUBCUTANEOUS
  Administered 2024-02-19: 3 [IU] via SUBCUTANEOUS
  Administered 2024-02-20: 5 [IU] via SUBCUTANEOUS
  Administered 2024-02-21: 2 [IU] via SUBCUTANEOUS
  Administered 2024-02-22: 3 [IU] via SUBCUTANEOUS

## 2024-02-14 MED ORDER — ATORVASTATIN CALCIUM 10 MG PO TABS
10.0000 mg | ORAL_TABLET | Freq: Every day | ORAL | Status: DC
  Administered 2024-02-14 – 2024-02-22 (×9): 10 mg via ORAL
  Filled 2024-02-14 (×9): qty 1

## 2024-02-14 MED ORDER — ONDANSETRON HCL 4 MG/2ML IJ SOLN
4.0000 mg | Freq: Four times a day (QID) | INTRAMUSCULAR | Status: DC | PRN
  Administered 2024-02-20: 4 mg via INTRAVENOUS

## 2024-02-14 MED ORDER — INSULIN ASPART 100 UNIT/ML IJ SOLN
0.0000 [IU] | Freq: Four times a day (QID) | INTRAMUSCULAR | Status: DC
  Administered 2024-02-14: 15 [IU] via SUBCUTANEOUS

## 2024-02-14 MED ORDER — ACETAMINOPHEN 650 MG RE SUPP: 650.0000 mg | Freq: Four times a day (QID) | RECTAL | Status: DC | PRN

## 2024-02-14 MED ORDER — INSULIN GLARGINE-YFGN 100 UNIT/ML ~~LOC~~ SOLN
13.0000 [IU] | Freq: Every day | SUBCUTANEOUS | Status: DC
  Administered 2024-02-14 (×2): 13 [IU] via SUBCUTANEOUS
  Filled 2024-02-14: qty 130
  Filled 2024-02-14: qty 0.13

## 2024-02-14 MED ORDER — ENOXAPARIN SODIUM 40 MG/0.4ML IJ SOSY
40.0000 mg | PREFILLED_SYRINGE | INTRAMUSCULAR | Status: DC
  Administered 2024-02-14 – 2024-02-22 (×9): 40 mg via SUBCUTANEOUS
  Filled 2024-02-14 (×9): qty 0.4

## 2024-02-14 MED ORDER — LACTATED RINGERS IV BOLUS
1000.0000 mL | Freq: Once | INTRAVENOUS | Status: AC
  Administered 2024-02-14: 1000 mL via INTRAVENOUS

## 2024-02-14 MED ORDER — AMLODIPINE BESYLATE 5 MG PO TABS: 5.0000 mg | ORAL_TABLET | Freq: Every day | ORAL | Status: DC

## 2024-02-14 MED ORDER — BASAGLAR KWIKPEN 100 UNIT/ML ~~LOC~~ SOPN: 13.0000 [IU] | PEN_INJECTOR | Freq: Every day | SUBCUTANEOUS | Status: DC

## 2024-02-14 MED ORDER — SODIUM CHLORIDE 0.9 % IV SOLN
2.0000 g | Freq: Three times a day (TID) | INTRAVENOUS | Status: DC
  Administered 2024-02-14 (×2): 2 g via INTRAVENOUS
  Filled 2024-02-14 (×2): qty 12.5

## 2024-02-14 MED ORDER — LOSARTAN POTASSIUM 50 MG PO TABS
50.0000 mg | ORAL_TABLET | Freq: Every day | ORAL | Status: DC
  Administered 2024-02-14 – 2024-02-22 (×9): 50 mg via ORAL
  Filled 2024-02-14 (×9): qty 1

## 2024-02-14 MED ORDER — INSULIN ASPART 100 UNIT/ML IJ SOLN
0.0000 [IU] | Freq: Three times a day (TID) | INTRAMUSCULAR | Status: DC
  Administered 2024-02-14: 5 [IU] via SUBCUTANEOUS
  Administered 2024-02-15 (×2): 3 [IU] via SUBCUTANEOUS
  Administered 2024-02-15: 8 [IU] via SUBCUTANEOUS
  Administered 2024-02-16: 3 [IU] via SUBCUTANEOUS
  Administered 2024-02-16 (×2): 5 [IU] via SUBCUTANEOUS
  Administered 2024-02-17: 8 [IU] via SUBCUTANEOUS
  Administered 2024-02-17: 5 [IU] via SUBCUTANEOUS
  Administered 2024-02-17: 3 [IU] via SUBCUTANEOUS
  Administered 2024-02-18: 8 [IU] via SUBCUTANEOUS
  Administered 2024-02-18: 3 [IU] via SUBCUTANEOUS
  Administered 2024-02-18: 5 [IU] via SUBCUTANEOUS
  Administered 2024-02-19 (×2): 3 [IU] via SUBCUTANEOUS
  Administered 2024-02-19: 5 [IU] via SUBCUTANEOUS
  Administered 2024-02-20: 3 [IU] via SUBCUTANEOUS
  Administered 2024-02-20 – 2024-02-21 (×3): 5 [IU] via SUBCUTANEOUS
  Administered 2024-02-21: 8 [IU] via SUBCUTANEOUS
  Administered 2024-02-21: 2 [IU] via SUBCUTANEOUS
  Administered 2024-02-22 (×2): 5 [IU] via SUBCUTANEOUS
  Administered 2024-02-23: 11 [IU] via SUBCUTANEOUS

## 2024-02-14 MED ORDER — VANCOMYCIN HCL IN DEXTROSE 1-5 GM/200ML-% IV SOLN
1000.0000 mg | Freq: Once | INTRAVENOUS | Status: AC
  Administered 2024-02-14: 1000 mg via INTRAVENOUS
  Filled 2024-02-14: qty 200

## 2024-02-14 MED ORDER — ACETAMINOPHEN 325 MG PO TABS
650.0000 mg | ORAL_TABLET | Freq: Four times a day (QID) | ORAL | Status: DC | PRN
  Administered 2024-02-15 – 2024-02-16 (×2): 650 mg via ORAL
  Filled 2024-02-14 (×2): qty 2

## 2024-02-14 MED ORDER — INSULIN ASPART 100 UNIT/ML IJ SOLN
0.0000 [IU] | INTRAMUSCULAR | Status: DC
  Administered 2024-02-14: 8 [IU] via SUBCUTANEOUS
  Administered 2024-02-14: 2 [IU] via SUBCUTANEOUS

## 2024-02-14 MED ORDER — LACTATED RINGERS IV SOLN: INTRAVENOUS | Status: DC

## 2024-02-14 MED ORDER — VANCOMYCIN HCL 750 MG/150ML IV SOLN
750.0000 mg | Freq: Two times a day (BID) | INTRAVENOUS | Status: DC
  Filled 2024-02-14: qty 150

## 2024-02-14 MED ORDER — VANCOMYCIN HCL IN DEXTROSE 1-5 GM/200ML-% IV SOLN
1000.0000 mg | Freq: Two times a day (BID) | INTRAVENOUS | Status: DC
  Administered 2024-02-14 – 2024-02-15 (×2): 1000 mg via INTRAVENOUS
  Filled 2024-02-14 (×2): qty 200

## 2024-02-14 NOTE — H&P (Signed)
 History and Physical  Ian Hughes NWG:[id number] DOB: April 27, 1981 DOA: 02/13/2024  PCP: Rema Fendt, NP   Chief Complaint: Left leg infection  HPI: Ian Hughes is a 43 y.o. male with medical history significant for poorly controlled insulin-dependent type 2 diabetes, obesity, hyperlipidemia being admitted to the hospital with left lower leg cellulitis.  He scratched his right lower leg about 3 days ago on a pallet at work.  Cleaned it up when he got home as best he could, but came to the ER yesterday due to associated leg swelling.  Some scant light yellow drainage from the site.  He denies any stomach symptoms such as fevers, chills, nausea, vomiting.  Workup as noted below without evidence of sepsis, or deeper infection.  He was started on empiric IV antibiotics and admitted to the hospitalist service at [facility].  Review of Systems: Please see HPI for pertinent positives and negatives. A complete 10 system review of systems are otherwise negative.  Past Medical History:  Diagnosis Date   Cataract 2011   surgery on R eye   Diabetes mellitus without complication (HCC)    High cholesterol    Obesity    Past Surgical History:  Procedure Laterality Date   EYE SURGERY Left 2011   cataract   EYE SURGERY Right 2012   Social History:  reports that he has never smoked. He has never been exposed to tobacco smoke. He has never used smokeless tobacco. He reports current alcohol use. He reports that he does not use drugs.  No Known Allergies  Family History  Problem Relation Age of Onset   Diabetes Mother    Cancer Father        leukemia    Obesity Other      Prior to Admission medications   Medication Sig Start Date End Date Taking? Authorizing Provider  acetaminophen (TYLENOL) 500 MG tablet Take 500 mg by mouth every 6 (six) hours as needed.   Yes [provider]  amLODipine (NORVASC) 5 MG tablet Take 5 mg by mouth daily. 09/24/23  Yes [provider]  atorvastatin  (LIPITOR) 10 MG tablet Take 1 tablet (10 mg total) by mouth daily. 09/19/23  Yes Zonia Kief, Amy J, NP  Continuous Glucose Receiver (FREESTYLE LIBRE 2 READER) DEVI Use to check blood sugar continuously throughout the day. E11.42 03/16/23  Yes Hoy Register, MD  Continuous Glucose Sensor (FREESTYLE LIBRE 2 SENSOR) MISC Use to check blood sugar continuously throughout the day. Change sensors once every 14 days. E11.42 03/16/23  Yes Hoy Register, MD  gabapentin (NEURONTIN) 300 MG capsule Take 1 capsule (300 mg total) by mouth at bedtime. 01/08/24  Yes Zonia Kief, Amy J, NP  hydrochlorothiazide (HYDRODIURIL) 12.5 MG tablet Take 1 tablet (12.5 mg total) by mouth daily. 01/22/24 04/21/24 Yes Zonia Kief, Amy J, NP  Insulin Glargine (BASAGLAR KWIKPEN) 100 UNIT/ML Inject 13 Units into the skin at bedtime. 01/22/24  Yes Zonia Kief, Amy J, NP  losartan (COZAAR) 50 MG tablet Take 1 tablet (50 mg total) by mouth daily. 01/22/24 04/21/24 Yes Zonia Kief, Amy J, NP  metFORMIN (GLUCOPHAGE-XR) 500 MG 24 hr tablet Take 2 tablets (1,000 mg total) by mouth 2 (two) times daily with a meal. 01/22/24 04/21/24 Yes Zonia Kief, Amy J, NP  Insulin Pen Needle 32G X 4 MM MISC 1 each by Other route daily. Use to inject insulin once daily. 01/10/24   Rema Fendt, NP  methocarbamol (ROBAXIN) 500 MG tablet Take 1 tablet (500 mg total) by mouth  2 (two) times daily as needed for muscle spasms. Patient not taking: Reported on 01/22/2024 10/24/23   Gustavus Bryant, FNP  TRUEplus Lancets 28G MISC Use to check blood sugar 2 times a day 03/16/23   Rema Fendt, NP  bromocriptine (PARLODEL) 2.5 MG tablet 1/4 tab daily Patient not taking: Reported on 03/12/2019 01/11/18 10/06/19  Romero Belling, MD  glimepiride (AMARYL) 1 MG tablet Take 0.5 tablets (0.5 mg total) by mouth daily with breakfast. Patient not taking: Reported on 03/12/2019 01/11/18 10/06/19  Romero Belling, MD    Physical Exam: BP (!) 149/88 (BP Location: Right Arm)   Pulse 95   Temp 98 F (36.7 C)  (Oral)   Resp 17   Ht 5\' 10"  (1.778 m)   Wt 112.9 kg   SpO2 100%   BMI 35.73 kg/m  General:  Alert, oriented, calm, in no acute distress, looks nontoxic.  His mother is at the bedside. Cardiovascular: RRR, no murmurs or rubs, no peripheral edema  Respiratory: clear to auscultation bilaterally, no wheezes, no crackles  Abdomen: soft, nontender, nondistended, normal bowel tones heard  Skin: dry, no rashes right lower extremity wound as pictured below.  No areas of fluctuance, somewhat tender to palpation.  No active drainage. Musculoskeletal: no joint effusions, normal range of motion  Psychiatric: appropriate affect, normal speech  Neurologic: extraocular muscles intact, clear speech, moving all extremities with intact sensorium              Labs on Admission:  Basic Metabolic Panel: Recent Labs  Lab 02/13/24 2313 02/14/24 0418  NA 129* 132*  K 3.8 3.3*  CL 94* 98  CO2 28 27  GLUCOSE 355* 290*  BUN 19 19  CREATININE 1.44* 1.11  CALCIUM 8.9 8.8*   Liver Function Tests: Recent Labs  Lab 02/14/24 0418  AST 10*  ALT 19  ALKPHOS 91  BILITOT 0.4  PROT 6.3*  ALBUMIN 3.3*   No results for input(s): "LIPASE", "AMYLASE" in the last 168 hours. No results for input(s): "AMMONIA" in the last 168 hours. CBC: Recent Labs  Lab 02/13/24 2313 02/14/24 0418  WBC 14.5* 14.3*  NEUTROABS 11.4*  --   HGB 12.6* 12.4*  HCT 36.6* 36.4*  MCV 79.7* 79.8*  PLT 388 381   Cardiac Enzymes: No results for input(s): "CKTOTAL", "CKMB", "CKMBINDEX", "TROPONINI" in the last 168 hours. BNP (last 3 results) No results for input(s): "BNP" in the last 8760 hours.  ProBNP (last 3 results) No results for input(s): "PROBNP" in the last 8760 hours.  CBG: Recent Labs  Lab 02/14/24 0153 02/14/24 0802 02/14/24 1042  GLUCAP 374* 118* 135*    Radiological Exams on Admission: DG Tibia/Fibula Right Result Date: 02/13/2024 CLINICAL DATA:  Injuries, skin infection. EXAM: RIGHT TIBIA AND  FIBULA - 2 VIEW COMPARISON:  None Available. FINDINGS: There is no evidence of fracture or other focal bone lesions. There is diffuse soft tissue swelling of the lower extremity. No definite radiopaque foreign body identified. IMPRESSION: Diffuse soft tissue swelling of the lower extremity. No definite radiopaque foreign body identified. Electronically Signed   By: Darliss Cheney M.D.   On: 02/13/2024 23:32   Assessment/Plan Ian Hughes is a 43 y.o. male with medical history significant for poorly controlled insulin-dependent type 2 diabetes, obesity, hyperlipidemia being admitted to the hospital with left lower leg cellulitis.  Cellulitis-with leukocytosis but no evidence of sepsis, deeper infection or endorgan dysfunction.  He is at risk for worsening infection due to his poorly  controlled diabetes. -Inpatient admission -Empiric IV vancomycin and IV ceftriaxone -X-ray as above without subcutaneous gas or evidence of osteomyelitis -Follow-up peripheral blood cultures  Type 2 diabetes-last hemoglobin A1c earlier this month 14.1, significant hyperglycemia without acidosis on initial presentation, presumably due to acute infection -Carb modified diet -Moderate dose sliding scale in addition to 13 units Semglee daily -Holding home metformin  Pseudohyponatremia-due to hyperglycemia, improving  Hypertension-Home medications losartan and HCTZ continued  Obesity-BMI 36, complicating all aspects of care  Hyperlipidemia-continue home statin  DVT prophylaxis: Lovenox     Code Status: Full Code  Consults called: None  Admission status: The appropriate patient status for this patient is INPATIENT. Inpatient status is judged to be reasonable and necessary in order to provide the required intensity of service to ensure the patient's safety. The patient's presenting symptoms, physical exam findings, and initial radiographic and laboratory data in the context of their chronic comorbidities is felt to  place them at high risk for further clinical deterioration. Furthermore, it is not anticipated that the patient will be medically stable for discharge from the hospital within 2 midnights of admission.    I certify that at the point of admission it is my clinical judgment that the patient will require inpatient hospital care spanning beyond 2 midnights from the point of admission due to high intensity of service, high risk for further deterioration and high frequency of surveillance required  Time spent: 59 minutes  Lillan Mccreadie Sharlette Dense MD [facility] Pager [phone]  If 7PM-7AM, please contact night-coverage [email or web address] Password [facility]  02/14/2024, 12:57 PM

## 2024-02-14 NOTE — Progress Notes (Addendum)
  Elevated blood glucose around 355.  Patient also has AKI elevated creatinine 1.44.  Giving 1 L of LR bolus, initiating long-acting home insulin regimen 13 units at bedtime, continue to check POC blood glucose every 4 hours and high sliding scale insulin accordingly.  Once blood glucose is well-controlled can transition to subcu insulin 3 times daily with meal and at bedtime.  Consulted pharmacy for IV vancomycin and cefepime for the management of right lower extremity diabetic wound/cellulitis.  Blood cultures are pending.  Tereasa Coop, MD [facility] 02/14/2024, 1:35 AM

## 2024-02-14 NOTE — Plan of Care (Signed)

## 2024-02-14 NOTE — Plan of Care (Addendum)
 Plan of Care Note for accepted transfer  Patient: Ian Hughes              KGM:[id number]  DOA: 02/13/2024     Facility requesting transfer: [facility] emergency department Requesting Provider: Dr. Oletta Cohn  Reason for transfer: Right-sided calf diabetic wound/cellulitis, hyperglycemic crisis and acute kidney injury   Facility course: 43 year old man history of essentia htn, hld, DM type II and OSA presented to emergency department complaining of right lower leg wound since patient scribed left leg on a wooden pallet 2 days ago. Patient reported he has scraped his leg with a wooden pallet noticing increasing swelling and redness of the right lower extremity.   At presentation to ED hemodynamically stable. Blood cultures are pending. Normal lactic acid level. CBC showing leukocytosis 14.5, hemoglobin 12.6, hematocrit 36, low MCV 79 and normal platelet count. BMP showing low sodium 129, low chloride 94, elevated blood glucose 355, elevated creatinine 1.44.  X-ray of the right tibia-fibula showing diffuse soft tissue swelling of the right lower extremity.  No definitive foreign body identified.  Patient has poorly controlled DM and A1c is around 14. Per Dr. Oletta Cohn patient has an blackish eschar of the right sided medial calf area however there is no gas formation or crepitus.  X-ray also did not show any evidence of gas formation there is no concern for development of necrotizing fasciitis.  Pharmacy has been consulted recommended IV vancomycin.  Discussed with Dr. Oletta Cohn to start IV cefepime or Zosyn alongside with continue IV vancomycin.  Per Dr. Oletta Cohn the right lower leg lateral aspect has a large eschar with surrounding erythema and induration.  Entire lower leg is swollen.  Hospitalist has been consulted for further management evaluation of right-sided mid calf cellulitis/diabetic wound, AKI and hyperglycemic crisis.  Plan of care: The patient is accepted for admission for inpatient  status to Telemetry unit, at [facility].  [facility] will assume care on arrival to accepting facility. Until arrival, care as per EDP. However, [facility] available 24/7 for questions and assistance.   Check [email or web address] for on-call coverage.   Nursing staff, please call [facility] Admits & Consults System-Wide number under Amion on patient's arrival so appropriate admitting provider can evaluate the pt.    Author: Tereasa Coop, MD  02/14/2024  [facility]

## 2024-02-14 NOTE — Progress Notes (Signed)
 Pharmacy Antibiotic Note  Ian Hughes is a 43 y.o. male admitted on 02/13/2024 with Right lower extremity wound/cellulitis. Pharmacy has been consulted for cefepime/vancomycin dosing. Pt is afebrile and WBC is elevated. SCr has improved to 1.11.   Plan: Continue cefepime 2g IV Q8H Change vancomycin to 1g IV Q12H F/u renal fxn, C&S, clinical status and peak/trough at SS  Height: 5\' 10"  (177.8 cm) Weight: 112.9 kg (249 lb) IBW/kg (Calculated) : 73  Temp (24hrs), Avg:98.6 F (37 C), Min:98 F (36.7 C), Max:99.5 F (37.5 C)  Recent Labs  Lab 02/13/24 2313 02/14/24 0418  WBC 14.5* 14.3*  CREATININE 1.44* 1.11  LATICACIDVEN 0.9  --     Estimated Creatinine Clearance: 109.1 mL/min (by C-G formula based on SCr of 1.11 mg/dL).    No Known Allergies  Antimicrobials this admission: Cefepime 3/27 >>  Vancomycin 3/27 >>   Microbiology results: 3/27 BCx:   Thank you for allowing pharmacy to be a part of this patient's care.  Lysle Pearl, PharmD, BCPS, BCEMP Clinical Pharmacist Please see AMION for all pharmacy numbers 02/14/2024 8:00 AM

## 2024-02-14 NOTE — ED Notes (Signed)
 Pt given two sticks of cheddar cheese and a zero sugar ginger ale

## 2024-02-14 NOTE — Plan of Care (Signed)
 Problem: Education: Goal: Ability to describe self-care measures that may prevent or decrease complications (Diabetes Survival Skills Education) will improve 02/14/2024 2302 by Hortencia Pilar, RN Outcome: Progressing 02/14/2024 2243 by Hortencia Pilar, RN Outcome: Progressing Goal: Individualized Educational Video(s) 02/14/2024 2302 by Hortencia Pilar, RN Outcome: Progressing 02/14/2024 2243 by Hortencia Pilar, RN Outcome: Progressing   Problem: Coping: Goal: Ability to adjust to condition or change in health will improve 02/14/2024 2302 by Hortencia Pilar, RN Outcome: Progressing 02/14/2024 2243 by Hortencia Pilar, RN Outcome: Progressing   Problem: Fluid Volume: Goal: Ability to maintain a balanced intake and output will improve 02/14/2024 2302 by Hortencia Pilar, RN Outcome: Progressing 02/14/2024 2243 by Hortencia Pilar, RN Outcome: Progressing   Problem: Health Behavior/Discharge Planning: Goal: Ability to identify and utilize available resources and services will improve 02/14/2024 2302 by Hortencia Pilar, RN Outcome: Progressing 02/14/2024 2243 by Hortencia Pilar, RN Outcome: Progressing Goal: Ability to manage health-related needs will improve 02/14/2024 2302 by Hortencia Pilar, RN Outcome: Progressing 02/14/2024 2243 by Hortencia Pilar, RN Outcome: Progressing   Problem: Metabolic: Goal: Ability to maintain appropriate glucose levels will improve 02/14/2024 2302 by Hortencia Pilar, RN Outcome: Progressing 02/14/2024 2243 by Hortencia Pilar, RN Outcome: Progressing   Problem: Nutritional: Goal: Maintenance of adequate nutrition will improve 02/14/2024 2302 by Hortencia Pilar, RN Outcome: Progressing 02/14/2024 2243 by Hortencia Pilar, RN Outcome: Progressing Goal: Progress toward achieving an optimal weight will improve 02/14/2024 2302 by Hortencia Pilar, RN Outcome: Progressing 02/14/2024 2243 by Hortencia Pilar, RN Outcome: Progressing   Problem:  Skin Integrity: Goal: Risk for impaired skin integrity will decrease 02/14/2024 2302 by Hortencia Pilar, RN Outcome: Progressing 02/14/2024 2243 by Hortencia Pilar, RN Outcome: Progressing   Problem: Tissue Perfusion: Goal: Adequacy of tissue perfusion will improve 02/14/2024 2302 by Hortencia Pilar, RN Outcome: Progressing 02/14/2024 2243 by Hortencia Pilar, RN Outcome: Progressing   Problem: Education: Goal: Knowledge of General Education information will improve Description: Including pain rating scale, medication(s)/side effects and non-pharmacologic comfort measures 02/14/2024 2302 by Hortencia Pilar, RN Outcome: Progressing 02/14/2024 2243 by Hortencia Pilar, RN Outcome: Progressing   Problem: Health Behavior/Discharge Planning: Goal: Ability to manage health-related needs will improve 02/14/2024 2302 by Hortencia Pilar, RN Outcome: Progressing 02/14/2024 2243 by Hortencia Pilar, RN Outcome: Progressing   Problem: Clinical Measurements: Goal: Ability to maintain clinical measurements within normal limits will improve 02/14/2024 2302 by Hortencia Pilar, RN Outcome: Progressing 02/14/2024 2243 by Hortencia Pilar, RN Outcome: Progressing Goal: Will remain free from infection 02/14/2024 2302 by Hortencia Pilar, RN Outcome: Progressing 02/14/2024 2243 by Hortencia Pilar, RN Outcome: Progressing Goal: Diagnostic test results will improve 02/14/2024 2302 by Hortencia Pilar, RN Outcome: Progressing 02/14/2024 2243 by Hortencia Pilar, RN Outcome: Progressing Goal: Respiratory complications will improve 02/14/2024 2302 by Hortencia Pilar, RN Outcome: Progressing 02/14/2024 2243 by Hortencia Pilar, RN Outcome: Progressing Goal: Cardiovascular complication will be avoided 02/14/2024 2302 by Hortencia Pilar, RN Outcome: Progressing 02/14/2024 2243 by Hortencia Pilar, RN Outcome: Progressing   Problem: Activity: Goal: Risk for activity intolerance will  decrease 02/14/2024 2302 by Hortencia Pilar, RN Outcome: Progressing 02/14/2024 2243 by Hortencia Pilar, RN Outcome: Progressing   Problem: Nutrition: Goal: Adequate nutrition will be maintained 02/14/2024 2302 by Hortencia Pilar, RN Outcome: Progressing 02/14/2024 2243 by Hortencia Pilar, RN Outcome: Progressing  Problem: Coping: Goal: Level of anxiety will decrease 02/14/2024 2302 by Hortencia Pilar, RN Outcome: Progressing 02/14/2024 2243 by Hortencia Pilar, RN Outcome: Progressing   Problem: Elimination: Goal: Will not experience complications related to bowel motility 02/14/2024 2302 by Hortencia Pilar, RN Outcome: Progressing 02/14/2024 2243 by Hortencia Pilar, RN Outcome: Progressing Goal: Will not experience complications related to urinary retention 02/14/2024 2302 by Hortencia Pilar, RN Outcome: Progressing 02/14/2024 2243 by Hortencia Pilar, RN Outcome: Progressing   Problem: Pain Managment: Goal: General experience of comfort will improve and/or be controlled 02/14/2024 2302 by Hortencia Pilar, RN Outcome: Progressing 02/14/2024 2243 by Hortencia Pilar, RN Outcome: Progressing   Problem: Safety: Goal: Ability to remain free from injury will improve 02/14/2024 2302 by Hortencia Pilar, RN Outcome: Progressing 02/14/2024 2243 by Hortencia Pilar, RN Outcome: Progressing   Problem: Skin Integrity: Goal: Risk for impaired skin integrity will decrease 02/14/2024 2302 by Hortencia Pilar, RN Outcome: Progressing 02/14/2024 2243 by Hortencia Pilar, RN Outcome: Progressing

## 2024-02-14 NOTE — Inpatient Diabetes Management (Signed)
 Inpatient Diabetes Program Recommendations  AACE/ADA: New Consensus Statement on Inpatient Glycemic Control (2015)  Target Ranges:  Prepandial:   less than 140 mg/dL      Peak postprandial:   less than 180 mg/dL (1-2 hours)      Critically ill patients:  140 - 180 mg/dL   Lab Results  Component Value Date   GLUCAP 193 (H) 02/14/2024   HGBA1C 14.1 (A) 01/22/2024    Review of Glycemic Control  Latest Reference Range & Units 02/14/24 13:04  Glucose-Capillary 70 - 99 mg/dL 409 (H)  (H): Data is abnormally high Diabetes history: Type 2 DM Outpatient Diabetes medications: Basaglar 13 units at bedtime, Metformin 1000 mg BID Current orders for Inpatient glycemic control: Semglee 13 units at bedtime, Novolog 0-15 units TID & HS  Inpatient Diabetes Program Recommendations:    Spoke with patient regarding outpatient diabetes management. Patient is followed by PCP for DM and he reports they just increased insulin by 3 units.  Reviewed patient's current A1c of 14.1% up from 12.8%. Explained what a A1c is and what it measures. Also reviewed goal A1c with patient, importance of good glucose control @ home, and blood sugar goals. Reviewed patho of DM, need for improved control, basic survival skills, interventions, risks for infections, vascular changes and commorbidities.  Patient has a meter and wears freestyle libre. States that his glucose trends have been in the 400's mg/dL. Reviewed at length target goals and when to contact MD.  Admits to drinking sugary beverages. Reviewed alternatives and importance of CHO mindfulness. Dietitian consult placed. Has recently been to outpatient education.   Thanks, Lujean Rave, MSN, RNC-OB Diabetes Coordinator [phone] (8a-5p)

## 2024-02-14 NOTE — Progress Notes (Signed)
 Pharmacy Antibiotic Note  Ian Hughes is a 43 y.o. male admitted on 02/13/2024 with Right lower extremity wound/cellulitis.  Pharmacy has been consulted for cefepime/vancomycin dosing.  -WBC 14, sCr 1.44 (bl~0.9), afebrile -Blood cultures collected  Plan: -Cefepime 2g VI every 8 hours -Vancomycin 2g IV x1 -Vancomycin 750mg  IV every 12 hours (AUC 430, Vd 0.5, IBW) -Monitor renal function -Follow up signs of clinical improvement, LOT, de-escalation of antibiotics   Height: 5\' 10"  (177.8 cm) Weight: 112.9 kg (249 lb) IBW/kg (Calculated) : 73  Temp (24hrs), Avg:99.5 F (37.5 C), Min:99.5 F (37.5 C), Max:99.5 F (37.5 C)  Recent Labs  Lab 02/13/24 2313  WBC 14.5*  CREATININE 1.44*  LATICACIDVEN 0.9    Estimated Creatinine Clearance: 84.1 mL/min (A) (by C-G formula based on SCr of 1.44 mg/dL (H)).    No Known Allergies  Antimicrobials this admission: Cefepime 3/27 >>  Vancomycin 3/27 >>   Microbiology results: 3/27 BCx:   Thank you for allowing pharmacy to be a part of this patient's care.  Arabella Merles, PharmD. Clinical Pharmacist 02/14/2024 1:47 AM

## 2024-02-14 NOTE — ED Provider Notes (Signed)
 [place] EMERGENCY DEPARTMENT AT [facility] Provider Note   CSN: [id number] Arrival date & time: 02/13/24  2251     History  Chief Complaint  Patient presents with   Leg Injury    Ian Hughes is a 43 y.o. male.  Presents to the emergency department for pain and swelling of the right leg.  Patient reports that he scraped his leg on a wooden pallet several days ago causing a skin injury.  Since then he has noticed increased swelling of the leg, redness.       Home Medications Prior to Admission medications   Medication Sig Start Date End Date Taking? Authorizing Provider  amLODipine (NORVASC) 5 MG tablet Take 5 mg by mouth daily. 09/24/23   [provider]  atorvastatin (LIPITOR) 10 MG tablet Take 1 tablet (10 mg total) by mouth daily. 09/19/23   Rema Fendt, NP  Continuous Glucose Receiver (FREESTYLE LIBRE 2 READER) DEVI Use to check blood sugar continuously throughout the day. E11.42 03/16/23   Hoy Register, MD  Continuous Glucose Sensor (FREESTYLE LIBRE 2 SENSOR) MISC Use to check blood sugar continuously throughout the day. Change sensors once every 14 days. E11.42 03/16/23   Hoy Register, MD  gabapentin (NEURONTIN) 300 MG capsule Take 1 capsule (300 mg total) by mouth at bedtime. 01/08/24   Rema Fendt, NP  hydrochlorothiazide (HYDRODIURIL) 12.5 MG tablet Take 1 tablet (12.5 mg total) by mouth daily. 01/22/24 04/21/24  Rema Fendt, NP  Insulin Glargine [facility] KWIKPEN) 100 UNIT/ML Inject 13 Units into the skin at bedtime. 01/22/24   Rema Fendt, NP  Insulin Pen Needle 32G X 4 MM MISC 1 each by Other route daily. Use to inject insulin once daily. 01/10/24   Rema Fendt, NP  losartan (COZAAR) 50 MG tablet Take 1 tablet (50 mg total) by mouth daily. 01/22/24 04/21/24  Rema Fendt, NP  metFORMIN (GLUCOPHAGE-XR) 500 MG 24 hr tablet Take 2 tablets (1,000 mg total) by mouth 2 (two) times daily with a meal. 01/22/24 04/21/24  Rema Fendt, NP   methocarbamol (ROBAXIN) 500 MG tablet Take 1 tablet (500 mg total) by mouth 2 (two) times daily as needed for muscle spasms. Patient not taking: Reported on 01/22/2024 10/24/23   Gustavus Bryant, FNP  TRUEplus Lancets 28G MISC Use to check blood sugar 2 times a day 03/16/23   Rema Fendt, NP  bromocriptine (PARLODEL) 2.5 MG tablet 1/4 tab daily Patient not taking: Reported on 03/12/2019 01/11/18 10/06/19  Romero Belling, MD  glimepiride (AMARYL) 1 MG tablet Take 0.5 tablets (0.5 mg total) by mouth daily with breakfast. Patient not taking: Reported on 03/12/2019 01/11/18 10/06/19  Romero Belling, MD      Allergies    Patient has no known allergies.    Review of Systems   Review of Systems  Physical Exam Updated Vital Signs BP 129/72   Pulse 80   Temp 99.5 F (37.5 C) (Oral)   Resp 16   Ht 5\' 10"  (1.778 m)   Wt 112.9 kg   SpO2 100%   BMI 35.73 kg/m  Physical Exam Vitals and nursing note reviewed.  Constitutional:      General: He is not in acute distress.    Appearance: He is well-developed.  HENT:     Head: Normocephalic and atraumatic.     Mouth/Throat:     Mouth: Mucous membranes are moist.  Eyes:     General: Vision grossly intact. Gaze  aligned appropriately.     Extraocular Movements: Extraocular movements intact.     Conjunctiva/sclera: Conjunctivae normal.  Cardiovascular:     Rate and Rhythm: Normal rate and regular rhythm.     Pulses: Normal pulses.     Heart sounds: Normal heart sounds, S1 normal and S2 normal. No murmur heard.    No friction rub. No gallop.  Pulmonary:     Effort: Pulmonary effort is normal. No respiratory distress.     Breath sounds: Normal breath sounds.  Abdominal:     Palpations: Abdomen is soft.     Tenderness: There is no abdominal tenderness. There is no guarding or rebound.     Hernia: No hernia is present.  Musculoskeletal:        General: No swelling.     Cervical back: Full passive range of motion without pain, normal range of  motion and neck supple. No pain with movement, spinous process tenderness or muscular tenderness. Normal range of motion.     Right lower leg: Tenderness present. Edema present.     Left lower leg: No edema.  Skin:    General: Skin is warm and dry.     Capillary Refill: Capillary refill takes less than 2 seconds.     Findings: Wound (Right lower leg, lateral aspect with large eschar and surrounding erythema, induration.  Entire lower leg is swollen.) present. No ecchymosis, erythema or lesion.  Neurological:     Mental Status: He is alert and oriented to person, place, and time.     GCS: GCS eye subscore is 4. GCS verbal subscore is 5. GCS motor subscore is 6.     Cranial Nerves: Cranial nerves 2-12 are intact.     Sensory: Sensation is intact.     Motor: Motor function is intact. No weakness or abnormal muscle tone.     Coordination: Coordination is intact.  Psychiatric:        Mood and Affect: Mood normal.        Speech: Speech normal.        Behavior: Behavior normal.     ED Results / Procedures / Treatments   Labs (all labs ordered are listed, but only abnormal results are displayed) Labs Reviewed  CBC WITH DIFFERENTIAL/PLATELET - Abnormal; Notable for the following components:      Result Value   WBC 14.5 (*)    Hemoglobin 12.6 (*)    HCT 36.6 (*)    MCV 79.7 (*)    Neutro Abs 11.4 (*)    Monocytes Absolute 1.3 (*)    Abs Immature Granulocytes 0.10 (*)    All other components within normal limits  BASIC METABOLIC PANEL - Abnormal; Notable for the following components:   Sodium 129 (*)    Chloride 94 (*)    Glucose, Bld 355 (*)    Creatinine, Ser 1.44 (*)    All other components within normal limits  CULTURE, BLOOD (ROUTINE X 2)  CULTURE, BLOOD (ROUTINE X 2)  LACTIC ACID, PLASMA  HIV ANTIBODY (ROUTINE TESTING W REFLEX)    EKG None  Radiology DG Tibia/Fibula Right Result Date: 02/13/2024 CLINICAL DATA:  Injuries, skin infection. EXAM: RIGHT TIBIA AND FIBULA - 2  VIEW COMPARISON:  None Available. FINDINGS: There is no evidence of fracture or other focal bone lesions. There is diffuse soft tissue swelling of the lower extremity. No definite radiopaque foreign body identified. IMPRESSION: Diffuse soft tissue swelling of the lower extremity. No definite radiopaque foreign body identified. Electronically Signed  By: Darliss Cheney M.D.   On: 02/13/2024 23:32    Procedures Procedures    Medications Ordered in ED Medications - No data to display  ED Course/ Medical Decision Making/ A&P                                 Medical Decision Making Amount and/or Complexity of Data Reviewed Labs: ordered. Radiology: ordered.  Risk Decision regarding hospitalization.   Resents with wound to the right leg.  Patient is a poorly controlled diabetic, reports that his hemoglobin A1c is around 14.  Blood sugar today is over 400.  No signs of DKA.  X-ray does not show any bony injury, no soft tissue gas.  Patient with a leukocytosis but no signs of sepsis.  I do not feel the patient would improve with oral antibiotics as an outpatient based on his uncontrolled diabetes status.  Have recommended inpatient hospitalization for further antibiotic treatment.  Antibiotics chosen would not conjunction with hospitalist service and pharmacy.        Final Clinical Impression(s) / ED Diagnoses Final diagnoses:  Infected abrasion of right lower extremity, initial encounter  Cellulitis of right lower extremity    Rx / DC Orders ED Discharge Orders     None         Gilda Crease, MD 02/14/24 [phone]

## 2024-02-14 NOTE — Plan of Care (Signed)

## 2024-02-14 NOTE — ED Notes (Signed)
 Called Monisha at [facility] for transport 11:03

## 2024-02-15 ENCOUNTER — Inpatient Hospital Stay (HOSPITAL_COMMUNITY)

## 2024-02-15 DIAGNOSIS — L03115 Cellulitis of right lower limb: Secondary | ICD-10-CM | POA: Diagnosis not present

## 2024-02-15 LAB — CBC
HCT: 34.9 % — ABNORMAL LOW (ref 39.0–52.0)
Hemoglobin: 11.8 g/dL — ABNORMAL LOW (ref 13.0–17.0)
MCH: 27.7 pg (ref 26.0–34.0)
MCHC: 33.8 g/dL (ref 30.0–36.0)
MCV: 81.9 fL (ref 80.0–100.0)
Platelets: 376 10*3/uL (ref 150–400)
RBC: 4.26 MIL/uL (ref 4.22–5.81)
RDW: 13.1 % (ref 11.5–15.5)
WBC: 16 10*3/uL — ABNORMAL HIGH (ref 4.0–10.5)
nRBC: 0 % (ref 0.0–0.2)

## 2024-02-15 LAB — BASIC METABOLIC PANEL WITH GFR
Anion gap: 10 (ref 5–15)
BUN: 11 mg/dL (ref 6–20)
CO2: 23 mmol/L (ref 22–32)
Calcium: 8.4 mg/dL — ABNORMAL LOW (ref 8.9–10.3)
Chloride: 102 mmol/L (ref 98–111)
Creatinine, Ser: 0.78 mg/dL (ref 0.61–1.24)
GFR, Estimated: >60 mL/min (ref >60)
Glucose, Bld: 207 mg/dL — ABNORMAL HIGH (ref 70–99)
Potassium: 3.5 mmol/L (ref 3.5–5.1)
Sodium: 135 mmol/L (ref 135–145)

## 2024-02-15 LAB — GLUCOSE, CAPILLARY
Glucose-Capillary: 184 mg/dL — ABNORMAL HIGH (ref 70–99)
Glucose-Capillary: 199 mg/dL — ABNORMAL HIGH (ref 70–99)
Glucose-Capillary: 252 mg/dL — ABNORMAL HIGH (ref 70–99)
Glucose-Capillary: 173 mg/dL — ABNORMAL HIGH (ref 70–99)
Glucose-Capillary: 213 mg/dL — ABNORMAL HIGH (ref 70–99)

## 2024-02-15 MED ORDER — ENSURE MAX PROTEIN PO LIQD
11.0000 [oz_av] | Freq: Every day | ORAL | Status: DC
  Administered 2024-02-15 – 2024-02-23 (×6): 11 [oz_av] via ORAL
  Filled 2024-02-15 (×3): qty 330

## 2024-02-15 MED ORDER — TETANUS-DIPHTH-ACELL PERTUSSIS 5-2.5-18.5 LF-MCG/0.5 IM SUSY
0.5000 mL | PREFILLED_SYRINGE | Freq: Once | INTRAMUSCULAR | Status: AC
  Administered 2024-02-15: 0.5 mL via INTRAMUSCULAR
  Filled 2024-02-15: qty 0.5

## 2024-02-15 MED ORDER — IOHEXOL 300 MG/ML  SOLN
100.0000 mL | Freq: Once | INTRAMUSCULAR | Status: AC | PRN
  Administered 2024-02-15: 100 mL via INTRAVENOUS

## 2024-02-15 MED ORDER — VANCOMYCIN HCL 1500 MG/300ML IV SOLN
1500.0000 mg | Freq: Two times a day (BID) | INTRAVENOUS | Status: DC
  Administered 2024-02-15 – 2024-02-18 (×6): 1500 mg via INTRAVENOUS
  Filled 2024-02-15 (×7): qty 300

## 2024-02-15 MED ORDER — ZINC SULFATE 220 (50 ZN) MG PO CAPS
220.0000 mg | ORAL_CAPSULE | Freq: Every day | ORAL | Status: DC
Start: 2024-02-15 — End: 2024-02-23
  Administered 2024-02-15 – 2024-02-23 (×7): 220 mg via ORAL
  Filled 2024-02-15 (×7): qty 1

## 2024-02-15 MED ORDER — VITAMIN C 500 MG PO TABS
500.0000 mg | ORAL_TABLET | Freq: Two times a day (BID) | ORAL | Status: DC
  Administered 2024-02-15 – 2024-02-23 (×15): 500 mg via ORAL
  Filled 2024-02-15 (×15): qty 1

## 2024-02-15 MED ORDER — INSULIN GLARGINE-YFGN 100 UNIT/ML ~~LOC~~ SOLN
16.0000 [IU] | Freq: Every day | SUBCUTANEOUS | Status: DC
  Administered 2024-02-15: 16 [IU] via SUBCUTANEOUS
  Filled 2024-02-15: qty 0.16

## 2024-02-15 MED ORDER — GABAPENTIN 300 MG PO CAPS
300.0000 mg | ORAL_CAPSULE | Freq: Every day | ORAL | Status: DC
  Administered 2024-02-15 – 2024-02-22 (×8): 300 mg via ORAL
  Filled 2024-02-15 (×8): qty 1

## 2024-02-15 MED ORDER — MEDIHONEY WOUND/BURN DRESSING EX PSTE
1.0000 | PASTE | Freq: Every day | CUTANEOUS | Status: DC
  Administered 2024-02-15 – 2024-02-23 (×6): 1 via TOPICAL
  Filled 2024-02-15 (×2): qty 44

## 2024-02-15 NOTE — Progress Notes (Addendum)
 PROGRESS NOTE    Ian Hughes  [name]:[id number] DOB: 1981/02/12 DOA: 02/13/2024 PCP: Rema Fendt, NP    Brief Narrative:   Ian Hughes is a 43 y.o. male with past medical history significant for HTN, HLD, DM2, obesity who presented to [facility] ED on 3/26 with wound to right lower extremity.  Patient reports scraping his leg on a pallet at work roughly 3 days prior to ED presentation.  He tried to clean the wound up as best he could at home but due to increased swelling, drainage and worsening he sought further care in the ED.  Patient denies fever, no chills, no nausea/vomiting.  In the ED, temperature 100.7 F, HR 86, RR 16, BP 129/73, SpO2 99% on room air.  WBC 14.5, hemoglobin 12.6, platelet count 388.  Sodium 129, potassium 3.8, chloride 94, CO2 28, glucose 355, BUN 19, creatinine 1.44.  Blood cultures x 2: Obtained.  X-ray right tibia/fibula with diffuse soft tissue swelling of the lower extremity, no definite radiopaque foreign body identified.  Patient was started on IV vancomycin and cefepime.  TRH consulted for admission.  Assessment & Plan:   Right lower extremity wound/cellulitis Left lower extremity wound Patient presenting to ED with 3-day history of progressive purulent wound to right lower extremity following trauma after scraping against a wooden pallet at work.  Patient with temperature up to 100.7 in the ED; WBC count elevated 14.5.  X-ray right tibia/fibula with diffuse soft tissue swelling. -- WBC 14.5>14.3>16.0 -- CT right tibia/fibula: Pending to evaluate for possible underlying deep infection/abscess -- Vancomycin, pharmacy consulted for dosing/monitoring -- Ceftriaxone 1 g IV every 24 hours -- If deep infection/abscess identified on CT imaging, will need surgical evaluation for consideration of debridement -- CBC daily  Wound care: Cleanse wounds to R lower leg with Vashe wound cleanser, do not rinse and allow to air dry. Apply Medihoney to wound  beds daily, cover with dry gauze then ABD pads. Place Xeroform gauze L anterior lower leg daily, cover with ABD pad. Secure dressings to B lower legs with Kerlix roll gauze wrapped beginning just above toes and ending right below knees. May apply Ace bandage wrapped in same fashion as Kerlix roll gauze for light compression.   Acute renal failure: Resolved Patient presenting with an elevated creatinine of 1.44.  Given IV fluid bolus followed by continuous infusion with resolution and improvement of creatinine to 0.78. -- BMP daily  Hyponatremia Etiology likely hypovolemic hyponatremia in the setting of poor oral intake in the days preceding hospitalization combined with pseudohyponatremia with elevated glucose.  Supported with IV fluid hydration with improvement of sodium level. -- Na 129>135 -- BMP daily  Type 2 diabetes mellitus, with hyperglycemia Hemoglobin A1c 12.8 on 01/08/2024; poorly controlled.  Home regimen includes insulin glargine 13 units subcutaneously daily, metformin 1000 mg p.o. twice daily. -- Diabetic educator following, appreciate assistance -- Semglee 16u Porter daily -- Moderate SSI for coverage -- CBG before every meal/at bedtime  Essential hypertension -- Amlodipine 5 mg p.o. daily -- Losartan 50 mg p.o. nightly -- Hydrochlorothiazide 12.5 mg p.o. daily  Hyperlipidemia -- Atorvastatin 10 mg p.o. daily  Obesity, class II Body mass index is 35.73 kg/m.   DVT prophylaxis: enoxaparin (LOVENOX) injection 40 mg Start: 02/14/24 1800    Code Status: Full Code Family Communication: No family present at bedside this morning  Disposition Plan:  Level of care: Telemetry Status is: Inpatient Remains inpatient appropriate because: IV antibiotics  Consultants:  None  Procedures:  None  Antimicrobials:  Vancomycin 3/26>> Ceftriaxone 3/27>> Cefepime 3/26 - 3/27   Subjective: Patient seen examined bedside, lying in bed.  No specific complaints this morning.   Discussed with patient regarding wound and concern for deeper infection versus abscess given fluctuance and drainage.  Will obtain CT right lower extremity with contrast for further evaluation.  Remains on antibiotics with vancomycin/ceftriaxone.  Also discussed needs more aggressive management of his diabetes is likely a significant contributing factor to his infection.  Objective: Vitals:   02/14/24 1645 02/14/24 2001 02/14/24 2329 02/15/24 0404  BP: (!) 158/72 (!) 155/75 (!) 173/87 (!) 158/90  Pulse: (!) 103 98 91 91  Resp:  20 18 18   Temp: 98.9 F (37.2 C) (!) 100.7 F (38.2 C) 100 F (37.8 C) 99.4 F (37.4 C)  TempSrc: Oral Oral Oral Oral  SpO2:  98% 98% 99%  Weight:      Height:        Intake/Output Summary (Last 24 hours) at 02/15/2024 0958 Last data filed at 02/15/2024 0750 Gross per 24 hour  Intake --  Output 950 ml  Net -950 ml   Filed Weights   02/13/24 2302  Weight: 112.9 kg    Examination:  Physical Exam: GEN: NAD, alert and oriented x 3, obese HEENT: NCAT, PERRL, EOMI, sclera clear, MMM PULM: CTAB w/o wheezes/crackles, normal respiratory effort, on room air CV: RRR w/o M/G/R GI: abd soft, NTND, NABS, no R/G/M MSK: + peripheral edema, muscle strength globally intact 5/5 bilateral upper/lower extremities NEURO: CN II-XII intact, no focal deficits, sensation to light touch intact PSYCH: normal mood/affect Integumentary: Bilateral lower extremity wounds, cellulitis as depicted below            Data Reviewed: I have personally reviewed following labs and imaging studies  CBC: Recent Labs  Lab 02/13/24 2313 02/14/24 0418 02/15/24 0454  WBC 14.5* 14.3* 16.0*  NEUTROABS 11.4*  --   --   HGB 12.6* 12.4* 11.8*  HCT 36.6* 36.4* 34.9*  MCV 79.7* 79.8* 81.9  PLT 388 381 376   Basic Metabolic Panel: Recent Labs  Lab 02/13/24 2313 02/14/24 0418 02/15/24 0454  NA 129* 132* 135  K 3.8 3.3* 3.5  CL 94* 98 102  CO2 28 27 23   GLUCOSE 355* 290*  207*  BUN 19 19 11   CREATININE 1.44* 1.11 0.78  CALCIUM 8.9 8.8* 8.4*   GFR: Estimated Creatinine Clearance: 151.4 mL/min (by C-G formula based on SCr of 0.78 mg/dL). Liver Function Tests: Recent Labs  Lab 02/14/24 0418  AST 10*  ALT 19  ALKPHOS 91  BILITOT 0.4  PROT 6.3*  ALBUMIN 3.3*   No results for input(s): "LIPASE", "AMYLASE" in the last 168 hours. No results for input(s): "AMMONIA" in the last 168 hours. Coagulation Profile: No results for input(s): "INR", "PROTIME" in the last 168 hours. Cardiac Enzymes: No results for input(s): "CKTOTAL", "CKMB", "CKMBINDEX", "TROPONINI" in the last 168 hours. BNP (last 3 results) No results for input(s): "PROBNP" in the last 8760 hours. HbA1C: No results for input(s): "HGBA1C" in the last 72 hours. CBG: Recent Labs  Lab 02/14/24 1720 02/14/24 2058 02/14/24 2332 02/15/24 0405 02/15/24 0715  GLUCAP 214* 212* 261* 184* 199*   Lipid Profile: No results for input(s): "CHOL", "HDL", "LDLCALC", "TRIG", "CHOLHDL", "LDLDIRECT" in the last 72 hours. Thyroid Function Tests: No results for input(s): "TSH", "T4TOTAL", "FREET4", "T3FREE", "THYROIDAB" in the last 72 hours. Anemia Panel: No results for input(s): "VITAMINB12", "  FOLATE", "FERRITIN", "TIBC", "IRON", "RETICCTPCT" in the last 72 hours. Sepsis Labs: Recent Labs  Lab 02/13/24 2313  LATICACIDVEN 0.9    Recent Results (from the past 240 hours)  Culture, blood (Routine X 2) w Reflex to ID Panel     Status: None (Preliminary result)   Collection Time: 02/14/24  1:00 AM   Specimen: BLOOD LEFT ARM  Result Value Ref Range Status   Specimen Description   Final    BLOOD LEFT ARM Performed at [facility], [address], [place], [place] [address]    Special Requests   Final    BOTTLES DRAWN AEROBIC AND ANAEROBIC Blood Culture adequate volume Performed at [facility], [address], [place], [place] [address]    Culture   Final     NO GROWTH < 24 HOURS Performed at [facility], [address], [place], [place] [address]    Report Status PENDING  Incomplete  Culture, blood (Routine X 2) w Reflex to ID Panel     Status: None (Preliminary result)   Collection Time: 02/14/24  1:15 AM   Specimen: BLOOD RIGHT ARM  Result Value Ref Range Status   Specimen Description   Final    BLOOD RIGHT ARM Performed at [facility], [address] [place], [place] [address]    Special Requests   Final    BOTTLES DRAWN AEROBIC AND ANAEROBIC Blood Culture adequate volume Performed at [facility], [address], [place], [place] [address]    Culture   Final    NO GROWTH < 24 HOURS Performed at [facility], [address], [place], [place] [address]    Report Status PENDING  Incomplete         Radiology Studies: DG Tibia/Fibula Right Result Date: 02/13/2024 CLINICAL DATA:  Injuries, skin infection. EXAM: RIGHT TIBIA AND FIBULA - 2 VIEW COMPARISON:  None Available. FINDINGS: There is no evidence of fracture or other focal bone lesions. There is diffuse soft tissue swelling of the lower extremity. No definite radiopaque foreign body identified. IMPRESSION: Diffuse soft tissue swelling of the lower extremity. No definite radiopaque foreign body identified. Electronically Signed   By: Darliss Cheney M.D.   On: 02/13/2024 23:32        Scheduled Meds:  amLODipine  5 mg Oral Daily   atorvastatin  10 mg Oral QHS   enoxaparin (LOVENOX) injection  40 mg Subcutaneous Q24H   hydrochlorothiazide  12.5 mg Oral Daily   insulin aspart  0-15 Units Subcutaneous TID WC   insulin aspart  0-5 Units Subcutaneous QHS   insulin glargine-yfgn  16 Units Subcutaneous QHS   leptospermum manuka honey  1 Application Topical Daily   losartan  50 mg Oral QHS   Continuous Infusions:  cefTRIAXone (ROCEPHIN)  IV Stopped (02/14/24 1840)   vancomycin 1,000 mg (02/15/24 0438)     LOS: 1 day     Time spent: 56 minutes spent on 02/15/2024 caring for this patient face-to-face including chart review, ordering labs/tests, documenting, discussion with nursing staff, consultants, updating family and interview/physical exam    Alvira Philips [place], DO [facility] Available via Epic secure chat 7am-7pm After these hours, please refer to coverage provider listed on [email or web address] 02/15/2024, 9:58 AM

## 2024-02-15 NOTE — Consult Note (Signed)
 WOC Nurse Consult Note: patient with reports of trauma to R lower leg in poorly controlled diabetic, admitted for cellulitis  Reason for Consult: leg wound  Wound type: full thickness R leg infectious r/t trauma  Pressure Injury POA: NA  Measurement: see nursing flowsheet  Wound bed: 1.  2 separate wounds R leg; anterior lower leg 75% yellow dry 25% black eschar R lateral lower leg 75% black eschar 25% yellow  2. Left anterior lower leg noted to have old healing wounds (?) with pink dry areas and some dark scabbed areas (no open draining wound noted)  Drainage (amount, consistency, odor) purulent drainage noted from R lateral leg wound  Periwound: edema, erythema to R leg  Dressing procedure/placement/frequency: Cleanse wounds to R lower leg with Vashe wound cleanser Hart [place] [phone]), do not rinse and allow to air dry. Apply Medihoney to wound beds daily, cover with dry gauze then ABD pads. Place Xeroform gauze Hart [place] [phone]) to L anterior lower leg daily, cover with ABD pad.  Secure dressings to B lower legs with Kerlix roll gauze wrapped beginning just above toes and ending right below knees.  May apply Ace bandage wrapped in same fashion as Kerlix roll gauze for light compression.   POC discussed with bedside nurse. WOC team will not follow. Re-consult if further needs arise.   Thank you,    Priscella Mann MSN, RN-BC, [employer] [phone]

## 2024-02-15 NOTE — Progress Notes (Signed)
 Initial Nutrition Assessment  DOCUMENTATION CODES:   Obesity unspecified  INTERVENTION:   -Ensure MAX Protein po daily, each supplement provides 150 kcal and 30 grams of protein   -Provided "Carbohydrate Counting" handout in AVS -Reviewed diet with patient  To aid wound healing: -500 mg Vitamin C BID -220 mg Zinc sulfate daily x 14 days  NUTRITION DIAGNOSIS:   Increased nutrient needs related to wound healing as evidenced by estimated needs.  GOAL:   Patient will meet greater than or equal to 90% of their needs  MONITOR:   PO intake, Supplement acceptance  REASON FOR ASSESSMENT:   Consult Diet education  ASSESSMENT:   43 y.o. male with past medical history significant for HTN, HLD, DM2, obesity who presented to [facility] ED on 3/26 with wound to right lower extremity.  Patient reports scraping his leg on a pallet at work roughly 3 days prior to ED presentation.  Patient in room, states his leg is in pain but he is trying to walk around a bit in room. Pt reports eating well here. Has had poor appetite for several days recently.  Reports limiting red meats, and primarily eating fish and chicken. Drinks water but does also drink sweetened green tea drinks. Encouraged him to switch to diet versions of those and we discussed other non-sweetened beverage options. Pt has had diet education in the outpatient setting last year.  Pt open to drinking protein shake while here and vitamin supplementation to aid in wound healing.   Medications reviewed.  Labs reviewed: CBGs: 184-261   NUTRITION - FOCUSED PHYSICAL EXAM:  No depletions noted.  Diet Order:   Diet Order             Diet heart healthy/carb modified Room service appropriate? Yes; Fluid consistency: Thin  Diet effective now                   EDUCATION NEEDS:   Education needs have been addressed  Skin:  Skin Assessment: Skin Integrity Issues: Skin Integrity Issues:: Other (Comment) Other:  right pretibial wounds  Last BM:  3/28  Height:   Ht Readings from Last 1 Encounters:  02/13/24 5\' 10"  (1.778 m)    Weight:   Wt Readings from Last 1 Encounters:  02/13/24 112.9 kg    BMI:  Body mass index is 35.73 kg/m.  Estimated Nutritional Needs:   Kcal:  1800-2000  Protein:  90-100g  Fluid:  2L/day   Tilda Franco, MS, RD, LDN Inpatient Clinical Dietitian Contact via Secure chat

## 2024-02-15 NOTE — Inpatient Diabetes Management (Addendum)
 Inpatient Diabetes Program Recommendations  AACE/ADA: New Consensus Statement on Inpatient Glycemic Control (2015)  Target Ranges:  Prepandial:   less than 140 mg/dL      Peak postprandial:   less than 180 mg/dL (1-2 hours)      Critically ill patients:  140 - 180 mg/dL   Lab Results  Component Value Date   GLUCAP 199 (H) 02/15/2024   HGBA1C 14.1 (A) 01/22/2024    Review of Glycemic Control  Latest Reference Range & Units 02/14/24 17:20 02/14/24 20:58 02/14/24 23:32 02/15/24 04:05 02/15/24 07:15  Glucose-Capillary 70 - 99 mg/dL 409 (H) 811 (H) 914 (H) 184 (H) 199 (H)  (H): Data is abnormally high Diabetes history: Type 2 DM Outpatient Diabetes medications: Basaglar 13 units at bedtime, Metformin 1000 mg BID Current orders for Inpatient glycemic control: Semglee 13 units at bedtime, Novolog 0-15 units TID & HS   Inpatient Diabetes Program Recommendations:  Consider increasing Semglee 18 units at bedtime.  Addendum: Spoke with patient briefly to further reinforce discussion regarding diabetes. Freestyle libre sensor provided. Patient has no additional questions at this time.   Thanks, Lujean Rave, MSN, RNC-OB Diabetes Coordinator [phone] (8a-5p)

## 2024-02-15 NOTE — Progress Notes (Signed)
 Pharmacy Antibiotic Note  Ian Hughes is a 43 y.o. male admitted on 02/13/2024 with right lower extremity wound/cellulitis. Pharmacy remains consulted for vancomycin dosing.   Today, SCr improved to 0.78 and est crcl >100 ml/min.  Plan: Continue Rocephin 1 g IV q24h per provider Change vancomycin to 1500 mg IV Q12H (Goal AUC 400-550, eAUC 494.4, SCr used: rounded up to 0.8) Monitor clinical progress, renal function, vancomycin levels as indicated F/U C&S, abx deescalation / LOT   Height: 5\' 10"  (177.8 cm) Weight: 112.9 kg (249 lb) IBW/kg (Calculated) : 73  Temp (24hrs), Avg:99.4 F (37.4 C), Min:98 F (36.7 C), Max:100.7 F (38.2 C)  Recent Labs  Lab 02/13/24 2313 02/14/24 0418 02/15/24 0454  WBC 14.5* 14.3* 16.0*  CREATININE 1.44* 1.11 0.78  LATICACIDVEN 0.9  --   --     Estimated Creatinine Clearance: 151.4 mL/min (by C-G formula based on SCr of 0.78 mg/dL).    No Known Allergies  Antimicrobials this admission: Cefepime 3/27 >> 3/27 Vancomycin 3/27 >>  Rocephin 3/27 >>   Microbiology results: 3/27 BCx: ng<24hr    Thank you for allowing pharmacy to be a part of this patient's care.  Selinda Eon, PharmD, BCPS Clinical Pharmacist [place] 02/15/2024 11:53 AM

## 2024-02-15 NOTE — Progress Notes (Signed)
   02/15/24 1056  TOC Brief Assessment  Insurance and Status Reviewed  Patient has primary care physician Yes  Home environment has been reviewed Home w/ spouse  Prior level of function: Independent  Prior/Current Home Services No current home services  Social Drivers of Health Review SDOH reviewed no interventions necessary  Readmission risk has been reviewed Yes  Transition of care needs no transition of care needs at this time

## 2024-02-15 NOTE — Discharge Instructions (Signed)

## 2024-02-16 ENCOUNTER — Inpatient Hospital Stay (HOSPITAL_COMMUNITY)

## 2024-02-16 DIAGNOSIS — L03115 Cellulitis of right lower limb: Secondary | ICD-10-CM | POA: Diagnosis not present

## 2024-02-16 LAB — GLUCOSE, CAPILLARY
Glucose-Capillary: 219 mg/dL — ABNORMAL HIGH (ref 70–99)
Glucose-Capillary: 238 mg/dL — ABNORMAL HIGH (ref 70–99)
Glucose-Capillary: 175 mg/dL — ABNORMAL HIGH (ref 70–99)
Glucose-Capillary: 157 mg/dL — ABNORMAL HIGH (ref 70–99)

## 2024-02-16 LAB — MAGNESIUM: Magnesium: 2 mg/dL (ref 1.7–2.4)

## 2024-02-16 LAB — CBC
HCT: 34.5 % — ABNORMAL LOW (ref 39.0–52.0)
Hemoglobin: 11.4 g/dL — ABNORMAL LOW (ref 13.0–17.0)
MCH: 27.7 pg (ref 26.0–34.0)
MCHC: 33 g/dL (ref 30.0–36.0)
MCV: 83.7 fL (ref 80.0–100.0)
Platelets: 365 10*3/uL (ref 150–400)
RBC: 4.12 MIL/uL — ABNORMAL LOW (ref 4.22–5.81)
RDW: 13.3 % (ref 11.5–15.5)
WBC: 15.4 10*3/uL — ABNORMAL HIGH (ref 4.0–10.5)
nRBC: 0 % (ref 0.0–0.2)

## 2024-02-16 LAB — BASIC METABOLIC PANEL WITH GFR
Anion gap: 12 (ref 5–15)
BUN: 11 mg/dL (ref 6–20)
CO2: 22 mmol/L (ref 22–32)
Calcium: 8.6 mg/dL — ABNORMAL LOW (ref 8.9–10.3)
Chloride: 101 mmol/L (ref 98–111)
Creatinine, Ser: 0.87 mg/dL (ref 0.61–1.24)
GFR, Estimated: >60 mL/min (ref >60)
Glucose, Bld: 189 mg/dL — ABNORMAL HIGH (ref 70–99)
Potassium: 3.5 mmol/L (ref 3.5–5.1)
Sodium: 135 mmol/L (ref 135–145)

## 2024-02-16 MED ORDER — LIDOCAINE HCL 1 % IJ SOLN
INTRAMUSCULAR | Status: AC
  Filled 2024-02-16: qty 20

## 2024-02-16 MED ORDER — SODIUM CHLORIDE 0.9 % IV SOLN
2.0000 g | Freq: Three times a day (TID) | INTRAVENOUS | Status: AC
Start: 2024-02-16 — End: 2024-02-18
  Administered 2024-02-16 – 2024-02-18 (×7): 2 g via INTRAVENOUS
  Filled 2024-02-16 (×7): qty 12.5

## 2024-02-16 MED ORDER — INSULIN GLARGINE-YFGN 100 UNIT/ML ~~LOC~~ SOLN
20.0000 [IU] | Freq: Every day | SUBCUTANEOUS | Status: DC
  Administered 2024-02-16: 20 [IU] via SUBCUTANEOUS
  Filled 2024-02-16 (×2): qty 0.2

## 2024-02-16 NOTE — Consult Note (Signed)
 Patient ID: Ian Hughes MRN: [id number] DOB/AGE: [birth year] 43 y.o.  Admit date: 02/13/2024  Admission Diagnoses:  Principal Problem:   Cellulitis of right lower leg Active Problems:   Insulin dependent type 2 diabetes mellitus (HCC)   AKI (acute kidney injury) (HCC)   HPI: Ortho consult for right soleus abscess on CT.  Mar 22nd - abrasion at work over this area Mar 24th - noticed swelling & pain Mar 26th - presented to ER Mar 28th - febrile overnight  No prior calf pain or swelling in his entire life per patient. No tumors/cancer history.  PMH notable for HTN, HLD, DM2, obesity.  Denies numbness/tingling.  Past Medical History: Past Medical History:  Diagnosis Date   Cataract 2011   surgery on R eye   Diabetes mellitus without complication (HCC)    High cholesterol    Obesity     Surgical History: Past Surgical History:  Procedure Laterality Date   EYE SURGERY Left 2011   cataract   EYE SURGERY Right 2012    Family History: Family History  Problem Relation Age of Onset   Diabetes Mother    Cancer Father        leukemia    Obesity Other     Social History: Social History   Socioeconomic History   Marital status: Divorced    Spouse name: Not on file   Number of children: 1   Years of education: 12+   Highest education level: 12th grade  Occupational History    Employer: [employer]  Tobacco Use   Smoking status: Never    Passive exposure: Never   Smokeless tobacco: Never  Vaping Use   Vaping status: Never Used  Substance and Sexual Activity   Alcohol use: Yes    Comment: 1 or 2 every 3 months    Drug use: No   Sexual activity: Not on file  Other Topics Concern   Not on file  Social History Narrative   Lives at home with wife and stepson.   Caffeine use: none   Has one child.   Social Drivers of Corporate investment banker Strain: Low Risk  (03/12/2023)   Overall Financial Resource Strain (CARDIA)    Difficulty of Paying Living  Expenses: Not very hard  Food Insecurity: No Food Insecurity (03/12/2023)   Hunger Vital Sign    Worried About Running Out of Food in the Last Year: Never true    Ran Out of Food in the Last Year: Never true  Transportation Needs: No Transportation Needs (03/12/2023)   PRAPARE - Administrator, Civil Service (Medical): No    Lack of Transportation (Non-Medical): No  Physical Activity: Sufficiently Active (03/12/2023)   Exercise Vital Sign    Days of Exercise per Week: 7 days    Minutes of Exercise per Session: 30 min  Stress: No Stress Concern Present (03/12/2023)   [employer] of Occupational Health - Occupational Stress Questionnaire    Feeling of Stress : Only a little  Social Connections: Unknown (03/16/2023)   Social Connection and Isolation Panel [NHANES]    Frequency of Communication with Friends and Family: Twice a week    Frequency of Social Gatherings with Friends and Family: Once a week    Attends Religious Services: Never    Database administrator or Organizations: No    Attends Banker Meetings: Never    Marital Status: Patient declined  Catering manager Violence: Not on file  Allergies: Patient has no known allergies.  Medications: I have reviewed the patient's current medications.  Vital Signs: Patient Vitals for the past 24 hrs:  BP Temp Temp src Pulse Resp SpO2  02/16/24 0524 (!) 162/84 99.7 F (37.6 C) Oral 84 15 100 %  02/15/24 2108 -- 100.3 F (37.9 C) Oral -- -- --  02/15/24 2003 (!) 153/87 (!) 101.1 F (38.4 C) Oral 98 18 98 %  02/15/24 1202 (!) 176/96 98.8 F (37.1 C) Oral 89 16 100 %    Radiology: CT TIBIA FIBULA RIGHT W CONTRAST Result Date: 02/15/2024 CLINICAL DATA:  Soft tissue infection suspected, lower leg, xray done EXAM: CT OF THE LOWER RIGHT EXTREMITY WITH CONTRAST TECHNIQUE: Multidetector CT imaging of the lower right extremity was performed according to the standard protocol following intravenous contrast  administration. RADIATION DOSE REDUCTION: This exam was performed according to the departmental dose-optimization program which includes automated exposure control, adjustment of the mA and/or kV according to patient size and/or use of iterative reconstruction technique. CONTRAST:  OMNIPAQUE IOHEXOL 300 MG/ML  SOLN COMPARISON:  X-ray 02/13/2024 FINDINGS: Bones/Joint/Cartilage Right tibia and fibula are intact. No fracture or dislocation. Mature calcification along the distal tibiofibular syndesmosis compatible sequela of prior trauma. No erosion or aggressive periosteal elevation. No lytic or sclerotic bone lesion. Ligaments Suboptimally assessed by CT. Muscles and Tendons Low-attenuation changes within the peripheral aspect of the left soleus muscle underlying the site of soft tissue ulceration the level of the mid calf. This area demonstrates partial rim enhancement and measures approximately 5.3 x 2.8 x 3.6 cm (series 6, image 147). Appearance favors intramuscular phlegmon/developing abscess. No additional intramuscular collections are identified. No significant tendinous abnormality by CT. Soft tissues Superficial skin wound or ulceration the lateral aspect of the proximal to mid calf. Circumferential subcutaneous edema. No fluid collections within the subcutaneous tissues. IMPRESSION: 1. Superficial skin wound or ulceration the lateral aspect of the proximal to mid calf. Low-attenuation changes within the peripheral aspect of the left soleus muscle underlying the site of soft tissue ulceration measuring approximately 5.3 x 2.8 x 3.6 cm. Appearance favors intramuscular phlegmon/developing abscess. 2. No acute osseous abnormality. Electronically Signed   By: Duanne Guess D.O.   On: 02/15/2024 13:24   DG Tibia/Fibula Right Result Date: 02/13/2024 CLINICAL DATA:  Injuries, skin infection. EXAM: RIGHT TIBIA AND FIBULA - 2 VIEW COMPARISON:  None Available. FINDINGS: There is no evidence of fracture or  other focal bone lesions. There is diffuse soft tissue swelling of the lower extremity. No definite radiopaque foreign body identified. IMPRESSION: Diffuse soft tissue swelling of the lower extremity. No definite radiopaque foreign body identified. Electronically Signed   By: Darliss Cheney M.D.   On: 02/13/2024 23:32   VAS [place] LOWER EXTREMITY VENOUS (DVT) Result Date: 01/30/2024  Lower Venous DVT Study Patient Name:  Ian Hughes  Date of Exam:   01/30/2024 Medical Rec #: [id number]       Accession #:    [id number] Date of Birth: 25-Mar-1981        Patient Gender: M Patient Age:   70 years Exam Location:  [facility] Procedure:      VAS [place] LOWER EXTREMITY VENOUS (DVT) Referring Phys: AMY STEPHENS --------------------------------------------------------------------------------  Indications: Swelling, and Edema.  Comparison Study: No prior exam. Performing Technologist: Fernande Bras  Examination Guidelines: A complete evaluation includes B-mode imaging, spectral Doppler, color Doppler, and power Doppler as needed of all accessible portions of each vessel. Bilateral testing  is considered an integral part of a complete examination. Limited examinations for reoccurring indications may be performed as noted. The reflux portion of the exam is performed with the patient in reverse Trendelenburg.  +---------+---------------+---------+-----------+----------+--------------+ RIGHT    CompressibilityPhasicitySpontaneityPropertiesThrombus Aging +---------+---------------+---------+-----------+----------+--------------+ CFV      Full           Yes      Yes                                 +---------+---------------+---------+-----------+----------+--------------+ SFJ      Full           Yes      Yes                                 +---------+---------------+---------+-----------+----------+--------------+ FV Prox  Full                                                         +---------+---------------+---------+-----------+----------+--------------+ FV Mid   Full                                                        +---------+---------------+---------+-----------+----------+--------------+ FV DistalFull                                                        +---------+---------------+---------+-----------+----------+--------------+ PFV      Full                                                        +---------+---------------+---------+-----------+----------+--------------+ POP      Full           Yes      Yes                                 +---------+---------------+---------+-----------+----------+--------------+ PTV      Full                                                        +---------+---------------+---------+-----------+----------+--------------+ PERO     Full                                                        +---------+---------------+---------+-----------+----------+--------------+   +---------+---------------+---------+-----------+----------+--------------+ LEFT     CompressibilityPhasicitySpontaneityPropertiesThrombus Aging +---------+---------------+---------+-----------+----------+--------------+ CFV      Full  Yes      Yes                                 +---------+---------------+---------+-----------+----------+--------------+ SFJ      Full           Yes      Yes                                 +---------+---------------+---------+-----------+----------+--------------+ FV Prox  Full                                                        +---------+---------------+---------+-----------+----------+--------------+ FV Mid   Full                                                        +---------+---------------+---------+-----------+----------+--------------+ FV DistalFull                                                         +---------+---------------+---------+-----------+----------+--------------+ PFV      Full                                                        +---------+---------------+---------+-----------+----------+--------------+ POP      Full           Yes      Yes                                 +---------+---------------+---------+-----------+----------+--------------+ PTV      Full                                                        +---------+---------------+---------+-----------+----------+--------------+ PERO     Full                                                        +---------+---------------+---------+-----------+----------+--------------+     Summary: BILATERAL: - No evidence of deep vein thrombosis seen in the lower extremities, bilaterally. -No evidence of popliteal cyst, bilaterally.   *See table(s) above for measurements and observations. Electronically signed by Coral Else MD on 01/30/2024 at 8:42:32 PM.    Final     Labs: Recent Labs    02/15/24 0454 02/16/24 0737  WBC 16.0* 15.4*  RBC 4.26 4.12*  HCT 34.9* 34.5*  PLT 376  365   Recent Labs    02/15/24 0454 02/16/24 0737  NA 135 135  K 3.5 3.5  CL 102 101  CO2 23 22  BUN 11 11  CREATININE 0.78 0.87  GLUCOSE 207* 189*  CALCIUM 8.4* 8.6*   No results for input(s): "LABPT", "INR" in the last 72 hours.  Review of Systems: ROS as detailed in HPI  Physical Exam: Body mass index is 35.73 kg/m.  Physical Exam   Gen: AAOx3, NAD Comfortable at rest  Right Lower Extremity: Skin intact except for abrasion with purulent drainage TTP over proximal posterolateral calf ADF/APF/EHL 5/5 SILT throughout DP, PT 2+ to palp CR < 2s   Assessment and Plan: Ortho consult for right calf abscess, DOI 02/09/24  -history, exam and imaging reviewed at length with patient/family/hospitalist -planned IR guided drainage - please send samples for micro and path -PT/OT -will continue to monitor  Netta Cedars, MD Orthopaedic Surgeon [facility] [phone]  The risks and benefits were presented and reviewed. The risks due to new/persistent/recurrent infection, possible malignancy, stiffness, nerve/vessel/tendon injury, nonunion/malunion of any fracture, wound healing issues, allograft usage, development of arthritis, failure of this surgery, possibility of external fixation in certain situations, possibility of delayed definitive surgery, need for further surgery, prolonged wound care including further soft tissue coverage procedures, thromboembolic events, anesthesia/medical complications/events perioperatively and beyond, amputation, death among others were discussed. The patient acknowledged the explanation, agreed to proceed with the plan and a consent was signed.

## 2024-02-16 NOTE — Progress Notes (Signed)
 Pharmacy Antibiotic Note  Ian Hughes is a 43 y.o. male admitted on 02/13/2024 with right lower extremity wound/cellulitis. Pharmacy consulted for vancomycin dosing. On 3/29, consult to escalate ceftriaxone to cefepime for persistent fevers.  Plan: -Continue vancomycin 1500 mg IV q12h -Stop ceftriaxone and start cefepime 2 g IV q8h -Continue to follow renal function, cultures and clinical progress for dose adjustments and de-escalation as indicated   Height: 5\' 10"  (177.8 cm) Weight: 112.9 kg (249 lb) IBW/kg (Calculated) : 73  Temp (24hrs), Avg:100 F (37.8 C), Min:98.8 F (37.1 C), Max:101.1 F (38.4 C)  Recent Labs  Lab 02/13/24 2313 02/14/24 0418 02/15/24 0454  WBC 14.5* 14.3* 16.0*  CREATININE 1.44* 1.11 0.78  LATICACIDVEN 0.9  --   --     Estimated Creatinine Clearance: 151.4 mL/min (by C-G formula based on SCr of 0.78 mg/dL).    No Known Allergies  Antimicrobials this admission: Cefepime 3/29 >> Vancomycin 3/27 >>  Rocephin 3/27 >> 3/29   Microbiology results: 3/27 BCx: ngtd    Thank you for allowing pharmacy to be a part of this patient's care.  Pricilla Riffle, PharmD, BCPS Clinical Pharmacist 02/16/2024 8:53 AM

## 2024-02-16 NOTE — Plan of Care (Signed)
  Problem: Education: Goal: Ability to describe self-care measures that may prevent or decrease complications (Diabetes Survival Skills Education) will improve Outcome: Progressing Goal: Individualized Educational Video(s) Outcome: Progressing   Problem: Coping: Goal: Ability to adjust to condition or change in health will improve Outcome: Progressing   Problem: Fluid Volume: Goal: Ability to maintain a balanced intake and output will improve Outcome: Progressing   Problem: Health Behavior/Discharge Planning: Goal: Ability to identify and utilize available resources and services will improve Outcome: Progressing Goal: Ability to manage health-related needs will improve Outcome: Progressing   Problem: Metabolic: Goal: Ability to maintain appropriate glucose levels will improve Outcome: Progressing   Problem: Nutritional: Goal: Maintenance of adequate nutrition will improve Outcome: Progressing Goal: Progress toward achieving an optimal weight will improve Outcome: Progressing   Problem: Tissue Perfusion: Goal: Adequacy of tissue perfusion will improve Outcome: Progressing   Problem: Education: Goal: Knowledge of General Education information will improve Description: Including pain rating scale, medication(s)/side effects and non-pharmacologic comfort measures Outcome: Progressing   Problem: Health Behavior/Discharge Planning: Goal: Ability to manage health-related needs will improve Outcome: Progressing   Problem: Clinical Measurements: Goal: Ability to maintain clinical measurements within normal limits will improve Outcome: Progressing Goal: Will remain free from infection Outcome: Progressing Goal: Diagnostic test results will improve Outcome: Progressing Goal: Respiratory complications will improve Outcome: Progressing Goal: Cardiovascular complication will be avoided Outcome: Progressing   Problem: Activity: Goal: Risk for activity intolerance will  decrease Outcome: Progressing   Problem: Nutrition: Goal: Adequate nutrition will be maintained Outcome: Progressing   Problem: Coping: Goal: Level of anxiety will decrease Outcome: Progressing   Problem: Elimination: Goal: Will not experience complications related to bowel motility Outcome: Progressing Goal: Will not experience complications related to urinary retention Outcome: Progressing   Problem: Safety: Goal: Ability to remain free from injury will improve Outcome: Progressing   Problem: Skin Integrity: Goal: Risk for impaired skin integrity will decrease Outcome: Progressing   Problem: Skin Integrity: Goal: Risk for impaired skin integrity will decrease Outcome: Not Progressing   Problem: Pain Managment: Goal: General experience of comfort will improve and/or be controlled Outcome: Not Progressing

## 2024-02-16 NOTE — Consult Note (Signed)
 Chief Complaint: Patient was seen in consultation today for right soleus fluid collection  Referring Physician(s): [place], Minerva Areola, DO (hospitalist)/Ramanathan, Ayesha Rumpf, MD (ortho)  Supervising Physician: Richarda Overlie  Patient Status: Ian Hughes - In-pt  History of Present Illness: Ian Hughes is a 43 y.o. male with a past medical history significant for HTN, HLD, DM who presented to [facility] ED on 02/13/24 with complaints of a non-healing wound to his right calf. Per admission notes, patient scraped his leg on a pallet at work on or around 3/23 which he tried to clean as best he could however the wound continued to swell and developed significant drainage so he presented to the ED. On arrival temp 100.7, WBC 14.5, creatinine 1.44.   Right tib/fib x-ray was obtained on 3/26 and showed:  Diffuse soft tissue swelling of the lower extremity. No definite radiopaque foreign body identified  He was admitted for further evaluation and underwent CT right tib/fib w/contrast 3/28 which showed:  1. Superficial skin wound or ulceration the lateral aspect of the proximal to mid calf. Low-attenuation changes within the peripheral aspect of the left soleus muscle underlying the site of soft tissue ulceration measuring approximately 5.3 x 2.8 x 3.6 cm. Appearance favors intramuscular phlegmon/developing abscess. 2. No acute osseous abnormality.  Due to worsening leukocytosis and intermittent fevers despite antibiotic therapy orthopedic surgery was consulted who has recommended IR consultation for aspiration/possible drain placement.  Patient seen in his room this morning, wife at bedside. He denies complaints currently - is aware of the plan for drainage and is agreeable.   Past Medical History:  Diagnosis Date   Cataract 2011   surgery on R eye   Diabetes mellitus without complication (HCC)    High cholesterol    Obesity     Past Surgical History:  Procedure Laterality Date   EYE SURGERY Left 2011    cataract   EYE SURGERY Right 2012    Allergies: Patient has no known allergies.  Medications: Prior to Admission medications   Medication Sig Start Date End Date Taking? Authorizing Provider  acetaminophen (TYLENOL) 500 MG tablet Take 500 mg by mouth every 6 (six) hours as needed.   Yes [provider]  amLODipine (NORVASC) 5 MG tablet Take 5 mg by mouth daily. 09/24/23  Yes [provider]  atorvastatin (LIPITOR) 10 MG tablet Take 1 tablet (10 mg total) by mouth daily. 09/19/23  Yes Zonia Kief, Amy J, NP  Continuous Glucose Receiver (FREESTYLE LIBRE 2 READER) DEVI Use to check blood sugar continuously throughout the day. E11.42 03/16/23  Yes Hoy Register, MD  Continuous Glucose Sensor (FREESTYLE LIBRE 2 SENSOR) MISC Use to check blood sugar continuously throughout the day. Change sensors once every 14 days. E11.42 03/16/23  Yes Hoy Register, MD  gabapentin (NEURONTIN) 300 MG capsule Take 1 capsule (300 mg total) by mouth at bedtime. 01/08/24  Yes Zonia Kief, Amy J, NP  hydrochlorothiazide (HYDRODIURIL) 12.5 MG tablet Take 1 tablet (12.5 mg total) by mouth daily. 01/22/24 04/21/24 Yes Zonia Kief, Amy J, NP  Insulin Glargine (BASAGLAR KWIKPEN) 100 UNIT/ML Inject 13 Units into the skin at bedtime. 01/22/24  Yes Zonia Kief, Amy J, NP  losartan (COZAAR) 50 MG tablet Take 1 tablet (50 mg total) by mouth daily. 01/22/24 04/21/24 Yes Zonia Kief, Amy J, NP  metFORMIN (GLUCOPHAGE-XR) 500 MG 24 hr tablet Take 2 tablets (1,000 mg total) by mouth 2 (two) times daily with a meal. 01/22/24 04/21/24 Yes Zonia Kief, Amy J, NP  Insulin Pen Needle 32G X 4  MM MISC 1 each by Other route daily. Use to inject insulin once daily. 01/10/24   Rema Fendt, NP  methocarbamol (ROBAXIN) 500 MG tablet Take 1 tablet (500 mg total) by mouth 2 (two) times daily as needed for muscle spasms. Patient not taking: Reported on 01/22/2024 10/24/23   Gustavus Bryant, FNP  TRUEplus Lancets 28G MISC Use to check blood sugar 2 times a day  03/16/23   Rema Fendt, NP  bromocriptine (PARLODEL) 2.5 MG tablet 1/4 tab daily Patient not taking: Reported on 03/12/2019 01/11/18 10/06/19  Romero Belling, MD  glimepiride (AMARYL) 1 MG tablet Take 0.5 tablets (0.5 mg total) by mouth daily with breakfast. Patient not taking: Reported on 03/12/2019 01/11/18 10/06/19  Romero Belling, MD     Family History  Problem Relation Age of Onset   Diabetes Mother    Cancer Father        leukemia    Obesity Other     Social History   Socioeconomic History   Marital status: Divorced    Spouse name: Not on file   Number of children: 1   Years of education: 12+   Highest education level: 12th grade  Occupational History    Employer: [employer]  Tobacco Use   Smoking status: Never    Passive exposure: Never   Smokeless tobacco: Never  Vaping Use   Vaping status: Never Used  Substance and Sexual Activity   Alcohol use: Yes    Comment: 1 or 2 every 3 months    Drug use: No   Sexual activity: Not on file  Other Topics Concern   Not on file  Social History Narrative   Lives at home with wife and stepson.   Caffeine use: none   Has one child.   Social Drivers of Corporate investment banker Strain: Low Risk  (03/12/2023)   Overall Financial Resource Strain (CARDIA)    Difficulty of Paying Living Expenses: Not very hard  Food Insecurity: No Food Insecurity (03/12/2023)   Hunger Vital Sign    Worried About Running Out of Food in the Last Year: Never true    Ran Out of Food in the Last Year: Never true  Transportation Needs: No Transportation Needs (03/12/2023)   PRAPARE - Administrator, Civil Service (Medical): No    Lack of Transportation (Non-Medical): No  Physical Activity: Sufficiently Active (03/12/2023)   Exercise Vital Sign    Days of Exercise per Week: 7 days    Minutes of Exercise per Session: 30 min  Stress: No Stress Concern Present (03/12/2023)   [employer] of Occupational Health - Occupational Stress  Questionnaire    Feeling of Stress : Only a little  Social Connections: Unknown (03/16/2023)   Social Connection and Isolation Panel [NHANES]    Frequency of Communication with Friends and Family: Twice a week    Frequency of Social Gatherings with Friends and Family: Once a week    Attends Religious Services: Never    Database administrator or Organizations: No    Attends Engineer, structural: Never    Marital Status: Patient declined     Review of Systems: A 12 point ROS discussed and pertinent positives are indicated in the HPI above.  All other systems are negative.  Review of Systems  Constitutional:  Positive for fever (tmax 101 overnigh). Negative for chills.  Respiratory:  Negative for shortness of breath.   Cardiovascular:  Negative for chest pain.  Gastrointestinal:  Negative for abdominal pain, diarrhea, nausea and vomiting.  Musculoskeletal:        (+) right leg draining wound, pain with some movement/touching  Neurological:  Negative for headaches.    Vital Signs: BP (!) 162/84 (BP Location: Left Arm)   Pulse 84   Temp 99.7 F (37.6 C) (Oral)   Resp 15   Ht 5\' 10"  (1.778 m)   Wt 249 lb (112.9 kg)   SpO2 100%   BMI 35.73 kg/m   Physical Exam Vitals reviewed.  Constitutional:      General: He is not in acute distress. HENT:     Head: Normocephalic.  Cardiovascular:     Rate and Rhythm: Normal rate.  Pulmonary:     Effort: Pulmonary effort is normal.  Musculoskeletal:     Comments: (+) right lower extremity wound bandaged with gauze + coban wrap. Did not remove dressing as it was recently placed with specific wound care instructions. No significant edema noted above or below dressing. Patient able to move leg without issue, leaves knee bent to avoid pressure on calf.  Skin:    General: Skin is warm and dry.  Neurological:     Mental Status: He is alert and oriented to person, place, and time.  Psychiatric:        Mood and Affect: Mood normal.         Behavior: Behavior normal.        Thought Content: Thought content normal.        Judgment: Judgment normal.      Imaging: CT TIBIA FIBULA RIGHT W CONTRAST Result Date: 02/15/2024 CLINICAL DATA:  Soft tissue infection suspected, lower leg, xray done EXAM: CT OF THE LOWER RIGHT EXTREMITY WITH CONTRAST TECHNIQUE: Multidetector CT imaging of the lower right extremity was performed according to the standard protocol following intravenous contrast administration. RADIATION DOSE REDUCTION: This exam was performed according to the departmental dose-optimization program which includes automated exposure control, adjustment of the mA and/or kV according to patient size and/or use of iterative reconstruction technique. CONTRAST:  OMNIPAQUE IOHEXOL 300 MG/ML  SOLN COMPARISON:  X-ray 02/13/2024 FINDINGS: Bones/Joint/Cartilage Right tibia and fibula are intact. No fracture or dislocation. Mature calcification along the distal tibiofibular syndesmosis compatible sequela of prior trauma. No erosion or aggressive periosteal elevation. No lytic or sclerotic bone lesion. Ligaments Suboptimally assessed by CT. Muscles and Tendons Low-attenuation changes within the peripheral aspect of the left soleus muscle underlying the site of soft tissue ulceration the level of the mid calf. This area demonstrates partial rim enhancement and measures approximately 5.3 x 2.8 x 3.6 cm (series 6, image 147). Appearance favors intramuscular phlegmon/developing abscess. No additional intramuscular collections are identified. No significant tendinous abnormality by CT. Soft tissues Superficial skin wound or ulceration the lateral aspect of the proximal to mid calf. Circumferential subcutaneous edema. No fluid collections within the subcutaneous tissues. IMPRESSION: 1. Superficial skin wound or ulceration the lateral aspect of the proximal to mid calf. Low-attenuation changes within the peripheral aspect of the left soleus muscle  underlying the site of soft tissue ulceration measuring approximately 5.3 x 2.8 x 3.6 cm. Appearance favors intramuscular phlegmon/developing abscess. 2. No acute osseous abnormality. Electronically Signed   By: Duanne Guess D.O.   On: 02/15/2024 13:24   DG Tibia/Fibula Right Result Date: 02/13/2024 CLINICAL DATA:  Injuries, skin infection. EXAM: RIGHT TIBIA AND FIBULA - 2 VIEW COMPARISON:  None Available. FINDINGS: There is no evidence of fracture or other focal  bone lesions. There is diffuse soft tissue swelling of the lower extremity. No definite radiopaque foreign body identified. IMPRESSION: Diffuse soft tissue swelling of the lower extremity. No definite radiopaque foreign body identified. Electronically Signed   By: Darliss Cheney M.D.   On: 02/13/2024 23:32   VAS [place] LOWER EXTREMITY VENOUS (DVT) Result Date: 01/30/2024  Lower Venous DVT Study Patient Name:  MATSON WELCH  Date of Exam:   01/30/2024 Medical Rec #: [id number]       Accession #:    [id number] Date of Birth: [birth year]        Patient Gender: M Patient Age:   44 years Exam Location:  [facility] Procedure:      VAS [place] LOWER EXTREMITY VENOUS (DVT) Referring Phys: AMY STEPHENS --------------------------------------------------------------------------------  Indications: Swelling, and Edema.  Comparison Study: No prior exam. Performing Technologist: Fernande Bras  Examination Guidelines: A complete evaluation includes B-mode imaging, spectral Doppler, color Doppler, and power Doppler as needed of all accessible portions of each vessel. Bilateral testing is considered an integral part of a complete examination. Limited examinations for reoccurring indications may be performed as noted. The reflux portion of the exam is performed with the patient in reverse Trendelenburg.  +---------+---------------+---------+-----------+----------+--------------+ RIGHT    CompressibilityPhasicitySpontaneityPropertiesThrombus Aging  +---------+---------------+---------+-----------+----------+--------------+ CFV      Full           Yes      Yes                                 +---------+---------------+---------+-----------+----------+--------------+ SFJ      Full           Yes      Yes                                 +---------+---------------+---------+-----------+----------+--------------+ FV Prox  Full                                                        +---------+---------------+---------+-----------+----------+--------------+ FV Mid   Full                                                        +---------+---------------+---------+-----------+----------+--------------+ FV DistalFull                                                        +---------+---------------+---------+-----------+----------+--------------+ PFV      Full                                                        +---------+---------------+---------+-----------+----------+--------------+ POP      Full           Yes  Yes                                 +---------+---------------+---------+-----------+----------+--------------+ PTV      Full                                                        +---------+---------------+---------+-----------+----------+--------------+ PERO     Full                                                        +---------+---------------+---------+-----------+----------+--------------+   +---------+---------------+---------+-----------+----------+--------------+ LEFT     CompressibilityPhasicitySpontaneityPropertiesThrombus Aging +---------+---------------+---------+-----------+----------+--------------+ CFV      Full           Yes      Yes                                 +---------+---------------+---------+-----------+----------+--------------+ SFJ      Full           Yes      Yes                                  +---------+---------------+---------+-----------+----------+--------------+ FV Prox  Full                                                        +---------+---------------+---------+-----------+----------+--------------+ FV Mid   Full                                                        +---------+---------------+---------+-----------+----------+--------------+ FV DistalFull                                                        +---------+---------------+---------+-----------+----------+--------------+ PFV      Full                                                        +---------+---------------+---------+-----------+----------+--------------+ POP      Full           Yes      Yes                                 +---------+---------------+---------+-----------+----------+--------------+ PTV      Full                                                        +---------+---------------+---------+-----------+----------+--------------+  PERO     Full                                                        +---------+---------------+---------+-----------+----------+--------------+     Summary: BILATERAL: - No evidence of deep vein thrombosis seen in the lower extremities, bilaterally. -No evidence of popliteal cyst, bilaterally.   *See table(s) above for measurements and observations. Electronically signed by Coral Else MD on 01/30/2024 at 8:42:32 PM.    Final     Labs:  CBC: Recent Labs    02/13/24 2313 02/14/24 0418 02/15/24 0454 02/16/24 0737  WBC 14.5* 14.3* 16.0* 15.4*  HGB 12.6* 12.4* 11.8* 11.4*  HCT 36.6* 36.4* 34.9* 34.5*  PLT 388 381 376 365    COAGS: No results for input(s): "INR", "APTT" in the last 8760 hours.  BMP: Recent Labs    02/13/24 2313 02/14/24 0418 02/15/24 0454 02/16/24 0737  NA 129* 132* 135 135  K 3.8 3.3* 3.5 3.5  CL 94* 98 102 101  CO2 28 27 23 22   GLUCOSE 355* 290* 207* 189*  BUN 19 19 11 11   CALCIUM 8.9 8.8*  8.4* 8.6*  CREATININE 1.44* 1.11 0.78 0.87  GFRNONAA >60 >60 >60 >60    LIVER FUNCTION TESTS: Recent Labs    02/14/24 0418  BILITOT 0.4  AST 10*  ALT 19  ALKPHOS 91  PROT 6.3*  ALBUMIN 3.3*    TUMOR MARKERS: No results for input(s): "AFPTM", "CEA", "CA199", "CHROMGRNA" in the last 8760 hours.  Assessment and Plan:  43 y/o M with history of HTN, HLD, DM who presented to the ED 3/26 with complaints of worsening right lower extremity wound after scraping his leg on a pallet at work about 3 days earlier. He has had worsening leukocytosis, intermittent fevers and increased purulent drainage from the wound despite several days of antibiotics. Orthopedic surgery was consulted who has recommended IR aspiration/possible drain placement for further evaluation.  Patient history and imaging reviewed by Dr. Lowella Dandy who approves aspiration only of the right calf fluid collection with local anesthesia. Patient and wife aware there may not be an organized fluid collection which is sufficient for drainage however we will take a look with [place] and proceed if there is sufficient fluid that is safely approachable.   Plan: - US aspiration right calf fluid collection today with Dr. Lowella Dandy, tentatively planned for noon - 1pm depending on [place] availability. We will call for patient when ready. - Local anesthesia only, may continue diet - No additional labs needed - Per orthopedic surgery note from today, send aspirate for microbiology and pathology  Risks and benefits discussed with the patient including bleeding, infection, damage to adjacent structures, low yield . All of the patient's questions were answered, patient is agreeable to proceed.  Consent signed and in chart.  Thank you for this interesting consult.  I greatly enjoyed meeting ALAND CHESTNUTT and look forward to participating in their care.  A copy of this report was sent to the requesting provider on this date.  Electronically Signed: Villa Herb, PA-C 02/16/2024, 10:15 AM   I spent a total of 20 Minutes  in face to face in clinical consultation, greater than 50% of which was counseling/coordinating care for right calf fluid colletion.

## 2024-02-16 NOTE — Progress Notes (Signed)
 PROGRESS NOTE    Ian Hughes  [name]:[id number] DOB: 02-21-1981 DOA: 02/13/2024 PCP: Rema Fendt, NP    Brief Narrative:   Ian Hughes is a 44 y.o. male with past medical history significant for HTN, HLD, DM2, obesity who presented to [facility] ED on 3/26 with wound to right lower extremity.  Patient reports scraping his leg on a pallet at work roughly 3 days prior to ED presentation.  He tried to clean the wound up as best he could at home but due to increased swelling, drainage and worsening he sought further care in the ED.  Patient denies fever, no chills, no nausea/vomiting.  In the ED, temperature 100.7 F, HR 86, RR 16, BP 129/73, SpO2 99% on room air.  WBC 14.5, hemoglobin 12.6, platelet count 388.  Sodium 129, potassium 3.8, chloride 94, CO2 28, glucose 355, BUN 19, creatinine 1.44.  Blood cultures x 2: Obtained.  X-ray right tibia/fibula with diffuse soft tissue swelling of the lower extremity, no definite radiopaque foreign body identified.  Patient was started on IV vancomycin and cefepime.  TRH consulted for admission.  Assessment & Plan:   Right lower extremity wound/cellulitis with abscess/phlegmon Left lower extremity wound Patient presenting to ED with 3-day history of progressive purulent wound to right lower extremity following trauma after scraping against a wooden pallet at work.  Patient with temperature up to 100.7 in the ED; WBC count elevated 14.5.  X-ray right tibia/fibula with diffuse soft tissue swelling. CT right tibia/fibula: With superficial soft tissue wound/defect lateral aspect proximal mid calf with 5.3 x 2.8 x 3.6 cm intramuscular phlegmon versus developing abscess soleus muscle -- WBC 14.5>14.3>16.0>15.4 -- Vancomycin, pharmacy consulted for dosing/monitoring -- Change ceftriaxone to cefepime for broadened coverage given poorly controlled diabetes and need for Pseudomonas coverage -- Orthopedics consulted, recommend IR drain placement -- CBC  daily  Wound care: Cleanse wounds to R lower leg with Vashe wound cleanser, do not rinse and allow to air dry. Apply Medihoney to wound beds daily, cover with dry gauze then ABD pads. Place Xeroform gauze L anterior lower leg daily, cover with ABD pad. Secure dressings to B lower legs with Kerlix roll gauze wrapped beginning just above toes and ending right below knees. May apply Ace bandage wrapped in same fashion as Kerlix roll gauze for light compression.   Acute renal failure: Resolved Patient presenting with an elevated creatinine of 1.44.  Given IV fluid bolus followed by continuous infusion with resolution and improvement of creatinine to 0.78. -- BMP daily  Hyponatremia Etiology likely hypovolemic hyponatremia in the setting of poor oral intake in the days preceding hospitalization combined with pseudohyponatremia with elevated glucose.  Supported with IV fluid hydration with improvement of sodium level. -- Na 129>135 -- BMP daily  Type 2 diabetes mellitus, with hyperglycemia Hemoglobin A1c 12.8 on 01/08/2024; poorly controlled.  Home regimen includes insulin glargine 13 units subcutaneously daily, metformin 1000 mg p.o. twice daily. -- Diabetic educator following, appreciate assistance -- Dietitian consult -- Semglee 20u [place] daily -- Moderate SSI for coverage -- CBG before every meal/at bedtime  Essential hypertension -- Amlodipine 5 mg p.o. daily -- Losartan 50 mg p.o. nightly -- Hydrochlorothiazide 12.5 mg p.o. daily  Hyperlipidemia -- Atorvastatin 10 mg p.o. daily  Obesity, class II Body mass index is 35.73 kg/m.   DVT prophylaxis: enoxaparin (LOVENOX) injection 40 mg Start: 02/14/24 1800    Code Status: Full Code Family Communication: Updated family present at bedside this morning  Disposition Plan:  Level of care: Telemetry Status is: Inpatient Remains inpatient appropriate because: IV antibiotics; pending IR drain placement    Consultants:  Orthopedics, Dr.  Odis Hollingshead Interventional radiology  Procedures:  None  Antimicrobials:  Vancomycin 3/26>> Ceftriaxone 3/27>> Cefepime 3/26 - 3/27   Subjective: Patient seen examined bedside, lying in bed.  Patient with recurrent fever overnight, 101.1 F.  CT right lower extremity yesterday shows intramuscular phlegmon versus developing abscess to the soleus muscle.  Consulted orthopedics with recommendation of IR for drain placement.  Given recurrent fever on IV antibiotics for 2 days, will escalate to cefepime for broaden coverage given poorly controlled diabetes and need for Pseudomonas coverage.  Continue to discuss need for improved diabetic control as complicating factor.  Denies headache, no chills/night sweats, no nausea/vomitus diarrhea, no chest pain, no palpitations, no abdominal pain, no focal weakness, no cough/congestion, no paresthesias.  No acute events overnight per nurse staff.  Objective: Vitals:   02/15/24 1202 02/15/24 2003 02/15/24 2108 02/16/24 0524  BP: (!) 176/96 (!) 153/87  (!) 162/84  Pulse: 89 98  84  Resp: 16 18  15   Temp: 98.8 F (37.1 C) (!) 101.1 F (38.4 C) 100.3 F (37.9 C) 99.7 F (37.6 C)  TempSrc: Oral Oral Oral Oral  SpO2: 100% 98%  100%  Weight:      Height:        Intake/Output Summary (Last 24 hours) at 02/16/2024 1031 Last data filed at 02/16/2024 0400 Gross per 24 hour  Intake 730.27 ml  Output --  Net 730.27 ml   Filed Weights   02/13/24 2302  Weight: 112.9 kg    Examination:  Physical Exam: GEN: NAD, alert and oriented x 3, obese HEENT: NCAT, PERRL, EOMI, sclera clear, MMM PULM: CTAB w/o wheezes/crackles, normal respiratory effort, on room air CV: RRR w/o M/G/R GI: abd soft, NTND, NABS, no R/G/M MSK: + peripheral edema, muscle strength globally intact 5/5 bilateral upper/lower extremities NEURO: CN II-XII intact, no focal deficits, sensation to light touch intact PSYCH: normal mood/affect Integumentary: Bilateral lower extremity  wounds, cellulitis as depicted below            Data Reviewed: I have personally reviewed following labs and imaging studies  CBC: Recent Labs  Lab 02/13/24 2313 02/14/24 0418 02/15/24 0454 02/16/24 0737  WBC 14.5* 14.3* 16.0* 15.4*  NEUTROABS 11.4*  --   --   --   HGB 12.6* 12.4* 11.8* 11.4*  HCT 36.6* 36.4* 34.9* 34.5*  MCV 79.7* 79.8* 81.9 83.7  PLT 388 381 376 365   Basic Metabolic Panel: Recent Labs  Lab 02/13/24 2313 02/14/24 0418 02/15/24 0454 02/16/24 0737  NA 129* 132* 135 135  K 3.8 3.3* 3.5 3.5  CL 94* 98 102 101  CO2 28 27 23 22   GLUCOSE 355* 290* 207* 189*  BUN 19 19 11 11   CREATININE 1.44* 1.11 0.78 0.87  CALCIUM 8.9 8.8* 8.4* 8.6*  MG  --   --   --  2.0   GFR: Estimated Creatinine Clearance: 139.2 mL/min (by C-G formula based on SCr of 0.87 mg/dL). Liver Function Tests: Recent Labs  Lab 02/14/24 0418  AST 10*  ALT 19  ALKPHOS 91  BILITOT 0.4  PROT 6.3*  ALBUMIN 3.3*   No results for input(s): "LIPASE", "AMYLASE" in the last 168 hours. No results for input(s): "AMMONIA" in the last 168 hours. Coagulation Profile: No results for input(s): "INR", "PROTIME" in the last 168 hours. Cardiac Enzymes: No results  for input(s): "CKTOTAL", "CKMB", "CKMBINDEX", "TROPONINI" in the last 168 hours. BNP (last 3 results) No results for input(s): "PROBNP" in the last 8760 hours. HbA1C: No results for input(s): "HGBA1C" in the last 72 hours. CBG: Recent Labs  Lab 02/15/24 0715 02/15/24 1158 02/15/24 1610 02/15/24 2005 02/16/24 0752  GLUCAP 199* 252* 173* 213* 219*   Lipid Profile: No results for input(s): "CHOL", "HDL", "LDLCALC", "TRIG", "CHOLHDL", "LDLDIRECT" in the last 72 hours. Thyroid Function Tests: No results for input(s): "TSH", "T4TOTAL", "FREET4", "T3FREE", "THYROIDAB" in the last 72 hours. Anemia Panel: No results for input(s): "VITAMINB12", "FOLATE", "FERRITIN", "TIBC", "IRON", "RETICCTPCT" in the last 72 hours. Sepsis  Labs: Recent Labs  Lab 02/13/24 2313  LATICACIDVEN 0.9    Recent Results (from the past 240 hours)  Culture, blood (Routine X 2) w Reflex to ID Panel     Status: None (Preliminary result)   Collection Time: 02/14/24  1:00 AM   Specimen: BLOOD LEFT ARM  Result Value Ref Range Status   Specimen Description   Final    BLOOD LEFT ARM Performed at [facility], [address], [place], [place] [address]    Special Requests   Final    BOTTLES DRAWN AEROBIC AND ANAEROBIC Blood Culture adequate volume Performed at [facility], [address] [place], [place] [address]    Culture   Final    NO GROWTH 2 DAYS Performed at [facility], [address] [place], [place] [address]    Report Status PENDING  Incomplete  Culture, blood (Routine X 2) w Reflex to ID Panel     Status: None (Preliminary result)   Collection Time: 02/14/24  1:15 AM   Specimen: BLOOD RIGHT ARM  Result Value Ref Range Status   Specimen Description   Final    BLOOD RIGHT ARM Performed at [facility], [address], [place], [place] [address]    Special Requests   Final    BOTTLES DRAWN AEROBIC AND ANAEROBIC Blood Culture adequate volume Performed at [facility], [address], [place], [place] [address]    Culture   Final    NO GROWTH 2 DAYS Performed at [facility], [address], [place], [place] [address]    Report Status PENDING  Incomplete         Radiology Studies: CT TIBIA FIBULA RIGHT W CONTRAST Result Date: 02/15/2024 CLINICAL DATA:  Soft tissue infection suspected, lower leg, xray done EXAM: CT OF THE LOWER RIGHT EXTREMITY WITH CONTRAST TECHNIQUE: Multidetector CT imaging of the lower right extremity was performed according to the standard protocol following intravenous contrast administration. RADIATION DOSE REDUCTION: This exam was performed according to the departmental dose-optimization program  which includes automated exposure control, adjustment of the mA and/or kV according to patient size and/or use of iterative reconstruction technique. CONTRAST:  OMNIPAQUE IOHEXOL 300 MG/ML  SOLN COMPARISON:  X-ray 02/13/2024 FINDINGS: Bones/Joint/Cartilage Right tibia and fibula are intact. No fracture or dislocation. Mature calcification along the distal tibiofibular syndesmosis compatible sequela of prior trauma. No erosion or aggressive periosteal elevation. No lytic or sclerotic bone lesion. Ligaments Suboptimally assessed by CT. Muscles and Tendons Low-attenuation changes within the peripheral aspect of the left soleus muscle underlying the site of soft tissue ulceration the level of the mid calf. This area demonstrates partial rim enhancement and measures approximately 5.3 x 2.8 x 3.6 cm (series 6, image 147). Appearance favors intramuscular phlegmon/developing abscess. No additional intramuscular collections are identified.  No significant tendinous abnormality by CT. Soft tissues Superficial skin wound or ulceration the lateral aspect of the proximal to mid calf. Circumferential subcutaneous edema. No fluid collections within the subcutaneous tissues. IMPRESSION: 1. Superficial skin wound or ulceration the lateral aspect of the proximal to mid calf. Low-attenuation changes within the peripheral aspect of the left soleus muscle underlying the site of soft tissue ulceration measuring approximately 5.3 x 2.8 x 3.6 cm. Appearance favors intramuscular phlegmon/developing abscess. 2. No acute osseous abnormality. Electronically Signed   By: Duanne Guess D.O.   On: 02/15/2024 13:24        Scheduled Meds:  amLODipine  5 mg Oral Daily   ascorbic acid  500 mg Oral BID   atorvastatin  10 mg Oral QHS   enoxaparin (LOVENOX) injection  40 mg Subcutaneous Q24H   gabapentin  300 mg Oral QHS   hydrochlorothiazide  12.5 mg Oral Daily   insulin aspart  0-15 Units Subcutaneous TID WC   insulin aspart   0-5 Units Subcutaneous QHS   insulin glargine-yfgn  20 Units Subcutaneous QHS   leptospermum manuka honey  1 Application Topical Daily   losartan  50 mg Oral QHS   Ensure Max Protein  11 oz Oral Daily   zinc sulfate (50mg  elemental zinc)  220 mg Oral Daily   Continuous Infusions:  ceFEPime (MAXIPIME) IV 2 g (02/16/24 0947)   vancomycin 1,500 mg (02/16/24 0227)     LOS: 2 days    Time spent: 52 minutes spent on 02/16/2024 caring for this patient face-to-face including chart review, ordering labs/tests, documenting, discussion with nursing staff, consultants, updating family and interview/physical exam    Alvira Philips [place], DO [facility] Available via Epic secure chat 7am-7pm After these hours, please refer to coverage provider listed on [email or web address] 02/16/2024, 10:31 AM

## 2024-02-16 NOTE — Procedures (Signed)
 Interventional Radiology Procedure:   Indications: Right calf wound and intramuscular abscess  Procedure: US guided aspiration of right calf  Findings: Edematous right calf musculature near lateral wound.  Aspirated several hypoechoic areas within the muscle and aspirated 3 ml of thick bloody purulent fluid.   Complications: None     EBL: Minimal  Plan: Send fluid for culture.  Viktoria Gruetzmacher R. Lowella Dandy, MD  Pager: [phone]

## 2024-02-16 NOTE — Plan of Care (Signed)
  Problem: Fluid Volume: Goal: Ability to maintain a balanced intake and output will improve Outcome: Progressing   Problem: Health Behavior/Discharge Planning: Goal: Ability to manage health-related needs will improve Outcome: Progressing   Problem: Nutritional: Goal: Progress toward achieving an optimal weight will improve Outcome: Progressing   Problem: Education: Goal: Knowledge of General Education information will improve Description: Including pain rating scale, medication(s)/side effects and non-pharmacologic comfort measures Outcome: Progressing   Problem: Clinical Measurements: Goal: Will remain free from infection Outcome: Progressing   Problem: Nutrition: Goal: Adequate nutrition will be maintained Outcome: Progressing   Problem: Elimination: Goal: Will not experience complications related to urinary retention Outcome: Progressing   Problem: Safety: Goal: Ability to remain free from injury will improve Outcome: Progressing   Problem: Skin Integrity: Goal: Risk for impaired skin integrity will decrease Outcome: Progressing

## 2024-02-17 ENCOUNTER — Encounter (HOSPITAL_COMMUNITY): Payer: Self-pay | Admitting: Internal Medicine

## 2024-02-17 DIAGNOSIS — L03115 Cellulitis of right lower limb: Secondary | ICD-10-CM | POA: Diagnosis not present

## 2024-02-17 LAB — CBC
HCT: 40.3 % (ref 39.0–52.0)
Hemoglobin: 12.9 g/dL — ABNORMAL LOW (ref 13.0–17.0)
MCH: 27.2 pg (ref 26.0–34.0)
MCHC: 32 g/dL (ref 30.0–36.0)
MCV: 84.8 fL (ref 80.0–100.0)
Platelets: 383 10*3/uL (ref 150–400)
RBC: 4.75 MIL/uL (ref 4.22–5.81)
RDW: 13.1 % (ref 11.5–15.5)
WBC: 12.3 10*3/uL — ABNORMAL HIGH (ref 4.0–10.5)
nRBC: 0 % (ref 0.0–0.2)

## 2024-02-17 LAB — BASIC METABOLIC PANEL WITH GFR
Anion gap: 11 (ref 5–15)
BUN: 14 mg/dL (ref 6–20)
CO2: 24 mmol/L (ref 22–32)
Calcium: 8.8 mg/dL — ABNORMAL LOW (ref 8.9–10.3)
Chloride: 99 mmol/L (ref 98–111)
Creatinine, Ser: 0.84 mg/dL (ref 0.61–1.24)
GFR, Estimated: >60 mL/min (ref >60)
Glucose, Bld: 223 mg/dL — ABNORMAL HIGH (ref 70–99)
Potassium: 3.8 mmol/L (ref 3.5–5.1)
Sodium: 134 mmol/L — ABNORMAL LOW (ref 135–145)

## 2024-02-17 LAB — GLUCOSE, CAPILLARY
Glucose-Capillary: 201 mg/dL — ABNORMAL HIGH (ref 70–99)
Glucose-Capillary: 299 mg/dL — ABNORMAL HIGH (ref 70–99)
Glucose-Capillary: 209 mg/dL — ABNORMAL HIGH (ref 70–99)
Glucose-Capillary: 200 mg/dL — ABNORMAL HIGH (ref 70–99)

## 2024-02-17 MED ORDER — INSULIN GLARGINE-YFGN 100 UNIT/ML ~~LOC~~ SOLN
25.0000 [IU] | Freq: Every day | SUBCUTANEOUS | Status: DC
  Administered 2024-02-17 – 2024-02-22 (×6): 25 [IU] via SUBCUTANEOUS
  Filled 2024-02-17 (×8): qty 0.25

## 2024-02-17 NOTE — Plan of Care (Signed)
  Problem: Education: Goal: Ability to describe self-care measures that may prevent or decrease complications (Diabetes Survival Skills Education) will improve Outcome: Progressing Goal: Individualized Educational Video(s) Outcome: Progressing   Problem: Coping: Goal: Ability to adjust to condition or change in health will improve Outcome: Progressing   Problem: Fluid Volume: Goal: Ability to maintain a balanced intake and output will improve Outcome: Progressing   Problem: Health Behavior/Discharge Planning: Goal: Ability to identify and utilize available resources and services will improve Outcome: Progressing Goal: Ability to manage health-related needs will improve Outcome: Progressing   Problem: Metabolic: Goal: Ability to maintain appropriate glucose levels will improve Outcome: Progressing   Problem: Nutritional: Goal: Maintenance of adequate nutrition will improve Outcome: Progressing Goal: Progress toward achieving an optimal weight will improve Outcome: Progressing   Problem: Tissue Perfusion: Goal: Adequacy of tissue perfusion will improve Outcome: Progressing   Problem: Education: Goal: Knowledge of General Education information will improve Description: Including pain rating scale, medication(s)/side effects and non-pharmacologic comfort measures Outcome: Progressing   Problem: Health Behavior/Discharge Planning: Goal: Ability to manage health-related needs will improve Outcome: Progressing   Problem: Clinical Measurements: Goal: Ability to maintain clinical measurements within normal limits will improve Outcome: Progressing Goal: Will remain free from infection Outcome: Progressing Goal: Diagnostic test results will improve Outcome: Progressing Goal: Respiratory complications will improve Outcome: Progressing Goal: Cardiovascular complication will be avoided Outcome: Progressing   Problem: Activity: Goal: Risk for activity intolerance will  decrease Outcome: Progressing   Problem: Nutrition: Goal: Adequate nutrition will be maintained Outcome: Progressing   Problem: Coping: Goal: Level of anxiety will decrease Outcome: Progressing   Problem: Elimination: Goal: Will not experience complications related to bowel motility Outcome: Progressing Goal: Will not experience complications related to urinary retention Outcome: Progressing   Problem: Skin Integrity: Goal: Risk for impaired skin integrity will decrease Outcome: Not Progressing   Problem: Pain Managment: Goal: General experience of comfort will improve and/or be controlled Outcome: Not Progressing   Problem: Safety: Goal: Ability to remain free from injury will improve Outcome: Not Progressing   Problem: Skin Integrity: Goal: Risk for impaired skin integrity will decrease Outcome: Not Progressing

## 2024-02-17 NOTE — Progress Notes (Signed)
 Report called to receiving RN at [facility]. All questions and concerns addressed at this time. Patient en route via carelink at this time.

## 2024-02-17 NOTE — Care Plan (Signed)
 Orthopaedic Surgery Plan of Care Note   -pt s/p aspiration by IR of right proximal calf -cx with [facility] -reviewed case with Dr Lajoyce Corners who has kindly agreed to help manage further care -informed primary team to transfer pt to [facility] -please keep NPO from [place] on Wed 4/2 for surgery that day to perform operative I&D   Netta Cedars, MD Orthopaedic Surgery [facility]

## 2024-02-17 NOTE — Progress Notes (Signed)
 Brief Nutrition Note  RD received a consult for diet education. RD working remotely at time of assessment.   Pt recently evaluated by RD on 3/28 and was provided some diet education. Previous RD placed DM diet education in AVS already.  Pt currently on a Heart Healthy/Carb Modified diet, meal intakes 50-100% completion for three meals. Labs and medication reviewed.   RD team to continue to follow while admitted.  Kirby Crigler RD, LDN Clinical Dietitian

## 2024-02-17 NOTE — Progress Notes (Addendum)
 PROGRESS NOTE    Ian Hughes  VWU:[id number] DOB: 06-27-1981 DOA: 02/13/2024 PCP: Rema Fendt, NP    Brief Narrative:   Ian Hughes is a 43 y.o. male with past medical history significant for HTN, HLD, DM2, obesity who presented to [facility] ED on 3/26 with wound to right lower extremity.  Patient reports scraping his leg on a pallet at work roughly 3 days prior to ED presentation.  He tried to clean the wound up as best he could at home but due to increased swelling, drainage and worsening he sought further care in the ED.  Patient denies fever, no chills, no nausea/vomiting.  In the ED, temperature 100.7 F, HR 86, RR 16, BP 129/73, SpO2 99% on room air.  WBC 14.5, hemoglobin 12.6, platelet count 388.  Sodium 129, potassium 3.8, chloride 94, CO2 28, glucose 355, BUN 19, creatinine 1.44.  Blood cultures x 2: Obtained.  X-ray right tibia/fibula with diffuse soft tissue swelling of the lower extremity, no definite radiopaque foreign body identified.  Patient was started on IV vancomycin and cefepime.  TRH consulted for admission.  Assessment & Plan:   Right lower extremity wound/cellulitis with abscess/phlegmon Left lower extremity wound Patient presenting to ED with 3-day history of progressive purulent wound to right lower extremity following trauma after scraping against a wooden pallet at work.  Patient with temperature up to 100.7 in the ED; WBC count elevated 14.5.  X-ray right tibia/fibula with diffuse soft tissue swelling. CT right tibia/fibula: With superficial soft tissue wound/defect lateral aspect proximal mid calf with 5.3 x 2.8 x 3.6 cm intramuscular phlegmon versus developing abscess soleus muscle.  Orthopedics was consulted; recommended IR aspiration which was performed on 02/16/2024. --Orthopedics following, appreciate assistance -- WBC 14.5>14.3>16.0>15.4>12.3 -- Wound aspiration culture: Moderate GPC's, further identification/susceptibilities pending --  Vancomycin, pharmacy consulted for dosing/monitoring -- Cefepime 2g IV q24h -- CBC daily  Addendum: Orthopedics, Dr. Odis Hollingshead request transfer to [facility] to be evaluated by Dr. Lajoyce Corners for potential surgical debridement on Wednesday 02/20/2024  Wound care: Cleanse wounds to R lower leg with Vashe wound cleanser, do not rinse and allow to air dry. Apply Medihoney to wound beds daily, cover with dry gauze then ABD pads. Place Xeroform gauze L anterior lower leg daily, cover with ABD pad. Secure dressings to B lower legs with Kerlix roll gauze wrapped beginning just above toes and ending right below knees. May apply Ace bandage wrapped in same fashion as Kerlix roll gauze for light compression.   Acute renal failure: Resolved Patient presenting with an elevated creatinine of 1.44.  Given IV fluid bolus followed by continuous infusion with resolution and improvement of creatinine to 0.78. -- BMP daily  Hyponatremia Etiology likely hypovolemic hyponatremia in the setting of poor oral intake in the days preceding hospitalization combined with pseudohyponatremia with elevated glucose.  Supported with IV fluid hydration with improvement of sodium level. -- Na [phone] -- BMP daily  Type 2 diabetes mellitus, with hyperglycemia Hemoglobin A1c 12.8 on 01/08/2024; poorly controlled.  Home regimen includes insulin glargine 13 units subcutaneously daily, metformin 1000 mg p.o. twice daily. -- Diabetic educator following, appreciate assistance -- Dietitian consult -- Semglee increased to 25u [place] daily -- Moderate SSI for coverage -- CBG before every meal/at bedtime  Essential hypertension -- Amlodipine 5 mg p.o. daily -- Losartan 50 mg p.o. nightly -- Hydrochlorothiazide 12.5 mg p.o. daily  Hyperlipidemia -- Atorvastatin 10 mg p.o. daily  Obesity, class II Body mass index  is 35.73 kg/m.   DVT prophylaxis: enoxaparin (LOVENOX) injection 40 mg Start: 02/14/24 1800    Code Status: Full  Code Family Communication: No family present at bedside this morning Disposition Plan:  Level of care: Telemetry Status is: Inpatient Remains inpatient appropriate because: IV antibiotics; pending wound culture results    Consultants:  Orthopedics, Dr. Odis Hollingshead Interventional radiology  Procedures:  None  Antimicrobials:  Vancomycin 3/26>> Ceftriaxone 3/27>> Cefepime 3/26 - 3/27   Subjective: Patient seen examined bedside, lying in bed.  Continues with fevers overnight, 101.4 F.  Underwent IR aspiration of right leg wound yesterday.  Initial culture showing moderate GPC's.  Patient reports pain/pressure to right lower extremity improved.  Remains on IV antibiotics.  Increasing long-acting insulin dose due to persistent hyperglycemia today.  Denies headache, no chills/night sweats, no nausea/vomitus diarrhea, no chest pain, no palpitations, no abdominal pain, no focal weakness, no cough/congestion, no paresthesias.  No acute events overnight per nursing staff.  Objective: Vitals:   02/16/24 0524 02/16/24 1317 02/16/24 2016 02/17/24 0509  BP: (!) 162/84 (!) 178/97 (!) 149/79 (!) 148/93  Pulse: 84 95 93 78  Resp: 15 20 18 20   Temp: 99.7 F (37.6 C) 99.8 F (37.7 C) (!) 101.4 F (38.6 C) 98.8 F (37.1 C)  TempSrc: Oral Oral Oral Oral  SpO2: 100% 98% 96% 98%  Weight:      Height:        Intake/Output Summary (Last 24 hours) at 02/17/2024 1053 Last data filed at 02/17/2024 0900 Gross per 24 hour  Intake 949.83 ml  Output 700 ml  Net 249.83 ml   Filed Weights   02/13/24 2302  Weight: 112.9 kg    Examination:  Physical Exam: GEN: NAD, alert and oriented x 3, obese HEENT: NCAT, PERRL, EOMI, sclera clear, MMM PULM: CTAB w/o wheezes/crackles, normal respiratory effort, on room air CV: RRR w/o M/G/R GI: abd soft, NTND, NABS, no R/G/M MSK: + peripheral edema, muscle strength globally intact 5/5 bilateral upper/lower extremities NEURO: CN II-XII intact, no focal  deficits, sensation to light touch intact PSYCH: normal mood/affect Integumentary: Bilateral lower extremity wounds, cellulitis as depicted below            Data Reviewed: I have personally reviewed following labs and imaging studies  CBC: Recent Labs  Lab 02/13/24 2313 02/14/24 0418 02/15/24 0454 02/16/24 0737 02/17/24 0900  WBC 14.5* 14.3* 16.0* 15.4* 12.3*  NEUTROABS 11.4*  --   --   --   --   HGB 12.6* 12.4* 11.8* 11.4* 12.9*  HCT 36.6* 36.4* 34.9* 34.5* 40.3  MCV 79.7* 79.8* 81.9 83.7 84.8  PLT 388 381 376 365 383   Basic Metabolic Panel: Recent Labs  Lab 02/13/24 2313 02/14/24 0418 02/15/24 0454 02/16/24 0737 02/17/24 0900  NA 129* 132* 135 135 134*  K 3.8 3.3* 3.5 3.5 3.8  CL 94* 98 102 101 99  CO2 28 27 23 22 24   GLUCOSE 355* 290* 207* 189* 223*  BUN 19 19 11 11 14   CREATININE 1.44* 1.11 0.78 0.87 0.84  CALCIUM 8.9 8.8* 8.4* 8.6* 8.8*  MG  --   --   --  2.0  --    GFR: Estimated Creatinine Clearance: 144.2 mL/min (by C-G formula based on SCr of 0.84 mg/dL). Liver Function Tests: Recent Labs  Lab 02/14/24 0418  AST 10*  ALT 19  ALKPHOS 91  BILITOT 0.4  PROT 6.3*  ALBUMIN 3.3*   No results for input(s): "LIPASE", "AMYLASE" in the  last 168 hours. No results for input(s): "AMMONIA" in the last 168 hours. Coagulation Profile: No results for input(s): "INR", "PROTIME" in the last 168 hours. Cardiac Enzymes: No results for input(s): "CKTOTAL", "CKMB", "CKMBINDEX", "TROPONINI" in the last 168 hours. BNP (last 3 results) No results for input(s): "PROBNP" in the last 8760 hours. HbA1C: No results for input(s): "HGBA1C" in the last 72 hours. CBG: Recent Labs  Lab 02/16/24 0752 02/16/24 1314 02/16/24 1646 02/16/24 2038 02/17/24 0731  GLUCAP 219* 238* 175* 157* 201*   Lipid Profile: No results for input(s): "CHOL", "HDL", "LDLCALC", "TRIG", "CHOLHDL", "LDLDIRECT" in the last 72 hours. Thyroid Function Tests: No results for input(s):  "TSH", "T4TOTAL", "FREET4", "T3FREE", "THYROIDAB" in the last 72 hours. Anemia Panel: No results for input(s): "VITAMINB12", "FOLATE", "FERRITIN", "TIBC", "IRON", "RETICCTPCT" in the last 72 hours. Sepsis Labs: Recent Labs  Lab 02/13/24 2313  LATICACIDVEN 0.9    Recent Results (from the past 240 hours)  Culture, blood (Routine X 2) w Reflex to ID Panel     Status: None (Preliminary result)   Collection Time: 02/14/24  1:00 AM   Specimen: BLOOD LEFT ARM  Result Value Ref Range Status   Specimen Description   Final    BLOOD LEFT ARM Performed at [facility], [address], [place], [place] [address]    Special Requests   Final    BOTTLES DRAWN AEROBIC AND ANAEROBIC Blood Culture adequate volume Performed at [facility], [address] [place], [place] [address]    Culture   Final    NO GROWTH 3 DAYS Performed at [facility], [address], [place], [place] [address]    Report Status PENDING  Incomplete  Culture, blood (Routine X 2) w Reflex to ID Panel     Status: None (Preliminary result)   Collection Time: 02/14/24  1:15 AM   Specimen: BLOOD RIGHT ARM  Result Value Ref Range Status   Specimen Description   Final    BLOOD RIGHT ARM Performed at [facility], [address], [place], [place] [address]    Special Requests   Final    BOTTLES DRAWN AEROBIC AND ANAEROBIC Blood Culture adequate volume Performed at [facility], [address] [place], [place] [address]    Culture   Final    NO GROWTH 3 DAYS Performed at [facility], [address] [place], [place] [address]    Report Status PENDING  Incomplete  Aerobic/Anaerobic Culture w Gram Stain (surgical/deep wound)     Status: None (Preliminary result)   Collection Time: 02/16/24  1:29 PM   Specimen: Abscess  Result Value Ref Range Status   Specimen Description   Final    ABSCESS Performed at [facility], [address] [place], [place] [address]    Special Requests   Final    NONE Performed at [facility], [address] [place], [place] [address]    Gram Stain   Final    ABUNDANT WBC PRESENT, PREDOMINANTLY PMN MODERATE GRAM POSITIVE COCCI    Culture   Final    CULTURE REINCUBATED FOR BETTER GROWTH Performed at [facility], [address] [place], [place] [address]    Report Status PENDING  Incomplete         Radiology Studies: [place] IMAGE GUIDED DRAINAGE BY PERCUTANEOUS CATHETER Result Date: 02/16/2024 INDICATION: Soft tissue infection with a wound on the lateral aspect of the right calf.  CT raised concern for an intramuscular abscess. EXAM: ULTRASOUND-GUIDED ASPIRATION OF RIGHT CALF INTRAMUSCULAR ABSCESS MEDICATIONS: % lidocaine for local anesthetic ANESTHESIA/SEDATION: None COMPLICATIONS: None immediate. PROCEDURE: Informed written consent was obtained from the patient after a thorough discussion of the procedural risks, benefits and alternatives. All questions were addressed. A timeout was performed prior to the initiation of the procedure. Ultrasound was used to evaluate the lateral aspect of the right calf. Edematous muscle tissue was identified adjacent to the right fibula. This corresponded with the area of abnormality on recent CT. The overlying skin and wound were prepped with Betadine. Sterile field was created. Skin was anesthetized using 1% lidocaine. Using ultrasound guidance, a 20 gauge spinal needle was directed into the edematous muscle. Multiple hypoechoic areas were aspirated using ultrasound guidance. Total of 3 mL of bloody purulent fluid was aspirated. Bandage placed over the puncture site. FINDINGS: Superficial wound on the lateral aspect of the right calf. Beneath and just posterior to the wound, there is edematous muscular tissue corresponding with the area of concern. Needle was directed into multiple hypoechoic areas and small  amount of thick purulent fluid was aspirated. IMPRESSION: Ultrasound-guided aspiration of purulent fluid from a complex intramuscular abscess. Electronically Signed   By: Richarda Overlie M.D.   On: 02/16/2024 14:39        Scheduled Meds:  amLODipine  5 mg Oral Daily   ascorbic acid  500 mg Oral BID   atorvastatin  10 mg Oral QHS   enoxaparin (LOVENOX) injection  40 mg Subcutaneous Q24H   gabapentin  300 mg Oral QHS   hydrochlorothiazide  12.5 mg Oral Daily   insulin aspart  0-15 Units Subcutaneous TID WC   insulin aspart  0-5 Units Subcutaneous QHS   insulin glargine-yfgn  20 Units Subcutaneous QHS   leptospermum manuka honey  1 Application Topical Daily   losartan  50 mg Oral QHS   Ensure Max Protein  11 oz Oral Daily   zinc sulfate (50mg  elemental zinc)  220 mg Oral Daily   Continuous Infusions:  ceFEPime (MAXIPIME) IV 2 g (02/17/24 0840)   vancomycin 1,500 mg (02/17/24 0332)     LOS: 3 days    Time spent: 46 minutes spent on 02/17/2024 caring for this patient face-to-face including chart review, ordering labs/tests, documenting, discussion with nursing staff, consultants, updating family and interview/physical exam    Alvira Philips [place], DO [facility] Available via Epic secure chat 7am-7pm After these hours, please refer to coverage provider listed on [email or web address] 02/17/2024, 10:53 AM

## 2024-02-17 NOTE — Plan of Care (Signed)

## 2024-02-18 DIAGNOSIS — E669 Obesity, unspecified: Secondary | ICD-10-CM | POA: Diagnosis not present

## 2024-02-18 DIAGNOSIS — L02415 Cutaneous abscess of right lower limb: Secondary | ICD-10-CM | POA: Diagnosis not present

## 2024-02-18 DIAGNOSIS — L03115 Cellulitis of right lower limb: Secondary | ICD-10-CM | POA: Diagnosis not present

## 2024-02-18 DIAGNOSIS — E1169 Type 2 diabetes mellitus with other specified complication: Secondary | ICD-10-CM | POA: Diagnosis not present

## 2024-02-18 LAB — GLUCOSE, CAPILLARY
Glucose-Capillary: 204 mg/dL — ABNORMAL HIGH (ref 70–99)
Glucose-Capillary: 258 mg/dL — ABNORMAL HIGH (ref 70–99)
Glucose-Capillary: 185 mg/dL — ABNORMAL HIGH (ref 70–99)
Glucose-Capillary: 209 mg/dL — ABNORMAL HIGH (ref 70–99)
Glucose-Capillary: 233 mg/dL — ABNORMAL HIGH (ref 70–99)

## 2024-02-18 LAB — BASIC METABOLIC PANEL WITH GFR
Anion gap: 10 (ref 5–15)
BUN: 15 mg/dL (ref 6–20)
CO2: 24 mmol/L (ref 22–32)
Calcium: 8.8 mg/dL — ABNORMAL LOW (ref 8.9–10.3)
Chloride: 101 mmol/L (ref 98–111)
Creatinine, Ser: 0.88 mg/dL (ref 0.61–1.24)
GFR, Estimated: >60 mL/min (ref >60)
Glucose, Bld: 255 mg/dL — ABNORMAL HIGH (ref 70–99)
Potassium: 3.8 mmol/L (ref 3.5–5.1)
Sodium: 135 mmol/L (ref 135–145)

## 2024-02-18 LAB — CBC
HCT: 36.4 % — ABNORMAL LOW (ref 39.0–52.0)
Hemoglobin: 12.3 g/dL — ABNORMAL LOW (ref 13.0–17.0)
MCH: 27.2 pg (ref 26.0–34.0)
MCHC: 33.8 g/dL (ref 30.0–36.0)
MCV: 80.4 fL (ref 80.0–100.0)
Platelets: 392 10*3/uL (ref 150–400)
RBC: 4.53 MIL/uL (ref 4.22–5.81)
RDW: 13.1 % (ref 11.5–15.5)
WBC: 10.1 10*3/uL (ref 4.0–10.5)
nRBC: 0 % (ref 0.0–0.2)

## 2024-02-18 MED ORDER — CEFAZOLIN SODIUM-DEXTROSE 2-4 GM/100ML-% IV SOLN
2.0000 g | Freq: Three times a day (TID) | INTRAVENOUS | Status: DC
  Administered 2024-02-18 – 2024-02-20 (×5): 2 g via INTRAVENOUS
  Administered 2024-02-20: 100 mL via INTRAVENOUS
  Administered 2024-02-20 – 2024-02-23 (×9): 2 g via INTRAVENOUS
  Filled 2024-02-18 (×15): qty 100

## 2024-02-18 NOTE — Progress Notes (Signed)
 PROGRESS NOTE    CAIDON FOTI  [name]:[id number] DOB: 01/26/1981 DOA: 02/13/2024 PCP: Rema Fendt, NP    Brief Narrative:   Ian Hughes is a 43 y.o. male with past medical history significant for HTN, HLD, DM2, obesity who presented to [facility] ED on 3/26 with worsening wound to right lower extremity with increased swelling, and drainage. Patient reports scraping his leg on a pallet at work roughly 3 days prior to ED presentation. In the ED, temperature 100.7 F, otherwise vital stable.  Labs with WBC 14.5. X-ray right tibia/fibula with diffuse soft tissue swelling of the lower extremity, no definite radiopaque foreign body identified.  Patient was started on IV vancomycin and cefepime.  TRH consulted for admission.  Patient transferred to [facility] for further management   Assessment & Plan:   Right lower extremity wound/cellulitis with abscess/phlegmon Left lower extremity wound Currently afebrile, with resolved leukocytosis X-ray right tibia/fibula with diffuse soft tissue swelling CT right tibia/fibula showed superficial soft tissue wound/defect lateral aspect proximal mid calf with 5.3 x 2.8 x 3.6 cm intramuscular phlegmon versus developing abscess soleus muscle Orthopedics was consulted; recommended IR aspiration which was performed on 02/16/2024, orthopedics, Dr. Lajoyce Corners recommended potential surgical debridements on 02/20/2024 Wound aspiration culture: Abundant Staph aureus, further identification/susceptibilities pending S/p Vancomycin--> continue IV Ancef Daily wound care Daily CBC  Type 2 diabetes mellitus, with hyperglycemia, uncontrolled Hemoglobin A1c 12.8 on 01/08/2024 SSI, Semglee, Accu-Cheks, hypoglycemic protocol Home regimen includes insulin glargine 13 units subcutaneously daily, metformin 1000 mg p.o. twice daily  Essential hypertension Continue amlodipine, Losartan and HCTZ   Hyperlipidemia Atorvastatin 10 mg p.o. daily  Obesity, class  II Body mass index is 35.73 kg/m.   DVT prophylaxis: enoxaparin (LOVENOX) injection 40 mg Start: 02/14/24 1800    Code Status: Full Code Family Communication: None at bedside Disposition Plan:  Level of care: Med-Surg Status is: Inpatient Remains inpatient appropriate because: IV antibiotics; pending wound culture results    Consultants:  Orthopedics Interventional radiology  Procedures:  IR aspiration of wound  Antimicrobials:  IV Ancef   Subjective: Patient denies any new complaints  Objective: Vitals:   02/17/24 2043 02/18/24 0204 02/18/24 0445 02/18/24 0735  BP: (!) 159/76 134/87 (!) 142/85 138/84  Pulse: 95 87 78 86  Resp: 17  17 18   Temp: 99.6 F (37.6 C) 99.3 F (37.4 C) 98.6 F (37 C) 98.1 F (36.7 C)  TempSrc: Oral Oral Oral   SpO2: 97% 98% 99% 100%  Weight:      Height:        Intake/Output Summary (Last 24 hours) at 02/18/2024 1636 Last data filed at 02/18/2024 0900 Gross per 24 hour  Intake 1257.75 ml  Output --  Net 1257.75 ml   Filed Weights   02/13/24 2302  Weight: 112.9 kg    Examination:  Physical Exam: General: NAD  Cardiovascular: S1, S2 present Respiratory: CTAB Abdomen: Soft, nontender, nondistended, bowel sounds present Musculoskeletal: No bilateral pedal edema noted, bilateral lower extremity wounds noted Skin: As noted below Psychiatry: Normal mood              Data Reviewed: I have personally reviewed following labs and imaging studies  CBC: Recent Labs  Lab 02/13/24 2313 02/14/24 0418 02/15/24 0454 02/16/24 0737 02/17/24 0900 02/18/24 0747  WBC 14.5* 14.3* 16.0* 15.4* 12.3* 10.1  NEUTROABS 11.4*  --   --   --   --   --   HGB 12.6* 12.4* 11.8* 11.4*  12.9* 12.3*  HCT 36.6* 36.4* 34.9* 34.5* 40.3 36.4*  MCV 79.7* 79.8* 81.9 83.7 84.8 80.4  PLT 388 381 376 365 383 392   Basic Metabolic Panel: Recent Labs  Lab 02/14/24 0418 02/15/24 0454 02/16/24 0737 02/17/24 0900 02/18/24 0747  NA 132* 135  135 134* 135  K 3.3* 3.5 3.5 3.8 3.8  CL 98 102 101 99 101  CO2 27 23 22 24 24   GLUCOSE 290* 207* 189* 223* 255*  BUN 19 11 11 14 15   CREATININE 1.11 0.78 0.87 0.84 0.88  CALCIUM 8.8* 8.4* 8.6* 8.8* 8.8*  MG  --   --  2.0  --   --    GFR: Estimated Creatinine Clearance: 137.7 mL/min (by C-G formula based on SCr of 0.88 mg/dL). Liver Function Tests: Recent Labs  Lab 02/14/24 0418  AST 10*  ALT 19  ALKPHOS 91  BILITOT 0.4  PROT 6.3*  ALBUMIN 3.3*   No results for input(s): "LIPASE", "AMYLASE" in the last 168 hours. No results for input(s): "AMMONIA" in the last 168 hours. Coagulation Profile: No results for input(s): "INR", "PROTIME" in the last 168 hours. Cardiac Enzymes: No results for input(s): "CKTOTAL", "CKMB", "CKMBINDEX", "TROPONINI" in the last 168 hours. BNP (last 3 results) No results for input(s): "PROBNP" in the last 8760 hours. HbA1C: No results for input(s): "HGBA1C" in the last 72 hours. CBG: Recent Labs  Lab 02/17/24 2140 02/18/24 0210 02/18/24 0611 02/18/24 1200 02/18/24 1605  GLUCAP 200* 204* 258* 185* 209*   Lipid Profile: No results for input(s): "CHOL", "HDL", "LDLCALC", "TRIG", "CHOLHDL", "LDLDIRECT" in the last 72 hours. Thyroid Function Tests: No results for input(s): "TSH", "T4TOTAL", "FREET4", "T3FREE", "THYROIDAB" in the last 72 hours. Anemia Panel: No results for input(s): "VITAMINB12", "FOLATE", "FERRITIN", "TIBC", "IRON", "RETICCTPCT" in the last 72 hours. Sepsis Labs: Recent Labs  Lab 02/13/24 2313  LATICACIDVEN 0.9    Recent Results (from the past 240 hours)  Culture, blood (Routine X 2) w Reflex to ID Panel     Status: None (Preliminary result)   Collection Time: 02/14/24  1:00 AM   Specimen: BLOOD LEFT ARM  Result Value Ref Range Status   Specimen Description   Final    BLOOD LEFT ARM Performed at [facility], [address], [place], [place] [address]    Special Requests   Final    BOTTLES DRAWN  AEROBIC AND ANAEROBIC Blood Culture adequate volume Performed at [facility], [address] [place], [place] [address]    Culture   Final    NO GROWTH 4 DAYS Performed at [facility], [address], [place], [place] [address]    Report Status PENDING  Incomplete  Culture, blood (Routine X 2) w Reflex to ID Panel     Status: None (Preliminary result)   Collection Time: 02/14/24  1:15 AM   Specimen: BLOOD RIGHT ARM  Result Value Ref Range Status   Specimen Description   Final    BLOOD RIGHT ARM Performed at [facility], [address] [place], [place] [address]    Special Requests   Final    BOTTLES DRAWN AEROBIC AND ANAEROBIC Blood Culture adequate volume Performed at [facility], [address] [place], [place] [address]    Culture   Final    NO GROWTH 4 DAYS Performed at [facility], [address] [place], [place] [address]    Report Status PENDING  Incomplete  Aerobic/Anaerobic Culture w  Gram Stain (surgical/deep wound)     Status: None (Preliminary result)   Collection Time: 02/16/24  1:29 PM   Specimen: Abscess  Result Value Ref Range Status   Specimen Description   Final    ABSCESS Performed at [facility], [address], [place], [place] [address]    Special Requests   Final    NONE Performed at [facility], [address] [place], [place] [address]    Gram Stain   Final    ABUNDANT WBC PRESENT, PREDOMINANTLY PMN MODERATE GRAM POSITIVE COCCI Performed at [facility], [address], [place], [place] [address]    Culture   Final    ABUNDANT STAPHYLOCOCCUS AUREUS NO ANAEROBES ISOLATED; CULTURE IN PROGRESS FOR 5 DAYS    Report Status PENDING  Incomplete   Organism ID, Bacteria STAPHYLOCOCCUS AUREUS  Final      Susceptibility   Staphylococcus aureus - MIC*    CIPROFLOXACIN <=0.5 SENSITIVE Sensitive     ERYTHROMYCIN >=8 RESISTANT  Resistant     GENTAMICIN <=0.5 SENSITIVE Sensitive     OXACILLIN 0.5 SENSITIVE Sensitive     TETRACYCLINE <=1 SENSITIVE Sensitive     VANCOMYCIN 1 SENSITIVE Sensitive     TRIMETH/SULFA <=10 SENSITIVE Sensitive     CLINDAMYCIN RESISTANT Resistant     RIFAMPIN <=0.5 SENSITIVE Sensitive     Inducible Clindamycin POSITIVE Resistant     LINEZOLID 2 SENSITIVE Sensitive     * ABUNDANT STAPHYLOCOCCUS AUREUS         Radiology Studies: No results found.       Scheduled Meds:  amLODipine  5 mg Oral Daily   ascorbic acid  500 mg Oral BID   atorvastatin  10 mg Oral QHS   enoxaparin (LOVENOX) injection  40 mg Subcutaneous Q24H   gabapentin  300 mg Oral QHS   hydrochlorothiazide  12.5 mg Oral Daily   insulin aspart  0-15 Units Subcutaneous TID WC   insulin aspart  0-5 Units Subcutaneous QHS   insulin glargine-yfgn  25 Units Subcutaneous QHS   leptospermum manuka honey  1 Application Topical Daily   losartan  50 mg Oral QHS   Ensure Max Protein  11 oz Oral Daily   zinc sulfate (50mg  elemental zinc)  220 mg Oral Daily   Continuous Infusions:   ceFAZolin (ANCEF) IV       LOS: 4 days     Briant Cedar, MD [facility] Available via Epic secure chat 7am-7pm After these hours, please refer to coverage provider listed on [email or web address] 02/18/2024, 4:36 PM

## 2024-02-18 NOTE — Plan of Care (Signed)

## 2024-02-18 NOTE — Consult Note (Signed)
 ORTHOPAEDIC CONSULTATION  REQUESTING PHYSICIAN: Briant Cedar, MD  Chief Complaint: Purulent drainage ulceration lateral right calf.  HPI: Ian Hughes is a 43 y.o. male who presents with lateral right calf proximal ulcer.  Patient states this started after blunt trauma at work where his lateral calf was impacted from a wooden pallet.  Patient states he had progressive increasing pain swelling and ulceration. Past Medical History:  Diagnosis Date   Cataract 2011   surgery on R eye   Diabetes mellitus without complication (HCC)    High cholesterol    Obesity    Past Surgical History:  Procedure Laterality Date   EYE SURGERY Left 2011   cataract   EYE SURGERY Right 2012   Social History   Socioeconomic History   Marital status: Divorced    Spouse name: Not on file   Number of children: 1   Years of education: 12+   Highest education level: 12th grade  Occupational History    Employer: [employer]  Tobacco Use   Smoking status: Never    Passive exposure: Never   Smokeless tobacco: Never  Vaping Use   Vaping status: Never Used  Substance and Sexual Activity   Alcohol use: Yes    Comment: 1 or 2 every 3 months    Drug use: No   Sexual activity: Not on file  Other Topics Concern   Not on file  Social History Narrative   Lives at home with wife and stepson.   Caffeine use: none   Has one child.   Social Drivers of Corporate investment banker Strain: Low Risk  (03/12/2023)   Overall Financial Resource Strain (CARDIA)    Difficulty of Paying Living Expenses: Not very hard  Food Insecurity: No Food Insecurity (02/17/2024)   Hunger Vital Sign    Worried About Running Out of Food in the Last Year: Never true    Ran Out of Food in the Last Year: Never true  Transportation Needs: No Transportation Needs (02/17/2024)   PRAPARE - Administrator, Civil Service (Medical): No    Lack of Transportation (Non-Medical): No  Physical Activity: Sufficiently  Active (03/12/2023)   Exercise Vital Sign    Days of Exercise per Week: 7 days    Minutes of Exercise per Session: 30 min  Stress: No Stress Concern Present (03/12/2023)   [employer] of Occupational Health - Occupational Stress Questionnaire    Feeling of Stress : Only a little  Social Connections: Patient Declined (02/17/2024)   Social Connection and Isolation Panel [NHANES]    Frequency of Communication with Friends and Family: Patient declined    Frequency of Social Gatherings with Friends and Family: Patient declined    Attends Religious Services: Patient declined    Database administrator or Organizations: Patient declined    Attends Engineer, structural: Patient declined    Marital Status: Patient declined   Family History  Problem Relation Age of Onset   Diabetes Mother    Cancer Father        leukemia    Obesity Other    - negative except otherwise stated in the family history section No Known Allergies Prior to Admission medications   Medication Sig Start Date End Date Taking? Authorizing Provider  acetaminophen (TYLENOL) 500 MG tablet Take 500 mg by mouth every 6 (six) hours as needed.   Yes [provider]  amLODipine (NORVASC) 5 MG tablet Take 5 mg by  mouth daily. 09/24/23  Yes [provider]  atorvastatin (LIPITOR) 10 MG tablet Take 1 tablet (10 mg total) by mouth daily. 09/19/23  Yes Zonia Kief, Amy J, NP  Continuous Glucose Receiver (FREESTYLE LIBRE 2 READER) DEVI Use to check blood sugar continuously throughout the day. E11.42 03/16/23  Yes Hoy Register, MD  Continuous Glucose Sensor (FREESTYLE LIBRE 2 SENSOR) MISC Use to check blood sugar continuously throughout the day. Change sensors once every 14 days. E11.42 03/16/23  Yes Hoy Register, MD  gabapentin (NEURONTIN) 300 MG capsule Take 1 capsule (300 mg total) by mouth at bedtime. 01/08/24  Yes Zonia Kief, Amy J, NP  hydrochlorothiazide (HYDRODIURIL) 12.5 MG tablet Take 1 tablet (12.5 mg  total) by mouth daily. 01/22/24 04/21/24 Yes Zonia Kief, Amy J, NP  Insulin Glargine (BASAGLAR KWIKPEN) 100 UNIT/ML Inject 13 Units into the skin at bedtime. 01/22/24  Yes Zonia Kief, Amy J, NP  losartan (COZAAR) 50 MG tablet Take 1 tablet (50 mg total) by mouth daily. 01/22/24 04/21/24 Yes Zonia Kief, Amy J, NP  metFORMIN (GLUCOPHAGE-XR) 500 MG 24 hr tablet Take 2 tablets (1,000 mg total) by mouth 2 (two) times daily with a meal. 01/22/24 04/21/24 Yes Zonia Kief, Amy J, NP  Insulin Pen Needle 32G X 4 MM MISC 1 each by Other route daily. Use to inject insulin once daily. 01/10/24   Rema Fendt, NP  methocarbamol (ROBAXIN) 500 MG tablet Take 1 tablet (500 mg total) by mouth 2 (two) times daily as needed for muscle spasms. Patient not taking: Reported on 01/22/2024 10/24/23   Gustavus Bryant, FNP  TRUEplus Lancets 28G MISC Use to check blood sugar 2 times a day 03/16/23   Rema Fendt, NP  bromocriptine (PARLODEL) 2.5 MG tablet 1/4 tab daily Patient not taking: Reported on 03/12/2019 01/11/18 10/06/19  Romero Belling, MD  glimepiride (AMARYL) 1 MG tablet Take 0.5 tablets (0.5 mg total) by mouth daily with breakfast. Patient not taking: Reported on 03/12/2019 01/11/18 10/06/19  Romero Belling, MD   [place] IMAGE GUIDED DRAINAGE BY PERCUTANEOUS CATHETER Result Date: 02/16/2024 INDICATION: Soft tissue infection with a wound on the lateral aspect of the right calf. CT raised concern for an intramuscular abscess. EXAM: ULTRASOUND-GUIDED ASPIRATION OF RIGHT CALF INTRAMUSCULAR ABSCESS MEDICATIONS: % lidocaine for local anesthetic ANESTHESIA/SEDATION: None COMPLICATIONS: None immediate. PROCEDURE: Informed written consent was obtained from the patient after a thorough discussion of the procedural risks, benefits and alternatives. All questions were addressed. A timeout was performed prior to the initiation of the procedure. Ultrasound was used to evaluate the lateral aspect of the right calf. Edematous muscle tissue was identified adjacent to  the right fibula. This corresponded with the area of abnormality on recent CT. The overlying skin and wound were prepped with Betadine. Sterile field was created. Skin was anesthetized using 1% lidocaine. Using ultrasound guidance, a 20 gauge spinal needle was directed into the edematous muscle. Multiple hypoechoic areas were aspirated using ultrasound guidance. Total of 3 mL of bloody purulent fluid was aspirated. Bandage placed over the puncture site. FINDINGS: Superficial wound on the lateral aspect of the right calf. Beneath and just posterior to the wound, there is edematous muscular tissue corresponding with the area of concern. Needle was directed into multiple hypoechoic areas and small amount of thick purulent fluid was aspirated. IMPRESSION: Ultrasound-guided aspiration of purulent fluid from a complex intramuscular abscess. Electronically Signed   By: Richarda Overlie M.D.   On: 02/16/2024 14:39   - pertinent xrays, CT, MRI studies were reviewed and  independently interpreted  Positive ROS: All other systems have been reviewed and were otherwise negative with the exception of those mentioned in the HPI and as above.  Physical Exam: General: Alert, no acute distress Psychiatric: Patient is competent for consent with normal mood and affect Lymphatic: No axillary or cervical lymphadenopathy Cardiovascular: No pedal edema Respiratory: No cyanosis, no use of accessory musculature GI: No organomegaly, abdomen is soft and non-tender    Images:  @ENCIMAGES @  Labs:  Lab Results  Component Value Date   HGBA1C 14.1 (A) 01/22/2024   HGBA1C 12.8 (H) 01/08/2024   HGBA1C 12.7 (A) 09/19/2023   REPTSTATUS PENDING 02/16/2024   GRAMSTAIN  02/16/2024    ABUNDANT WBC PRESENT, PREDOMINANTLY PMN MODERATE GRAM POSITIVE COCCI    CULT  02/16/2024    ABUNDANT STAPHYLOCOCCUS AUREUS SUSCEPTIBILITIES TO FOLLOW Performed at [facility], [address], [place], [place] [address]     Lab Results   Component Value Date   ALBUMIN 3.3 (L) 02/14/2024   ALBUMIN 4.3 06/21/2020   ALBUMIN 4.3 12/12/2019        Latest Ref Rng & Units 02/17/2024    9:00 AM 02/16/2024    7:37 AM 02/15/2024    4:54 AM  CBC EXTENDED  WBC 4.0 - 10.5 K/uL 12.3  15.4  16.0   RBC 4.22 - 5.81 MIL/uL 4.75  4.12  4.26   Hemoglobin 13.0 - 17.0 g/dL 60.4  54.0  98.1   HCT 39.0 - 52.0 % 40.3  34.5  34.9   Platelets 150 - 400 K/uL 383  365  376     Neurologic: Patient does not have protective sensation bilateral lower extremities.   MUSCULOSKELETAL:   Skin: Examination the patient has a ulcer over the lateral right calf there has been some purulent drainage.  Review of the CT scan shows a large fluid collection in the lateral compartment proximally.  There is overlying necrotic skin.  Aspirate cultures from interventional radiology shows Staph aureus.  Patient has a palpable dorsalis pedis pulse.   Most recent white cell count is 12.3 decreased from 16.  Hemoglobin 12.9.  Hemoglobin A1c 14.1.  Assessment: Assessment: Abscess lateral compartment proximal right calf.  Plan: Plan: Will plan for initial excisional debridement of the lateral compartment on Wednesday with follow-up debridement on Friday.  Thank you for the consult and the opportunity to see Mr. Ac Colan, MD [facility] [phone] 8:00 AM

## 2024-02-18 NOTE — H&P (View-Only) (Signed)
 ORTHOPAEDIC CONSULTATION  REQUESTING PHYSICIAN: Briant Cedar, MD  Chief Complaint: Purulent drainage ulceration lateral right calf.  HPI: Ian Hughes is a 43 y.o. male who presents with lateral right calf proximal ulcer.  Patient states this started after blunt trauma at work where his lateral calf was impacted from a wooden pallet.  Patient states he had progressive increasing pain swelling and ulceration. Past Medical History:  Diagnosis Date   Cataract 2011   surgery on R eye   Diabetes mellitus without complication (HCC)    High cholesterol    Obesity    Past Surgical History:  Procedure Laterality Date   EYE SURGERY Left 2011   cataract   EYE SURGERY Right 2012   Social History   Socioeconomic History   Marital status: Divorced    Spouse name: Not on file   Number of children: 1   Years of education: 12+   Highest education level: 12th grade  Occupational History    Employer: [employer]  Tobacco Use   Smoking status: Never    Passive exposure: Never   Smokeless tobacco: Never  Vaping Use   Vaping status: Never Used  Substance and Sexual Activity   Alcohol use: Yes    Comment: 1 or 2 every 3 months    Drug use: No   Sexual activity: Not on file  Other Topics Concern   Not on file  Social History Narrative   Lives at home with wife and stepson.   Caffeine use: none   Has one child.   Social Drivers of Corporate investment banker Strain: Low Risk  (03/12/2023)   Overall Financial Resource Strain (CARDIA)    Difficulty of Paying Living Expenses: Not very hard  Food Insecurity: No Food Insecurity (02/17/2024)   Hunger Vital Sign    Worried About Running Out of Food in the Last Year: Never true    Ran Out of Food in the Last Year: Never true  Transportation Needs: No Transportation Needs (02/17/2024)   PRAPARE - Administrator, Civil Service (Medical): No    Lack of Transportation (Non-Medical): No  Physical Activity: Sufficiently  Active (03/12/2023)   Exercise Vital Sign    Days of Exercise per Week: 7 days    Minutes of Exercise per Session: 30 min  Stress: No Stress Concern Present (03/12/2023)   [employer] of Occupational Health - Occupational Stress Questionnaire    Feeling of Stress : Only a little  Social Connections: Patient Declined (02/17/2024)   Social Connection and Isolation Panel [NHANES]    Frequency of Communication with Friends and Family: Patient declined    Frequency of Social Gatherings with Friends and Family: Patient declined    Attends Religious Services: Patient declined    Database administrator or Organizations: Patient declined    Attends Engineer, structural: Patient declined    Marital Status: Patient declined   Family History  Problem Relation Age of Onset   Diabetes Mother    Cancer Father        leukemia    Obesity Other    - negative except otherwise stated in the family history section No Known Allergies Prior to Admission medications   Medication Sig Start Date End Date Taking? Authorizing Provider  acetaminophen (TYLENOL) 500 MG tablet Take 500 mg by mouth every 6 (six) hours as needed.   Yes [provider]  amLODipine (NORVASC) 5 MG tablet Take 5 mg by  mouth daily. 09/24/23  Yes [provider]  atorvastatin (LIPITOR) 10 MG tablet Take 1 tablet (10 mg total) by mouth daily. 09/19/23  Yes Zonia Kief, Amy J, NP  Continuous Glucose Receiver (FREESTYLE LIBRE 2 READER) DEVI Use to check blood sugar continuously throughout the day. E11.42 03/16/23  Yes Hoy Register, MD  Continuous Glucose Sensor (FREESTYLE LIBRE 2 SENSOR) MISC Use to check blood sugar continuously throughout the day. Change sensors once every 14 days. E11.42 03/16/23  Yes Hoy Register, MD  gabapentin (NEURONTIN) 300 MG capsule Take 1 capsule (300 mg total) by mouth at bedtime. 01/08/24  Yes Zonia Kief, Amy J, NP  hydrochlorothiazide (HYDRODIURIL) 12.5 MG tablet Take 1 tablet (12.5 mg  total) by mouth daily. 01/22/24 04/21/24 Yes Zonia Kief, Amy J, NP  Insulin Glargine (BASAGLAR KWIKPEN) 100 UNIT/ML Inject 13 Units into the skin at bedtime. 01/22/24  Yes Zonia Kief, Amy J, NP  losartan (COZAAR) 50 MG tablet Take 1 tablet (50 mg total) by mouth daily. 01/22/24 04/21/24 Yes Zonia Kief, Amy J, NP  metFORMIN (GLUCOPHAGE-XR) 500 MG 24 hr tablet Take 2 tablets (1,000 mg total) by mouth 2 (two) times daily with a meal. 01/22/24 04/21/24 Yes Zonia Kief, Amy J, NP  Insulin Pen Needle 32G X 4 MM MISC 1 each by Other route daily. Use to inject insulin once daily. 01/10/24   Rema Fendt, NP  methocarbamol (ROBAXIN) 500 MG tablet Take 1 tablet (500 mg total) by mouth 2 (two) times daily as needed for muscle spasms. Patient not taking: Reported on 01/22/2024 10/24/23   Gustavus Bryant, FNP  TRUEplus Lancets 28G MISC Use to check blood sugar 2 times a day 03/16/23   Rema Fendt, NP  bromocriptine (PARLODEL) 2.5 MG tablet 1/4 tab daily Patient not taking: Reported on 03/12/2019 01/11/18 10/06/19  Romero Belling, MD  glimepiride (AMARYL) 1 MG tablet Take 0.5 tablets (0.5 mg total) by mouth daily with breakfast. Patient not taking: Reported on 03/12/2019 01/11/18 10/06/19  Romero Belling, MD   [place] IMAGE GUIDED DRAINAGE BY PERCUTANEOUS CATHETER Result Date: 02/16/2024 INDICATION: Soft tissue infection with a wound on the lateral aspect of the right calf. CT raised concern for an intramuscular abscess. EXAM: ULTRASOUND-GUIDED ASPIRATION OF RIGHT CALF INTRAMUSCULAR ABSCESS MEDICATIONS: % lidocaine for local anesthetic ANESTHESIA/SEDATION: None COMPLICATIONS: None immediate. PROCEDURE: Informed written consent was obtained from the patient after a thorough discussion of the procedural risks, benefits and alternatives. All questions were addressed. A timeout was performed prior to the initiation of the procedure. Ultrasound was used to evaluate the lateral aspect of the right calf. Edematous muscle tissue was identified adjacent to  the right fibula. This corresponded with the area of abnormality on recent CT. The overlying skin and wound were prepped with Betadine. Sterile field was created. Skin was anesthetized using 1% lidocaine. Using ultrasound guidance, a 20 gauge spinal needle was directed into the edematous muscle. Multiple hypoechoic areas were aspirated using ultrasound guidance. Total of 3 mL of bloody purulent fluid was aspirated. Bandage placed over the puncture site. FINDINGS: Superficial wound on the lateral aspect of the right calf. Beneath and just posterior to the wound, there is edematous muscular tissue corresponding with the area of concern. Needle was directed into multiple hypoechoic areas and small amount of thick purulent fluid was aspirated. IMPRESSION: Ultrasound-guided aspiration of purulent fluid from a complex intramuscular abscess. Electronically Signed   By: Richarda Overlie M.D.   On: 02/16/2024 14:39   - pertinent xrays, CT, MRI studies were reviewed and  independently interpreted  Positive ROS: All other systems have been reviewed and were otherwise negative with the exception of those mentioned in the HPI and as above.  Physical Exam: General: Alert, no acute distress Psychiatric: Patient is competent for consent with normal mood and affect Lymphatic: No axillary or cervical lymphadenopathy Cardiovascular: No pedal edema Respiratory: No cyanosis, no use of accessory musculature GI: No organomegaly, abdomen is soft and non-tender    Images:  @ENCIMAGES @  Labs:  Lab Results  Component Value Date   HGBA1C 14.1 (A) 01/22/2024   HGBA1C 12.8 (H) 01/08/2024   HGBA1C 12.7 (A) 09/19/2023   REPTSTATUS PENDING 02/16/2024   GRAMSTAIN  02/16/2024    ABUNDANT WBC PRESENT, PREDOMINANTLY PMN MODERATE GRAM POSITIVE COCCI    CULT  02/16/2024    ABUNDANT STAPHYLOCOCCUS AUREUS SUSCEPTIBILITIES TO FOLLOW Performed at [facility], [address], [place], [place] [address]     Lab Results   Component Value Date   ALBUMIN 3.3 (L) 02/14/2024   ALBUMIN 4.3 06/21/2020   ALBUMIN 4.3 12/12/2019        Latest Ref Rng & Units 02/17/2024    9:00 AM 02/16/2024    7:37 AM 02/15/2024    4:54 AM  CBC EXTENDED  WBC 4.0 - 10.5 K/uL 12.3  15.4  16.0   RBC 4.22 - 5.81 MIL/uL 4.75  4.12  4.26   Hemoglobin 13.0 - 17.0 g/dL 60.4  54.0  98.1   HCT 39.0 - 52.0 % 40.3  34.5  34.9   Platelets 150 - 400 K/uL 383  365  376     Neurologic: Patient does not have protective sensation bilateral lower extremities.   MUSCULOSKELETAL:   Skin: Examination the patient has a ulcer over the lateral right calf there has been some purulent drainage.  Review of the CT scan shows a large fluid collection in the lateral compartment proximally.  There is overlying necrotic skin.  Aspirate cultures from interventional radiology shows Staph aureus.  Patient has a palpable dorsalis pedis pulse.   Most recent white cell count is 12.3 decreased from 16.  Hemoglobin 12.9.  Hemoglobin A1c 14.1.  Assessment: Assessment: Abscess lateral compartment proximal right calf.  Plan: Plan: Will plan for initial excisional debridement of the lateral compartment on Wednesday with follow-up debridement on Friday.  Thank you for the consult and the opportunity to see Mr. Ac Colan, MD [facility] [phone] 8:00 AM

## 2024-02-18 NOTE — Plan of Care (Signed)

## 2024-02-18 NOTE — TOC Progression Note (Signed)
 Transition of Care [facility]) - Progression Note    Patient Details  Name: Ian Hughes MRN: [id number] Date of Birth: 05/03/1981  Transition of Care [facility]) CM/SW Contact  Ronny Bacon, RN Phone Number: 02/18/2024, 1:40 PM  Clinical Narrative:   Patient to have surgery on Wednesday and again on Friday.    Expected Discharge Plan: Home/Self Care    Expected Discharge Plan and Services                                               Social Determinants of Health (SDOH) Interventions SDOH Screenings   Food Insecurity: No Food Insecurity (02/17/2024)  Housing: Unknown (02/17/2024)  Transportation Needs: No Transportation Needs (02/17/2024)  Utilities: Not At Risk (02/17/2024)  Alcohol Screen: Low Risk  (03/12/2023)  Depression (PHQ2-9): Low Risk  (01/08/2024)  Financial Resource Strain: Low Risk  (03/12/2023)  Physical Activity: Sufficiently Active (03/12/2023)  Social Connections: Patient Declined (02/17/2024)  Stress: No Stress Concern Present (03/12/2023)  Tobacco Use: Low Risk  (02/17/2024)    Readmission Risk Interventions    02/15/2024   10:55 AM  Readmission Risk Prevention Plan  Post Dischage Appt Complete  Medication Screening Complete  Transportation Screening Complete

## 2024-02-19 DIAGNOSIS — L03115 Cellulitis of right lower limb: Secondary | ICD-10-CM | POA: Diagnosis not present

## 2024-02-19 LAB — BASIC METABOLIC PANEL WITH GFR
Anion gap: 10 (ref 5–15)
BUN: 14 mg/dL (ref 6–20)
CO2: 25 mmol/L (ref 22–32)
Calcium: 8.9 mg/dL (ref 8.9–10.3)
Chloride: 101 mmol/L (ref 98–111)
Creatinine, Ser: 0.85 mg/dL (ref 0.61–1.24)
GFR, Estimated: >60 mL/min (ref >60)
Glucose, Bld: 207 mg/dL — ABNORMAL HIGH (ref 70–99)
Potassium: 3.9 mmol/L (ref 3.5–5.1)
Sodium: 136 mmol/L (ref 135–145)

## 2024-02-19 LAB — GLUCOSE, CAPILLARY
Glucose-Capillary: 192 mg/dL — ABNORMAL HIGH (ref 70–99)
Glucose-Capillary: 231 mg/dL — ABNORMAL HIGH (ref 70–99)
Glucose-Capillary: 167 mg/dL — ABNORMAL HIGH (ref 70–99)
Glucose-Capillary: 182 mg/dL — ABNORMAL HIGH (ref 70–99)
Glucose-Capillary: 258 mg/dL — ABNORMAL HIGH (ref 70–99)

## 2024-02-19 LAB — CULTURE, BLOOD (ROUTINE X 2)
Culture: NO GROWTH
Culture: NO GROWTH
Special Requests: ADEQUATE
Special Requests: ADEQUATE

## 2024-02-19 LAB — CBC
HCT: 34.4 % — ABNORMAL LOW (ref 39.0–52.0)
Hemoglobin: 11.7 g/dL — ABNORMAL LOW (ref 13.0–17.0)
MCH: 27.2 pg (ref 26.0–34.0)
MCHC: 34 g/dL (ref 30.0–36.0)
MCV: 80 fL (ref 80.0–100.0)
Platelets: 399 10*3/uL (ref 150–400)
RBC: 4.3 MIL/uL (ref 4.22–5.81)
RDW: 13.1 % (ref 11.5–15.5)
WBC: 9.4 10*3/uL (ref 4.0–10.5)
nRBC: 0 % (ref 0.0–0.2)

## 2024-02-19 NOTE — Plan of Care (Signed)

## 2024-02-19 NOTE — Progress Notes (Signed)
 PROGRESS NOTE    Ian Hughes  [name]:[id number] DOB: Nov 01, 1981 DOA: 02/13/2024 PCP: Rema Fendt, NP    Brief Narrative:   Ian Hughes is a 43 y.o. male with past medical history significant for HTN, HLD, DM2, obesity who presented to [facility] ED on 3/26 with worsening wound to right lower extremity with increased swelling, and drainage. Patient reports scraping his leg on a pallet at work roughly 3 days prior to ED presentation. In the ED, temperature 100.7 F, otherwise vital stable.  Labs with WBC 14.5. X-ray right tibia/fibula with diffuse soft tissue swelling of the lower extremity, no definite radiopaque foreign body identified.  Patient was started on IV vancomycin and cefepime.  TRH consulted for admission.  Patient transferred to [facility] for further management   Assessment & Plan:   Right lower extremity wound/cellulitis with abscess/phlegmon Left lower extremity wound Currently afebrile, with resolved leukocytosis X-ray right tibia/fibula with diffuse soft tissue swelling CT right tibia/fibula showed superficial soft tissue wound/defect lateral aspect proximal mid calf with 5.3 x 2.8 x 3.6 cm intramuscular phlegmon versus developing abscess soleus muscle Orthopedics was consulted; recommended IR aspiration which was performed on 02/16/2024, orthopedics, Dr. Lajoyce Corners recommended potential surgical debridements on 02/20/2024 Wound aspiration culture: MSSA S/p Vancomycin--> continue IV Ancef Daily wound care Daily CBC  Type 2 diabetes mellitus, with hyperglycemia, uncontrolled Hemoglobin A1c 12.8 on 01/08/2024 SSI, Semglee, Accu-Cheks, hypoglycemic protocol Home regimen includes insulin glargine 13 units subcutaneously daily, metformin 1000 mg p.o. twice daily  Essential hypertension Continue amlodipine, Losartan and HCTZ   Hyperlipidemia Atorvastatin 10 mg p.o. daily  Obesity, class II Body mass index is 35.73 kg/m.   DVT prophylaxis: enoxaparin  (LOVENOX) injection 40 mg Start: 02/14/24 1800    Code Status: Full Code Family Communication: None at bedside Disposition Plan:  Level of care: Med-Surg Status is: Inpatient Remains inpatient appropriate because: IV antibiotics; pending I&D    Consultants:  Orthopedics Interventional radiology  Procedures:  IR aspiration of wound  Antimicrobials:  IV Ancef   Subjective: No new complaints  Objective: Vitals:   02/18/24 2047 02/19/24 0554 02/19/24 0853 02/19/24 1722  BP: (!) 145/86 134/76 132/85 129/79  Pulse: 87 78 78 87  Resp:   17 17  Temp: 99.1 F (37.3 C) 97.9 F (36.6 C) 98.7 F (37.1 C) 98.8 F (37.1 C)  TempSrc:   Oral Oral  SpO2: 98% 98% 100% 100%  Weight:      Height:        Intake/Output Summary (Last 24 hours) at 02/19/2024 1740 Last data filed at 02/19/2024 0342 Gross per 24 hour  Intake 440 ml  Output 1 ml  Net 439 ml   Filed Weights   02/13/24 2302  Weight: 112.9 kg    Examination:  Physical Exam: General: NAD  Cardiovascular: S1, S2 present Respiratory: CTAB Abdomen: Soft, nontender, nondistended, bowel sounds present Musculoskeletal: No bilateral pedal edema noted, bilateral lower extremity wounds noted Skin: As noted below Psychiatry: Normal mood              Data Reviewed: I have personally reviewed following labs and imaging studies  CBC: Recent Labs  Lab 02/13/24 2313 02/14/24 0418 02/15/24 0454 02/16/24 0737 02/17/24 0900 02/18/24 0747 02/19/24 0616  WBC 14.5*   < > 16.0* 15.4* 12.3* 10.1 9.4  NEUTROABS 11.4*  --   --   --   --   --   --   HGB 12.6*   < >  11.8* 11.4* 12.9* 12.3* 11.7*  HCT 36.6*   < > 34.9* 34.5* 40.3 36.4* 34.4*  MCV 79.7*   < > 81.9 83.7 84.8 80.4 80.0  PLT 388   < > 376 365 383 392 399   < > = values in this interval not displayed.   Basic Metabolic Panel: Recent Labs  Lab 02/15/24 0454 02/16/24 0737 02/17/24 0900 02/18/24 0747 02/19/24 0616  NA 135 135 134* 135 136  K 3.5 3.5  3.8 3.8 3.9  CL 102 101 99 101 101  CO2 23 22 24 24 25   GLUCOSE 207* 189* 223* 255* 207*  BUN 11 11 14 15 14   CREATININE 0.78 0.87 0.84 0.88 0.85  CALCIUM 8.4* 8.6* 8.8* 8.8* 8.9  MG  --  2.0  --   --   --    GFR: Estimated Creatinine Clearance: 142.5 mL/min (by C-G formula based on SCr of 0.85 mg/dL). Liver Function Tests: Recent Labs  Lab 02/14/24 0418  AST 10*  ALT 19  ALKPHOS 91  BILITOT 0.4  PROT 6.3*  ALBUMIN 3.3*   No results for input(s): "LIPASE", "AMYLASE" in the last 168 hours. No results for input(s): "AMMONIA" in the last 168 hours. Coagulation Profile: No results for input(s): "INR", "PROTIME" in the last 168 hours. Cardiac Enzymes: No results for input(s): "CKTOTAL", "CKMB", "CKMBINDEX", "TROPONINI" in the last 168 hours. BNP (last 3 results) No results for input(s): "PROBNP" in the last 8760 hours. HbA1C: No results for input(s): "HGBA1C" in the last 72 hours. CBG: Recent Labs  Lab 02/18/24 1605 02/18/24 2200 02/19/24 0552 02/19/24 1221 02/19/24 1720  GLUCAP 209* 233* 192* 167* 182*   Lipid Profile: No results for input(s): "CHOL", "HDL", "LDLCALC", "TRIG", "CHOLHDL", "LDLDIRECT" in the last 72 hours. Thyroid Function Tests: No results for input(s): "TSH", "T4TOTAL", "FREET4", "T3FREE", "THYROIDAB" in the last 72 hours. Anemia Panel: No results for input(s): "VITAMINB12", "FOLATE", "FERRITIN", "TIBC", "IRON", "RETICCTPCT" in the last 72 hours. Sepsis Labs: Recent Labs  Lab 02/13/24 2313  LATICACIDVEN 0.9    Recent Results (from the past 240 hours)  Culture, blood (Routine X 2) w Reflex to ID Panel     Status: None   Collection Time: 02/14/24  1:00 AM   Specimen: BLOOD LEFT ARM  Result Value Ref Range Status   Specimen Description   Final    BLOOD LEFT ARM Performed at [facility], [address] [place], [place] [address]    Special Requests   Final    BOTTLES DRAWN AEROBIC AND ANAEROBIC Blood Culture adequate  volume Performed at [facility], [address] [place], [place] [address]    Culture   Final    NO GROWTH 5 DAYS Performed at [facility], [address] [place], [place] [address]    Report Status 02/19/2024 FINAL  Final  Culture, blood (Routine X 2) w Reflex to ID Panel     Status: None   Collection Time: 02/14/24  1:15 AM   Specimen: BLOOD RIGHT ARM  Result Value Ref Range Status   Specimen Description   Final    BLOOD RIGHT ARM Performed at [facility], [address] [place], [place] [address]    Special Requests   Final    BOTTLES DRAWN AEROBIC AND ANAEROBIC Blood Culture adequate volume Performed at [facility], [address], [place], [place] [address]    Culture   Final    NO GROWTH 5 DAYS Performed at  [facility], [address] [place], [place] [address]    Report Status 02/19/2024 FINAL  Final  Aerobic/Anaerobic Culture w Gram Stain (surgical/deep wound)     Status: None (Preliminary result)   Collection Time: 02/16/24  1:29 PM   Specimen: Abscess  Result Value Ref Range Status   Specimen Description   Final    ABSCESS Performed at [facility], [address] [place], [place] [address]    Special Requests   Final    NONE Performed at [facility], [address], [place], [place] [address]    Gram Stain   Final    ABUNDANT WBC PRESENT, PREDOMINANTLY PMN MODERATE GRAM POSITIVE COCCI Performed at [facility], [address] [place], [place] [address]    Culture   Final    ABUNDANT STAPHYLOCOCCUS AUREUS NO ANAEROBES ISOLATED; CULTURE IN PROGRESS FOR 5 DAYS    Report Status PENDING  Incomplete   Organism ID, Bacteria STAPHYLOCOCCUS AUREUS  Final      Susceptibility   Staphylococcus aureus - MIC*    CIPROFLOXACIN <=0.5 SENSITIVE Sensitive     ERYTHROMYCIN >=8 RESISTANT Resistant     GENTAMICIN <=0.5 SENSITIVE Sensitive      OXACILLIN 0.5 SENSITIVE Sensitive     TETRACYCLINE <=1 SENSITIVE Sensitive     VANCOMYCIN 1 SENSITIVE Sensitive     TRIMETH/SULFA <=10 SENSITIVE Sensitive     CLINDAMYCIN RESISTANT Resistant     RIFAMPIN <=0.5 SENSITIVE Sensitive     Inducible Clindamycin POSITIVE Resistant     LINEZOLID 2 SENSITIVE Sensitive     * ABUNDANT STAPHYLOCOCCUS AUREUS         Radiology Studies: No results found.       Scheduled Meds:  amLODipine  5 mg Oral Daily   ascorbic acid  500 mg Oral BID   atorvastatin  10 mg Oral QHS   enoxaparin (LOVENOX) injection  40 mg Subcutaneous Q24H   gabapentin  300 mg Oral QHS   hydrochlorothiazide  12.5 mg Oral Daily   insulin aspart  0-15 Units Subcutaneous TID WC   insulin aspart  0-5 Units Subcutaneous QHS   insulin glargine-yfgn  25 Units Subcutaneous QHS   leptospermum manuka honey  1 Application Topical Daily   losartan  50 mg Oral QHS   Ensure Max Protein  11 oz Oral Daily   zinc sulfate (50mg  elemental zinc)  220 mg Oral Daily   Continuous Infusions:   ceFAZolin (ANCEF) IV 2 g (02/19/24 1006)     LOS: 5 days     Briant Cedar, MD [facility] Available via Epic secure chat 7am-7pm After these hours, please refer to coverage provider listed on [email or web address] 02/19/2024, 5:40 PM

## 2024-02-19 NOTE — Anesthesia Preprocedure Evaluation (Signed)
 Anesthesia Evaluation  Patient identified by MRN, date of birth, ID band Patient awake    Reviewed: Allergy & Precautions, NPO status , Patient's Chart, lab work & pertinent test results  History of Anesthesia Complications Negative for: history of anesthetic complications  Airway Mallampati: III  TM Distance: >3 FB Neck ROM: Full    Dental  (+) Dental Advisory Given   Pulmonary neg pulmonary ROS   Pulmonary exam normal breath sounds clear to auscultation       Cardiovascular hypertension (amlodipine, HCTZ, losartan), Pt. on medications (-) angina (-) Past MI, (-) Cardiac Stents and (-) CABG (-) dysrhythmias  Rhythm:Regular Rate:Normal  HLD   Neuro/Psych neg Seizures  Neuromuscular disease (Bell's palsy, polyneuropathy)    GI/Hepatic negative GI ROS, Neg liver ROS,,,  Endo/Other  diabetes, Type 2, Insulin Dependent, Oral Hypoglycemic Agents    Renal/GU negative Renal ROS     Musculoskeletal   Abdominal  (+) + obese  Peds  Hematology negative hematology ROS (+) Lab Results      Component                Value               Date                      WBC                      9.4                 02/19/2024                HGB                      11.7 (L)            02/19/2024                HCT                      34.4 (L)            02/19/2024                MCV                      80.0                02/19/2024                PLT                      399                 02/19/2024              Anesthesia Other Findings Abscess of right leg  Reproductive/Obstetrics                             Anesthesia Physical Anesthesia Plan  ASA: 3  Anesthesia Plan: General   Post-op Pain Management: Tylenol PO (pre-op)*   Induction: Intravenous  PONV Risk Score and Plan: 2 and Ondansetron, Dexamethasone, Treatment may vary due to age or medical condition and Midazolam  Airway Management  Planned: LMA  Additional Equipment:   Intra-op Plan:   Post-operative Plan: Extubation in OR  Informed Consent: I  have reviewed the patients History and Physical, chart, labs and discussed the procedure including the risks, benefits and alternatives for the proposed anesthesia with the patient or authorized representative who has indicated his/her understanding and acceptance.     Dental advisory given  Plan Discussed with: CRNA and Anesthesiologist  Anesthesia Plan Comments: (Risks of general anesthesia discussed including, but not limited to, sore throat, hoarse voice, chipped/damaged teeth, injury to vocal cords, nausea and vomiting, allergic reactions, lung infection, heart attack, stroke, and death. All questions answered. )        Anesthesia Quick Evaluation

## 2024-02-20 ENCOUNTER — Inpatient Hospital Stay (HOSPITAL_COMMUNITY)

## 2024-02-20 ENCOUNTER — Encounter (HOSPITAL_COMMUNITY): Payer: Self-pay | Admitting: Internal Medicine

## 2024-02-20 ENCOUNTER — Encounter (HOSPITAL_COMMUNITY)
Admission: EM | Disposition: A | Discharge status: Home / Self Care | Payer: Self-pay | Source: Home / Self Care | Attending: Internal Medicine

## 2024-02-20 ENCOUNTER — Other Ambulatory Visit: Payer: Self-pay

## 2024-02-20 DIAGNOSIS — L02415 Cutaneous abscess of right lower limb: Secondary | ICD-10-CM

## 2024-02-20 DIAGNOSIS — S80811A Abrasion, right lower leg, initial encounter: Secondary | ICD-10-CM | POA: Diagnosis not present

## 2024-02-20 DIAGNOSIS — L03115 Cellulitis of right lower limb: Secondary | ICD-10-CM | POA: Diagnosis not present

## 2024-02-20 DIAGNOSIS — L089 Local infection of the skin and subcutaneous tissue, unspecified: Secondary | ICD-10-CM | POA: Diagnosis not present

## 2024-02-20 HISTORY — PX: INCISION AND DRAINAGE OF DEEP ABSCESS, CALF: SHX7361

## 2024-02-20 LAB — CBC WITH DIFFERENTIAL/PLATELET
Abs Immature Granulocytes: 0.08 10*3/uL — ABNORMAL HIGH (ref 0.00–0.07)
Basophils Absolute: 0 10*3/uL (ref 0.0–0.1)
Basophils Relative: 0 %
Eosinophils Absolute: 0.2 10*3/uL (ref 0.0–0.5)
Eosinophils Relative: 2 %
HCT: 35.3 % — ABNORMAL LOW (ref 39.0–52.0)
Hemoglobin: 11.9 g/dL — ABNORMAL LOW (ref 13.0–17.0)
Immature Granulocytes: 1 %
Lymphocytes Relative: 17 %
Lymphs Abs: 1.4 10*3/uL (ref 0.7–4.0)
MCH: 27.2 pg (ref 26.0–34.0)
MCHC: 33.7 g/dL (ref 30.0–36.0)
MCV: 80.8 fL (ref 80.0–100.0)
Monocytes Absolute: 1 10*3/uL (ref 0.1–1.0)
Monocytes Relative: 12 %
Neutro Abs: 5.5 10*3/uL (ref 1.7–7.7)
Neutrophils Relative %: 68 %
Platelets: 400 10*3/uL (ref 150–400)
RBC: 4.37 MIL/uL (ref 4.22–5.81)
RDW: 13.1 % (ref 11.5–15.5)
WBC: 8.1 10*3/uL (ref 4.0–10.5)
nRBC: 0 % (ref 0.0–0.2)

## 2024-02-20 LAB — GLUCOSE, CAPILLARY
Glucose-Capillary: 245 mg/dL — ABNORMAL HIGH (ref 70–99)
Glucose-Capillary: 206 mg/dL — ABNORMAL HIGH (ref 70–99)
Glucose-Capillary: 207 mg/dL — ABNORMAL HIGH (ref 70–99)
Glucose-Capillary: 196 mg/dL — ABNORMAL HIGH (ref 70–99)
Glucose-Capillary: 211 mg/dL — ABNORMAL HIGH (ref 70–99)
Glucose-Capillary: 395 mg/dL — ABNORMAL HIGH (ref 70–99)

## 2024-02-20 LAB — BASIC METABOLIC PANEL WITH GFR
Anion gap: 7 (ref 5–15)
BUN: 14 mg/dL (ref 6–20)
CO2: 25 mmol/L (ref 22–32)
Calcium: 8.7 mg/dL — ABNORMAL LOW (ref 8.9–10.3)
Chloride: 102 mmol/L (ref 98–111)
Creatinine, Ser: 0.72 mg/dL (ref 0.61–1.24)
GFR, Estimated: >60 mL/min (ref >60)
Glucose, Bld: 252 mg/dL — ABNORMAL HIGH (ref 70–99)
Potassium: 3.8 mmol/L (ref 3.5–5.1)
Sodium: 134 mmol/L — ABNORMAL LOW (ref 135–145)

## 2024-02-20 LAB — SURGICAL PCR SCREEN
MRSA, PCR: NEGATIVE
Staphylococcus aureus: NEGATIVE

## 2024-02-20 SURGERY — INCISION AND DRAINAGE OF DEEP ABSCESS, CALF
Anesthesia: General | Site: Leg Lower | Laterality: Right

## 2024-02-20 MED ORDER — METHOCARBAMOL 1000 MG/10ML IJ SOLN: 500.0000 mg | Freq: Four times a day (QID) | INTRAMUSCULAR | Status: DC | PRN

## 2024-02-20 MED ORDER — VASHE WOUND IRRIGATION OPTIME
TOPICAL | Status: DC | PRN
  Administered 2024-02-20: 34 [oz_av]

## 2024-02-20 MED ORDER — 0.9 % SODIUM CHLORIDE (POUR BTL) OPTIME
TOPICAL | Status: DC | PRN
  Administered 2024-02-20: 1000 mL

## 2024-02-20 MED ORDER — CHLORHEXIDINE GLUCONATE 4 % EX SOLN: 60.0000 mL | Freq: Once | CUTANEOUS | Status: DC

## 2024-02-20 MED ORDER — HYDROMORPHONE HCL 1 MG/ML IJ SOLN: 0.2500 mg | INTRAMUSCULAR | Status: DC | PRN

## 2024-02-20 MED ORDER — ACETAMINOPHEN 325 MG PO TABS
325.0000 mg | ORAL_TABLET | Freq: Four times a day (QID) | ORAL | Status: DC | PRN
  Administered 2024-02-21: 650 mg via ORAL
  Filled 2024-02-20: qty 2

## 2024-02-20 MED ORDER — ORAL CARE MOUTH RINSE: 15.0000 mL | Freq: Once | OROMUCOSAL | Status: AC

## 2024-02-20 MED ORDER — SODIUM CHLORIDE 0.9% FLUSH: 3.0000 mL | INTRAVENOUS | Status: DC | PRN

## 2024-02-20 MED ORDER — PHENYLEPHRINE HCL-NACL 20-0.9 MG/250ML-% IV SOLN
INTRAVENOUS | Status: AC
  Filled 2024-02-20: qty 500

## 2024-02-20 MED ORDER — BISACODYL 10 MG RE SUPP: 10.0000 mg | Freq: Every day | RECTAL | Status: DC | PRN

## 2024-02-20 MED ORDER — OXYCODONE HCL 5 MG/5ML PO SOLN: 5.0000 mg | Freq: Once | ORAL | Status: DC | PRN

## 2024-02-20 MED ORDER — MIDAZOLAM HCL 2 MG/2ML IJ SOLN
INTRAMUSCULAR | Status: AC
  Filled 2024-02-20: qty 2

## 2024-02-20 MED ORDER — VANCOMYCIN HCL 1000 MG IV SOLR
INTRAVENOUS | Status: AC
  Filled 2024-02-20: qty 20

## 2024-02-20 MED ORDER — AMISULPRIDE (ANTIEMETIC) 5 MG/2ML IV SOLN: 10.0000 mg | Freq: Once | INTRAVENOUS | Status: DC | PRN

## 2024-02-20 MED ORDER — PHENYLEPHRINE 80 MCG/ML (10ML) SYRINGE FOR IV PUSH (FOR BLOOD PRESSURE SUPPORT)
PREFILLED_SYRINGE | INTRAVENOUS | Status: AC
Start: 2024-02-20 — End: ?
  Filled 2024-02-20: qty 10

## 2024-02-20 MED ORDER — FENTANYL CITRATE (PF) 250 MCG/5ML IJ SOLN
INTRAMUSCULAR | Status: AC
  Filled 2024-02-20: qty 5

## 2024-02-20 MED ORDER — NOREPINEPHRINE 4 MG/250ML-% IV SOLN
INTRAVENOUS | Status: AC
Start: 2024-02-20 — End: ?
  Filled 2024-02-20: qty 250

## 2024-02-20 MED ORDER — CHLORHEXIDINE GLUCONATE 0.12 % MT SOLN
15.0000 mL | Freq: Once | OROMUCOSAL | Status: AC
  Administered 2024-02-20: 15 mL via OROMUCOSAL
  Filled 2024-02-20: qty 15

## 2024-02-20 MED ORDER — PROPOFOL 10 MG/ML IV BOLUS
INTRAVENOUS | Status: AC
  Filled 2024-02-20: qty 20

## 2024-02-20 MED ORDER — VANCOMYCIN HCL 1000 MG IV SOLR
INTRAVENOUS | Status: DC | PRN
  Administered 2024-02-20: 1000 mg

## 2024-02-20 MED ORDER — HYDROMORPHONE HCL 1 MG/ML IJ SOLN
0.5000 mg | INTRAMUSCULAR | Status: DC | PRN
  Administered 2024-02-20 – 2024-02-22 (×2): 1 mg via INTRAVENOUS
  Filled 2024-02-20 (×2): qty 1

## 2024-02-20 MED ORDER — METHOCARBAMOL 500 MG PO TABS
500.0000 mg | ORAL_TABLET | Freq: Four times a day (QID) | ORAL | Status: DC | PRN
  Administered 2024-02-20 – 2024-02-23 (×7): 500 mg via ORAL
  Filled 2024-02-20 (×7): qty 1

## 2024-02-20 MED ORDER — POVIDONE-IODINE 10 % EX SWAB
2.0000 | Freq: Once | CUTANEOUS | Status: AC
  Administered 2024-02-20: 2 via TOPICAL

## 2024-02-20 MED ORDER — DOCUSATE SODIUM 100 MG PO CAPS
100.0000 mg | ORAL_CAPSULE | Freq: Two times a day (BID) | ORAL | Status: DC
  Administered 2024-02-20 – 2024-02-23 (×5): 100 mg via ORAL
  Filled 2024-02-20 (×6): qty 1

## 2024-02-20 MED ORDER — MIDAZOLAM HCL 2 MG/2ML IJ SOLN
INTRAMUSCULAR | Status: DC | PRN
  Administered 2024-02-20: 2 mg via INTRAVENOUS

## 2024-02-20 MED ORDER — PHENYLEPHRINE 80 MCG/ML (10ML) SYRINGE FOR IV PUSH (FOR BLOOD PRESSURE SUPPORT)
PREFILLED_SYRINGE | INTRAVENOUS | Status: DC | PRN
Start: 2024-02-20 — End: 2024-02-20
  Administered 2024-02-20: 80 ug via INTRAVENOUS

## 2024-02-20 MED ORDER — PROPOFOL 10 MG/ML IV BOLUS
INTRAVENOUS | Status: DC | PRN
  Administered 2024-02-20: 200 mg via INTRAVENOUS

## 2024-02-20 MED ORDER — VASOPRESSIN 20 UNIT/ML IV SOLN
INTRAVENOUS | Status: AC
  Filled 2024-02-20: qty 1

## 2024-02-20 MED ORDER — FENTANYL CITRATE (PF) 250 MCG/5ML IJ SOLN
INTRAMUSCULAR | Status: DC | PRN
  Administered 2024-02-20 (×2): 50 ug via INTRAVENOUS

## 2024-02-20 MED ORDER — PROPOFOL 1000 MG/100ML IV EMUL
INTRAVENOUS | Status: AC
  Filled 2024-02-20: qty 200

## 2024-02-20 MED ORDER — ACETAMINOPHEN 500 MG PO TABS
1000.0000 mg | ORAL_TABLET | Freq: Once | ORAL | Status: AC
  Administered 2024-02-20: 1000 mg via ORAL
  Filled 2024-02-20: qty 2

## 2024-02-20 MED ORDER — LACTATED RINGERS IV SOLN: INTRAVENOUS | Status: DC

## 2024-02-20 MED ORDER — SODIUM CHLORIDE 0.9% FLUSH
3.0000 mL | Freq: Two times a day (BID) | INTRAVENOUS | Status: DC
  Administered 2024-02-20 – 2024-02-22 (×5): 10 mL via INTRAVENOUS
  Administered 2024-02-23: 3 mL via INTRAVENOUS
  Administered 2024-02-23: 10 mL via INTRAVENOUS

## 2024-02-20 MED ORDER — OXYCODONE HCL 5 MG PO TABS: 5.0000 mg | ORAL_TABLET | Freq: Once | ORAL | Status: DC | PRN

## 2024-02-20 MED ORDER — ACETAMINOPHEN 10 MG/ML IV SOLN
INTRAVENOUS | Status: AC
  Filled 2024-02-20: qty 300

## 2024-02-20 MED ORDER — LIDOCAINE 2% (20 MG/ML) 5 ML SYRINGE
INTRAMUSCULAR | Status: DC | PRN
  Administered 2024-02-20: 100 mg via INTRAVENOUS

## 2024-02-20 SURGICAL SUPPLY — 41 items
BAG COUNTER SPONGE SURGICOUNT (BAG) IMPLANT
BLADE SURG 21 STRL SS (BLADE) ×2 IMPLANT
BNDG COHESIVE 4X5 TAN STRL LF (GAUZE/BANDAGES/DRESSINGS) IMPLANT
BNDG COHESIVE 6X5 TAN NS LF (GAUZE/BANDAGES/DRESSINGS) IMPLANT
BNDG COHESIVE 6X5 TAN ST LF (GAUZE/BANDAGES/DRESSINGS) IMPLANT
BNDG GAUZE DERMACEA FLUFF 4 (GAUZE/BANDAGES/DRESSINGS) IMPLANT
CANISTER WOUNDNEG PRESSURE 500 (CANNISTER) IMPLANT
CLEANSER WND VASHE 34 (WOUND CARE) IMPLANT
CLEANSER WND VASHE INSTL 34OZ (WOUND CARE) IMPLANT
COVER SURGICAL LIGHT HANDLE (MISCELLANEOUS) ×4 IMPLANT
DRAPE DERMATAC (DRAPES) IMPLANT
DRAPE U-SHAPE 47X51 STRL (DRAPES) ×2 IMPLANT
DRESSING PREVENA PLUS CUSTOM (GAUZE/BANDAGES/DRESSINGS) IMPLANT
DRESSING VERAFLO CLEANS CC MED (GAUZE/BANDAGES/DRESSINGS) IMPLANT
DRSG ADAPTIC 3X8 NADH LF (GAUZE/BANDAGES/DRESSINGS) ×2 IMPLANT
DRSG PREVENA PLUS CUSTOM (GAUZE/BANDAGES/DRESSINGS) ×1 IMPLANT
DRSG VERAFLO CLEANSE CC MED (GAUZE/BANDAGES/DRESSINGS) ×1 IMPLANT
DURAPREP 26ML APPLICATOR (WOUND CARE) ×2 IMPLANT
ELECT REM PT RETURN 9FT ADLT (ELECTROSURGICAL) IMPLANT
ELECTRODE REM PT RTRN 9FT ADLT (ELECTROSURGICAL) IMPLANT
GAUZE PAD ABD 8X10 STRL (GAUZE/BANDAGES/DRESSINGS) IMPLANT
GAUZE SPONGE 4X4 12PLY STRL (GAUZE/BANDAGES/DRESSINGS) IMPLANT
GLOVE BIOGEL PI IND STRL 9 (GLOVE) ×2 IMPLANT
GLOVE SURG ORTHO 9.0 STRL STRW (GLOVE) ×2 IMPLANT
GOWN STRL REUS W/ TWL XL LVL3 (GOWN DISPOSABLE) ×4 IMPLANT
GRAFT SKIN WND MICRO 38 (Tissue) IMPLANT
KIT BASIN OR (CUSTOM PROCEDURE TRAY) ×2 IMPLANT
KIT TURNOVER KIT B (KITS) ×2 IMPLANT
MANIFOLD NEPTUNE II (INSTRUMENTS) ×2 IMPLANT
NS IRRIG 1000ML POUR BTL (IV SOLUTION) ×2 IMPLANT
PACK ORTHO EXTREMITY (CUSTOM PROCEDURE TRAY) ×2 IMPLANT
PAD ARMBOARD POSITIONER FOAM (MISCELLANEOUS) ×4 IMPLANT
PAD NEG PRESSURE SENSATRAC (MISCELLANEOUS) IMPLANT
SET HNDPC FAN SPRY TIP SCT (DISPOSABLE) IMPLANT
STOCKINETTE IMPERVIOUS 9X36 MD (GAUZE/BANDAGES/DRESSINGS) IMPLANT
SUT ETHILON 2 0 PSLX (SUTURE) ×2 IMPLANT
SWAB COLLECTION DEVICE MRSA (MISCELLANEOUS) ×2 IMPLANT
SWAB CULTURE ESWAB REG 1ML (MISCELLANEOUS) IMPLANT
TOWEL GREEN STERILE (TOWEL DISPOSABLE) ×2 IMPLANT
TUBE CONNECTING 12X1/4 (SUCTIONS) ×2 IMPLANT
YANKAUER SUCT BULB TIP NO VENT (SUCTIONS) ×2 IMPLANT

## 2024-02-20 NOTE — Transfer of Care (Signed)
 Immediate Anesthesia Transfer of Care Note  Patient: Ian Hughes  Procedure(s) Performed: INCISION AND DRAINAGE OF DEEP ABSCESS, CALF (Right: Leg Lower)  Patient Location: PACU  Anesthesia Type:General  Level of Consciousness: awake and alert   Airway & Oxygen Therapy: Patient Spontanous Breathing  Post-op Assessment: Report given to RN and Post -op Vital signs reviewed and stable  Post vital signs: Reviewed and stable  Last Vitals:  Vitals Value Taken Time  BP 140/81 02/20/24 1015  Temp 37.2 C 02/20/24 1014  Pulse 75 02/20/24 1016  Resp 18 02/20/24 1016  SpO2 98 % 02/20/24 1016  Vitals shown include unfiled device data.  Last Pain:  Vitals:   02/20/24 0738  TempSrc:   PainSc: 0-No pain      Patients Stated Pain Goal: 2 (02/17/24 1731)  Complications: No notable events documented.

## 2024-02-20 NOTE — Evaluation (Signed)
 Physical Therapy Evaluation Patient Details Name: Ian Hughes MRN: [id number] DOB: October 28, 1981 Today's Date: 02/20/2024  History of Present Illness  Patient is a 43 y/o male admitted 02/13/24 with R LE wound after scraping his leg on a pallet at work.  Noted to have edema, swelling and drainage with abscess.   Underwent IR US guided drainage on 3/29 and I&D on 02/20/24.  PMH positive for cataract surgery, DM, hyperlipidemia.  Clinical Impression  Patient presents with decreased mobility due to pain and limited weight bearing tolerance on L LE.  Note previously active, working driving forklift and living at lower level apartment with flight down.  Currently CGA for mobility at the bedside and not well tolerated.  Feel he will benefit from skilled PT in the acute setting and hopefully will progress as pain improves and not need follow up PT.         If plan is discharge home, recommend the following: A little help with walking and/or transfers;A little help with bathing/dressing/bathroom;Assistance with cooking/housework;Assist for transportation;Help with stairs or ramp for entrance   Can travel by private vehicle        Equipment Recommendations Rolling walker (2 wheels)  Recommendations for Other Services       Functional Status Assessment Patient has had a recent decline in their functional status and demonstrates the ability to make significant improvements in function in a reasonable and predictable amount of time.     Precautions / Restrictions Precautions Precautions: Fall Precaution/Restrictions Comments: L LE wound vac Restrictions Weight Bearing Restrictions Per Provider Order: No      Mobility  Bed Mobility Overal bed mobility: Needs Assistance Bed Mobility: Supine to Sit, Sit to Supine     Supine to sit: Contact guard Sit to supine: Contact guard assist   General bed mobility comments: assist for lines and positioning    Transfers Overall transfer level: Needs  assistance Equipment used: Rolling walker (2 wheels) Transfers: Sit to/from Stand Sit to Stand: Min assist           General transfer comment: cue for hand placement, assist for balance and lines    Ambulation/Gait Ambulation/Gait assistance: Contact guard assist Gait Distance (Feet): 2 Feet Assistive device: Rolling walker (2 wheels) Gait Pattern/deviations: Step-to pattern       General Gait Details: side steps toward [facility], too painful for forward ambulation  Stairs            Wheelchair Mobility     Tilt Bed    Modified Rankin (Stroke Patients Only)       Balance Overall balance assessment: Needs assistance   Sitting balance-Leahy Scale: Good     Standing balance support: Bilateral upper extremity supported, Reliant on assistive device for balance Standing balance-Leahy Scale: Poor                               Pertinent Vitals/Pain Pain Assessment Pain Assessment: 0-10 Pain Score: 8  Pain Location: L LE Pain Descriptors / Indicators: Discomfort, Grimacing, Throbbing, Aching Pain Intervention(s): Monitored during session, Premedicated before session, Repositioned, Limited activity within patient's tolerance    Home Living Family/patient expects to be discharged to:: Private residence Living Arrangements: Spouse/significant other Available Help at Discharge: Family Type of Home: Apartment Home Access: Stairs to enter Entrance Stairs-Rails: Right Entrance Stairs-Number of Steps: 4 to enter building w/ bilat rails; then flight down to apartment with R rail   Home Layout: One  level Home Equipment: None      Prior Function Prior Level of Function : Independent/Modified Independent                     Extremity/Trunk Assessment   Upper Extremity Assessment Upper Extremity Assessment: Overall WFL for tasks assessed    Lower Extremity Assessment Lower Extremity Assessment: RLE deficits/detail;LLE deficits/detail RLE  Deficits / Details: AROM and strength WFL, kerlix wrap on lower leg LLE Deficits / Details: AROM grossly WFL slower and painful with knee extension and ankle DF; coban wrap and wound vac on L lower leg LLE Sensation: decreased light touch       Communication   Communication Communication: No apparent difficulties    Cognition Arousal: Alert Behavior During Therapy: WFL for tasks assessed/performed   PT - Cognitive impairments: No apparent impairments                         Following commands: Intact       Cueing       General Comments General comments (skin integrity, edema, etc.): wife present, though heading to work, works in [employer] here    Exercises     Assessment/Plan    PT Assessment Patient needs continued PT services  PT Problem List Decreased mobility;Decreased activity tolerance;Pain;Decreased knowledge of use of DME       PT Treatment Interventions DME instruction;Therapeutic exercise;Balance training;Gait training;Stair training;Functional mobility training;Therapeutic activities;Patient/family education    PT Goals (Current goals can be found in the Care Plan section)  Acute Rehab PT Goals Patient Stated Goal: return to independent PT Goal Formulation: With patient/family Time For Goal Achievement: 02/27/24 Potential to Achieve Goals: Good    Frequency Min 3X/week     Co-evaluation               AM-PAC PT "6 Clicks" Mobility  Outcome Measure Help needed turning from your back to your side while in a flat bed without using bedrails?: A Little Help needed moving from lying on your back to sitting on the side of a flat bed without using bedrails?: A Little Help needed moving to and from a bed to a chair (including a wheelchair)?: A Little Help needed standing up from a chair using your arms (e.g., wheelchair or bedside chair)?: A Little Help needed to walk in hospital room?: Total Help needed climbing 3-5 steps with a railing? :  Total 6 Click Score: 14    End of Session Equipment Utilized During Treatment: Gait belt Activity Tolerance: Patient limited by pain Patient left: in bed;with call bell/phone within reach   PT Visit Diagnosis: Difficulty in walking, not elsewhere classified (R26.2);Pain Pain - Right/Left: Left Pain - part of body: Leg    Time: 3474-2595 PT Time Calculation (min) (ACUTE ONLY): 20 min   Charges:   PT Evaluation $PT Eval Low Complexity: 1 Low   PT General Charges $$ ACUTE PT VISIT: 1 Visit         Sheran Lawless, PT Acute Rehabilitation Services [phone] 02/20/2024   Elray Mcgregor 02/20/2024, 6:08 PM

## 2024-02-20 NOTE — Interval H&P Note (Signed)
 History and Physical Interval Note:  02/20/2024 6:43 AM  Ian Hughes  has presented today for surgery, with the diagnosis of Abscess of right leg.  The various methods of treatment have been discussed with the patient and family. After consideration of risks, benefits and other options for treatment, the patient has consented to  Procedure(s) with comments: INCISION AND DRAINAGE OF DEEP ABSCESS, CALF (Right) - RIGHT LEG DEBRIDEMENT as a surgical intervention.  The patient's history has been reviewed, patient examined, no change in status, stable for surgery.  I have reviewed the patient's chart and labs.  Questions were answered to the patient's satisfaction.     Nadara Mustard

## 2024-02-20 NOTE — Progress Notes (Signed)
 PROGRESS NOTE    Ian Hughes  QMV:[id number] DOB: January 23, 1981 DOA: 02/13/2024 PCP: Rema Fendt, NP   Brief Narrative:  Ian Hughes is a 43 y.o. male with past medical history significant for HTN, HLD, DM2, obesity who presented to [facility] ED on 3/26 with worsening wound to right lower extremity with increased swelling, and drainage after scraping his leg on a pallet at work 3 days prior to presentation.  Patient was found to have diffuse soft tissue swelling of the right tibia and fibula initiated on vancomycin and cefepime, hospitalist called for admission.  Assessment & Plan:   Principal Problem:   Cellulitis of right lower leg Active Problems:   Insulin dependent type 2 diabetes mellitus (HCC)   AKI (acute kidney injury) (HCC)   Abscess of right lower leg   Right lower extremity wound/cellulitis with abscess/phlegmon, POA Sepsis secondary to lower extremity wound/cellulitis/abscess, POA Left lower extremity wound Sepsis criteria met with low-grade fever, leukocytosis and notable source as above Imaging confirmed superficial soft tissue wound/defect lateral aspect proximal mid calf with 5.3 x 2.8 x 3.6 cm intramuscular phlegmon versus developing abscess soleus muscle Orthopedics was consulted -surgical debridement 02/20/24, tolerated well IR completed aspiration on 02/16/2024: aspiration culture: MSSA Continue IV Ancef   Type 2 diabetes mellitus, with hyperglycemia, uncontrolled Hemoglobin A1c 12.8 on 01/08/2024 SSI, Semglee, Accu-Cheks, hypoglycemic protocol Home regimen includes insulin glargine 13 units subcutaneously daily, metformin 1000 mg p.o. twice daily   Essential hypertension Continue amlodipine, Losartan and HCTZ    Hyperlipidemia Atorvastatin 10 mg p.o. daily   Obesity, class II Body mass index is 35.73 kg/m.  DVT prophylaxis: enoxaparin (LOVENOX) injection 40 mg Start: 02/14/24 1800   Code Status:   Code Status: Full Code  Family  Communication: At bedside, mother  Status is: Inpatient  Dispo: The patient is from: Home              Anticipated d/c is to: Home              Anticipated d/c date is: 48 to 72 hours              Patient currently not medically stable for discharge given he requires reevaluation and repeat surgical intervention on 02/22/24  Consultants:  Orthopedic surgery  Procedures:  Surgical debridement and aspiration as above  Antimicrobials:  Cefazolin  Subjective: No acute issues or events overnight, tolerated surgical procedure well otherwise denies nausea vomiting diarrhea constipation headache fevers chills chest pain  Objective: Vitals:   02/19/24 0853 02/19/24 1722 02/19/24 1955 02/20/24 0459  BP: 132/85 129/79 (!) 157/89 (!) 157/89  Pulse: 78 87 88 80  Resp: 17 17 17 17   Temp: 98.7 F (37.1 C) 98.8 F (37.1 C) 98.3 F (36.8 C) 98.1 F (36.7 C)  TempSrc: Oral Oral Oral Oral  SpO2: 100% 100% 98% 100%  Weight:      Height:       No intake or output data in the 24 hours ending 02/20/24 0800 Filed Weights   02/13/24 2302  Weight: 112.9 kg    Examination:  General:  Pleasantly resting in bed, No acute distress. HEENT:  Normocephalic atraumatic.  Sclerae nonicteric, noninjected.  Extraocular movements intact bilaterally. Neck:  Without mass or deformity.  Trachea is midline. Lungs:  Clear to auscultate bilaterally without rhonchi, wheeze, or rales. Heart:  Regular rate and rhythm.  Without murmurs, rubs, or gallops. Abdomen:  Soft, nontender, nondistended.  Without guarding or rebound. Extremities:  Left lower extremity bandage clean dry intact  Data Reviewed: I have personally reviewed following labs and imaging studies  CBC: Recent Labs  Lab 02/13/24 2313 02/14/24 0418 02/16/24 0737 02/17/24 0900 02/18/24 0747 02/19/24 0616 02/20/24 0545  WBC 14.5*   < > 15.4* 12.3* 10.1 9.4 8.1  NEUTROABS 11.4*  --   --   --   --   --  5.5  HGB 12.6*   < > 11.4* 12.9* 12.3*  11.7* 11.9*  HCT 36.6*   < > 34.5* 40.3 36.4* 34.4* 35.3*  MCV 79.7*   < > 83.7 84.8 80.4 80.0 80.8  PLT 388   < > 365 383 392 399 400   < > = values in this interval not displayed.   Basic Metabolic Panel: Recent Labs  Lab 02/16/24 0737 02/17/24 0900 02/18/24 0747 02/19/24 0616 02/20/24 0545  NA 135 134* 135 136 134*  K 3.5 3.8 3.8 3.9 3.8  CL 101 99 101 101 102  CO2 22 24 24 25 25   GLUCOSE 189* 223* 255* 207* 252*  BUN 11 14 15 14 14   CREATININE 0.87 0.84 0.88 0.85 0.72  CALCIUM 8.6* 8.8* 8.8* 8.9 8.7*  MG 2.0  --   --   --   --    GFR: Estimated Creatinine Clearance: 151.4 mL/min (by C-G formula based on SCr of 0.72 mg/dL). Liver Function Tests: Recent Labs  Lab 02/14/24 0418  AST 10*  ALT 19  ALKPHOS 91  BILITOT 0.4  PROT 6.3*  ALBUMIN 3.3*   No results for input(s): "LIPASE", "AMYLASE" in the last 168 hours. No results for input(s): "AMMONIA" in the last 168 hours. Coagulation Profile: No results for input(s): "INR", "PROTIME" in the last 168 hours. Cardiac Enzymes: No results for input(s): "CKTOTAL", "CKMB", "CKMBINDEX", "TROPONINI" in the last 168 hours. BNP (last 3 results) No results for input(s): "PROBNP" in the last 8760 hours. HbA1C: No results for input(s): "HGBA1C" in the last 72 hours. CBG: Recent Labs  Lab 02/19/24 1000 02/19/24 1221 02/19/24 1720 02/19/24 2119 02/20/24 0649  GLUCAP 231* 167* 182* 258* 245*   Lipid Profile: No results for input(s): "CHOL", "HDL", "LDLCALC", "TRIG", "CHOLHDL", "LDLDIRECT" in the last 72 hours. Thyroid Function Tests: No results for input(s): "TSH", "T4TOTAL", "FREET4", "T3FREE", "THYROIDAB" in the last 72 hours. Anemia Panel: No results for input(s): "VITAMINB12", "FOLATE", "FERRITIN", "TIBC", "IRON", "RETICCTPCT" in the last 72 hours. Sepsis Labs: Recent Labs  Lab 02/13/24 2313  LATICACIDVEN 0.9    Recent Results (from the past 240 hours)  Culture, blood (Routine X 2) w Reflex to ID Panel      Status: None   Collection Time: 02/14/24  1:00 AM   Specimen: BLOOD LEFT ARM  Result Value Ref Range Status   Specimen Description   Final    BLOOD LEFT ARM Performed at [facility], [address], [place], [place] [address]    Special Requests   Final    BOTTLES DRAWN AEROBIC AND ANAEROBIC Blood Culture adequate volume Performed at [facility], [address] [place], [place] [address]    Culture   Final    NO GROWTH 5 DAYS Performed at [facility], [address], [place], [place] [address]    Report Status 02/19/2024 FINAL  Final  Culture, blood (Routine X 2) w Reflex to ID Panel     Status: None   Collection Time: 02/14/24  1:15 AM   Specimen: BLOOD RIGHT ARM  Result  Value Ref Range Status   Specimen Description   Final    BLOOD RIGHT ARM Performed at [facility], [address], [place], [place] [address]    Special Requests   Final    BOTTLES DRAWN AEROBIC AND ANAEROBIC Blood Culture adequate volume Performed at [facility], [address] [place], [place] [address]    Culture   Final    NO GROWTH 5 DAYS Performed at [facility], [address], [place], [place] [address]    Report Status 02/19/2024 FINAL  Final  Aerobic/Anaerobic Culture w Gram Stain (surgical/deep wound)     Status: None (Preliminary result)   Collection Time: 02/16/24  1:29 PM   Specimen: Abscess  Result Value Ref Range Status   Specimen Description   Final    ABSCESS Performed at [facility], [address] [place], [place] [address]    Special Requests   Final    NONE Performed at [facility], [address], [place], [place] [address]    Gram Stain   Final    ABUNDANT WBC PRESENT, PREDOMINANTLY PMN MODERATE GRAM POSITIVE COCCI Performed at [name], [address] [place], [place] [address]    Culture   Final    ABUNDANT STAPHYLOCOCCUS  AUREUS NO ANAEROBES ISOLATED; CULTURE IN PROGRESS FOR 5 DAYS    Report Status PENDING  Incomplete   Organism ID, Bacteria STAPHYLOCOCCUS AUREUS  Final      Susceptibility   Staphylococcus aureus - MIC*    CIPROFLOXACIN <=0.5 SENSITIVE Sensitive     ERYTHROMYCIN >=8 RESISTANT Resistant     GENTAMICIN <=0.5 SENSITIVE Sensitive     OXACILLIN 0.5 SENSITIVE Sensitive     TETRACYCLINE <=1 SENSITIVE Sensitive     VANCOMYCIN 1 SENSITIVE Sensitive     TRIMETH/SULFA <=10 SENSITIVE Sensitive     CLINDAMYCIN RESISTANT Resistant     RIFAMPIN <=0.5 SENSITIVE Sensitive     Inducible Clindamycin POSITIVE Resistant     LINEZOLID 2 SENSITIVE Sensitive     * ABUNDANT STAPHYLOCOCCUS AUREUS         Radiology Studies: No results found.      Scheduled Meds:  [MAR Hold] amLODipine  5 mg Oral Daily   [MAR Hold] ascorbic acid  500 mg Oral BID   [MAR Hold] atorvastatin  10 mg Oral QHS   chlorhexidine  60 mL Topical Once   [MAR Hold] enoxaparin (LOVENOX) injection  40 mg Subcutaneous Q24H   [MAR Hold] gabapentin  300 mg Oral QHS   [MAR Hold] hydrochlorothiazide  12.5 mg Oral Daily   [MAR Hold] insulin aspart  0-15 Units Subcutaneous TID WC   [MAR Hold] insulin aspart  0-5 Units Subcutaneous QHS   [MAR Hold] insulin glargine-yfgn  25 Units Subcutaneous QHS   [MAR Hold] leptospermum manuka honey  1 Application Topical Daily   [MAR Hold] losartan  50 mg Oral QHS   [MAR Hold] Ensure Max Protein  11 oz Oral Daily   [MAR Hold] zinc sulfate (50mg  elemental zinc)  220 mg Oral Daily   Continuous Infusions:  [MAR Hold]  ceFAZolin (ANCEF) IV 2 g (02/20/24 0238)   lactated ringers       LOS: 6 days   Time spent:  Azucena Fallen, DO [facility]  If 7PM-7AM, please contact night-coverage [email or web address]  02/20/2024, 8:00 AM

## 2024-02-20 NOTE — Anesthesia Postprocedure Evaluation (Signed)
 Anesthesia Post Note  Patient: Ian Hughes  Procedure(s) Performed: INCISION AND DRAINAGE OF DEEP ABSCESS, CALF (Right: Leg Lower)     Patient location during evaluation: PACU Anesthesia Type: General Level of consciousness: awake Pain management: pain level controlled Vital Signs Assessment: post-procedure vital signs reviewed and stable Respiratory status: spontaneous breathing, nonlabored ventilation and respiratory function stable Cardiovascular status: blood pressure returned to baseline and stable Postop Assessment: no apparent nausea or vomiting Anesthetic complications: no   No notable events documented.  Last Vitals:  Vitals:   02/20/24 1045 02/20/24 1102  BP: 118/76 (!) 140/91  Pulse: 74 76  Resp: 15 18  Temp: 37.2 C 36.6 C  SpO2: 96% 97%    Last Pain:  Vitals:   02/20/24 1146  TempSrc:   PainSc: 8                  Linton Rump

## 2024-02-20 NOTE — Op Note (Signed)
 02/20/2024  10:29 AM  PATIENT:  Ian Hughes    PRE-OPERATIVE DIAGNOSIS:  Abscess of right leg  POST-OPERATIVE DIAGNOSIS:  Same  PROCEDURE: Excisional debridement skin soft tissue muscle and fascia anterior and lateral compartment right leg. Application Kerecis micro graft 38 cm and 1 g vancomycin powder. Application of cleanse choice wound VAC sponge x 1. Muscle fascia and abscess sent for cultures.  SURGEON:  Nadara Mustard, MD  PHYSICIAN ASSISTANT:None ANESTHESIA:   General  PREOPERATIVE INDICATIONS:  [name] is a  43 y.o. male with a diagnosis of Abscess of right leg who failed conservative measures and elected for surgical management.    The risks benefits and alternatives were discussed with the patient preoperatively including but not limited to the risks of infection, bleeding, nerve injury, cardiopulmonary complications, the need for revision surgery, among others, and the patient was willing to proceed.  OPERATIVE IMPLANTS:   Implant Name Type Inv. Item Serial No. Manufacturer Lot No. LRB No. Used Action  GRAFT SKIN WND MICRO 38 - [id number] Tissue GRAFT SKIN WND MICRO 38  [employer] [id number] Right 1 Implanted    @ENCIMAGES @  OPERATIVE FINDINGS: Patient had a intramuscular abscess.  There was necrotic fascia and muscle.  OPERATIVE PROCEDURE: Patient was brought the operating room underwent a general anesthetic.  After adequate levels anesthesia were obtained patient's right lower extremity was prepped using DuraPrep draped into a sterile field a timeout was called.  Elliptical incision was made around the ulcerative tissue this was carried proximal and distal.  Patient had necrotic abscess tissue involving the lateral gastrocnemius muscle.  Muscle and fascia was excised.  Patient also had infected fascia over the anterior compartment there was also excised.  Electrocautery was used for hemostasis.  The muscle fascia and skin was sent for cultures.  The wound  was irrigated with Vashe.  The wound bed was then filled with 38 cm of Kerecis micro graft and 1 g vancomycin powder to cover a wound surface area that was 13 x 5 cm.  The wound was 3 cm deep.  The wound was then covered with a cleanse choice wound VAC sponge covered with derma tack Ioban and Coban.  This had a good suction fit patient was extubated taken the PACU in stable condition.   DISCHARGE PLANNING:  Antibiotic duration: Continue antibiotics adjust according to culture sensitivities.  Weightbearing: Weightbearing as tolerated.  Pain medication: Continue opioid pathway.  Dressing care/ Wound VAC: Wound VAC.  Ambulatory devices: Walker or crutches.  Plan for return to the operating room on Friday for repeat debridement application of tissue graft.  Anticipate after surgery on Friday patient can be discharged on oral antibiotics with a Prevena plus portable wound VAC pump.

## 2024-02-20 NOTE — Anesthesia Procedure Notes (Addendum)
 Procedure Name: LMA Insertion Date/Time: 02/20/2024 9:30 AM  Performed by: Billey Chang, RNPre-anesthesia Checklist: Patient identified, Emergency Drugs available, Suction available and Patient being monitored Patient Re-evaluated:Patient Re-evaluated prior to induction Oxygen Delivery Method: Circle system utilized Preoxygenation: Pre-oxygenation with 100% oxygen Induction Type: IV induction Ventilation: Mask ventilation without difficulty LMA: LMA inserted LMA Size: 5.0 Tube type: Oral Number of attempts: 1 Placement Confirmation: positive ETCO2, breath sounds checked- equal and bilateral and CO2 detector Tube secured with: Tape Dental Injury: Teeth and Oropharynx as per pre-operative assessment

## 2024-02-21 ENCOUNTER — Encounter (HOSPITAL_COMMUNITY): Payer: Self-pay | Admitting: Orthopedic Surgery

## 2024-02-21 DIAGNOSIS — L03115 Cellulitis of right lower limb: Secondary | ICD-10-CM | POA: Diagnosis not present

## 2024-02-21 LAB — BASIC METABOLIC PANEL WITH GFR
Anion gap: 9 (ref 5–15)
BUN: 14 mg/dL (ref 6–20)
CO2: 25 mmol/L (ref 22–32)
Calcium: 8.6 mg/dL — ABNORMAL LOW (ref 8.9–10.3)
Chloride: 100 mmol/L (ref 98–111)
Creatinine, Ser: 0.99 mg/dL (ref 0.61–1.24)
GFR, Estimated: >60 mL/min (ref >60)
Glucose, Bld: 268 mg/dL — ABNORMAL HIGH (ref 70–99)
Potassium: 4.2 mmol/L (ref 3.5–5.1)
Sodium: 134 mmol/L — ABNORMAL LOW (ref 135–145)

## 2024-02-21 LAB — CBC
HCT: 34.7 % — ABNORMAL LOW (ref 39.0–52.0)
Hemoglobin: 11.6 g/dL — ABNORMAL LOW (ref 13.0–17.0)
MCH: 27.4 pg (ref 26.0–34.0)
MCHC: 33.4 g/dL (ref 30.0–36.0)
MCV: 82 fL (ref 80.0–100.0)
Platelets: 403 10*3/uL — ABNORMAL HIGH (ref 150–400)
RBC: 4.23 MIL/uL (ref 4.22–5.81)
RDW: 13.2 % (ref 11.5–15.5)
WBC: 11.4 10*3/uL — ABNORMAL HIGH (ref 4.0–10.5)
nRBC: 0 % (ref 0.0–0.2)

## 2024-02-21 LAB — GLUCOSE, CAPILLARY
Glucose-Capillary: 247 mg/dL — ABNORMAL HIGH (ref 70–99)
Glucose-Capillary: 292 mg/dL — ABNORMAL HIGH (ref 70–99)
Glucose-Capillary: 210 mg/dL — ABNORMAL HIGH (ref 70–99)
Glucose-Capillary: 210 mg/dL — ABNORMAL HIGH (ref 70–99)

## 2024-02-21 LAB — AEROBIC/ANAEROBIC CULTURE W GRAM STAIN (SURGICAL/DEEP WOUND)

## 2024-02-21 NOTE — Progress Notes (Signed)
 Physical Therapy Treatment Patient Details Name: Ian Hughes MRN: [id number] DOB: 1981-09-09 Today's Date: 02/21/2024   History of Present Illness Patient is a 43 y/o male admitted 02/13/24 with R LE wound after scraping his leg on a pallet at work.  Noted to have edema, swelling and drainage with abscess.   Underwent IR US guided drainage on 3/29 and I&D on 02/20/24.  PMH positive for cataract surgery, DM, hyperlipidemia.    PT Comments  Progressing to hallway ambulation this session.  Patient reported using bed pad earlier for BM due to worried about getting up.  Encouraged to use bathroom with nursing assistance.  Patient needing to practice stairs prior to d/c.  Will attempt in am prior to return to OR in pm (per pt).    If plan is discharge home, recommend the following: A little help with walking and/or transfers;A little help with bathing/dressing/bathroom;Assistance with cooking/housework;Assist for transportation;Help with stairs or ramp for entrance   Can travel by private vehicle        Equipment Recommendations  Rolling walker (2 wheels)    Recommendations for Other Services       Precautions / Restrictions Precautions Precautions: Fall Precaution/Restrictions Comments: L LE wound vac     Mobility  Bed Mobility Overal bed mobility: Needs Assistance Bed Mobility: Supine to Sit, Sit to Supine     Supine to sit: Supervision Sit to supine: Used rails   General bed mobility comments: used rails to scoot oue able to get legs off bed unaided    Transfers Overall transfer level: Needs assistance Equipment used: Rolling walker (2 wheels) Transfers: Bed to chair/wheelchair/BSC Sit to Stand: From elevated surface, Contact guard assist           General transfer comment: cue for technique    Ambulation/Gait Ambulation/Gait assistance: Contact guard assist, Supervision Gait Distance (Feet): 90 Feet (x 2) Assistive device: Rolling walker (2 wheels) Gait  Pattern/deviations: Step-to pattern, Decreased step length - right, Antalgic       General Gait Details: feeling weak in hallway so seated rest, then able to walk back to room   Stairs             Wheelchair Mobility     Tilt Bed    Modified Rankin (Stroke Patients Only)       Balance Overall balance assessment: Needs assistance   Sitting balance-Leahy Scale: Good     Standing balance support: Reliant on assistive device for balance, Bilateral upper extremity supported Standing balance-Leahy Scale: Poor                              Communication Communication Communication: No apparent difficulties  Cognition Arousal: Alert Behavior During Therapy: WFL for tasks assessed/performed   PT - Cognitive impairments: No apparent impairments                         Following commands: Intact      Cueing    Exercises      General Comments General comments (skin integrity, edema, etc.): reported used bed pan earlier for BM, encouraged up to bathroom with nursing assist      Pertinent Vitals/Pain Pain Assessment Pain Assessment: Faces Faces Pain Scale: Hurts whole lot Pain Location: L LE Pain Descriptors / Indicators: Aching, Crushing Pain Intervention(s): Monitored during session, Repositioned, Premedicated before session    Home Living  Prior Function            PT Goals (current goals can now be found in the care plan section) Progress towards PT goals: Progressing toward goals    Frequency    Min 3X/week      PT Plan      Co-evaluation              AM-PAC PT "6 Clicks" Mobility   Outcome Measure  Help needed turning from your back to your side while in a flat bed without using bedrails?: A Little Help needed moving from lying on your back to sitting on the side of a flat bed without using bedrails?: A Little Help needed moving to and from a bed to a chair (including a  wheelchair)?: A Little Help needed standing up from a chair using your arms (e.g., wheelchair or bedside chair)?: A Little Help needed to walk in hospital room?: A Little Help needed climbing 3-5 steps with a railing? : Total 6 Click Score: 16    End of Session Equipment Utilized During Treatment: Gait belt Activity Tolerance: Patient limited by fatigue Patient left: in bed;with call bell/phone within reach   PT Visit Diagnosis: Difficulty in walking, not elsewhere classified (R26.2);Pain Pain - Right/Left: Left Pain - part of body: Leg     Time: 1610-9604 PT Time Calculation (min) (ACUTE ONLY): 29 min  Charges:    $Gait Training: 8-22 mins $Therapeutic Activity: 8-22 mins PT General Charges $$ ACUTE PT VISIT: 1 Visit                     Sheran Lawless, PT Acute Rehabilitation Services Office:[phone] 02/21/2024    Elray Mcgregor 02/21/2024, 5:02 PM

## 2024-02-21 NOTE — H&P (View-Only) (Signed)
 Patient ID: Ian Hughes, male   DOB: 1981-03-11, 43 y.o.   MRN: [id number] Patient is postoperative day 1 debridement of necrotic muscle and abscess proximal lateral right leg.  Patient has sensation of the dorsum of his foot and has good ankle plantarflexion dorsiflexion.  There is 150 cc in the wound VAC canister.  Interoperative cultures are showing Staph aureus.  Most likely the sensitivities are the same as the initial aspirate.  Plan for return to the operating room tomorrow, Friday for repeat debridement and tissue graft.  Patient may discharge on a Prevena plus portable wound VAC pump, and oral antibiotics after patient is safe with therapy.

## 2024-02-21 NOTE — Progress Notes (Signed)
 Patient ID: Ian Hughes, male   DOB: 1981-03-11, 43 y.o.   MRN: [id number] Patient is postoperative day 1 debridement of necrotic muscle and abscess proximal lateral right leg.  Patient has sensation of the dorsum of his foot and has good ankle plantarflexion dorsiflexion.  There is 150 cc in the wound VAC canister.  Interoperative cultures are showing Staph aureus.  Most likely the sensitivities are the same as the initial aspirate.  Plan for return to the operating room tomorrow, Friday for repeat debridement and tissue graft.  Patient may discharge on a Prevena plus portable wound VAC pump, and oral antibiotics after patient is safe with therapy.

## 2024-02-21 NOTE — Progress Notes (Addendum)
 PROGRESS NOTE    Ian Hughes  Ian Hughes:[id number] DOB: October 09, 1981 DOA: 02/13/2024 PCP: Rema Fendt, NP   Brief Narrative:  Ian Hughes is a 43 y.o. male with past medical history significant for HTN, HLD, DM2, obesity who presented to [facility] ED on 3/26 with worsening wound to right lower extremity with increased swelling, and drainage after scraping his leg on a pallet at work 3 days prior to presentation.  Patient was found to have diffuse soft tissue swelling of the right tibia and fibula initiated on vancomycin and cefepime, hospitalist called for admission.  Assessment & Plan:   Principal Problem:   Cellulitis of right lower leg Active Problems:   Insulin dependent type 2 diabetes mellitus (HCC)   AKI (acute kidney injury) (HCC)   Abscess of right lower leg   Infected abrasion of right leg   Right lower extremity wound/cellulitis with abscess/phlegmon, POA Sepsis secondary to lower extremity wound/cellulitis/abscess, POA Left lower extremity wound Sepsis criteria met with low-grade fever, leukocytosis and notable source as above Imaging confirmed superficial soft tissue wound/defect lateral aspect proximal mid calf with 5.3 x 2.8 x 3.6 cm intramuscular phlegmon versus developing abscess soleus muscle Orthopedics was consulted -surgical debridement 02/20/24, tolerated well- will return to the OR 02/22/24 for repeat debridement IR completed aspiration on 02/16/2024: aspiration culture: MSSA Continue IV Ancef   Type 2 diabetes mellitus, with hyperglycemia, uncontrolled Hemoglobin A1c 12.8 on 01/08/2024 SSI, Semglee, Accu-Cheks, hypoglycemic protocol Home regimen includes insulin glargine 13 units subcutaneously daily, metformin 1000 mg p.o. twice daily Currently borderline hyperglycemia, avoid tight control given n.p.o. status perioperatively   Essential hypertension Continue amlodipine, Losartan and HCTZ    Hyperlipidemia Atorvastatin 10 mg p.o. daily    Obesity, class II Body mass index is 35.73 kg/m.  DVT prophylaxis: SCDs Start: 02/20/24 1054 enoxaparin (LOVENOX) injection 40 mg Start: 02/14/24 1800   Code Status:   Code Status: Full Code  Family Communication: At bedside, mother  Status is: Inpatient  Dispo: The patient is from: Home              Anticipated d/c is to: Home              Anticipated d/c date is: 48 to 72 hours              Patient currently not medically stable for discharge given he requires reevaluation and repeat surgical intervention on 02/22/24  Consultants:  Orthopedic surgery  Procedures:  Surgical debridement and aspiration as above  Antimicrobials:  Cefazolin  Subjective: No acute issues or events overnight, tolerated surgical procedure well otherwise denies nausea vomiting diarrhea constipation headache fevers chills chest pain  Objective: Vitals:   02/20/24 1102 02/20/24 1534 02/20/24 2047 02/21/24 0532  BP: (!) 140/91 134/85 (!) 140/73 112/71  Pulse: 76 75 91   Resp: 18 18 17 16   Temp: 97.9 F (36.6 C) 97.8 F (36.6 C) 98.8 F (37.1 C) 99.4 F (37.4 C)  TempSrc: Oral Oral Oral Oral  SpO2: 97% 98% 98% 99%  Weight:      Height:        Intake/Output Summary (Last 24 hours) at 02/21/2024 0714 Last data filed at 02/21/2024 0534 Gross per 24 hour  Intake 126.92 ml  Output 925 ml  Net -798.08 ml   Filed Weights   02/13/24 2302  Weight: 112.9 kg    Examination:  General:  Pleasantly resting in bed, No acute distress. HEENT:  Normocephalic atraumatic.  Sclerae  nonicteric, noninjected.  Extraocular movements intact bilaterally. Neck:  Without mass or deformity.  Trachea is midline. Lungs:  Clear to auscultate bilaterally without rhonchi, wheeze, or rales. Heart:  Regular rate and rhythm.  Without murmurs, rubs, or gallops. Abdomen:  Soft, nontender, nondistended.  Without guarding or rebound. Extremities: Left lower extremity bandage clean dry intact  Data Reviewed: I have  personally reviewed following labs and imaging studies  CBC: Recent Labs  Lab 02/16/24 0737 02/17/24 0900 02/18/24 0747 02/19/24 0616 02/20/24 0545  WBC 15.4* 12.3* 10.1 9.4 8.1  NEUTROABS  --   --   --   --  5.5  HGB 11.4* 12.9* 12.3* 11.7* 11.9*  HCT 34.5* 40.3 36.4* 34.4* 35.3*  MCV 83.7 84.8 80.4 80.0 80.8  PLT 365 383 392 399 400   Basic Metabolic Panel: Recent Labs  Lab 02/16/24 0737 02/17/24 0900 02/18/24 0747 02/19/24 0616 02/20/24 0545  NA 135 134* 135 136 134*  K 3.5 3.8 3.8 3.9 3.8  CL 101 99 101 101 102  CO2 22 24 24 25 25   GLUCOSE 189* 223* 255* 207* 252*  BUN 11 14 15 14 14   CREATININE 0.87 0.84 0.88 0.85 0.72  CALCIUM 8.6* 8.8* 8.8* 8.9 8.7*  MG 2.0  --   --   --   --    GFR: Estimated Creatinine Clearance: 151.4 mL/min (by C-G formula based on SCr of 0.72 mg/dL). Liver Function Tests: No results for input(s): "AST", "ALT", "ALKPHOS", "BILITOT", "PROT", "ALBUMIN" in the last 168 hours.  No results for input(s): "LIPASE", "AMYLASE" in the last 168 hours. No results for input(s): "AMMONIA" in the last 168 hours. Coagulation Profile: No results for input(s): "INR", "PROTIME" in the last 168 hours. Cardiac Enzymes: No results for input(s): "CKTOTAL", "CKMB", "CKMBINDEX", "TROPONINI" in the last 168 hours. BNP (last 3 results) No results for input(s): "PROBNP" in the last 8760 hours. HbA1C: No results for input(s): "HGBA1C" in the last 72 hours. CBG: Recent Labs  Lab 02/20/24 1015 02/20/24 1116 02/20/24 1533 02/20/24 2132 02/21/24 0601  GLUCAP 207* 196* 211* 395* 247*   Lipid Profile: No results for input(s): "CHOL", "HDL", "LDLCALC", "TRIG", "CHOLHDL", "LDLDIRECT" in the last 72 hours. Thyroid Function Tests: No results for input(s): "TSH", "T4TOTAL", "FREET4", "T3FREE", "THYROIDAB" in the last 72 hours. Anemia Panel: No results for input(s): "VITAMINB12", "FOLATE", "FERRITIN", "TIBC", "IRON", "RETICCTPCT" in the last 72 hours. Sepsis  Labs: No results for input(s): "PROCALCITON", "LATICACIDVEN" in the last 168 hours.   Recent Results (from the past 240 hours)  Culture, blood (Routine X 2) w Reflex to ID Panel     Status: None   Collection Time: 02/14/24  1:00 AM   Specimen: BLOOD LEFT ARM  Result Value Ref Range Status   Specimen Description   Final    BLOOD LEFT ARM Performed at [facility], [address] [place], [place] [address]    Special Requests   Final    BOTTLES DRAWN AEROBIC AND ANAEROBIC Blood Culture adequate volume Performed at [facility], [address] [place], [place] [address]    Culture   Final    NO GROWTH 5 DAYS Performed at [facility], [address] [place], [place] [address]    Report Status 02/19/2024 FINAL  Final  Culture, blood (Routine X 2) w Reflex to ID Panel     Status: None   Collection Time: 02/14/24  1:15 AM   Specimen: BLOOD RIGHT ARM  Result Value Ref  Range Status   Specimen Description   Final    BLOOD RIGHT ARM Performed at [facility], [address], [place], [place] [address]    Special Requests   Final    BOTTLES DRAWN AEROBIC AND ANAEROBIC Blood Culture adequate volume Performed at [facility], [address], [name], [place] [address]    Culture   Final    NO GROWTH 5 DAYS Performed at [facility], [address] [place], [place] [address]    Report Status 02/19/2024 FINAL  Final  Aerobic/Anaerobic Culture w Gram Stain (surgical/deep wound)     Status: None (Preliminary result)   Collection Time: 02/16/24  1:29 PM   Specimen: Abscess  Result Value Ref Range Status   Specimen Description   Final    ABSCESS Performed at [facility], [address] [place], [place] [address]    Special Requests   Final    NONE Performed at [facility], [address] [place], [place] [address]    Gram Stain   Final    ABUNDANT  WBC PRESENT, PREDOMINANTLY PMN MODERATE GRAM POSITIVE COCCI Performed at [facility], [address], [place], [place] [address]    Culture   Final    ABUNDANT STAPHYLOCOCCUS AUREUS NO ANAEROBES ISOLATED; CULTURE IN PROGRESS FOR 5 DAYS    Report Status PENDING  Incomplete   Organism ID, Bacteria STAPHYLOCOCCUS AUREUS  Final      Susceptibility   Staphylococcus aureus - MIC*    CIPROFLOXACIN <=0.5 SENSITIVE Sensitive     ERYTHROMYCIN >=8 RESISTANT Resistant     GENTAMICIN <=0.5 SENSITIVE Sensitive     OXACILLIN 0.5 SENSITIVE Sensitive     TETRACYCLINE <=1 SENSITIVE Sensitive     VANCOMYCIN 1 SENSITIVE Sensitive     TRIMETH/SULFA <=10 SENSITIVE Sensitive     CLINDAMYCIN RESISTANT Resistant     RIFAMPIN <=0.5 SENSITIVE Sensitive     Inducible Clindamycin POSITIVE Resistant     LINEZOLID 2 SENSITIVE Sensitive     * ABUNDANT STAPHYLOCOCCUS AUREUS  Surgical pcr screen     Status: None   Collection Time: 02/19/24  6:36 PM   Specimen: Nasal Mucosa; Nasal Swab  Result Value Ref Range Status   MRSA, PCR NEGATIVE NEGATIVE Final   Staphylococcus aureus NEGATIVE NEGATIVE Final    Comment: (NOTE) The Xpert SA Assay (FDA approved for NASAL specimens in patients 67 years of age and older), is one component of a comprehensive surveillance program. It is not intended to diagnose infection nor to guide or monitor treatment. Performed at [facility], [address], [place], [place] [address]   Aerobic/Anaerobic Culture w Gram Stain (surgical/deep wound)     Status: None (Preliminary result)   Collection Time: 02/20/24  9:43 AM   Specimen: Soft Tissue, Other  Result Value Ref Range Status   Specimen Description TISSUE  Final   Special Requests SOURCE A  Final   Gram Stain   Final    FEW WBC PRESENT, PREDOMINANTLY PMN NO ORGANISMS SEEN    Culture   Final    FEW STAPHYLOCOCCUS AUREUS SUSCEPTIBILITIES TO FOLLOW Performed at [facility], [address]  [place], [place] [address]    Report Status PENDING  Incomplete         Radiology Studies: No results found.      Scheduled Meds:  amLODipine  5 mg Oral Daily   ascorbic acid  500 mg  Oral BID   atorvastatin  10 mg Oral QHS   docusate sodium  100 mg Oral BID   enoxaparin (LOVENOX) injection  40 mg Subcutaneous Q24H   gabapentin  300 mg Oral QHS   hydrochlorothiazide  12.5 mg Oral Daily   insulin aspart  0-15 Units Subcutaneous TID WC   insulin aspart  0-5 Units Subcutaneous QHS   insulin glargine-yfgn  25 Units Subcutaneous QHS   leptospermum manuka honey  1 Application Topical Daily   losartan  50 mg Oral QHS   Ensure Max Protein  11 oz Oral Daily   sodium chloride flush  3-10 mL Intravenous Q12H   zinc sulfate (50mg  elemental zinc)  220 mg Oral Daily   Continuous Infusions:   ceFAZolin (ANCEF) IV 2 g (02/21/24 0200)     LOS: 7 days   Time spent:  Azucena Fallen, DO [facility]  If 7PM-7AM, please contact night-coverage [email or web address]  02/21/2024, 7:14 AM

## 2024-02-22 ENCOUNTER — Encounter (HOSPITAL_COMMUNITY)
Admission: EM | Disposition: A | Discharge status: Home / Self Care | Payer: Self-pay | Source: Home / Self Care | Attending: Internal Medicine

## 2024-02-22 ENCOUNTER — Encounter (HOSPITAL_COMMUNITY): Payer: Self-pay | Admitting: Internal Medicine

## 2024-02-22 ENCOUNTER — Inpatient Hospital Stay (HOSPITAL_COMMUNITY): Admitting: Anesthesiology

## 2024-02-22 ENCOUNTER — Other Ambulatory Visit: Payer: Self-pay

## 2024-02-22 DIAGNOSIS — L02415 Cutaneous abscess of right lower limb: Secondary | ICD-10-CM | POA: Diagnosis not present

## 2024-02-22 DIAGNOSIS — E119 Type 2 diabetes mellitus without complications: Secondary | ICD-10-CM

## 2024-02-22 DIAGNOSIS — Z794 Long term (current) use of insulin: Secondary | ICD-10-CM

## 2024-02-22 DIAGNOSIS — L03115 Cellulitis of right lower limb: Secondary | ICD-10-CM | POA: Diagnosis not present

## 2024-02-22 DIAGNOSIS — I10 Essential (primary) hypertension: Secondary | ICD-10-CM

## 2024-02-22 HISTORY — PX: INCISION AND DRAINAGE OF WOUND: SHX1803

## 2024-02-22 LAB — GLUCOSE, CAPILLARY
Glucose-Capillary: 205 mg/dL — ABNORMAL HIGH (ref 70–99)
Glucose-Capillary: 155 mg/dL — ABNORMAL HIGH (ref 70–99)
Glucose-Capillary: 151 mg/dL — ABNORMAL HIGH (ref 70–99)
Glucose-Capillary: 179 mg/dL — ABNORMAL HIGH (ref 70–99)
Glucose-Capillary: 236 mg/dL — ABNORMAL HIGH (ref 70–99)
Glucose-Capillary: 298 mg/dL — ABNORMAL HIGH (ref 70–99)

## 2024-02-22 SURGERY — IRRIGATION AND DEBRIDEMENT WOUND
Anesthesia: General | Laterality: Right

## 2024-02-22 MED ORDER — ONDANSETRON HCL 4 MG/2ML IJ SOLN
INTRAMUSCULAR | Status: DC | PRN
  Administered 2024-02-22: 4 mg via INTRAVENOUS

## 2024-02-22 MED ORDER — VASHE WOUND IRRIGATION OPTIME
TOPICAL | Status: DC | PRN
  Administered 2024-02-22: 34 [oz_av]

## 2024-02-22 MED ORDER — FENTANYL CITRATE (PF) 250 MCG/5ML IJ SOLN
INTRAMUSCULAR | Status: AC
  Filled 2024-02-22: qty 5

## 2024-02-22 MED ORDER — FENTANYL CITRATE (PF) 100 MCG/2ML IJ SOLN
INTRAMUSCULAR | Status: DC | PRN
  Administered 2024-02-22: 50 ug via INTRAVENOUS
  Administered 2024-02-22: 100 ug via INTRAVENOUS

## 2024-02-22 MED ORDER — LACTATED RINGERS IV SOLN: INTRAVENOUS | Status: DC | PRN

## 2024-02-22 MED ORDER — ALBUTEROL SULFATE HFA 108 (90 BASE) MCG/ACT IN AERS
INHALATION_SPRAY | RESPIRATORY_TRACT | Status: DC | PRN
  Administered 2024-02-22: 4 via RESPIRATORY_TRACT

## 2024-02-22 MED ORDER — DEXMEDETOMIDINE HCL IN NACL 80 MCG/20ML IV SOLN
INTRAVENOUS | Status: DC | PRN
  Administered 2024-02-22: 8 ug via INTRAVENOUS
  Administered 2024-02-22: 4 ug via INTRAVENOUS
  Administered 2024-02-22: 8 ug via INTRAVENOUS

## 2024-02-22 MED ORDER — POVIDONE-IODINE 10 % EX SWAB
2.0000 | Freq: Once | CUTANEOUS | Status: AC
  Administered 2024-02-22: 2 via TOPICAL

## 2024-02-22 MED ORDER — CHLORHEXIDINE GLUCONATE 4 % EX SOLN: 60.0000 mL | Freq: Once | CUTANEOUS | Status: DC

## 2024-02-22 MED ORDER — ORAL CARE MOUTH RINSE: 15.0000 mL | Freq: Once | OROMUCOSAL | Status: AC

## 2024-02-22 MED ORDER — SUCCINYLCHOLINE CHLORIDE 200 MG/10ML IV SOSY
PREFILLED_SYRINGE | INTRAVENOUS | Status: DC | PRN
  Administered 2024-02-22: 100 mg via INTRAVENOUS

## 2024-02-22 MED ORDER — MIDAZOLAM HCL 2 MG/2ML IJ SOLN
INTRAMUSCULAR | Status: AC
Start: 2024-02-22 — End: ?
  Filled 2024-02-22: qty 2

## 2024-02-22 MED ORDER — PHENYLEPHRINE HCL (PRESSORS) 10 MG/ML IV SOLN
INTRAVENOUS | Status: DC | PRN
  Administered 2024-02-22 (×3): 80 ug via INTRAVENOUS

## 2024-02-22 MED ORDER — LACTATED RINGERS IV SOLN
INTRAVENOUS | Status: DC
Start: 2024-02-22 — End: 2024-02-22

## 2024-02-22 MED ORDER — CHLORHEXIDINE GLUCONATE 0.12 % MT SOLN
OROMUCOSAL | Status: AC
  Administered 2024-02-22: 15 mL via OROMUCOSAL
  Filled 2024-02-22: qty 15

## 2024-02-22 MED ORDER — MIDAZOLAM HCL 5 MG/5ML IJ SOLN
INTRAMUSCULAR | Status: DC | PRN
  Administered 2024-02-22: 2 mg via INTRAVENOUS

## 2024-02-22 MED ORDER — CHLORHEXIDINE GLUCONATE 0.12 % MT SOLN: 15.0000 mL | Freq: Once | OROMUCOSAL | Status: AC

## 2024-02-22 MED ORDER — PROPOFOL 10 MG/ML IV BOLUS
INTRAVENOUS | Status: AC
  Filled 2024-02-22: qty 20

## 2024-02-22 MED ORDER — 0.9 % SODIUM CHLORIDE (POUR BTL) OPTIME
TOPICAL | Status: DC | PRN
  Administered 2024-02-22: 1000 mL

## 2024-02-22 MED ORDER — PROPOFOL 10 MG/ML IV BOLUS
INTRAVENOUS | Status: DC | PRN
  Administered 2024-02-22: 200 mg via INTRAVENOUS

## 2024-02-22 SURGICAL SUPPLY — 44 items
BAG COUNTER SPONGE SURGICOUNT (BAG) IMPLANT
BLADE SURG 21 STRL SS (BLADE) ×2 IMPLANT
BNDG COHESIVE 4X5 TAN STRL LF (GAUZE/BANDAGES/DRESSINGS) IMPLANT
BNDG COHESIVE 6X5 TAN NS LF (GAUZE/BANDAGES/DRESSINGS) IMPLANT
BNDG COHESIVE 6X5 TAN ST LF (GAUZE/BANDAGES/DRESSINGS) IMPLANT
BNDG GAUZE DERMACEA FLUFF 4 (GAUZE/BANDAGES/DRESSINGS) IMPLANT
CANISTER WOUNDNEG PRESSURE 500 (CANNISTER) IMPLANT
CLEANSER WND VASHE 34 (WOUND CARE) IMPLANT
CLEANSER WND VASHE INSTL 34OZ (WOUND CARE) IMPLANT
COVER SURGICAL LIGHT HANDLE (MISCELLANEOUS) ×4 IMPLANT
DRAPE DERMATAC (DRAPES) IMPLANT
DRAPE INCISE IOBAN 66X45 STRL (DRAPES) IMPLANT
DRAPE U-SHAPE 47X51 STRL (DRAPES) ×2 IMPLANT
DRESSING PREVENA PLUS CUSTOM (GAUZE/BANDAGES/DRESSINGS) IMPLANT
DRESSING VERAFLO CLEANS CC MED (GAUZE/BANDAGES/DRESSINGS) IMPLANT
DRSG ADAPTIC 3X8 NADH LF (GAUZE/BANDAGES/DRESSINGS) ×2 IMPLANT
DRSG PREVENA PLUS CUSTOM (GAUZE/BANDAGES/DRESSINGS) ×1 IMPLANT
DRSG VERAFLO CLEANSE CC MED (GAUZE/BANDAGES/DRESSINGS) ×1 IMPLANT
DURAPREP 26ML APPLICATOR (WOUND CARE) ×2 IMPLANT
ELECT REM PT RETURN 9FT ADLT (ELECTROSURGICAL) IMPLANT
ELECTRODE REM PT RTRN 9FT ADLT (ELECTROSURGICAL) IMPLANT
GAUZE PAD ABD 8X10 STRL (GAUZE/BANDAGES/DRESSINGS) IMPLANT
GAUZE SPONGE 4X4 12PLY STRL (GAUZE/BANDAGES/DRESSINGS) IMPLANT
GLOVE BIOGEL PI IND STRL 9 (GLOVE) ×2 IMPLANT
GLOVE SURG ORTHO 9.0 STRL STRW (GLOVE) ×2 IMPLANT
GOWN STRL REUS W/ TWL XL LVL3 (GOWN DISPOSABLE) ×4 IMPLANT
GRAFT SKIN WND MICRO 38 (Tissue) IMPLANT
GRAFT SKIN WND OMEGA3 SB 7X10 (Tissue) IMPLANT
KIT BASIN OR (CUSTOM PROCEDURE TRAY) ×2 IMPLANT
KIT TURNOVER KIT B (KITS) ×2 IMPLANT
MANIFOLD NEPTUNE II (INSTRUMENTS) ×2 IMPLANT
NS IRRIG 1000ML POUR BTL (IV SOLUTION) ×2 IMPLANT
PACK ORTHO EXTREMITY (CUSTOM PROCEDURE TRAY) ×2 IMPLANT
PAD ARMBOARD POSITIONER FOAM (MISCELLANEOUS) ×4 IMPLANT
PAD NEG PRESSURE SENSATRAC (MISCELLANEOUS) IMPLANT
SET HNDPC FAN SPRY TIP SCT (DISPOSABLE) IMPLANT
SPONGE T-LAP 18X18 ~~LOC~~+RFID (SPONGE) IMPLANT
STOCKINETTE IMPERVIOUS 9X36 MD (GAUZE/BANDAGES/DRESSINGS) IMPLANT
SUT ETHILON 2 0 PSLX (SUTURE) ×2 IMPLANT
SWAB COLLECTION DEVICE MRSA (MISCELLANEOUS) ×2 IMPLANT
SWAB CULTURE ESWAB REG 1ML (MISCELLANEOUS) IMPLANT
TOWEL GREEN STERILE (TOWEL DISPOSABLE) ×2 IMPLANT
TUBE CONNECTING 12X1/4 (SUCTIONS) ×2 IMPLANT
YANKAUER SUCT BULB TIP NO VENT (SUCTIONS) ×2 IMPLANT

## 2024-02-22 NOTE — Transfer of Care (Signed)
 Immediate Anesthesia Transfer of Care Note  Patient: Ian Hughes  Procedure(s) Performed: IRRIGATION AND DEBRIDEMENT WOUND (Right)  Patient Location: PACU  Anesthesia Type:General  Level of Consciousness: awake, alert , oriented, and patient cooperative  Airway & Oxygen Therapy: Patient Spontanous Breathing and Patient connected to face mask oxygen  Post-op Assessment: Report given to RN, Post -op Vital signs reviewed and stable, Patient moving all extremities, Patient moving all extremities X 4, and Patient able to stick tongue midline  Post vital signs: Reviewed and stable  Last Vitals:  Vitals Value Taken Time  BP 117/67 02/22/24 1223  Temp    Pulse 87 02/22/24 1228  Resp 18 02/22/24 1228  SpO2 99 % 02/22/24 1228  Vitals shown include unfiled device data.  Last Pain:  Vitals:   02/22/24 0943  TempSrc:   PainSc: 0-No pain      Patients Stated Pain Goal: 3 (02/22/24 0225)  Complications: No notable events documented.

## 2024-02-22 NOTE — Progress Notes (Signed)
 PROGRESS NOTE    MAXIMILIEN HAYASHI  UJW:[id number] DOB: Sep 10, 1981 DOA: 02/13/2024 PCP: Rema Fendt, NP   Brief Narrative:  Ian Hughes is a 43 y.o. male with past medical history significant for HTN, HLD, DM2, obesity who presented to [facility] ED on 3/26 with worsening wound to right lower extremity with increased swelling, and drainage after scraping his leg on a pallet at work 3 days prior to presentation.  Patient was found to have diffuse soft tissue swelling of the right tibia and fibula initiated on vancomycin and cefepime, hospitalist called for admission.  Assessment & Plan:   Principal Problem:   Cellulitis of right lower leg Active Problems:   Insulin dependent type 2 diabetes mellitus (HCC)   AKI (acute kidney injury) (HCC)   Abscess of right lower leg   Infected abrasion of right leg   Right lower extremity wound/cellulitis with abscess/phlegmon, POA Sepsis secondary to lower extremity wound/cellulitis/abscess, POA Left lower extremity wound Sepsis criteria met with low-grade fever, leukocytosis and notable source as above Imaging confirmed superficial soft tissue wound/defect lateral aspect proximal mid calf with 5.3 x 2.8 x 3.6 cm intramuscular phlegmon versus developing abscess soleus muscle Orthopedics was consulted -surgical debridement 02/20/24, tolerated well-plan for further debridement this afternoon, postoperative wound care over the next 24 hours with possible discharge home over the weekend IR completed aspiration on 02/16/2024: aspiration culture: MSSA Continue IV Ancef   Type 2 diabetes mellitus, with hyperglycemia, uncontrolled Hemoglobin A1c 12.8 on 01/08/2024 SSI, Semglee, Accu-Cheks, hypoglycemic protocol Home regimen includes insulin glargine 13 units subcutaneously daily, metformin 1000 mg p.o. twice daily Currently borderline hyperglycemia, avoid tight control given n.p.o. status perioperatively   Essential hypertension Continue  amlodipine, Losartan and HCTZ    Hyperlipidemia Atorvastatin 10 mg p.o. daily   Obesity, class II Body mass index is 35.73 kg/m.  DVT prophylaxis: SCDs Start: 02/20/24 1054 enoxaparin (LOVENOX) injection 40 mg Start: 02/14/24 1800   Code Status:   Code Status: Full Code  Family Communication: At bedside, mother  Status is: Inpatient  Dispo: The patient is from: Home              Anticipated d/c is to: Home              Anticipated d/c date is: 48 to 72 hours              Patient currently not medically stable for discharge given he requires reevaluation and repeat surgical intervention on 02/22/24  Consultants:  Orthopedic surgery  Procedures:  Surgical debridement and aspiration as above  Antimicrobials:  Cefazolin  Subjective: No acute issues or events overnight, tolerated surgical procedure well otherwise denies nausea vomiting diarrhea constipation headache fevers chills chest pain  Objective: Vitals:   02/21/24 0730 02/21/24 1552 02/21/24 1946 02/22/24 0431  BP: 113/70 134/76 (!) 145/79 (!) 145/87  Pulse: 86 84 86 83  Resp: 18 18 20 20   Temp: 98.8 F (37.1 C) 98.9 F (37.2 C) 99 F (37.2 C) 98.7 F (37.1 C)  TempSrc: Oral  Oral Oral  SpO2: 99% 97% 100% 98%  Weight:      Height:        Intake/Output Summary (Last 24 hours) at 02/22/2024 0555 Last data filed at 02/22/2024 0223 Gross per 24 hour  Intake 474 ml  Output 850 ml  Net -376 ml   Filed Weights   02/13/24 2302  Weight: 112.9 kg    Examination:  General:  Pleasantly resting  in bed, No acute distress. HEENT:  Normocephalic atraumatic.  Sclerae nonicteric, noninjected.  Extraocular movements intact bilaterally. Neck:  Without mass or deformity.  Trachea is midline. Lungs:  Clear to auscultate bilaterally without rhonchi, wheeze, or rales. Heart:  Regular rate and rhythm.  Without murmurs, rubs, or gallops. Abdomen:  Soft, nontender, nondistended.  Without guarding or rebound. Extremities:  Left lower extremity bandage clean dry intact  Data Reviewed: I have personally reviewed following labs and imaging studies  CBC: Recent Labs  Lab 02/17/24 0900 02/18/24 0747 02/19/24 0616 02/20/24 0545 02/21/24 0624  WBC 12.3* 10.1 9.4 8.1 11.4*  NEUTROABS  --   --   --  5.5  --   HGB 12.9* 12.3* 11.7* 11.9* 11.6*  HCT 40.3 36.4* 34.4* 35.3* 34.7*  MCV 84.8 80.4 80.0 80.8 82.0  PLT 383 392 399 400 403*   Basic Metabolic Panel: Recent Labs  Lab 02/16/24 0737 02/17/24 0900 02/18/24 0747 02/19/24 0616 02/20/24 0545 02/21/24 0624  NA 135 134* 135 136 134* 134*  K 3.5 3.8 3.8 3.9 3.8 4.2  CL 101 99 101 101 102 100  CO2 22 24 24 25 25 25   GLUCOSE 189* 223* 255* 207* 252* 268*  BUN 11 14 15 14 14 14   CREATININE 0.87 0.84 0.88 0.85 0.72 0.99  CALCIUM 8.6* 8.8* 8.8* 8.9 8.7* 8.6*  MG 2.0  --   --   --   --   --    GFR: Estimated Creatinine Clearance: 122.4 mL/min (by C-G formula based on SCr of 0.99 mg/dL). Liver Function Tests: No results for input(s): "AST", "ALT", "ALKPHOS", "BILITOT", "PROT", "ALBUMIN" in the last 168 hours.  No results for input(s): "LIPASE", "AMYLASE" in the last 168 hours. No results for input(s): "AMMONIA" in the last 168 hours. Coagulation Profile: No results for input(s): "INR", "PROTIME" in the last 168 hours. Cardiac Enzymes: No results for input(s): "CKTOTAL", "CKMB", "CKMBINDEX", "TROPONINI" in the last 168 hours. BNP (last 3 results) No results for input(s): "PROBNP" in the last 8760 hours. HbA1C: No results for input(s): "HGBA1C" in the last 72 hours. CBG: Recent Labs  Lab 02/20/24 2132 02/21/24 0601 02/21/24 1155 02/21/24 1549 02/21/24 2137  GLUCAP 395* 247* 292* 210* 210*   Lipid Profile: No results for input(s): "CHOL", "HDL", "LDLCALC", "TRIG", "CHOLHDL", "LDLDIRECT" in the last 72 hours. Thyroid Function Tests: No results for input(s): "TSH", "T4TOTAL", "FREET4", "T3FREE", "THYROIDAB" in the last 72 hours. Anemia  Panel: No results for input(s): "VITAMINB12", "FOLATE", "FERRITIN", "TIBC", "IRON", "RETICCTPCT" in the last 72 hours. Sepsis Labs: No results for input(s): "PROCALCITON", "LATICACIDVEN" in the last 168 hours.   Recent Results (from the past 240 hours)  Culture, blood (Routine X 2) w Reflex to ID Panel     Status: None   Collection Time: 02/14/24  1:00 AM   Specimen: BLOOD LEFT ARM  Result Value Ref Range Status   Specimen Description   Final    BLOOD LEFT ARM Performed at [facility], [address], [place], [place] [address]    Special Requests   Final    BOTTLES DRAWN AEROBIC AND ANAEROBIC Blood Culture adequate volume Performed at [facility], [address], [place], [place] [address]    Culture   Final    NO GROWTH 5 DAYS Performed at [facility], [address], [place], [place] [address]    Report Status 02/19/2024 FINAL  Final  Culture, blood (Routine X 2) w Reflex to ID Panel  Status: None   Collection Time: 02/14/24  1:15 AM   Specimen: BLOOD RIGHT ARM  Result Value Ref Range Status   Specimen Description   Final    BLOOD RIGHT ARM Performed at [facility], [address], [place], [place] [address]    Special Requests   Final    BOTTLES DRAWN AEROBIC AND ANAEROBIC Blood Culture adequate volume Performed at [facility], [address], [place], [place] [address]    Culture   Final    NO GROWTH 5 DAYS Performed at [facility], [address], [place], [place] [address]    Report Status 02/19/2024 FINAL  Final  Aerobic/Anaerobic Culture w Gram Stain (surgical/deep wound)     Status: None   Collection Time: 02/16/24  1:29 PM   Specimen: Abscess  Result Value Ref Range Status   Specimen Description   Final    ABSCESS Performed at [facility], [address], [place], [place] [address]    Special Requests   Final    NONE Performed at [facility], [address], [place], [place] [address]    Gram Stain   Final    ABUNDANT WBC PRESENT, PREDOMINANTLY PMN MODERATE GRAM POSITIVE COCCI    Culture   Final    ABUNDANT STAPHYLOCOCCUS AUREUS NO ANAEROBES ISOLATED Performed at [facility], [address], [place], [place] [address]    Report Status 02/21/2024 FINAL  Final   Organism ID, Bacteria STAPHYLOCOCCUS AUREUS  Final      Susceptibility   Staphylococcus aureus - MIC*    CIPROFLOXACIN <=0.5 SENSITIVE Sensitive     ERYTHROMYCIN >=8 RESISTANT Resistant     GENTAMICIN <=0.5 SENSITIVE Sensitive     OXACILLIN 0.5 SENSITIVE Sensitive     TETRACYCLINE <=1 SENSITIVE Sensitive     VANCOMYCIN 1 SENSITIVE Sensitive     TRIMETH/SULFA <=10 SENSITIVE Sensitive     CLINDAMYCIN RESISTANT Resistant     RIFAMPIN <=0.5 SENSITIVE Sensitive     Inducible Clindamycin POSITIVE Resistant     LINEZOLID 2 SENSITIVE Sensitive     * ABUNDANT STAPHYLOCOCCUS AUREUS  Surgical pcr screen     Status: None   Collection Time: 02/19/24  6:36 PM   Specimen: Nasal Mucosa; Nasal Swab  Result Value Ref Range Status   MRSA, PCR NEGATIVE NEGATIVE Final   Staphylococcus aureus NEGATIVE NEGATIVE Final    Comment: (NOTE) The Xpert SA Assay (FDA approved for NASAL specimens in patients 12 years of age and older), is one component of a comprehensive surveillance program. It is not intended to diagnose infection nor to guide or monitor treatment. Performed at [facility], [address] [place], [place] [address]   Aerobic/Anaerobic Culture w Gram Stain (surgical/deep wound)     Status: None (Preliminary result)   Collection Time: 02/20/24  9:43 AM   Specimen: Soft Tissue, Other  Result Value Ref Range Status   Specimen Description TISSUE  Final   Special Requests SOURCE A  Final   Gram Stain   Final    FEW WBC PRESENT, PREDOMINANTLY PMN NO ORGANISMS SEEN    Culture   Final    FEW STAPHYLOCOCCUS  AUREUS SUSCEPTIBILITIES TO FOLLOW Performed at [facility], [address] [place], [place] [address]    Report Status PENDING  Incomplete         Radiology Studies: No results found.      Scheduled Meds:  amLODipine  5 mg Oral Daily   ascorbic acid  500 mg Oral BID   atorvastatin  10 mg Oral QHS   docusate sodium  100 mg Oral BID   enoxaparin (LOVENOX) injection  40 mg Subcutaneous Q24H   gabapentin  300 mg Oral QHS   hydrochlorothiazide  12.5 mg Oral Daily   insulin aspart  0-15 Units Subcutaneous TID WC   insulin aspart  0-5 Units Subcutaneous QHS   insulin glargine-yfgn  25 Units Subcutaneous QHS   leptospermum manuka honey  1 Application Topical Daily   losartan  50 mg Oral QHS   Ensure Max Protein  11 oz Oral Daily   sodium chloride flush  3-10 mL Intravenous Q12H   zinc sulfate (50mg  elemental zinc)  220 mg Oral Daily   Continuous Infusions:   ceFAZolin (ANCEF) IV 2 g (02/22/24 0223)     LOS: 8 days   Time spent:  Azucena Fallen, DO [facility]  If 7PM-7AM, please contact night-coverage [email or web address]  02/22/2024, 5:55 AM

## 2024-02-22 NOTE — Anesthesia Postprocedure Evaluation (Signed)
 Anesthesia Post Note  Patient: Ian Hughes  Procedure(s) Performed: IRRIGATION AND DEBRIDEMENT WOUND (Right)     Patient location during evaluation: PACU Anesthesia Type: General Level of consciousness: awake and alert Pain management: pain level controlled Vital Signs Assessment: post-procedure vital signs reviewed and stable Respiratory status: spontaneous breathing, nonlabored ventilation, respiratory function stable and patient connected to nasal cannula oxygen Cardiovascular status: blood pressure returned to baseline and stable Postop Assessment: no apparent nausea or vomiting Anesthetic complications: no   No notable events documented.  Last Vitals:  Vitals:   02/22/24 1300 02/22/24 1331  BP: 131/75 132/73  Pulse: 87 88  Resp: 17 19  Temp: 36.7 C 36.4 C  SpO2: 96% 97%    Last Pain:  Vitals:   02/22/24 1338  TempSrc:   PainSc: 8                  Nylia Gavina P Diaz Crago

## 2024-02-22 NOTE — Interval H&P Note (Signed)
 History and Physical Interval Note:  02/22/2024 8:38 AM  Ian Hughes  has presented today for surgery, with the diagnosis of right leg abscess.  The various methods of treatment have been discussed with the patient and family. After consideration of risks, benefits and other options for treatment, the patient has consented to  Procedure(s) with comments: IRRIGATION AND DEBRIDEMENT WOUND (Right) - IRRIGATION AND DEBRIDEMENT RIGHT LEG as a surgical intervention.  The patient's history has been reviewed, patient examined, no change in status, stable for surgery.  I have reviewed the patient's chart and labs.  Questions were answered to the patient's satisfaction.     Nadara Mustard

## 2024-02-22 NOTE — Op Note (Signed)
 02/22/2024  12:42 PM  PATIENT:  Ian Hughes    PRE-OPERATIVE DIAGNOSIS:  right leg abscess  POST-OPERATIVE DIAGNOSIS:  Same  PROCEDURE: Excisional debridement right calf with excision skin soft tissue muscle and fascia. Application Kerecis micro graft 95 cm and Kerecis sheet 7 x 10 cm to cover a wound surface area 25 x 10 cm. Local tissue transfer for wound closure 25 x 10 cm. Application of cleanse choice wound VAC sponge and customizable wound VAC sponge.  SURGEON:  Nadara Mustard, MD  PHYSICIAN ASSISTANT:None ANESTHESIA:   General  PREOPERATIVE INDICATIONS:  Ian Hughes is a  43 y.o. male with a diagnosis of right leg abscess who failed conservative measures and elected for surgical management.    The risks benefits and alternatives were discussed with the patient preoperatively including but not limited to the risks of infection, bleeding, nerve injury, cardiopulmonary complications, the need for revision surgery, among others, and the patient was willing to proceed.  OPERATIVE IMPLANTS:   Implant Name Type Inv. Item Serial No. Manufacturer Lot No. LRB No. Used Action  GRAFT SKIN WND MICRO 38 - [id number] Tissue GRAFT SKIN WND MICRO 38  [employer] [id number] Right 1 Implanted  GRAFT SKIN WND OMEGA3 SB 7X10 - [id number] Tissue GRAFT SKIN WND OMEGA3 SB 7X10  [employer] [id number] Right 1 Implanted    @ENCIMAGES @  OPERATIVE FINDINGS: Patient had further nonviable muscle in the posterior compartment including both the lateral gastrocnemius muscle and anterior compartment involving superficial muscles in the anterior compartment.  OPERATIVE PROCEDURE: Patient brought the operating room and underwent a general anesthetic.  After adequate levels anesthesia were obtained patient's right lower extremity was prepped using DuraPrep draped into a sterile field a timeout was called.  Elliptical incision was made around the wound to excise nonviable skin and soft tissue.  This left  a wound that was 25 x 10 cm.  There was further noncontractile muscle of the lateral gastrocnemius muscle and this was excised back to bleeding viable muscle.  Electrocautery was used for hemostasis.  There is further nonviable muscle in the anterior compartment and this was also resected.  Electrocautery was used hemostasis.  The wound was irrigated with Vashe.  The wound bed was filled with 95 cm of Kerecis micro graft and a 7 x 10 Kerecis sheet to cover a wound surface area of 250 cm.  The tissue edges were undermined and local tissue transfer was performed to close the wound 25 x 10 cm.  The cleanse choice sponge was applied to the 2 open wounds this was covered with the customizable sponge this was secured and had a good suction fit and was overwrapped with Coban.  Patient was extubated taken the PACU in stable condition.   DISCHARGE PLANNING:  Antibiotic duration: Continue IV antibiotics with anticipated discharge with oral antibiotics  Weightbearing: Weightbearing as tolerated  Pain medication: Continue opioid pathway  Dressing care/ Wound VAC: Continue wound VAC plan for discharge on the Prevena plus portable wound VAC pump.  Ambulatory devices: Walker or crutches  Discharge to: Discharge planning based on therapy recommendations.  Follow-up: In the office 1 week post operative.

## 2024-02-22 NOTE — Plan of Care (Signed)
  Problem: Coping: Goal: Ability to adjust to condition or change in health will improve Outcome: Progressing   Problem: Skin Integrity: Goal: Risk for impaired skin integrity will decrease Outcome: Progressing   Problem: Tissue Perfusion: Goal: Adequacy of tissue perfusion will improve Outcome: Progressing   Problem: Education: Goal: Knowledge of General Education information will improve Description: Including pain rating scale, medication(s)/side effects and non-pharmacologic comfort measures Outcome: Progressing   Problem: Activity: Goal: Risk for activity intolerance will decrease Outcome: Progressing   Problem: Coping: Goal: Level of anxiety will decrease Outcome: Progressing   Problem: Elimination: Goal: Will not experience complications related to bowel motility Outcome: Progressing Goal: Will not experience complications related to urinary retention Outcome: Progressing   Problem: Pain Managment: Goal: General experience of comfort will improve and/or be controlled Outcome: Progressing

## 2024-02-22 NOTE — Anesthesia Procedure Notes (Signed)
 Procedure Name: Intubation Date/Time: 02/22/2024 11:00 AM  Performed by: Venia Carbon, CRNAPre-anesthesia Checklist: Patient identified, Emergency Drugs available, Suction available, Patient being monitored and Timeout performed Patient Re-evaluated:Patient Re-evaluated prior to induction Oxygen Delivery Method: Circle system utilized Preoxygenation: Pre-oxygenation with 100% oxygen Induction Type: IV induction Ventilation: Mask ventilation without difficulty Laryngoscope Size: Mac and 3 Grade View: Grade II Tube size: 7.5 mm Number of attempts: 1 Airway Equipment and Method: Stylet and Patient positioned with wedge pillow Placement Confirmation: ETT inserted through vocal cords under direct vision, positive ETCO2, CO2 detector and breath sounds checked- equal and bilateral Secured at: 23 cm Tube secured with: Tape Comments: LMA 5 inserted but did not seat appropriately, Proseal LMA 4 attempted but unable to get adequate tidal volumes. ETT 7.0 inserted with a MAC 3 easily.

## 2024-02-22 NOTE — Anesthesia Preprocedure Evaluation (Addendum)
 Anesthesia Evaluation  Patient identified by MRN, date of birth, ID band Patient awake    Reviewed: Allergy & Precautions, NPO status , Patient's Chart, lab work & pertinent test results  Airway Mallampati: II  TM Distance: >3 FB Neck ROM: Full    Dental no notable dental hx.    Pulmonary neg pulmonary ROS   Pulmonary exam normal        Cardiovascular hypertension, Pt. on medications  Rhythm:Regular Rate:Normal     Neuro/Psych  Headaches  negative psych ROS   GI/Hepatic negative GI ROS, Neg liver ROS,,,  Endo/Other  diabetes, Type 2, Insulin Dependent, Oral Hypoglycemic Agents    Renal/GU   negative genitourinary   Musculoskeletal Leg abscess   Abdominal Normal abdominal exam  (+)   Peds  Hematology   Anesthesia Other Findings   Reproductive/Obstetrics                             Anesthesia Physical Anesthesia Plan  ASA: 3  Anesthesia Plan: General   Post-op Pain Management:    Induction: Intravenous  PONV Risk Score and Plan: 2 and Ondansetron, Dexamethasone, Midazolam and Treatment may vary due to age or medical condition  Airway Management Planned: Mask and LMA  Additional Equipment: None  Intra-op Plan:   Post-operative Plan: Extubation in OR  Informed Consent: I have reviewed the patients History and Physical, chart, labs and discussed the procedure including the risks, benefits and alternatives for the proposed anesthesia with the patient or authorized representative who has indicated his/her understanding and acceptance.     Dental advisory given  Plan Discussed with: CRNA  Anesthesia Plan Comments:        Anesthesia Quick Evaluation

## 2024-02-22 NOTE — Progress Notes (Signed)
 PT Cancellation Note  Patient Details Name: Ian Hughes MRN: [id number] DOB: [birth year]   Cancelled Treatment:    Reason Eval/Treat Not Completed: Other (comment) (Pt declined practicing stairs this am as OR has called and is coming to get pt for surgery and nurse in room getting pt ready.)   Bevelyn Buckles 02/22/2024, 9:07 AM [place] Winslow M,PT Acute Rehab Services [phone]

## 2024-02-23 ENCOUNTER — Other Ambulatory Visit (HOSPITAL_COMMUNITY): Payer: Self-pay

## 2024-02-23 ENCOUNTER — Other Ambulatory Visit: Payer: Self-pay

## 2024-02-23 DIAGNOSIS — L03115 Cellulitis of right lower limb: Secondary | ICD-10-CM | POA: Diagnosis not present

## 2024-02-23 LAB — CBC
HCT: 31.1 % — ABNORMAL LOW (ref 39.0–52.0)
Hemoglobin: 10.1 g/dL — ABNORMAL LOW (ref 13.0–17.0)
MCH: 26.8 pg (ref 26.0–34.0)
MCHC: 32.5 g/dL (ref 30.0–36.0)
MCV: 82.5 fL (ref 80.0–100.0)
Platelets: 370 10*3/uL (ref 150–400)
RBC: 3.77 MIL/uL — ABNORMAL LOW (ref 4.22–5.81)
RDW: 13.3 % (ref 11.5–15.5)
WBC: 10.7 10*3/uL — ABNORMAL HIGH (ref 4.0–10.5)
nRBC: 0 % (ref 0.0–0.2)

## 2024-02-23 LAB — BASIC METABOLIC PANEL WITH GFR
Anion gap: 11 (ref 5–15)
BUN: 13 mg/dL (ref 6–20)
CO2: 24 mmol/L (ref 22–32)
Calcium: 8.4 mg/dL — ABNORMAL LOW (ref 8.9–10.3)
Chloride: 98 mmol/L (ref 98–111)
Creatinine, Ser: 0.83 mg/dL (ref 0.61–1.24)
GFR, Estimated: >60 mL/min (ref >60)
Glucose, Bld: 320 mg/dL — ABNORMAL HIGH (ref 70–99)
Potassium: 4 mmol/L (ref 3.5–5.1)
Sodium: 133 mmol/L — ABNORMAL LOW (ref 135–145)

## 2024-02-23 LAB — GLUCOSE, CAPILLARY: Glucose-Capillary: 308 mg/dL — ABNORMAL HIGH (ref 70–99)

## 2024-02-23 MED ORDER — DOXYCYCLINE HYCLATE 100 MG PO TABS
100.0000 mg | ORAL_TABLET | Freq: Two times a day (BID) | ORAL | 0 refills | Status: DC
  Filled 2024-02-23: qty 60, 30d supply, fill #0

## 2024-02-23 MED ORDER — OXYCODONE-ACETAMINOPHEN 5-325 MG PO TABS
1.0000 | ORAL_TABLET | ORAL | 0 refills | Status: DC | PRN
  Filled 2024-02-23: qty 30, 5d supply, fill #0

## 2024-02-23 NOTE — Discharge Summary (Signed)
 Physician Discharge Summary  Ian Hughes [name]:[id number] DOB: Aug 18, 1981 DOA: 02/13/2024  PCP: Rema Fendt, NP  Admit date: 02/13/2024 Discharge date: 02/23/2024  Admitted From: Home Disposition: Home  Recommendations for Outpatient Follow-up:  Follow up with PCP in 1-2 weeks Follow-up with orthopedics Dr. Lajoyce Corners as scheduled in the next 1 to 2 weeks, continue wound VAC as discussed:  Home Health: None Equipment/Devices: Rolling walker  Discharge Condition: Stable CODE STATUS: Full Diet recommendation: Low-salt low-fat low-carb diet  Brief/Interim Summary: Ian Hughes is a 43 y.o. male with past medical history significant for HTN, HLD, DM2, obesity who presented to [facility] ED on 3/26 with worsening wound to right lower extremity with increased swelling, and drainage after scraping his leg on a pallet at work 3 days prior to presentation.  Patient was found to have diffuse soft tissue swelling of the right tibia and fibula initiated on vancomycin and cefepime, hospitalist called for admission.  Patient admitted as above, evaluated by orthopedic surgery status post I&D of right lower extremity with soft tissue excision of the muscle and fascia per orthopedic surgery.  Patient has tolerated second debridement on 4/4 quite well without complication.  Patient continues in wound VAC, per orthopedics okay to discharge today on home wound VAC, continue doxycycline as discussed until completed.  Patient otherwise stable and agreeable for discharge home with close follow-up with PCP and orthopedic surgery in the next few weeks.  Patient's A1c previously had a markedly elevated at 12.8, would recommend to resume home regimen as well as to strictly monitor diabetic diet.  If patient continues to have profoundly uncontrolled glucose he remains at high risk for repeat infections as well as loss of limb and other complications which we discussed thoroughly at bedside.   Discharge  Diagnoses:  Principal Problem:   Cellulitis of right lower leg Active Problems:   Insulin dependent type 2 diabetes mellitus (HCC)   AKI (acute kidney injury) (HCC)   Abscess of right lower leg   Infected abrasion of right leg  Right lower extremity wound/cellulitis with abscess/phlegmon, POA Sepsis secondary to lower extremity wound/cellulitis/abscess, POA Left lower extremity wound Status post surgical debridement 02/20/24 and 02/22/24 -transition to doxycycline   Type 2 diabetes mellitus, with hyperglycemia, uncontrolled Hemoglobin A1c 12.8 on 01/08/2024 Resume home regimen includes insulin glargine 13 units subcutaneously daily, metformin 1000 mg p.o. twice daily   Essential hypertension Amlodipine, Losartan and HCTZ    Hyperlipidemia Atorvastatin 10 mg p.o. daily   Obesity, class II Body mass index is 35.73 kg/m.  Discharge Instructions  Discharge Instructions     AMB Referral VBCI Care Management   Complete by: As directed    A1c 14%   Expected date of contact: Emergent - 3 Days   Service: Pharmacy   Pharmacy Service For: Disease Management   Disease states to manage: Diabetes   Call MD / Call 911   Complete by: As directed    If you experience chest pain or shortness of breath, CALL 911 and be transported to the hospital emergency room.  If you develope a fever above 101 F, pus (white drainage) or increased drainage or redness at the wound, or calf pain, call your surgeon's office.   Constipation Prevention   Complete by: As directed    Drink plenty of fluids.  Prune juice may be helpful.  You may use a stool softener, such as Colace (over the counter) 100 mg twice a day.  Use MiraLax (over the  counter) for constipation as needed.   Diet - low sodium heart healthy   Complete by: As directed    Elevate operative extremity   Complete by: As directed    For home use only DME Crutches   Complete by: As directed    Increase activity slowly as tolerated   Complete by:  As directed    Negative Pressure Wound Therapy - Incisional   Complete by: As directed    Attached the wound VAC dressing to a Prevena plus portable wound VAC pump for discharge.   Post-operative opioid taper instructions:   Complete by: As directed    POST-OPERATIVE OPIOID TAPER INSTRUCTIONS: It is important to wean off of your opioid medication as soon as possible. If you do not need pain medication after your surgery it is ok to stop day one. Opioids include: Codeine, Hydrocodone(Norco, Vicodin), Oxycodone(Percocet, oxycontin) and hydromorphone amongst others.  Long term and even short term use of opiods can cause: Increased pain response Dependence Constipation Depression Respiratory depression And more.  Withdrawal symptoms can include Flu like symptoms Nausea, vomiting And more Techniques to manage these symptoms Hydrate well Eat regular healthy meals Stay active Use relaxation techniques(deep breathing, meditating, yoga) Do Not substitute Alcohol to help with tapering If you have been on opioids for less than two weeks and do not have pain than it is ok to stop all together.  Plan to wean off of opioids This plan should start within one week post op of your joint replacement. Maintain the same interval or time between taking each dose and first decrease the dose.  Cut the total daily intake of opioids by one tablet each day Next start to increase the time between doses. The last dose that should be eliminated is the evening dose.         Allergies as of 02/23/2024   No Known Allergies      Medication List     STOP taking these medications    methocarbamol 500 MG tablet Commonly known as: ROBAXIN       TAKE these medications    acetaminophen 500 MG tablet Commonly known as: TYLENOL Take 500 mg by mouth every 6 (six) hours as needed.   amLODipine 5 MG tablet Commonly known as: NORVASC Take 5 mg by mouth daily.   atorvastatin 10 MG tablet Commonly  known as: LIPITOR Take 1 tablet (10 mg total) by mouth daily.   Basaglar KwikPen 100 UNIT/ML Inject 13 Units into the skin at bedtime.   doxycycline 100 MG tablet Commonly known as: VIBRA-TABS Take 1 tablet (100 mg total) by mouth 2 (two) times daily.   FreeStyle Libre 2 Reader [place] Use to check blood sugar continuously throughout the day. E11.42   FreeStyle Libre 2 Sensor Misc Use to check blood sugar continuously throughout the day. Change sensors once every 14 days. E11.42   gabapentin 300 MG capsule Commonly known as: NEURONTIN Take 1 capsule (300 mg total) by mouth at bedtime.   hydrochlorothiazide 12.5 MG tablet Commonly known as: HYDRODIURIL Take 1 tablet (12.5 mg total) by mouth daily.   Insulin Pen Needle 32G X 4 MM Misc 1 each by Other route daily. Use to inject insulin once daily.   losartan 50 MG tablet Commonly known as: COZAAR Take 1 tablet (50 mg total) by mouth daily.   metFORMIN 500 MG 24 hr tablet Commonly known as: GLUCOPHAGE-XR Take 2 tablets (1,000 mg total) by mouth 2 (two) times daily with a meal.  oxyCODONE-acetaminophen 5-325 MG tablet Commonly known as: PERCOCET/ROXICET Take 1 tablet by mouth every 4 (four) hours as needed.   TRUEplus Lancets 28G Misc Use to check blood sugar 2 times a day               Durable Medical Equipment  (From admission, onward)           Start     Ordered   02/23/24 0000  For home use only DME Crutches        02/23/24 5409            Follow-up Information     Nadara Mustard, MD Follow up in 1 week(s).   Specialty: Orthopedic Surgery Contact information: [address] [place] [address] [phone]                No Known Allergies  Consultations: Orthopedic surgery, Dr. Lajoyce Corners  Procedures/Studies: [place] IMAGE GUIDED DRAINAGE BY PERCUTANEOUS CATHETER Result Date: 02/16/2024 INDICATION: Soft tissue infection with a wound on the lateral aspect of the right calf. CT raised  concern for an intramuscular abscess. EXAM: ULTRASOUND-GUIDED ASPIRATION OF RIGHT CALF INTRAMUSCULAR ABSCESS MEDICATIONS: % lidocaine for local anesthetic ANESTHESIA/SEDATION: None COMPLICATIONS: None immediate. PROCEDURE: Informed written consent was obtained from the patient after a thorough discussion of the procedural risks, benefits and alternatives. All questions were addressed. A timeout was performed prior to the initiation of the procedure. Ultrasound was used to evaluate the lateral aspect of the right calf. Edematous muscle tissue was identified adjacent to the right fibula. This corresponded with the area of abnormality on recent CT. The overlying skin and wound were prepped with Betadine. Sterile field was created. Skin was anesthetized using 1% lidocaine. Using ultrasound guidance, a 20 gauge spinal needle was directed into the edematous muscle. Multiple hypoechoic areas were aspirated using ultrasound guidance. Total of 3 mL of bloody purulent fluid was aspirated. Bandage placed over the puncture site. FINDINGS: Superficial wound on the lateral aspect of the right calf. Beneath and just posterior to the wound, there is edematous muscular tissue corresponding with the area of concern. Needle was directed into multiple hypoechoic areas and small amount of thick purulent fluid was aspirated. IMPRESSION: Ultrasound-guided aspiration of purulent fluid from a complex intramuscular abscess. Electronically Signed   By: Richarda Overlie M.D.   On: 02/16/2024 14:39   CT TIBIA FIBULA RIGHT W CONTRAST Addendum Date: 02/16/2024 ADDENDUM REPORT: 02/16/2024 10:32 ADDENDUM: A laterality error is present in the Findings and Impressions sections of the report. Reference made to the soleus muscle should indicate the RIGHT soleus muscle (not left). The right lower extremity was imaged. No additional changes. Electronically Signed   By: Duanne Guess D.O.   On: 02/16/2024 10:32   Result Date: 02/16/2024 CLINICAL DATA:   Soft tissue infection suspected, lower leg, xray done EXAM: CT OF THE LOWER RIGHT EXTREMITY WITH CONTRAST TECHNIQUE: Multidetector CT imaging of the lower right extremity was performed according to the standard protocol following intravenous contrast administration. RADIATION DOSE REDUCTION: This exam was performed according to the departmental dose-optimization program which includes automated exposure control, adjustment of the mA and/or kV according to patient size and/or use of iterative reconstruction technique. CONTRAST:  OMNIPAQUE IOHEXOL 300 MG/ML  SOLN COMPARISON:  X-ray 02/13/2024 FINDINGS: Bones/Joint/Cartilage Right tibia and fibula are intact. No fracture or dislocation. Mature calcification along the distal tibiofibular syndesmosis compatible sequela of prior trauma. No erosion or aggressive periosteal elevation. No lytic or sclerotic bone lesion. Ligaments  Suboptimally assessed by CT. Muscles and Tendons Low-attenuation changes within the peripheral aspect of the left soleus muscle underlying the site of soft tissue ulceration the level of the mid calf. This area demonstrates partial rim enhancement and measures approximately 5.3 x 2.8 x 3.6 cm (series 6, image 147). Appearance favors intramuscular phlegmon/developing abscess. No additional intramuscular collections are identified. No significant tendinous abnormality by CT. Soft tissues Superficial skin wound or ulceration the lateral aspect of the proximal to mid calf. Circumferential subcutaneous edema. No fluid collections within the subcutaneous tissues. IMPRESSION: 1. Superficial skin wound or ulceration the lateral aspect of the proximal to mid calf. Low-attenuation changes within the peripheral aspect of the left soleus muscle underlying the site of soft tissue ulceration measuring approximately 5.3 x 2.8 x 3.6 cm. Appearance favors intramuscular phlegmon/developing abscess. 2. No acute osseous abnormality. Electronically Signed: By:  Duanne Guess D.O. On: 02/15/2024 13:24   DG Tibia/Fibula Right Result Date: 02/13/2024 CLINICAL DATA:  Injuries, skin infection. EXAM: RIGHT TIBIA AND FIBULA - 2 VIEW COMPARISON:  None Available. FINDINGS: There is no evidence of fracture or other focal bone lesions. There is diffuse soft tissue swelling of the lower extremity. No definite radiopaque foreign body identified. IMPRESSION: Diffuse soft tissue swelling of the lower extremity. No definite radiopaque foreign body identified. Electronically Signed   By: Darliss Cheney M.D.   On: 02/13/2024 23:32   VAS [place] LOWER EXTREMITY VENOUS (DVT) Result Date: 01/30/2024  Lower Venous DVT Study Patient Name:  KAMRON VANWYHE  Date of Exam:   01/30/2024 Medical Rec #: [id number]       Accession #:    [id number] Date of Birth: [birth year]        Patient Gender: M Patient Age:   89 years Exam Location:  [facility] Procedure:      VAS [place] LOWER EXTREMITY VENOUS (DVT) Referring Phys: AMY STEPHENS --------------------------------------------------------------------------------  Indications: Swelling, and Edema.  Comparison Study: No prior exam. Performing Technologist: Fernande Bras  Examination Guidelines: A complete evaluation includes B-mode imaging, spectral Doppler, color Doppler, and power Doppler as needed of all accessible portions of each vessel. Bilateral testing is considered an integral part of a complete examination. Limited examinations for reoccurring indications may be performed as noted. The reflux portion of the exam is performed with the patient in reverse Trendelenburg.  +---------+---------------+---------+-----------+----------+--------------+ RIGHT    CompressibilityPhasicitySpontaneityPropertiesThrombus Aging +---------+---------------+---------+-----------+----------+--------------+ CFV      Full           Yes      Yes                                  +---------+---------------+---------+-----------+----------+--------------+ SFJ      Full           Yes      Yes                                 +---------+---------------+---------+-----------+----------+--------------+ FV Prox  Full                                                        +---------+---------------+---------+-----------+----------+--------------+ FV Mid   Full                                                        +---------+---------------+---------+-----------+----------+--------------+  FV DistalFull                                                        +---------+---------------+---------+-----------+----------+--------------+ PFV      Full                                                        +---------+---------------+---------+-----------+----------+--------------+ POP      Full           Yes      Yes                                 +---------+---------------+---------+-----------+----------+--------------+ PTV      Full                                                        +---------+---------------+---------+-----------+----------+--------------+ PERO     Full                                                        +---------+---------------+---------+-----------+----------+--------------+   +---------+---------------+---------+-----------+----------+--------------+ LEFT     CompressibilityPhasicitySpontaneityPropertiesThrombus Aging +---------+---------------+---------+-----------+----------+--------------+ CFV      Full           Yes      Yes                                 +---------+---------------+---------+-----------+----------+--------------+ SFJ      Full           Yes      Yes                                 +---------+---------------+---------+-----------+----------+--------------+ FV Prox  Full                                                         +---------+---------------+---------+-----------+----------+--------------+ FV Mid   Full                                                        +---------+---------------+---------+-----------+----------+--------------+ FV DistalFull                                                        +---------+---------------+---------+-----------+----------+--------------+  PFV      Full                                                        +---------+---------------+---------+-----------+----------+--------------+ POP      Full           Yes      Yes                                 +---------+---------------+---------+-----------+----------+--------------+ PTV      Full                                                        +---------+---------------+---------+-----------+----------+--------------+ PERO     Full                                                        +---------+---------------+---------+-----------+----------+--------------+     Summary: BILATERAL: - No evidence of deep vein thrombosis seen in the lower extremities, bilaterally. -No evidence of popliteal cyst, bilaterally.   *See table(s) above for measurements and observations. Electronically signed by Coral Else MD on 01/30/2024 at 8:42:32 PM.    Final      Subjective: No acute issues or events overnight denies nausea vomiting diarrhea constipation fever chills chest pain.  Leg pain well-controlled   Discharge Exam: Vitals:   02/22/24 1932 02/23/24 0405  BP: 123/85 (!) 147/78  Pulse: 87 90  Resp: 16 16  Temp: 98.7 F (37.1 C) 99.1 F (37.3 C)  SpO2: 98% 97%   Vitals:   02/22/24 1300 02/22/24 1331 02/22/24 1932 02/23/24 0405  BP: 131/75 132/73 123/85 (!) 147/78  Pulse: 87 88 87 90  Resp: 17 19 16 16   Temp: 98 F (36.7 C) 97.6 F (36.4 C) 98.7 F (37.1 C) 99.1 F (37.3 C)  TempSrc:  Oral Oral Oral  SpO2: 96% 97% 98% 97%  Weight:      Height:        General: Pt is alert, awake, not  in acute distress Cardiovascular: RRR, S1/S2 +, no rubs, no gallops Respiratory: CTA bilaterally, no wheezing, no rhonchi Abdominal: Soft, NT, ND, bowel sounds + Extremities: Right lower extremity wound VAC clean dry intact    The results of significant diagnostics from this hospitalization (including imaging, microbiology, ancillary and laboratory) are listed below for reference.     Microbiology: Recent Results (from the past 240 hours)  Culture, blood (Routine X 2) w Reflex to ID Panel     Status: None   Collection Time: 02/14/24  1:00 AM   Specimen: BLOOD LEFT ARM  Result Value Ref Range Status   Specimen Description   Final    BLOOD LEFT ARM Performed at [facility], [address], [place], [place] [address]    Special Requests   Final    BOTTLES DRAWN AEROBIC AND ANAEROBIC Blood Culture adequate volume Performed at [facility], [address]  [place], [place] [address]    Culture   Final    NO GROWTH 5 DAYS Performed at [facility], [address] [place], [place] [address]    Report Status 02/19/2024 FINAL  Final  Culture, blood (Routine X 2) w Reflex to ID Panel     Status: None   Collection Time: 02/14/24  1:15 AM   Specimen: BLOOD RIGHT ARM  Result Value Ref Range Status   Specimen Description   Final    BLOOD RIGHT ARM Performed at [facility], [address] [place], [place] [address]    Special Requests   Final    BOTTLES DRAWN AEROBIC AND ANAEROBIC Blood Culture adequate volume Performed at [facility], [address], [place], [place] [address]    Culture   Final    NO GROWTH 5 DAYS Performed at [facility], [address], [place], [place] [address]    Report Status 02/19/2024 FINAL  Final  Aerobic/Anaerobic Culture w Gram Stain (surgical/deep wound)     Status: None   Collection Time: 02/16/24  1:29 PM   Specimen: Abscess  Result Value Ref Range  Status   Specimen Description   Final    ABSCESS Performed at [facility], [address], [place], [place] [address]    Special Requests   Final    NONE Performed at [facility], [address], [place], [place] [address]    Gram Stain   Final    ABUNDANT WBC PRESENT, PREDOMINANTLY PMN MODERATE GRAM POSITIVE COCCI    Culture   Final    ABUNDANT STAPHYLOCOCCUS AUREUS NO ANAEROBES ISOLATED Performed at [facility], [address], [place], [place] [address]    Report Status 02/21/2024 FINAL  Final   Organism ID, Bacteria STAPHYLOCOCCUS AUREUS  Final      Susceptibility   Staphylococcus aureus - MIC*    CIPROFLOXACIN <=0.5 SENSITIVE Sensitive     ERYTHROMYCIN >=8 RESISTANT Resistant     GENTAMICIN <=0.5 SENSITIVE Sensitive     OXACILLIN 0.5 SENSITIVE Sensitive     TETRACYCLINE <=1 SENSITIVE Sensitive     VANCOMYCIN 1 SENSITIVE Sensitive     TRIMETH/SULFA <=10 SENSITIVE Sensitive     CLINDAMYCIN RESISTANT Resistant     RIFAMPIN <=0.5 SENSITIVE Sensitive     Inducible Clindamycin POSITIVE Resistant     LINEZOLID 2 SENSITIVE Sensitive     * ABUNDANT STAPHYLOCOCCUS AUREUS  Surgical pcr screen     Status: None   Collection Time: 02/19/24  6:36 PM   Specimen: Nasal Mucosa; Nasal Swab  Result Value Ref Range Status   MRSA, PCR NEGATIVE NEGATIVE Final   Staphylococcus aureus NEGATIVE NEGATIVE Final    Comment: (NOTE) The Xpert SA Assay (FDA approved for NASAL specimens in patients 50 years of age and older), is one component of a comprehensive surveillance program. It is not intended to diagnose infection nor to guide or monitor treatment. Performed at [facility], [address], [place], [place] [address]   Aerobic/Anaerobic Culture w Gram Stain (surgical/deep wound)     Status: None (Preliminary result)   Collection Time: 02/20/24  9:43 AM   Specimen: Soft Tissue, Other  Result Value Ref Range Status   Specimen  Description TISSUE  Final   Special Requests SOURCE A  Final   Gram Stain   Final    FEW WBC PRESENT, PREDOMINANTLY PMN NO ORGANISMS SEEN Performed at [facility], [address] [place], [place] [address]    Culture   Final    FEW STAPHYLOCOCCUS AUREUS NO ANAEROBES ISOLATED; CULTURE IN PROGRESS FOR 5 DAYS    Report Status PENDING  Incomplete   Organism ID, Bacteria STAPHYLOCOCCUS AUREUS  Final      Susceptibility   Staphylococcus aureus - MIC*    CIPROFLOXACIN <=0.5 SENSITIVE Sensitive     ERYTHROMYCIN >=8 RESISTANT Resistant     GENTAMICIN <=0.5 SENSITIVE Sensitive     OXACILLIN 0.5 SENSITIVE Sensitive     TETRACYCLINE <=1 SENSITIVE Sensitive     VANCOMYCIN 1 SENSITIVE Sensitive     TRIMETH/SULFA <=10 SENSITIVE Sensitive     CLINDAMYCIN RESISTANT Resistant     RIFAMPIN <=0.5 SENSITIVE Sensitive     Inducible Clindamycin POSITIVE Resistant     LINEZOLID 2 SENSITIVE Sensitive     * FEW STAPHYLOCOCCUS AUREUS     Labs: BNP (last 3 results) No results for input(s): "BNP" in the last 8760 hours. Basic Metabolic Panel: Recent Labs  Lab 02/16/24 0737 02/17/24 0900 02/18/24 0747 02/19/24 0616 02/20/24 0545 02/21/24 0624 02/23/24 0519  NA 135   < > 135 136 134* 134* 133*  K 3.5   < > 3.8 3.9 3.8 4.2 4.0  CL 101   < > 101 101 102 100 98  CO2 22   < > 24 25 25 25 24   GLUCOSE 189*   < > 255* 207* 252* 268* 320*  BUN 11   < > 15 14 14 14 13   CREATININE 0.87   < > 0.88 0.85 0.72 0.99 0.83  CALCIUM 8.6*   < > 8.8* 8.9 8.7* 8.6* 8.4*  MG 2.0  --   --   --   --   --   --    < > = values in this interval not displayed.   Liver Function Tests: No results for input(s): "AST", "ALT", "ALKPHOS", "BILITOT", "PROT", "ALBUMIN" in the last 168 hours. No results for input(s): "LIPASE", "AMYLASE" in the last 168 hours. No results for input(s): "AMMONIA" in the last 168 hours. CBC: Recent Labs  Lab 02/18/24 0747 02/19/24 0616 02/20/24 0545 02/21/24 0624 02/23/24 0519  WBC  10.1 9.4 8.1 11.4* 10.7*  NEUTROABS  --   --  5.5  --   --   HGB 12.3* 11.7* 11.9* 11.6* 10.1*  HCT 36.4* 34.4* 35.3* 34.7* 31.1*  MCV 80.4 80.0 80.8 82.0 82.5  PLT 392 399 400 403* 370   CBG: Recent Labs  Lab 02/22/24 1231 02/22/24 1333 02/22/24 1704 02/22/24 2151 02/23/24 0613  GLUCAP 151* 179* 236* 298* 308*   Urinalysis    Component Value Date/Time   COLORURINE COLORLESS (A) 02/14/2024 0418   APPEARANCEUR CLEAR 02/14/2024 0418   LABSPEC 1.007 02/14/2024 0418   PHURINE 5.5 02/14/2024 0418   GLUCOSEU >1,000 (A) 02/14/2024 0418   HGBUR TRACE (A) 02/14/2024 0418   BILIRUBINUR NEGATIVE 02/14/2024 0418   KETONESUR NEGATIVE 02/14/2024 0418   PROTEINUR TRACE (A) 02/14/2024 0418   UROBILINOGEN 0.2 05/12/2010 1618   NITRITE NEGATIVE 02/14/2024 0418   LEUKOCYTESUR NEGATIVE 02/14/2024 0418   Sepsis Labs Recent Labs  Lab 02/19/24 0616 02/20/24 0545 02/21/24 0624 02/23/24 0519  WBC 9.4 8.1 11.4* 10.7*   Microbiology Recent Results (from the past 240 hours)  Culture, blood (Routine X 2) w Reflex to ID Panel     Status: None   Collection Time: 02/14/24  1:00 AM   Specimen: BLOOD LEFT  ARM  Result Value Ref Range Status   Specimen Description   Final    BLOOD LEFT ARM Performed at [facility], [address] [place], [place] [address]    Special Requests   Final    BOTTLES DRAWN AEROBIC AND ANAEROBIC Blood Culture adequate volume Performed at [facility], [address], [place], [place] [address]    Culture   Final    NO GROWTH 5 DAYS Performed at [facility], [address], [place], [place] [address]    Report Status 02/19/2024 FINAL  Final  Culture, blood (Routine X 2) w Reflex to ID Panel     Status: None   Collection Time: 02/14/24  1:15 AM   Specimen: BLOOD RIGHT ARM  Result Value Ref Range Status   Specimen Description   Final    BLOOD RIGHT ARM Performed at [facility], [address] [place], [place] [address]    Special Requests   Final    BOTTLES DRAWN AEROBIC AND ANAEROBIC Blood Culture adequate volume Performed at [facility], [address], [place], [place] [address]    Culture   Final    NO GROWTH 5 DAYS Performed at [facility], [address] [place], [place] [address]    Report Status 02/19/2024 FINAL  Final  Aerobic/Anaerobic Culture w Gram Stain (surgical/deep wound)     Status: None   Collection Time: 02/16/24  1:29 PM   Specimen: Abscess  Result Value Ref Range Status   Specimen Description   Final    ABSCESS Performed at [facility], [address], [place], [place] [address]    Special Requests   Final    NONE Performed at [name], [address], [place], [place] [address]    Gram Stain   Final    ABUNDANT WBC PRESENT, PREDOMINANTLY PMN MODERATE GRAM POSITIVE COCCI    Culture   Final    ABUNDANT STAPHYLOCOCCUS AUREUS NO ANAEROBES ISOLATED Performed at [facility], [address] [place], [place] [address]    Report Status 02/21/2024 FINAL  Final   Organism ID, Bacteria STAPHYLOCOCCUS AUREUS  Final      Susceptibility   Staphylococcus aureus - MIC*    CIPROFLOXACIN <=0.5 SENSITIVE Sensitive     ERYTHROMYCIN >=8 RESISTANT Resistant     GENTAMICIN <=0.5 SENSITIVE Sensitive     OXACILLIN 0.5 SENSITIVE Sensitive     TETRACYCLINE <=1 SENSITIVE Sensitive     VANCOMYCIN 1 SENSITIVE Sensitive     TRIMETH/SULFA <=10 SENSITIVE Sensitive     CLINDAMYCIN RESISTANT Resistant     RIFAMPIN <=0.5 SENSITIVE Sensitive     Inducible Clindamycin POSITIVE Resistant     LINEZOLID 2 SENSITIVE Sensitive     * ABUNDANT STAPHYLOCOCCUS AUREUS  Surgical pcr screen     Status: None   Collection Time: 02/19/24  6:36 PM   Specimen: Nasal Mucosa; Nasal Swab  Result Value Ref Range Status   MRSA, PCR NEGATIVE NEGATIVE Final   Staphylococcus aureus NEGATIVE NEGATIVE Final     Comment: (NOTE) The Xpert SA Assay (FDA approved for NASAL specimens in patients 39 years of age and older), is one component of a comprehensive surveillance program. It is not intended to diagnose infection nor to guide or monitor treatment. Performed at [facility], [address], [place], [place] [address]   Aerobic/Anaerobic Culture w Gram Stain (surgical/deep wound)  Status: None (Preliminary result)   Collection Time: 02/20/24  9:43 AM   Specimen: Soft Tissue, Other  Result Value Ref Range Status   Specimen Description TISSUE  Final   Special Requests SOURCE A  Final   Gram Stain   Final    FEW WBC PRESENT, PREDOMINANTLY PMN NO ORGANISMS SEEN Performed at [facility], [address] [place], [place] [address]    Culture   Final    FEW STAPHYLOCOCCUS AUREUS NO ANAEROBES ISOLATED; CULTURE IN PROGRESS FOR 5 DAYS    Report Status PENDING  Incomplete   Organism ID, Bacteria STAPHYLOCOCCUS AUREUS  Final      Susceptibility   Staphylococcus aureus - MIC*    CIPROFLOXACIN <=0.5 SENSITIVE Sensitive     ERYTHROMYCIN >=8 RESISTANT Resistant     GENTAMICIN <=0.5 SENSITIVE Sensitive     OXACILLIN 0.5 SENSITIVE Sensitive     TETRACYCLINE <=1 SENSITIVE Sensitive     VANCOMYCIN 1 SENSITIVE Sensitive     TRIMETH/SULFA <=10 SENSITIVE Sensitive     CLINDAMYCIN RESISTANT Resistant     RIFAMPIN <=0.5 SENSITIVE Sensitive     Inducible Clindamycin POSITIVE Resistant     LINEZOLID 2 SENSITIVE Sensitive     * FEW STAPHYLOCOCCUS AUREUS     Time coordinating discharge: Over 30 minutes  SIGNED:   Azucena Fallen, DO [facility] 02/23/2024, 7:35 AM Pager   If 7PM-7AM, please contact night-coverage [email or web address]

## 2024-02-23 NOTE — Progress Notes (Signed)
 Physical Therapy Treatment Patient Details Name: Ian Hughes MRN: [id number] DOB: June 26, 1981 Today's Date: 02/23/2024   History of Present Illness Patient is a 43 y/o male admitted 02/13/24 with R LE wound after scraping his leg on a pallet at work.  Noted to have edema, swelling and drainage with abscess.   Underwent IR US guided drainage on 3/29, I&D on 02/20/24, and repeat I&D 4/4.  PMH positive for cataract surgery, DM, hyperlipidemia.    PT Comments  Pt demo good mobility progress. Stair training complete. Mod I bed mobility, supervision transfers, supervision amb 200' with RW, and CGA ascend/descend 5 steps with 1 rail. Plan is for d/c home today. No follow up PT services indicated. Pt will need RW for home.     If plan is discharge home, recommend the following: A little help with walking and/or transfers;A little help with bathing/dressing/bathroom;Assistance with cooking/housework;Assist for transportation;Help with stairs or ramp for entrance   Can travel by private vehicle        Equipment Recommendations  Rolling walker (2 wheels)    Recommendations for Other Services       Precautions / Restrictions Precautions Precautions: Other (comment);Fall Recall of Precautions/Restrictions: Intact Precaution/Restrictions Comments: L LE wound vac     Mobility  Bed Mobility Overal bed mobility: Modified Independent             General bed mobility comments: increased time    Transfers Overall transfer level: Needs assistance Equipment used: Rolling walker (2 wheels) Transfers: Sit to/from Stand Sit to Stand: Supervision           General transfer comment: increased time    Ambulation/Gait Ambulation/Gait assistance: Supervision Gait Distance (Feet): 200 Feet Assistive device: Rolling walker (2 wheels) Gait Pattern/deviations: Step-to pattern, Decreased step length - right, Antalgic Gait velocity: decreased Gait velocity interpretation: <1.31 ft/sec,  indicative of household ambulator   General Gait Details: steady gait with RW   Stairs Stairs: Yes Stairs assistance: Contact guard assist Stair Management: One rail Right, One rail Left, Step to pattern, Sideways Number of Stairs: 5 General stair comments: 1st trial with 1 rail on R, 2nd trial with 1 rail on L   Wheelchair Mobility     Tilt Bed    Modified Rankin (Stroke Patients Only)       Balance Overall balance assessment: Needs assistance Sitting-balance support: No upper extremity supported, Feet supported Sitting balance-Leahy Scale: Good     Standing balance support: Reliant on assistive device for balance, Bilateral upper extremity supported, During functional activity Standing balance-Leahy Scale: Poor                              Communication Communication Communication: No apparent difficulties  Cognition Arousal: Alert Behavior During Therapy: WFL for tasks assessed/performed   PT - Cognitive impairments: No apparent impairments                         Following commands: Intact      Cueing    Exercises      General Comments        Pertinent Vitals/Pain Pain Assessment Pain Assessment: Faces Faces Pain Scale: Hurts little more Pain Location: L LE Pain Descriptors / Indicators: Discomfort, Grimacing, Guarding Pain Intervention(s): Monitored during session    Home Living  Prior Function            PT Goals (current goals can now be found in the care plan section) Acute Rehab PT Goals Patient Stated Goal: home Progress towards PT goals: Progressing toward goals    Frequency    Min 3X/week      PT Plan      Co-evaluation              AM-PAC PT "6 Clicks" Mobility   Outcome Measure  Help needed turning from your back to your side while in a flat bed without using bedrails?: None Help needed moving from lying on your back to sitting on the side of a flat bed  without using bedrails?: None Help needed moving to and from a bed to a chair (including a wheelchair)?: A Little Help needed standing up from a chair using your arms (e.g., wheelchair or bedside chair)?: A Little Help needed to walk in hospital room?: A Little Help needed climbing 3-5 steps with a railing? : A Little 6 Click Score: 20    End of Session Equipment Utilized During Treatment: Gait belt Activity Tolerance: Patient tolerated treatment well Patient left: in bed;with call bell/phone within reach;with family/visitor present Nurse Communication: Mobility status PT Visit Diagnosis: Difficulty in walking, not elsewhere classified (R26.2);Pain Pain - Right/Left: Left Pain - part of body: Leg     Time: 1610-9604 PT Time Calculation (min) (ACUTE ONLY): 30 min  Charges:    $Gait Training: 23-37 mins PT General Charges $$ ACUTE PT VISIT: 1 Visit                     Ferd Glassing., PT  Office # [phone]    Ilda Foil 02/23/2024, 9:36 AM

## 2024-02-23 NOTE — TOC Transition Note (Signed)
 Transition of Care [facility]) - Discharge Note   Patient Details  Name: Ian Hughes MRN: [id number] Date of Birth: Jun 20, 1981  Transition of Care [facility]) CM/SW Contact:  Ronny Bacon, RN Phone Number: 02/23/2024, 9:54 AM   Clinical Narrative:   Patient is being discharged today. RW ordered from [place] with [employer] to be delivered to bedside.     Final next level of care: Home/Self Care Barriers to Discharge: No Barriers Identified   Patient Goals and CMS Choice            Discharge Placement                       Discharge Plan and Services Additional resources added to the After Visit Summary for                  DME Arranged: Walker rolling DME Agency: [employer] Date DME Agency Contacted: 02/23/24 Time DME Agency Contacted: [phone] Representative spoke with at DME Agency: Vaughan Basta            Social Drivers of Health (SDOH) Interventions SDOH Screenings   Food Insecurity: No Food Insecurity (02/17/2024)  Housing: Unknown (02/17/2024)  Transportation Needs: No Transportation Needs (02/17/2024)  Utilities: Not At Risk (02/17/2024)  Alcohol Screen: Low Risk  (03/12/2023)  Depression (PHQ2-9): Low Risk  (01/08/2024)  Financial Resource Strain: Low Risk  (03/12/2023)  Physical Activity: Sufficiently Active (03/12/2023)  Social Connections: Patient Declined (02/17/2024)  Stress: No Stress Concern Present (03/12/2023)  Tobacco Use: Low Risk  (02/22/2024)     Readmission Risk Interventions    02/15/2024   10:55 AM  Readmission Risk Prevention Plan  Post Dischage Appt Complete  Medication Screening Complete  Transportation Screening Complete

## 2024-02-23 NOTE — Progress Notes (Addendum)
 Patient ID: Ian Hughes, male   DOB: 01-08-1981, 43 y.o.   MRN: [id number] Patient is status post repeat debridement right calf and application of Kerecis micro graft and Kerecis sheet.  There is 50 cc in the wound VAC canister.  Will plan for discharge to home today with the Prevena plus portable wound VAC pump.  Patient will follow-up in the office next week.  I placed the discharge orders for oxycodone for pain, doxycycline for antibiotic and orders for the Prevena plus portable wound VAC pump.  Did not complete the discharge summary in case there are any changes to his current medication.

## 2024-02-23 NOTE — Care Management (Cosign Needed)
    Durable Medical Equipment  (From admission, onward)           Start     Ordered   02/23/24 0951  For home use only DME Walker rolling  Once       Question Answer Comment  Walker: With 5 Inch Wheels   Patient needs a walker to treat with the following condition Weakness      02/23/24 0951   02/23/24 0000  For home use only DME Crutches        02/23/24 0718

## 2024-02-23 NOTE — Plan of Care (Signed)
 Patient ID: THEON SOBOTKA, male   DOB: [birth year], 43 y.o.   MRN: [id number]  Problem: Education: Goal: Ability to describe self-care measures that may prevent or decrease complications (Diabetes Survival Skills Education) will improve Outcome: Adequate for Discharge Goal: Individualized Educational Video(s) Outcome: Adequate for Discharge   Problem: Coping: Goal: Ability to adjust to condition or change in health will improve Outcome: Adequate for Discharge   Problem: Fluid Volume: Goal: Ability to maintain a balanced intake and output will improve Outcome: Adequate for Discharge   Problem: Health Behavior/Discharge Planning: Goal: Ability to identify and utilize available resources and services will improve Outcome: Adequate for Discharge Goal: Ability to manage health-related needs will improve Outcome: Adequate for Discharge   Problem: Metabolic: Goal: Ability to maintain appropriate glucose levels will improve Outcome: Adequate for Discharge   Problem: Nutritional: Goal: Maintenance of adequate nutrition will improve Outcome: Adequate for Discharge Goal: Progress toward achieving an optimal weight will improve Outcome: Adequate for Discharge   Problem: Skin Integrity: Goal: Risk for impaired skin integrity will decrease Outcome: Adequate for Discharge   Problem: Tissue Perfusion: Goal: Adequacy of tissue perfusion will improve Outcome: Adequate for Discharge   Problem: Education: Goal: Knowledge of General Education information will improve Description: Including pain rating scale, medication(s)/side effects and non-pharmacologic comfort measures Outcome: Adequate for Discharge   Problem: Health Behavior/Discharge Planning: Goal: Ability to manage health-related needs will improve Outcome: Adequate for Discharge   Problem: Clinical Measurements: Goal: Ability to maintain clinical measurements within normal limits will improve Outcome: Adequate for  Discharge Goal: Will remain free from infection Outcome: Adequate for Discharge Goal: Diagnostic test results will improve Outcome: Adequate for Discharge Goal: Respiratory complications will improve Outcome: Adequate for Discharge Goal: Cardiovascular complication will be avoided Outcome: Adequate for Discharge   Problem: Activity: Goal: Risk for activity intolerance will decrease Outcome: Adequate for Discharge   Problem: Nutrition: Goal: Adequate nutrition will be maintained Outcome: Adequate for Discharge   Problem: Coping: Goal: Level of anxiety will decrease Outcome: Adequate for Discharge   Problem: Elimination: Goal: Will not experience complications related to bowel motility Outcome: Adequate for Discharge Goal: Will not experience complications related to urinary retention Outcome: Adequate for Discharge   Problem: Pain Managment: Goal: General experience of comfort will improve and/or be controlled Outcome: Adequate for Discharge   Problem: Safety: Goal: Ability to remain free from injury will improve Outcome: Adequate for Discharge   Problem: Skin Integrity: Goal: Risk for impaired skin integrity will decrease Outcome: Adequate for Discharge   Problem: Increased Nutrient Needs (NI-5.1) Goal: Food and/or nutrient delivery Description: Individualized approach for food/nutrient provision. Outcome: Adequate for Discharge   Problem: Acute Rehab PT Goals(only PT should resolve) Goal: Pt Will Go Supine/Side To Sit Outcome: Adequate for Discharge Goal: Patient Will Transfer Sit To/From Stand Outcome: Adequate for Discharge Goal: Pt Will Ambulate Outcome: Adequate for Discharge Goal: Pt Will Go Up/Down Stairs Outcome: Adequate for Discharge    Lidia Collum, RN

## 2024-02-23 NOTE — Progress Notes (Signed)
 Patient ID: Ian Hughes, male   DOB: 01/16/1981, 43 y.o.   MRN: [id number]   02/23/24 1119  AVS Discharge Documentation  AVS Discharge Instructions Including Medications Provided to patient/caregiver  Name of Person Receiving AVS Discharge Instructions Including Medications Ian Hughes  Name of Clinician That Reviewed AVS Discharge Instructions Including Medications Jowan Skillin RN    Patient prevena properly charged. Removed our wound vac, placed on Prevena. Instructions for Prevena given, booklet with 1 800 number given. No further questions at this time.  Lidia Collum, RN

## 2024-02-25 ENCOUNTER — Telehealth: Payer: Self-pay

## 2024-02-25 ENCOUNTER — Other Ambulatory Visit (HOSPITAL_BASED_OUTPATIENT_CLINIC_OR_DEPARTMENT_OTHER): Admitting: Pharmacist

## 2024-02-25 ENCOUNTER — Encounter (HOSPITAL_COMMUNITY): Payer: Self-pay | Admitting: Orthopedic Surgery

## 2024-02-25 DIAGNOSIS — Z91199 Patient's noncompliance with other medical treatment and regimen due to unspecified reason: Secondary | ICD-10-CM

## 2024-02-25 LAB — AEROBIC/ANAEROBIC CULTURE W GRAM STAIN (SURGICAL/DEEP WOUND)

## 2024-02-25 NOTE — Progress Notes (Signed)
 Pharmacy Diabetes Measure Review  Patient was identified and referred by our [facility] team as needing improved DM control.  Call placed to patient to discuss diabetes control and medication management. Patient was not available. I left a HIPAA-compliant VM with instructions to return my call and schedule an appointment.   Butch Penny, PharmD, Patsy Baltimore, CPP Clinical Pharmacist [facility] [phone]

## 2024-02-25 NOTE — Transitions of Care (Post Inpatient/ED Visit) (Signed)
   02/25/2024  Name: Ian Hughes MRN: [id number] DOB: 19-Sep-1981  Today's TOC FU Call Status: Today's TOC FU Call Status:: Unsuccessful Call (1st Attempt) Unsuccessful Call (1st Attempt) Date: 02/25/24  Attempted to reach the patient regarding the most recent Inpatient/ED visit. Patient was called in an Outreach attempt to offer [facility]BCI  30-day TOC program. Pt is eligible for program due to potential risk for readmission and/or high utilization. Unfortunately, I was not able to speak with the patient in regards to recent hospital discharge   Left a HIPAA compliant phone message for patient including VBCI CM contact information with request for a call back in regard to recent hospital discharge  Follow Up Plan: Additional outreach attempts will be made to reach the patient to complete the Transitions of Care (Post Inpatient/ED visit) call.   Susa Loffler , BSN, RN [facility]   VBCI-Population Health RN Care Manager Direct Dial [phone]  Fax: [phone] Website: [email or web address]

## 2024-02-26 ENCOUNTER — Telehealth: Payer: Self-pay

## 2024-02-26 NOTE — Patient Instructions (Signed)
 Visit Information  Thank you for taking time to visit with me today. Please don't hesitate to contact me if I can be of assistance to you before our next scheduled telephone appointment.  Our next appointment is by telephone on 4/15 at 10:00am  Following is a copy of your care plan:   Goals Addressed             This Visit's Progress    [facility]I Transitions of Care (TOC) Care Plan       Problems:  Recent Hospitalization for treatment of DMII and LLE Cellulitis with Wound Vac  Diet/Nutrition/Food Resources A1C 14 % and Limited social support:    Goal:  Over the next 30 days, the patient will not experience hospital readmission  Interventions:  Transitions of Care:  New goal. Sick Day Rules Reviewed Doctor Visits  - discussed the importance of doctor visits Troubleshoot use of DME in the home  Post discharge activity limitations prescribed by provider reviewed Post-op wound/incision care reviewed with patient/caregiver Reviewed Signs and symptoms of infection  Diabetes Interventions:  (Status:  New goal.) Short Term Goal Assessed patient's understanding of A1c goal: <8% Provided education to patient about basic DM disease process Reviewed medications with patient and discussed importance of medication adherence Discussed plans with patient for ongoing care management follow up and provided patient with direct contact information for care management team Reviewed scheduled/upcoming provider appointments including: PCP scheduled visit with Care Guide assistance 02/27/24 @  Referral made to pharmacy team for assistance with prior to leaving hospital  Screening for signs and symptoms of depression related to chronic disease state  Assessed social determinant of health barriers Lab Results  Component Value Date   HGBA1C 14.1 (A) 01/22/2024    Wound Care (Status: New goal.) Short Term Goal Evaluation of current treatment plan related to LLE cellulitis and wound vac placement    addressed questions about wound  care  reviewed medications with patient and addressed questions reviewed scheduled provider appointments with patient confirmed availability of transportation to all appointments   Patient Self Care Activities:  Attend all scheduled provider appointments Call pharmacy for medication refills 3-7 days in advance of running out of medications Call provider office for new concerns or questions  Participate in Transition of Care Program/Attend TOC scheduled calls Perform all self care activities independently  Perform IADL's (shopping, preparing meals, housekeeping, managing finances) independently Take medications as prescribed   Work with the pharmacist to address medication management needs and will continue to work with the clinical team to address health care and disease management related needs  Plan:  Telephone follow up appointment with care management team member scheduled for:  03/04/24 10:00am  The patient has been provided with contact information for the care management team and has been advised to call with any health related questions or concerns.         Medication review  Reviewed current home medications -- provided education as needed. Patient is aware of potential side effects and was encouraged to notify PCP for any adverse side effects or unwanted symptoms not relieved with interventions Medication reconciliation / review completed based on most recent discharge summary and EHR medication list. Confirmed patient is taking all newly prescribed medications as instructed (any discrepancies are noted in review section)   Patient / Caregiver is aware of any changes to and / or  any dosage adjustments to medication regimen. Patient/ Caregiver denies questions at this time and reports no barriers to medication adherence.  Reviewed the following changes with the patient  START taking: isosorbide mononitrate (IMDUR) Start taking on: February 23, 2024 CHANGE how you take: torsemide [facility]) STOP taking: aspirin EC 81 MG tablet carvedilol 12.5 MG tablet (COREG) isosorbide dinitrate 30 MG tablet (ISORDIL   Reviewed goals for care Patient/ Caregiver verbalizes understanding of instructions with the plan of care . The  Patient / Caregiver was encouraged to make informed decisions about care, actively participate in managing health conditions, and implement lifestyle changes as needed to promote independence and self-management of healthcare. SDOH screenings have been completed and addressed if indicted.  There are no reported barriers to care.    Follow-up Plan 1.Scheduled a post hospital Follow up appointment  with PCP completed with Care Guide assistance Scheduled for 02/27/24 @ 1:00pm   Take all your medications and your discharge paperwork with you for your next visit with your Primary MD-   Be sure to request your Primary MD to go over all hospital tests and procedure/radiological results at the follow up appointment , Consider a referral to a Nutritionist for DM management , take weight  and BG  log   2. Surgical Follow-up 02/28/24- Eval wound vac and ongoing wound care   3. Discuss Endocrinologist referral for DM management ( Uses Libre device  Last A1C 14 % ) Has Parameters 80/300 No SSI coverage last BG was 277   4. Schedule call with [facility] Pharmacy for disease management    [facility]  Please call the care guide team at [phone]  if you need to cancel or reschedule your appointment . For scheduled calls -Three attempts will be made to reach you -if the scheduled call is missed or  we are unable to reach the you after 3 attempts no additional outreach attempts will be made and the TOC follow-up will be closed .   If you need to speak to a Nurse you may  call me directly at the number below or if I am unavailable,and  your need is urgent  please call the main VBCI number at [phone] and ask to speak with  one of the [facility] ( Transition of Care )  Nurses  .  Patient was encouraged to Contact PCP with any changes in baseline or  medication regimen,  changes in health status  /  well-being, safety concerns, including falls any questions or concerns regarding ongoing medical care, any difficulty obtaining or picking up prescriptions, any changes or worsening in condition- including  symptoms not relieved  with interventions                                                                            Additionally, If you experience worsening of your symptoms, develop shortness of breath, If you are experiencing a medical emergency,  develop suicidal or homicidal thoughts you must seek medical attention immediately by calling 911 or report to your local emergency department or urgent care.   If you have a non-emergency medical problem during routine business hours, please contact your provider's office and ask to speak with a nurse.       Please take the time to read instructions/literature along with the possible adverse reactions/side  effects for all the Medicines that have been prescribed to you. Only take newly prescribed  Medications after you have completely understood and accept all the possible adverse reactions/side effects.   Do not take more than prescribed Medications for  Pain, Sleep and Anxiety. Do not drive when taking Pain medications or sleep aid/ insomnia  medications It is not advisable to combine anxiety, sleep and pain medications without talking with your primary care practitioner    If you are experiencing a Mental Health or Behavioral Health Crisis or need someone to talk to Please call the Suicide and Crisis Lifeline: 988 You may also call the [place] National Suicide Prevention Lifeline: 901-309-4603 or TTY: 680 353 8033 TTY (360)738-8755) to talk to a trained counselor.  You may call the Behavioral Health Crisis Line at 4785038841, at any time, 24 hours a day, 7 days a week-  however If you are in danger or need immediate medical attention, call 911.   If you would like help to quit smoking, call 1-800-QUIT-NOW ( 478 179 8917) OR Espaol: 1-855-Djelo-Ya (3-474-259-5638) o para ms informacin haga clic aqu or Text READY to 756-433 to register via text.   The patient has been provided with contact information for the care management team and has been advised to call with any health-related questions or concerns. Follow up call  with Care Team  as scheduled,or sooner should any new problems arise.  Susa Loffler , BSN, RN [place]   VBCI-Population Health RN Care Manager Direct Dial [phone]  Website: [email or web address]

## 2024-02-26 NOTE — Transitions of Care (Post Inpatient/ED Visit) (Signed)
 02/26/2024  Name: Ian Hughes MRN: [id number] DOB: [birth year]  Today's TOC FU Call Status: Today's TOC FU Call Status:: Successful TOC FU Call Completed Unsuccessful Call (1st Attempt) Date: 02/25/24 Unsuccessful Call (2nd Attempt) Date: 02/26/24 [facility] FU Call Complete Date: 02/26/24 Patient's Name and Date of Birth confirmed.  Transition Care Management Follow-up Telephone Call Date of Discharge: 02/23/24 Discharge Facility: [facility]) Type of Discharge: Inpatient Admission Primary Inpatient Discharge Diagnosis:: Cellulitis of right lower leg How have you been since you were released from the hospital?: Better Any questions or concerns?: No  Items Reviewed: Did you receive and understand the discharge instructions provided?: Yes (He stated he did not review but did have them covered before he left) Reviewed the following recommendations  Record daily weight on same scale at same time of day. Call your doctor: (Anytime you feel any of the following symptoms) 3-4 pound weight gain in 1-2 days or 2 pounds overnight Shortness of breath, with or without a dry hacking cough Swelling in the hands, feet or stomach If you have to sleep on extra pillows at night in order to breathe   Medications obtained,verified, and reconciled?: Yes (Medications Reviewed) Any new allergies since your discharge?: No Dietary orders reviewed?: Yes Type of Diet Ordered:: Reg Heart healthy Carb Modified Do you have support at home?: Yes People in Home [RPT]: spouse Name of Support/Comfort Primary Source: Mom Paulette  Medications Reviewed Today: Medications Reviewed Today     Reviewed by Johnnette Barrios, RN (Registered Nurse) on 02/26/24 at 1213  Med List Status: <None>   Medication Order Taking? Sig Documenting Provider Last Dose Status Informant  acetaminophen (TYLENOL) 500 MG tablet [id number] Yes Take 500 mg by mouth every 6 (six) hours as needed. [provider] Taking Active Self   amLODipine (NORVASC) 5 MG tablet [id number] Yes Take 5 mg by mouth daily. [provider] Taking Active Self  atorvastatin (LIPITOR) 10 MG tablet [id number] Yes Take 1 tablet (10 mg total) by mouth daily. Rema Fendt, NP Taking Active Self   Patient not taking:   Discontinued 10/06/19 1134 Continuous Glucose Receiver (FREESTYLE LIBRE 2 READER) DEVI [id number] Yes Use to check blood sugar continuously throughout the day. E11.42 Hoy Register, MD Taking Active Self  Continuous Glucose Sensor (FREESTYLE LIBRE 2 SENSOR) MISC [id number] Yes Use to check blood sugar continuously throughout the day. Change sensors once every 14 days. F64.33 Hoy Register, MD Taking Active Self           Med Note Merlene Morse, KIMBERLY L   Thu Feb 14, 2024 10:14 AM) Doesn't have on right now  doxycycline (VIBRA-TABS) 100 MG tablet [id number] Yes Take 1 tablet (100 mg total) by mouth 2 (two) times daily. Nadara Mustard, MD Taking Active   gabapentin (NEURONTIN) 300 MG capsule [id number] Yes Take 1 capsule (300 mg total) by mouth at bedtime. Rema Fendt, NP Taking Active Self   Patient not taking:   Discontinued 10/06/19 1134 hydrochlorothiazide (HYDRODIURIL) 12.5 MG tablet [id number] Yes Take 1 tablet (12.5 mg total) by mouth daily. Rema Fendt, NP Taking Active Self  Insulin Glargine [facility] KWIKPEN) 100 UNIT/ML [id number] Yes Inject 13 Units into the skin at bedtime. Rema Fendt, NP Taking Active Self  Insulin Pen Needle 32G X 4 MM MISC [id number] Yes 1 each by Other route daily. Use to inject insulin once daily. Rema Fendt, NP Taking Active Self  isosorbide mononitrate (IMDUR) 60 MG 24 hr tablet [id number] Yes  Take 60 mg by mouth daily. [provider] Taking Active   losartan (COZAAR) 50 MG tablet [id number] Yes Take 1 tablet (50 mg total) by mouth daily. Rema Fendt, NP Taking Active Self  metFORMIN (GLUCOPHAGE-XR) 500 MG 24 hr tablet [id number] Yes Take 2 tablets (1,000 mg total) by  mouth 2 (two) times daily with a meal. Rema Fendt, NP Taking Active Self  oxyCODONE-acetaminophen (PERCOCET/ROXICET) 5-325 MG tablet [id number] Yes Take 1 tablet by mouth every 4 (four) hours as needed. Nadara Mustard, MD Taking Active   TRUEplus Lancets 28G MISC [id number] Yes Use to check blood sugar 2 times a day Rema Fendt, NP Taking Active Self          Medication reconciliation / review completed based on most recent discharge summary and EHR medication list. Confirmed patient is taking all newly prescribed medications as instructed (any discrepancies are noted in review section)   Patient / Caregiver is aware of any changes to and / or  any dosage adjustments to medication regimen. Patient/ Caregiver denies questions at this time and reports no barriers to medication adherence.   Reviewed the following changes with the patient  START taking: isosorbide mononitrate (IMDUR) Start taking on: February 23, 2024 CHANGE how you take: torsemide [facility]) STOP taking: aspirin EC 81 MG tablet carvedilol 12.5 MG tablet (COREG) isosorbide dinitrate 30 MG tablet (ISORDIL  Home Care and Equipment/Supplies: Were Home Health Services Ordered?: No Any new equipment or medical supplies ordered?: Yes Name of Medical supply agency?: [facility] Were you able to get the equipment/medical supplies?: Yes Do you have any questions related to the use of the equipment/supplies?: No  Functional Questionnaire: Do you need assistance with bathing/showering or dressing?: No Do you need assistance with meal preparation?: No Do you need assistance with eating?: No Do you have difficulty maintaining continence: No Do you need assistance with getting out of bed/getting out of a chair/moving?: No Do you have difficulty managing or taking your medications?: No  Follow up appointments reviewed: Specialist Hospital Follow-up appointment confirmed?: Yes Date of Specialist follow-up appointment?:  02/28/24 Follow-Up Specialty Provider:: Surgical Follow-up Do you need transportation to your follow-up appointment?: No Do you understand care options if your condition(s) worsen?: Yes-patient verbalized understanding  SDOH Interventions Today    Flowsheet Row Most Recent Value  SDOH Interventions   Food Insecurity Interventions Intervention Not Indicated  Housing Interventions Intervention Not Indicated  Transportation Interventions Intervention Not Indicated, Patient Resources (Friends/Family), Payor Benefit  Utilities Interventions Intervention Not Indicated      Patient is at high risk for readmission and/or has history of  high utilization  Discussed VBCI  TOC program and weekly calls to patient to assess condition/status, medication management  and provide support/education as indicated . Patient/ Caregiver voiced understanding and is  agreeable to 30 day program    Consent to share information   The Patient  / Caregiver is aware and gave permission for VBCI CM  to share personal information  including TOC engagement notes with other service providers in connection with care, including accessing and sharing medical, and if applicable, mental health records. The Patient / Caregiver also agreed to a referral ( if indicated) being requested in order to support their identified needs.    Follow-up Plan 1.Scheduled a post hospital Follow up appointment  with PCP completed with Care Guide assistance Scheduled for 02/27/24 @ 1:00pm   Take all your medications and your discharge paperwork with you for  your next visit with your Primary MD-   Be sure to request your Primary MD to go over all hospital tests and procedure/radiological results at the follow up appointment , Consider a referral to a Nutritionist for DM management , take weight  and BG  log   2. Surgical Follow-up 02/28/24- Eval wound vac and ongoing wound care   3. Discuss Endocrinologist referral for DM management ( Uses Libre  device  Last A1C 14 % ) Has Parameters 80/300 No SSI coverage last BG was 277   4. Schedule call with VBCI Pharmacy for disease management   Routine follow-up and on-going assessment evaluation and education of disease processes, recommended interventions for both chronic and acute medical conditions , will occur during each weekly visit along with ongoing review of symptoms ,medication reviews and reconciliation. Any updates , inconsistencies, discrepancies or acute care concerns will be addressed and routed to the correct Practitioner if indicated   Based on current information and Insurance plan -Reviewed benefits available to patient, including details about eligibility options for care if any area of needs were identified.  Reviewed patients ability to access and / or navigating the benefits system..Amb Referral made if indicted , refer to orders section of note for details   Please refer to Care Plan for goals and interventions -Effectiveness of interventions, symptom management and outcomes will be evaluated  weekly during [facility] 30-day Program Outreach calls  . Any necessary  changes and updates to Care Plan will be completed episodically    Reviewed goals for care Patient verbalizes understanding of instructions and care plan provided. Patient was encouraged to make informed decisions about their care, actively participate in managing their health condition, and implement lifestyle changes as needed to promote independence and self-management of health care   Patient was encouraged to Contact PCP with any changes in baseline or  medication regimen,  changes in health status  /  well-being, safety concerns, including falls any questions or concerns regarding ongoing medical care, any difficulty obtaining or picking up prescriptions, any changes or worsening in condition- including  symptoms not relieved  with interventions    The patient has been provided with contact information for the care  management team and has been advised to call with any health-related questions or concerns. Follow up call  with Care Team  as scheduled,or sooner should any new problems arise.   Susa Loffler , BSN, RN [facility]   VBCI-Population Health RN Care Manager Direct Dial [phone]  Fax: [phone] Website: [email or web address]

## 2024-02-27 ENCOUNTER — Encounter: Payer: Self-pay | Admitting: Family

## 2024-02-27 ENCOUNTER — Ambulatory Visit (INDEPENDENT_AMBULATORY_CARE_PROVIDER_SITE_OTHER): Admitting: Family

## 2024-02-27 VITALS — BP 145/87 | HR 90 | Temp 98.3°F | Resp 16 | Ht 70.0 in | Wt 242.6 lb

## 2024-02-27 DIAGNOSIS — L02415 Cutaneous abscess of right lower limb: Secondary | ICD-10-CM

## 2024-02-27 DIAGNOSIS — I10 Essential (primary) hypertension: Secondary | ICD-10-CM

## 2024-02-27 DIAGNOSIS — Z09 Encounter for follow-up examination after completed treatment for conditions other than malignant neoplasm: Secondary | ICD-10-CM | POA: Diagnosis not present

## 2024-02-27 DIAGNOSIS — S80811D Abrasion, right lower leg, subsequent encounter: Secondary | ICD-10-CM

## 2024-02-27 DIAGNOSIS — L03115 Cellulitis of right lower limb: Secondary | ICD-10-CM | POA: Diagnosis not present

## 2024-02-27 DIAGNOSIS — N179 Acute kidney failure, unspecified: Secondary | ICD-10-CM

## 2024-02-27 DIAGNOSIS — L089 Local infection of the skin and subcutaneous tissue, unspecified: Secondary | ICD-10-CM

## 2024-02-27 DIAGNOSIS — Z794 Long term (current) use of insulin: Secondary | ICD-10-CM

## 2024-02-27 DIAGNOSIS — E119 Type 2 diabetes mellitus without complications: Secondary | ICD-10-CM

## 2024-02-27 NOTE — Progress Notes (Signed)
 Patient ID: Ian Hughes, male    DOB: Jun 05, 1981  MRN: [id number]  CC: Hospital Follow-Up  Subjective: Ian Hughes is a 43 y.o. male who presents for hospital follow-up. He is accompanied by his mother.  His concerns today include:  02/13/2024 - 02/23/2024 (10 days) [facility] MD note:  Recommendations for Outpatient Follow-up:  Follow up with PCP in 1-2 weeks Follow-up with orthopedics Ian Hughes as scheduled in the next 1 to 2 weeks, continue wound VAC as discussed:   Discharge Diagnoses:  Principal Problem:   Cellulitis of right lower leg Active Problems:   Insulin dependent type 2 diabetes mellitus (HCC)   AKI (acute kidney injury) (HCC)   Abscess of right lower leg   Infected abrasion of right leg   Right lower extremity wound/cellulitis with abscess/phlegmon, POA Sepsis secondary to lower extremity wound/cellulitis/abscess, POA Left lower extremity wound Status post surgical debridement 02/20/24 and 02/22/24 -transition to doxycycline   Type 2 diabetes mellitus, with hyperglycemia, uncontrolled Hemoglobin A1c 12.8 on 01/08/2024 Resume home regimen includes insulin glargine 13 units subcutaneously daily, metformin 1000 mg p.o. twice daily   Essential hypertension Amlodipine, Losartan and HCTZ    Hyperlipidemia Atorvastatin 10 mg p.o. daily   Obesity, class II Body mass index is 35.73 kg/m.   Today's office visit 02/27/2024: Reports feeling stable since hospital discharge. Denies red flag symptoms. Doing well on medication regimen, no issues/concerns. States since previous office visit he did not call Endocrinology referral. Mother states patient will call soon to schedule appointment. Appointment on tomorrow with Orthopedics. Upcoming appointment with Cardiology. No further issues/concerns for discussion today.  Patient Active Problem List   Diagnosis Date Noted   Infected abrasion of right leg 02/20/2024   Abscess of right lower leg 02/18/2024    Cellulitis of right lower leg 02/14/2024   AKI (acute kidney injury) (HCC) 02/14/2024   Type 2 diabetes mellitus with diabetic polyneuropathy, with long-term current use of insulin (HCC) 06/13/2022   Type 2 diabetes mellitus with hyperglycemia, with long-term current use of insulin (HCC) 06/13/2022   Insulin dependent type 2 diabetes mellitus (HCC) 02/11/2020   Glucosuria 02/11/2020   Non-restorative sleep 02/11/2020   Excessive daytime sleepiness 02/11/2020   Sleep related headaches 02/11/2020   Nocturia more than twice per night 02/11/2020   UARS (upper airway resistance syndrome) 04/07/2015   Severe obesity (BMI >= 40) (HCC) 04/07/2015   Primary snoring 04/07/2015   Snoring 12/21/2014   Drowsiness 12/21/2014   Witnessed apneic spells 12/21/2014   ED (erectile dysfunction) 09/24/2012   Type 2 diabetes mellitus with obesity (HCC) 07/04/2009    Class: Chronic   OBESITY, NOS 01/17/2007   BELLS PALSY 01/17/2007   HYPERTENSION, BENIGN SYSTEMIC 01/17/2007     Current Outpatient Medications on File Prior to Visit  Medication Sig Dispense Refill   acetaminophen (TYLENOL) 500 MG tablet Take 500 mg by mouth every 6 (six) hours as needed.     amLODipine (NORVASC) 5 MG tablet Take 5 mg by mouth daily.     atorvastatin (LIPITOR) 10 MG tablet Take 1 tablet (10 mg total) by mouth daily. 90 tablet 0   Continuous Glucose Receiver (FREESTYLE LIBRE 2 READER) DEVI Use to check blood sugar continuously throughout the day. E11.42 1 each 0   doxycycline (VIBRA-TABS) 100 MG tablet Take 1 tablet (100 mg total) by mouth 2 (two) times daily. 60 tablet 0   gabapentin (NEURONTIN) 300 MG capsule Take 1 capsule (300 mg total) by  mouth at bedtime. 90 capsule 0   hydrochlorothiazide (HYDRODIURIL) 12.5 MG tablet Take 1 tablet (12.5 mg total) by mouth daily. 90 tablet 0   Insulin Glargine (BASAGLAR KWIKPEN) 100 UNIT/ML Inject 13 Units into the skin at bedtime. 15 mL 0   Insulin Pen Needle 32G X 4 MM MISC 1 each by  Other route daily. Use to inject insulin once daily. 100 each 0   isosorbide mononitrate (IMDUR) 60 MG 24 hr tablet Take 60 mg by mouth daily.     losartan (COZAAR) 50 MG tablet Take 1 tablet (50 mg total) by mouth daily. 90 tablet 0   metFORMIN (GLUCOPHAGE-XR) 500 MG 24 hr tablet Take 2 tablets (1,000 mg total) by mouth 2 (two) times daily with a meal. 360 tablet 0   oxyCODONE-acetaminophen (PERCOCET/ROXICET) 5-325 MG tablet Take 1 tablet by mouth every 4 (four) hours as needed. 30 tablet 0   TRUEplus Lancets 28G MISC Use to check blood sugar 2 times a day 100 each 4   Continuous Glucose Sensor (FREESTYLE LIBRE 2 SENSOR) MISC Use to check blood sugar continuously throughout the day. Change sensors once every 14 days. E11.42 2 each 4   [DISCONTINUED] bromocriptine (PARLODEL) 2.5 MG tablet 1/4 tab daily (Patient not taking: Reported on 03/12/2019) 25 tablet 3   [DISCONTINUED] glimepiride (AMARYL) 1 MG tablet Take 0.5 tablets (0.5 mg total) by mouth daily with breakfast. (Patient not taking: Reported on 03/12/2019) 45 tablet 3   No current facility-administered medications on file prior to visit.    No Known Allergies  Social History   Socioeconomic History   Marital status: Divorced    Spouse name: Not on file   Number of children: 1   Years of education: 12+   Highest education level: 12th grade  Occupational History    Employer: [employer]  Tobacco Use   Smoking status: Never    Passive exposure: Never   Smokeless tobacco: Never  Vaping Use   Vaping status: Never Used  Substance and Sexual Activity   Alcohol use: Yes    Comment: 1 or 2 every 3 months    Drug use: No   Sexual activity: Not on file  Other Topics Concern   Not on file  Social History Narrative   Lives at home with wife and stepson.   Caffeine use: none   Has one child.   Social Drivers of Corporate investment banker Strain: Low Risk  (03/12/2023)   Overall Financial Resource Strain (CARDIA)    Difficulty of  Paying Living Expenses: Not very hard  Food Insecurity: No Food Insecurity (02/26/2024)   Hunger Vital Sign    Worried About Running Out of Food in the Last Year: Never true    Ran Out of Food in the Last Year: Never true  Transportation Needs: No Transportation Needs (02/26/2024)   PRAPARE - Administrator, Civil Service (Medical): No    Lack of Transportation (Non-Medical): No  Physical Activity: Sufficiently Active (03/12/2023)   Exercise Vital Sign    Days of Exercise per Week: 7 days    Minutes of Exercise per Session: 30 min  Stress: No Stress Concern Present (03/12/2023)   [employer] of Occupational Health - Occupational Stress Questionnaire    Feeling of Stress : Only a little  Social Connections: Patient Declined (02/17/2024)   Social Connection and Isolation Panel [NHANES]    Frequency of Communication with Friends and Family: Patient declined    Frequency of  Social Gatherings with Friends and Family: Patient declined    Attends Religious Services: Patient declined    Database administrator or Organizations: Patient declined    Attends Banker Meetings: Patient declined    Marital Status: Patient declined  Intimate Partner Violence: Not At Risk (02/26/2024)   Humiliation, Afraid, Rape, and Kick questionnaire    Fear of Current or Ex-Partner: No    Emotionally Abused: No    Physically Abused: No    Sexually Abused: No    Family History  Problem Relation Age of Onset   Diabetes Mother    Cancer Father        leukemia    Obesity Other     Past Surgical History:  Procedure Laterality Date   EYE SURGERY Left 2011   cataract   EYE SURGERY Right 2012   INCISION AND DRAINAGE OF DEEP ABSCESS, CALF Right 02/20/2024   Procedure: INCISION AND DRAINAGE OF DEEP ABSCESS, CALF;  Surgeon: Nadara Mustard, MD;  Location: [facility] OR;  Service: Orthopedics;  Laterality: Right;  RIGHT LEG DEBRIDEMENT   INCISION AND DRAINAGE OF WOUND Right 02/22/2024   Procedure:  IRRIGATION AND DEBRIDEMENT WOUND;  Surgeon: Nadara Mustard, MD;  Location: [facility] OR;  Service: Orthopedics;  Laterality: Right;  IRRIGATION AND DEBRIDEMENT RIGHT LEG    ROS: Review of Systems Negative except as stated above  PHYSICAL EXAM: BP (!) 145/87   Pulse 90   Temp 98.3 F (36.8 C) (Esophageal)   Resp 16   Ht 5\' 10"  (1.778 m)   Wt 242 lb 9.6 oz (110 kg)   SpO2 96%   BMI 34.81 kg/m   Physical Exam HENT:     Head: Normocephalic and atraumatic.     Nose: Nose normal.     Mouth/Throat:     Mouth: Mucous membranes are moist.     Pharynx: Oropharynx is clear.  Eyes:     Extraocular Movements: Extraocular movements intact.     Conjunctiva/sclera: Conjunctivae normal.     Pupils: Pupils are equal, round, and reactive to light.  Cardiovascular:     Rate and Rhythm: Normal rate and regular rhythm.     Pulses: Normal pulses.     Heart sounds: Normal heart sounds.  Pulmonary:     Effort: Pulmonary effort is normal.     Breath sounds: Normal breath sounds.  Musculoskeletal:        General: Normal range of motion.     Cervical back: Normal range of motion and neck supple.     Comments: Ace bandage and drainage device clean/dry/intact right lower extremity.  Neurological:     General: No focal deficit present.     Mental Status: He is alert and oriented to person, place, and time.  Psychiatric:        Mood and Affect: Mood normal.        Behavior: Behavior normal.     ASSESSMENT AND PLAN: 1. Hospital discharge follow-up (Primary) - Reviewed hospital course, current medications, ensured proper follow-up in place, and addressed concerns.   2. Cellulitis of right lower leg 3. Abscess of right lower leg 4. Infected abrasion of right lower extremity, subsequent encounter - Keep all scheduled appointments with Orthopedics.   5. Insulin dependent type 2 diabetes mellitus (HCC) - Continue present management.  - Discussed the importance of healthy eating habits,  low-carbohydrate diet, low-sugar diet, regular aerobic exercise (at least 150 minutes a week as tolerated) and medication compliance to achieve  or maintain control of diabetes. - Keep all scheduled appointments with Endocrinology.  6. AKI (acute kidney injury) (HCC) - GFR normal on 02/23/2024.  7. Primary hypertension - Blood pressure not at goal during today's visit. Patient asymptomatic without chest pressure, chest pain, palpitations, shortness of breath, worst headache of life, and any additional red flag symptoms. - Continue present management. - Counseled on blood pressure goal of less than 130/80, low-sodium, DASH diet, medication compliance, and 150 minutes of moderate intensity exercise per week as tolerated. Counseled on medication adherence and adverse effects. - Keep all scheduled appointments with Cardiology.    Patient was given the opportunity to ask questions.  Patient verbalized understanding of the plan and was able to repeat key elements of the plan. Patient was given clear instructions to go to Emergency Department or return to medical center if symptoms don't improve, worsen, or new problems develop.The patient verbalized understanding.  Follow-up with primary provider as scheduled.  Rema Fendt, NP

## 2024-02-27 NOTE — Progress Notes (Signed)
 Patient is wanting to possible change up his medication.

## 2024-02-28 ENCOUNTER — Other Ambulatory Visit: Payer: Self-pay

## 2024-02-28 ENCOUNTER — Encounter (HOSPITAL_BASED_OUTPATIENT_CLINIC_OR_DEPARTMENT_OTHER): Payer: Self-pay

## 2024-02-28 ENCOUNTER — Ambulatory Visit (INDEPENDENT_AMBULATORY_CARE_PROVIDER_SITE_OTHER): Admitting: Orthopedic Surgery

## 2024-02-28 ENCOUNTER — Emergency Department (HOSPITAL_BASED_OUTPATIENT_CLINIC_OR_DEPARTMENT_OTHER)
Admission: EM | Admit: 2024-02-28 | Discharge: 2024-02-28 | Disposition: A | Discharge status: Home / Self Care | Attending: Emergency Medicine | Admitting: Emergency Medicine

## 2024-02-28 DIAGNOSIS — Z794 Long term (current) use of insulin: Secondary | ICD-10-CM | POA: Diagnosis not present

## 2024-02-28 DIAGNOSIS — E119 Type 2 diabetes mellitus without complications: Secondary | ICD-10-CM | POA: Diagnosis not present

## 2024-02-28 DIAGNOSIS — Z7984 Long term (current) use of oral hypoglycemic drugs: Secondary | ICD-10-CM | POA: Diagnosis not present

## 2024-02-28 DIAGNOSIS — S81801A Unspecified open wound, right lower leg, initial encounter: Secondary | ICD-10-CM | POA: Insufficient documentation

## 2024-02-28 DIAGNOSIS — L97911 Non-pressure chronic ulcer of unspecified part of right lower leg limited to breakdown of skin: Secondary | ICD-10-CM

## 2024-02-28 DIAGNOSIS — X58XXXA Exposure to other specified factors, initial encounter: Secondary | ICD-10-CM | POA: Insufficient documentation

## 2024-02-28 NOTE — ED Provider Notes (Signed)
 [place] EMERGENCY DEPARTMENT AT [facility] Provider Note   CSN: [id number] Arrival date & time: 02/28/24  [id number]     History  Chief Complaint  Patient presents with   Wound Check    Ian Hughes is a 43 y.o. male.  Patient is a 43 year old male with past medical history of diabetes and a recent history of an abrasion on the right lower leg that ended up getting cellulitis and an abscess that was taken to the OR for washout with a wound VAC placed.  Presents to the emergency department today due to bleeding after the wound VAC was removed earlier this morning.  He states he has had difficulty controlling the bleeding with gauze at the house and Coban's.  The history is provided by the patient. No language interpreter was used.  Wound Check Pertinent negatives include no chest pain, no abdominal pain and no shortness of breath.       Home Medications Prior to Admission medications   Medication Sig Start Date End Date Taking? Authorizing Provider  acetaminophen (TYLENOL) 500 MG tablet Take 500 mg by mouth every 6 (six) hours as needed.    [provider]  amLODipine (NORVASC) 5 MG tablet Take 5 mg by mouth daily. 09/24/23   [provider]  atorvastatin (LIPITOR) 10 MG tablet Take 1 tablet (10 mg total) by mouth daily. 09/19/23   Rema Fendt, NP  Continuous Glucose Receiver (FREESTYLE LIBRE 2 READER) DEVI Use to check blood sugar continuously throughout the day. E11.42 03/16/23   Hoy Register, MD  Continuous Glucose Sensor (FREESTYLE LIBRE 2 SENSOR) MISC Use to check blood sugar continuously throughout the day. Change sensors once every 14 days. E11.42 03/16/23   Hoy Register, MD  doxycycline (VIBRA-TABS) 100 MG tablet Take 1 tablet (100 mg total) by mouth 2 (two) times daily. 02/23/24   Nadara Mustard, MD  gabapentin (NEURONTIN) 300 MG capsule Take 1 capsule (300 mg total) by mouth at bedtime. 01/08/24   Rema Fendt, NP  hydrochlorothiazide  (HYDRODIURIL) 12.5 MG tablet Take 1 tablet (12.5 mg total) by mouth daily. 01/22/24 04/21/24  Rema Fendt, NP  Insulin Glargine [facility] KWIKPEN) 100 UNIT/ML Inject 13 Units into the skin at bedtime. 01/22/24   Rema Fendt, NP  Insulin Pen Needle 32G X 4 MM MISC 1 each by Other route daily. Use to inject insulin once daily. 01/10/24   Rema Fendt, NP  isosorbide mononitrate (IMDUR) 60 MG 24 hr tablet Take 60 mg by mouth daily.    [provider]  losartan (COZAAR) 50 MG tablet Take 1 tablet (50 mg total) by mouth daily. 01/22/24 04/21/24  Rema Fendt, NP  metFORMIN (GLUCOPHAGE-XR) 500 MG 24 hr tablet Take 2 tablets (1,000 mg total) by mouth 2 (two) times daily with a meal. 01/22/24 04/21/24  Rema Fendt, NP  oxyCODONE-acetaminophen (PERCOCET/ROXICET) 5-325 MG tablet Take 1 tablet by mouth every 4 (four) hours as needed. 02/23/24   Nadara Mustard, MD  TRUEplus Lancets 28G MISC Use to check blood sugar 2 times a day 03/16/23   Rema Fendt, NP  bromocriptine (PARLODEL) 2.5 MG tablet 1/4 tab daily Patient not taking: Reported on 03/12/2019 01/11/18 10/06/19  Romero Belling, MD  glimepiride (AMARYL) 1 MG tablet Take 0.5 tablets (0.5 mg total) by mouth daily with breakfast. Patient not taking: Reported on 03/12/2019 01/11/18 10/06/19  Romero Belling, MD      Allergies    Patient has  no known allergies.    Review of Systems   Review of Systems  Constitutional:  Negative for chills and fever.  HENT:  Negative for ear pain and sore throat.   Eyes:  Negative for pain and visual disturbance.  Respiratory:  Negative for cough and shortness of breath.   Cardiovascular:  Negative for chest pain and palpitations.  Gastrointestinal:  Negative for abdominal pain and vomiting.  Genitourinary:  Negative for dysuria and hematuria.  Musculoskeletal:  Negative for arthralgias and back pain.  Skin:  Positive for wound. Negative for color change and rash.  Neurological:  Negative for seizures and  syncope.  All other systems reviewed and are negative.   Physical Exam Updated Vital Signs BP (!) 148/89   Pulse 87   Temp 97.7 F (36.5 C)   Resp 18   Ht 5\' 10"  (1.778 m)   Wt 109.8 kg   SpO2 100%   BMI 34.72 kg/m  Physical Exam Vitals and nursing note reviewed.  Constitutional:      General: He is not in acute distress.    Appearance: He is well-developed.  HENT:     Head: Normocephalic and atraumatic.  Eyes:     Conjunctiva/sclera: Conjunctivae normal.  Cardiovascular:     Rate and Rhythm: Normal rate and regular rhythm.     Heart sounds: No murmur heard. Pulmonary:     Effort: Pulmonary effort is normal. No respiratory distress.     Breath sounds: Normal breath sounds.  Musculoskeletal:        General: No swelling.     Cervical back: Neck supple.       Legs:  Skin:    General: Skin is warm and dry.     Capillary Refill: Capillary refill takes less than 2 seconds.  Neurological:     Mental Status: He is alert.  Psychiatric:        Mood and Affect: Mood normal.     ED Results / Procedures / Treatments   Labs (all labs ordered are listed, but only abnormal results are displayed) Labs Reviewed - No data to display  EKG None  Radiology No results found.  Procedures Procedures    Medications Ordered in ED Medications - No data to display  ED Course/ Medical Decision Making/ A&P                                 Medical Decision Making  43 year old male with past medical history of diabetes and a recent history of an abrasion on the right lower leg that ended up getting cellulitis and an abscess that was taken to the OR for washout with a wound VAC placed.  Presents to the emergency department today due to bleeding after the wound VAC was removed earlier this morning.  On exam patient is alert, stable vital signs.  Physical exam demonstrates Incision on the lateral left lower extremity.  No swelling, warmth, erythema, or purulent drainage.  Small  minimal oozing bleed towards the distal center.  Sutures intact.  Abdominal gauze and pressure dressing placed in with Ace wrap.  Patient evaluated the emergency department for approximately an hour and a half with no repeat bleeding.  Patient given supplies to return home with.  Patient in no distress and overall condition improved here in the ED. Detailed discussions were had with the patient regarding current findings, and need for close f/u with PCP or on call  doctor. The patient has been instructed to return immediately if the symptoms worsen in any way for re-evaluation. Patient verbalized understanding and is in agreement with current care plan. All questions answered prior to discharge.         Final Clinical Impression(s) / ED Diagnoses Final diagnoses:  Wound of right lower extremity, initial encounter    Rx / DC Orders ED Discharge Orders     None         Franne Forts, DO 02/28/24 2123

## 2024-02-28 NOTE — ED Triage Notes (Signed)
 Pt POV reporting leg pain and bleeding from site where wound vac was removed today. Bleeding noted through coban, wound redressed in triage.

## 2024-02-29 ENCOUNTER — Ambulatory Visit: Admitting: Pharmacist

## 2024-02-29 ENCOUNTER — Other Ambulatory Visit: Payer: Self-pay | Admitting: Family

## 2024-02-29 ENCOUNTER — Other Ambulatory Visit: Payer: Self-pay

## 2024-02-29 DIAGNOSIS — E119 Type 2 diabetes mellitus without complications: Secondary | ICD-10-CM

## 2024-02-29 NOTE — Telephone Encounter (Signed)
 Complete

## 2024-03-04 ENCOUNTER — Other Ambulatory Visit: Payer: Self-pay

## 2024-03-04 NOTE — Transitions of Care (Post Inpatient/ED Visit) (Signed)
 Transition of Care week 2  Visit Note  03/04/2024  Name: Ian Hughes MRN: [id number]          DOB: 1981-03-28  Situation: Patient enrolled in [facility] 30-day program. Visit completed with patient  by telephone.   Background:   Initial Transition Care Management Follow-up Telephone Call    Past Medical History:  Diagnosis Date   Cataract 2011   surgery on R eye   Diabetes mellitus without complication (HCC)    High cholesterol    Obesity     Assessment: Patient Reported Symptoms: Cognitive Cognitive Status: Alert and oriented to person, place, and time, Insightful and able to interpret abstract concepts, Normal speech and language skills      Neurological  No symptoms reported     HEENT HEENT Symptoms Reported: No symptoms reported      Cardiovascular Cardiovascular Symptoms Reported: Swelling in legs or feet Does patient have uncontrolled Hypertension?: No Cardiovascular Conditions: Hypertension Cardiovascular Management Strategies: Adequate rest, Diet modification, Medication therapy- He does have some LE swelling , r/t recent surgical wound  Weight: 242 lb (109.8 kg) (he does not weigh himself regularly) Cardiovascular Self-Management Outcome: 3 (uncertain)  Respiratory Respiratory Symptoms Reported: No symptoms reported    Endocrine Patient reports the following symptoms related to hypoglycemia or hyperglycemia : Shakiness, Weakness or fatigue Is patient diabetic?: Yes Is patient checking blood sugars at home?: Yes He uses Libre device His BG runs > 300 at times  He is om Insulin 13 units 1 hs and takes 1000 mg Metformin Endocrine Conditions: Diabetes Endocrine Management Strategies: Adequate rest, Coping strategies, Diet modification, Exercise, Medication therapy, Routine screening, Weight management Endocrine Self-Management Outcome: 2 (bad) Endocrine Comment: Has a referral for Endocrinology and also Amb referral to Pharmacy for Disease Management for DM ( A1C 14 %)   Gastrointestinal Gastrointestinal Symptoms Reported: No symptoms reported      Genitourinary      Integumentary Integumentary Symptoms Reported: Wound Additional Integumentary Details: Uses Medihoney and DD wrap with kling  suture removal scheduled for 4/17 Skin Conditions: Wound Skin Management Strategies: Adequate rest, Diet modification, Dressing changes, with Medihoney , DD and wrap with kling  Medication therapy Skin Self-Management Outcome: 4 (good)  Musculoskeletal Musculoskelatal Symptoms Reviewed: Unsteady gait, Weakness Additional Musculoskeletal Details: using walker Musculoskeletal Conditions: Joint pain, Mobility limited, Unsteady gait Musculoskeletal Management Strategies: Adequate rest, Diet modification, Weight management, Medication therapy Musculoskeletal Self-Management Outcome: 3 (uncertain) Falls in the past year?: No Number of falls in past year: 1 or less Patient at Risk for Falls Due to: Impaired mobility, Impaired balance/gait  Psychosocial Psychosocial Symptoms Reported: No symptoms reported Additional Psychological Details: does not eat red meats   Major Change/Loss/Stressor/Fears (CP): Medical condition, self Techniques to Cope with Loss/Stress/Change: Diversional activities, Medication Quality of Family Relationships: helpful, involved, supportive Do you feel physically threatened by others?: No   There were no vitals filed for this visit.  Medications Reviewed Today     Reviewed by Grafton Lawrence, RN (Registered Nurse) on 03/04/24 at 1041  Med List Status: <None>   Medication Order Taking? Sig Documenting Provider Last Dose Status Informant  acetaminophen (TYLENOL) 500 MG tablet [id number] Yes Take 500 mg by mouth every 6 (six) hours as needed. [provider] Taking Active Self  amLODipine (NORVASC) 5 MG tablet [id number] Yes Take 5 mg by mouth daily. [provider] Taking Active Self  atorvastatin (LIPITOR) 10 MG tablet [id number]  Yes Take 1 tablet (10 mg total) by mouth  daily. Rema Fendt, NP Taking Active Self   Patient not taking:   Discontinued 10/06/19 1134 Continuous Glucose Receiver (FREESTYLE LIBRE 2 READER) DEVI [id number] Yes Use to check blood sugar continuously throughout the day. E11.42 Hoy Register, MD Taking Active Self  Continuous Glucose Sensor (FREESTYLE LIBRE 2 SENSOR) MISC [id number] Yes Use to check blood sugar continuously throughout the day. Change sensors once every 14 days. I69.62 Hoy Register, MD Taking Active Self           Med Note Merlene Morse, KIMBERLY L   Thu Feb 14, 2024 10:14 AM) Doesn't have on right now  doxycycline (VIBRA-TABS) 100 MG tablet [id number] Yes Take 1 tablet (100 mg total) by mouth 2 (two) times daily. Nadara Mustard, MD Taking Active   gabapentin (NEURONTIN) 300 MG capsule [id number] Yes Take 1 capsule (300 mg total) by mouth at bedtime. Rema Fendt, NP Taking Active Self   Patient not taking:   Discontinued 10/06/19 1134 hydrochlorothiazide (HYDRODIURIL) 12.5 MG tablet [id number] Yes Take 1 tablet (12.5 mg total) by mouth daily. Rema Fendt, NP Taking Active Self  Insulin Glargine [facility] KWIKPEN) 100 UNIT/ML [id number] Yes Inject 13 Units into the skin at bedtime. Rema Fendt, NP Taking Active Self  Insulin Pen Needle 32G X 4 MM MISC [id number] Yes 1 each by Other route daily. Use to inject insulin once daily. Rema Fendt, NP Taking Active Self  isosorbide mononitrate (IMDUR) 60 MG 24 hr tablet [id number] Yes Take 60 mg by mouth daily. [provider] Taking Active   losartan (COZAAR) 50 MG tablet [id number] Yes Take 1 tablet (50 mg total) by mouth daily. Rema Fendt, NP Taking Active Self           Med Note (Rosene Pilling L   Tue Mar 04, 2024 10:34 AM) Needs refill  metFORMIN (GLUCOPHAGE-XR) 500 MG 24 hr tablet [id number] Yes Take 2 tablets (1,000 mg total) by mouth 2 (two) times daily with a meal. Rema Fendt, NP Taking Active Self   oxyCODONE-acetaminophen (PERCOCET/ROXICET) 5-325 MG tablet [id number] Yes Take 1 tablet by mouth every 4 (four) hours as needed. Nadara Mustard, MD Taking Active   TRUEplus Lancets 28G MISC [id number] Yes Use to check blood sugar 2 times a day Rema Fendt, NP Taking Active Self          Medication reconciliation/ review completed based on most recent medication list in EHR; and in review of recent Provider follow-up appointments- confirmed patient obtained / is taking all newly prescribed medications as instructed and is aware of any changes to previous medications regimen including dosage adjustments. Patient self-Caregiver manages medications; denies questions/ concerns or barriers to medication adherence at this time. Since last call new :   ?  ER Visits      4/10/ 2025 Location  [facility]   Follow-up items from  ER Visit / Hospital Stay  /2025 Reason for visit / New / Discharge Diagnoses  Bleeding s/p wound vac removal   Recommendation:   Specialty provider follow-up Suture removal 03/06/24 Schedule Endocrinology consult  Follow-up with Amb Referral for Disease Management with Pharmacist when scheduled   Follow Up Plan:   Telephone follow-up 03/11/24 with Estanislado Emms, RN   Susa Loffler , BSN, RN [facility]   VBCI-Population Health RN Care Manager Direct Dial [phone]  Fax: [phone] Website: [email or web address]

## 2024-03-04 NOTE — Patient Instructions (Addendum)
 Visit Information  Thank you for taking time to visit with me today. Please don't hesitate to contact me if I can be of assistance to you before your next scheduled telephone appointment.  Our next appointment is by telephone on 03/11/24 at 10:30 am with Arna Better   Following is a copy of your care plan:   Goals Addressed             This Visit's Progress    VBCI Transitions of Care (TOC) Care Plan       Problems:  Recent Hospitalization for treatment of DMII and LLE Cellulitis with Wound Vac- now removed   Diet/Nutrition/Food Resources A1C 14 % and Limited social support:    Goal:  Over the next 30 days, the patient will not experience hospital readmission  Interventions:  Transitions of Care:  New goal. Sick Day Rules Reviewed Doctor Visits  - discussed the importance of doctor visits Troubleshoot use of DME in the home  Post discharge activity limitations prescribed by provider reviewed Post-op wound/incision care reviewed with patient/caregiver Reviewed Signs and symptoms of infection  Diabetes Interventions:  (Status:  New goal.) Short Term Goal Assessed patient's understanding of A1c goal: <8% Provided education to patient about basic DM disease process Reviewed medications with patient and discussed importance of medication adherence Discussed plans with patient for ongoing care management follow up and provided patient with direct contact information for care management team Reviewed scheduled/upcoming provider appointments including: PCP scheduled visit with Care Guide assistance 02/27/24 - completed Next visit 04/23/24  Referral made to pharmacy team for assistance with prior to leaving hospital  Screening for signs and symptoms of depression related to chronic disease state  Assessed social determinant of health barriers Lab Results  Component Value Date   HGBA1C 14.1 (A) 01/22/2024    Wound Care (Status: New goal.) Short Term Goal Evaluation of current  treatment plan related to LLE cellulitis and wound vac placement   addressed questions about wound  care  reviewed medications with patient and addressed questions reviewed scheduled provider appointments with patient confirmed availability of transportation to all appointments   Patient Self Care Activities:  Attend all scheduled provider appointments Call pharmacy for medication refills 3-7 days in advance of running out of medications Call provider office for new concerns or questions  Participate in Transition of Care Program/Attend TOC scheduled calls Perform all self care activities independently  Perform IADL's (shopping, preparing meals, housekeeping, managing finances) independently Take medications as prescribed   Work with the pharmacist to address medication management needs and will continue to work with the clinical team to address health care and disease management related needs  Plan:  Telephone follow up appointment with care management team member scheduled for:  03/11/24 10:30am  The patient has been provided with contact information for the care management team and has been advised to call with any health related questions or concerns.        James Mcardle , BSN, RN [facility]   VBCI-Population Health RN Care Manager Direct Dial [phone]  Fax: [phone] Website: [email or web address]

## 2024-03-06 ENCOUNTER — Ambulatory Visit (INDEPENDENT_AMBULATORY_CARE_PROVIDER_SITE_OTHER): Admitting: Orthopedic Surgery

## 2024-03-06 DIAGNOSIS — L97911 Non-pressure chronic ulcer of unspecified part of right lower leg limited to breakdown of skin: Secondary | ICD-10-CM

## 2024-03-11 ENCOUNTER — Encounter: Payer: Self-pay | Admitting: Orthopedic Surgery

## 2024-03-11 ENCOUNTER — Other Ambulatory Visit: Payer: Self-pay | Admitting: *Deleted

## 2024-03-11 NOTE — Progress Notes (Signed)
 Office Visit Note   Patient: Ian Hughes           Date of Birth: 1981/03/24           MRN: [id number] Visit Date: 03/06/2024              Requested by: Senaida Dama, NP [address] [place],  [place] [address] PCP: Senaida Dama, NP  Chief Complaint  Patient presents with   Right Leg - Routine Post Op    4/2 and 02/22/24 I&D right leg      HPI: Patient is a 43 year old gentleman who is seen in follow-up status post irrigation debridement right leg on April 2 and April 4.  Patient states he went to the emergency room after the last visit secondary to bleeding.  Patient is currently on doxycycline .  He is using Medihoney on a small ulcer.  Assessment & Plan: Visit Diagnoses:  1. Ulcer of right lower extremity, limited to breakdown of skin (HCC)     Plan: Continue with dressing changes daily elevation and compression.  At follow-up anticipate we could advance him to a compression sock.  Anticipate return to work in about 6 months.  Follow-Up Instructions: Return in about 2 weeks (around 03/20/2024).   Ortho Exam  Patient is alert, oriented, no adenopathy, well-dressed, normal affect, normal respiratory effort. Examination the wound currently measures 2.5 x 8 cm.  There is hematoma at the base of the wound there is no cellulitis no drainage no ischemic changes.  There is a medial wound that is 1 x 2 cm.  Imaging: No results found.    Labs: Lab Results  Component Value Date   HGBA1C 14.1 (A) 01/22/2024   HGBA1C 12.8 (H) 01/08/2024   HGBA1C 12.7 (A) 09/19/2023   REPTSTATUS 02/25/2024 FINAL 02/20/2024   GRAMSTAIN  02/20/2024    FEW WBC PRESENT, PREDOMINANTLY PMN NO ORGANISMS SEEN    CULT  02/20/2024    FEW STAPHYLOCOCCUS AUREUS NO ANAEROBES ISOLATED Performed at [facility], [address] [place], [place] [address]    [facility] STAPHYLOCOCCUS AUREUS 02/20/2024     Lab Results  Component Value Date   ALBUMIN 3.3 (L) 02/14/2024   ALBUMIN  4.3 06/21/2020   ALBUMIN 4.3 12/12/2019    Lab Results  Component Value Date   MG 2.0 02/16/2024   Lab Results  Component Value Date   VD25OH 29 (L) 09/08/2014   VD25OH 36 08/21/2013   VD25OH 24 (L) 05/30/2012    No results found for: "PREALBUMIN"    Latest Ref Rng & Units 02/23/2024    5:19 AM 02/21/2024    6:24 AM 02/20/2024    5:45 AM  CBC EXTENDED  WBC 4.0 - 10.5 K/uL 10.7  11.4  8.1   RBC 4.22 - 5.81 MIL/uL 3.77  4.23  4.37   Hemoglobin 13.0 - 17.0 g/dL 86.5  78.4  69.6   HCT 39.0 - 52.0 % 31.1  34.7  35.3   Platelets 150 - 400 K/uL 370  403  400   NEUT# 1.7 - 7.7 K/uL   5.5   Lymph# 0.7 - 4.0 K/uL   1.4      There is no height or weight on file to calculate BMI.  Orders:  No orders of the defined types were placed in this encounter.  No orders of the defined types were placed in this encounter.    Procedures: No procedures performed  Clinical Data: No  additional findings.  ROS:  All other systems negative, except as noted in the HPI. Review of Systems  Objective: Vital Signs: There were no vitals taken for this visit.  Specialty Comments:  No specialty comments available.  PMFS History: Patient Active Problem List   Diagnosis Date Noted   Infected abrasion of right leg 02/20/2024   Abscess of right lower leg 02/18/2024   Cellulitis of right lower leg 02/14/2024   AKI (acute kidney injury) (HCC) 02/14/2024   Type 2 diabetes mellitus with diabetic polyneuropathy, with long-term current use of insulin  (HCC) 06/13/2022   Type 2 diabetes mellitus with hyperglycemia, with long-term current use of insulin  (HCC) 06/13/2022   Insulin  dependent type 2 diabetes mellitus (HCC) 02/11/2020   Glucosuria 02/11/2020   Non-restorative sleep 02/11/2020   Excessive daytime sleepiness 02/11/2020   Sleep related headaches 02/11/2020   Nocturia more than twice per night 02/11/2020   UARS (upper airway resistance syndrome) 04/07/2015   Severe obesity (BMI >= 40) (HCC)  04/07/2015   Primary snoring 04/07/2015   Snoring 12/21/2014   Drowsiness 12/21/2014   Witnessed apneic spells 12/21/2014   ED (erectile dysfunction) 09/24/2012   Type 2 diabetes mellitus with obesity (HCC) 07/04/2009    Class: Chronic   OBESITY, NOS 01/17/2007   BELLS PALSY 01/17/2007   HYPERTENSION, BENIGN SYSTEMIC 01/17/2007   Past Medical History:  Diagnosis Date   Cataract 2011   surgery on R eye   Diabetes mellitus without complication (HCC)    High cholesterol    Obesity     Family History  Problem Relation Age of Onset   Diabetes Mother    Cancer Father        leukemia    Obesity Other     Past Surgical History:  Procedure Laterality Date   EYE SURGERY Left 2011   cataract   EYE SURGERY Right 2012   INCISION AND DRAINAGE OF DEEP ABSCESS, CALF Right 02/20/2024   Procedure: INCISION AND DRAINAGE OF DEEP ABSCESS, CALF;  Surgeon: Timothy Ford, MD;  Location: [facility] OR;  Service: Orthopedics;  Laterality: Right;  RIGHT LEG DEBRIDEMENT   INCISION AND DRAINAGE OF WOUND Right 02/22/2024   Procedure: IRRIGATION AND DEBRIDEMENT WOUND;  Surgeon: Timothy Ford, MD;  Location: [facility] OR;  Service: Orthopedics;  Laterality: Right;  IRRIGATION AND DEBRIDEMENT RIGHT LEG   Social History   Occupational History    Employer: [employer]  Tobacco Use   Smoking status: Never    Passive exposure: Never   Smokeless tobacco: Never  Vaping Use   Vaping status: Never Used  Substance and Sexual Activity   Alcohol use: Yes    Comment: 1 or 2 every 3 months    Drug use: No   Sexual activity: Not on file

## 2024-03-11 NOTE — Progress Notes (Addendum)
 Office Visit Note   Patient: Ian Hughes           Date of Birth: [birth year]           MRN: [id number] Visit Date: 02/28/2024              Requested by: Ian Dama, NP [address] [place],  [place] [address] PCP: Ian Dama, NP  Chief Complaint  Patient presents with   Right Leg - Routine Post Op    4/2 and 02/22/24 I&D right leg      HPI: Patient is a 43 year old gentleman who is status post irrigation debridement right lower extremity on April 2 and April 4.  Patient had the wound VAC removed and went to the emergency room on April 10.  Patient is currently on doxycycline .  Assessment & Plan: Visit Diagnoses: No diagnosis found.  Plan: Dial soap cleansing daily dry dressing changes with compression.  Discussed the importance of elevation and compression.  Anticipated follow-up in 2 weeks could change to a compression sock.  Anticipate out of work for 6 months.  Follow-Up Instructions: Return in about 2 weeks (around 03/13/2024).   Ortho Exam  Patient is alert, oriented, no adenopathy, well-dressed, normal affect, normal respiratory effort. Examination the wound currently measures 2.5 x 8 cm with some bloody drainage at the base of the wound there is some Kerecis that has not incorporated yet.  No cellulitis no odor no necrotic changes.  There is a flat medial wound that is 3 x 3 cm.  Imaging: No results found.    Labs: Lab Results  Component Value Date   HGBA1C 14.1 (A) 01/22/2024   HGBA1C 12.8 (H) 01/08/2024   HGBA1C 12.7 (A) 09/19/2023   REPTSTATUS 02/25/2024 FINAL 02/20/2024   GRAMSTAIN  02/20/2024    FEW WBC PRESENT, PREDOMINANTLY PMN NO ORGANISMS SEEN    CULT  02/20/2024    FEW STAPHYLOCOCCUS AUREUS NO ANAEROBES ISOLATED Performed at [facility], [address], [place], [place] [address]    [facility] STAPHYLOCOCCUS AUREUS 02/20/2024     Lab Results  Component Value Date   ALBUMIN 3.3 (L) 02/14/2024   ALBUMIN 4.3  06/21/2020   ALBUMIN 4.3 12/12/2019    Lab Results  Component Value Date   MG 2.0 02/16/2024   Lab Results  Component Value Date   VD25OH 29 (L) 09/08/2014   VD25OH 36 08/21/2013   VD25OH 24 (L) 05/30/2012    No results found for: "PREALBUMIN"    Latest Ref Rng & Units 02/23/2024    5:19 AM 02/21/2024    6:24 AM 02/20/2024    5:45 AM  CBC EXTENDED  WBC 4.0 - 10.5 K/uL 10.7  11.4  8.1   RBC 4.22 - 5.81 MIL/uL 3.77  4.23  4.37   Hemoglobin 13.0 - 17.0 g/dL 52.8  41.3  24.4   HCT 39.0 - 52.0 % 31.1  34.7  35.3   Platelets 150 - 400 K/uL 370  403  400   NEUT# 1.7 - 7.7 K/uL   5.5   Lymph# 0.7 - 4.0 K/uL   1.4      There is no height or weight on file to calculate BMI.  Orders:  No orders of the defined types were placed in this encounter.  No orders of the defined types were placed in this encounter.    Procedures: No procedures performed  Clinical Data: No additional findings.  ROS:  All other  systems negative, except as noted in the HPI. Review of Systems  Objective: Vital Signs: There were no vitals taken for this visit.  Specialty Comments:  No specialty comments available.  PMFS History: Patient Active Problem List   Diagnosis Date Noted   Infected abrasion of right leg 02/20/2024   Abscess of right lower leg 02/18/2024   Cellulitis of right lower leg 02/14/2024   AKI (acute kidney injury) (HCC) 02/14/2024   Type 2 diabetes mellitus with diabetic polyneuropathy, with long-term current use of insulin  (HCC) 06/13/2022   Type 2 diabetes mellitus with hyperglycemia, with long-term current use of insulin  (HCC) 06/13/2022   Insulin  dependent type 2 diabetes mellitus (HCC) 02/11/2020   Glucosuria 02/11/2020   Non-restorative sleep 02/11/2020   Excessive daytime sleepiness 02/11/2020   Sleep related headaches 02/11/2020   Nocturia more than twice per night 02/11/2020   UARS (upper airway resistance syndrome) 04/07/2015   Severe obesity (BMI >= 40) (HCC)  04/07/2015   Primary snoring 04/07/2015   Snoring 12/21/2014   Drowsiness 12/21/2014   Witnessed apneic spells 12/21/2014   ED (erectile dysfunction) 09/24/2012   Type 2 diabetes mellitus with obesity (HCC) 07/04/2009    Class: Chronic   OBESITY, NOS 01/17/2007   BELLS PALSY 01/17/2007   HYPERTENSION, BENIGN SYSTEMIC 01/17/2007   Past Medical History:  Diagnosis Date   Cataract 2011   surgery on R eye   Diabetes mellitus without complication (HCC)    High cholesterol    Obesity     Family History  Problem Relation Age of Onset   Diabetes Mother    Cancer Father        leukemia    Obesity Other     Past Surgical History:  Procedure Laterality Date   EYE SURGERY Left 2011   cataract   EYE SURGERY Right 2012   INCISION AND DRAINAGE OF DEEP ABSCESS, CALF Right 02/20/2024   Procedure: INCISION AND DRAINAGE OF DEEP ABSCESS, CALF;  Surgeon: Timothy Ford, MD;  Location: [facility] OR;  Service: Orthopedics;  Laterality: Right;  RIGHT LEG DEBRIDEMENT   INCISION AND DRAINAGE OF WOUND Right 02/22/2024   Procedure: IRRIGATION AND DEBRIDEMENT WOUND;  Surgeon: Timothy Ford, MD;  Location[facility] OR;  Service: Orthopedics;  Laterality: Right;  IRRIGATION AND DEBRIDEMENT RIGHT LEG   Social History   Occupational History    Employer: [employer]  Tobacco Use   Smoking status: Never    Passive exposure: Never   Smokeless tobacco: Never  Vaping Use   Vaping status: Never Used  Substance and Sexual Activity   Alcohol use: Yes    Comment: 1 or 2 every 3 months    Drug use: No   Sexual activity: Not on file

## 2024-03-11 NOTE — Transitions of Care (Post Inpatient/ED Visit) (Signed)
 Transition of Care week 3  Visit Note  03/11/2024  Name: Ian Hughes MRN: [id number]          DOB: 29-Nov-1980  Situation: Patient enrolled in [facility] 30-day program. Visit completed with Ian Hughes by telephone.   Background:   Initial Transition Care Management Follow-up Telephone Call    Past Medical History:  Diagnosis Date   Cataract 2011   surgery on R eye   Diabetes mellitus without complication (HCC)    High cholesterol    Obesity     Assessment: Patient Reported Symptoms: Cognitive Cognitive Status: Able to follow simple commands, Alert and oriented to person, place, and time, Insightful and able to interpret abstract concepts, Normal speech and language skills      Neurological Neurological Review of Symptoms: No symptoms reported    HEENT HEENT Symptoms Reported: No symptoms reported      Cardiovascular Cardiovascular Symptoms Reported: No symptoms reported Does patient have uncontrolled Hypertension?: Yes Is patient checking Blood Pressure at home?: No Cardiovascular Conditions: Hypertension Cardiovascular Management Strategies: Medication therapy, Routine screening, Diet modification Cardiovascular Self-Management Outcome: 3 (uncertain) Cardiovascular Comment: RNCM advised patient to start checking BP at home and record, encouraged light exercise like walking 30 minutes daily  Respiratory Respiratory Symptoms Reported: No symptoms reported    Endocrine Patient reports the following symptoms related to hypoglycemia or hyperglycemia : No symptoms reported Is patient diabetic?: Yes Is patient checking blood sugars at home?: Yes Endocrine Conditions: Diabetes Endocrine Management Strategies: Diet modification, Routine screening, Medication therapy, Medical device Endocrine Self-Management Outcome: 2 (bad) Endocrine Comment: Scheduled with Endocrinology on 05/05/24. Patient will call today to request earlier appointment. RNCM will message PCP to request FreeStyle  Libre 3 sensors  Gastrointestinal Gastrointestinal Symptoms Reported: No symptoms reported      Genitourinary Genitourinary Symptoms Reported: No symptoms reported    Integumentary Integumentary Symptoms Reported: Wound Additional Integumentary Details: Using Medihoney and packing wound with gauze saturated with Vashe cleanser as instructed by Dr. Julio Ohm on 03/06/24. Follow up scheduled on 03/20/24 Skin Conditions: Wound Skin Management Strategies: Medication therapy, Routine screening, Dressing changes, Diet modification Skin Self-Management Outcome: 4 (good)  Musculoskeletal Musculoskelatal Symptoms Reviewed: No symptoms reported Additional Musculoskeletal Details: No longer needing to use walker   Falls in the past year?: No Patient at Risk for Falls Due to: No Fall Risks  Psychosocial Psychosocial Symptoms Reported: No symptoms reported         There were no vitals filed for this visit.  Medications Reviewed Today     Reviewed by Aura Leeds, RN (Registered Nurse) on 03/11/24 at 1054  Med List Status: <None>   Medication Order Taking? Sig Documenting Provider Last Dose Status Informant  acetaminophen  (TYLENOL ) 500 MG tablet [id number] Yes Take 500 mg by mouth every 6 (six) hours as needed. [provider] Taking Active Self  amLODipine  (NORVASC ) 5 MG tablet [id number] Yes Take 5 mg by mouth daily. [provider] Taking Active Self  atorvastatin  (LIPITOR) 10 MG tablet [id number] Yes Take 1 tablet (10 mg total) by mouth daily. Senaida Dama, NP Taking Active Self   Patient not taking:   Discontinued 10/06/19 1134 Continuous Glucose Receiver (FREESTYLE LIBRE 2 READER) DEVI [id number] No Use to check blood sugar continuously throughout the day. E11.42  Patient not taking: Reported on 03/11/2024   Newlin, Enobong, MD Not Taking Active Self  Continuous Glucose Sensor (FREESTYLE LIBRE 2 SENSOR) MISC [id number] No Use to check blood sugar continuously throughout the day.  Change sensors once every 14 days. E11.42  Patient not taking: Reported on 03/11/2024   Newlin, Enobong, MD Not Taking Active Self           Med Note Andee Kato, KIMBERLY L   Thu Feb 14, 2024 10:14 AM) Doesn't have on right now  doxycycline  (VIBRA -TABS) 100 MG tablet [id number] Yes Take 1 tablet (100 mg total) by mouth 2 (two) times daily. Timothy Ford, MD Taking Active   gabapentin  (NEURONTIN ) 300 MG capsule [id number] Yes Take 1 capsule (300 mg total) by mouth at bedtime. Senaida Dama, NP Taking Active Self   Patient not taking:   Discontinued 10/06/19 1134 hydrochlorothiazide  (HYDRODIURIL ) 12.5 MG tablet [id number] Yes Take 1 tablet (12.5 mg total) by mouth daily. Senaida Dama, NP Taking Active Self  Insulin  Glargine (BASAGLAR  KWIKPEN) 100 UNIT/ML [id number] Yes Inject 13 Units into the skin at bedtime. Senaida Dama, NP Taking Active Self  Insulin  Pen Needle 32G X 4 MM MISC [id number] Yes 1 each by Other route daily. Use to inject insulin  once daily. Senaida Dama, NP Taking Active Self  isosorbide mononitrate (IMDUR) 60 MG 24 hr tablet [id number] No Take 60 mg by mouth daily.  Patient not taking: Reported on 03/11/2024   [provider] Not Taking Active   losartan  (COZAAR ) 50 MG tablet [id number] Yes Take 1 tablet (50 mg total) by mouth daily. Senaida Dama, NP Taking Active Self           Med Note (Meital Riehl A   Tue Mar 11, 2024 10:49 AM)    metFORMIN  (GLUCOPHAGE -XR) 500 MG 24 hr tablet [id number] Yes Take 2 tablets (1,000 mg total) by mouth 2 (two) times daily with a meal. Senaida Dama, NP Taking Active Self  oxyCODONE -acetaminophen  (PERCOCET/ROXICET) 5-325 MG tablet [id number] No Take 1 tablet by mouth every 4 (four) hours as needed.  Patient not taking: Reported on 03/11/2024   Timothy Ford, MD Not Taking Active   TRUEplus Lancets 28G MISC [id number] Yes Use to check blood sugar 2 times a day Senaida Dama, NP Taking Active Self            Recommendation:    none  Follow Up Plan:   Telephone follow-up in 1 week RNCM will send secure message to PCP requesting refills for [facility] [place] 3 sensors  Arna Better RN, BSN [place]  [facility] Health RN Care Manager [phone]

## 2024-03-11 NOTE — Patient Instructions (Signed)
 Visit Information  Thank you for taking time to visit with me today. Please don't hesitate to contact me if I can be of assistance to you.   Following are the goals we discussed today:    Goals Addressed             This Visit's Progress    VBCI Transitions of Care (TOC) Care Plan       Problems:  Recent Hospitalization for treatment of DMII and RLE Cellulitis with Wound Vac- now removed   Diet/Nutrition/Food Resources A1C 14 % and Limited social support:    Goal:  Over the next 30 days, the patient will not experience hospital readmission  Interventions:  Transitions of Care:  Goal on track:  Yes. Doctor Visits  - discussed the importance of doctor visits Contacted provider for patient needs message to A. Rogerio Clay, NP requesting refill for [employer] 3 sensors [employer] use of DME in the home  Post discharge activity limitations prescribed by provider reviewed Post-op wound/incision care reviewed with patient/caregiver Reviewed Signs and symptoms of infection  Diabetes Interventions:  (Status:  Goal on track:  Yes.) Short Term Goal Assessed patient's understanding of A1c goal: <8% Reviewed medications with patient and discussed importance of medication adherence Discussed plans with patient for ongoing care management follow up and provided patient with direct contact information for care management team Reviewed scheduled/upcoming provider appointments including: PCP on 04/23/24 and Endocrinology on 05/05/24 Review of patient status, including review of consultants reports, relevant laboratory and other test results, and medications completed Screening for signs and symptoms of depression related to chronic disease state  Advised patient to engage in light exercise 30 minutes a day for at least 5 days a week Advised patient to keep a log of BS readings and foods eaten to correlate readings to food selections Advised patient to include lean protein with meals and snacks  and portion control Lab Results  Component Value Date   HGBA1C 14.1 (A) 01/22/2024    Wound Care (Status: Goal on track:  Yes.) Short Term Goal Evaluation of current treatment plan related to RLE cellulitis and wound vac placement     reviewed medications with patient and addressed questions reviewed scheduled provider appointments with patient-next visit with Dr. Julio Ohm on 03/20/24 Discussed dressing changes, ensuring patient performing daily   Patient Self Care Activities:  Attend all scheduled provider appointments Call pharmacy for medication refills 3-7 days in advance of running out of medications Call provider office for new concerns or questions  Participate in Transition of Care Program/Attend TOC scheduled calls Perform all self care activities independently  Perform IADL's (shopping, preparing meals, housekeeping, managing finances) independently Take medications as prescribed   Work with the pharmacist to address medication management needs and will continue to work with the clinical team to address health care and disease management related needs  Plan:  Telephone follow up appointment with care management team member scheduled for:  03/18/24 11:30am with Candice Chalet, RN  The patient has been provided with contact information for the care management team and has been advised to call with any health related questions or concerns.          Our next appointment is by telephone on 03/18/24 at 11:30am  If you are experiencing a Mental Health or Behavioral Health Crisis or need someone to talk to, please call 1-800-273-TALK (toll free, 24 hour hotline) go to [facility] [address], [place] [phone]) call 911   Patient  verbalizes understanding of instructions and care plan provided today and agrees to view in MyChart. Active MyChart status and patient understanding of how to access instructions and care plan via MyChart confirmed with  patient.     Telephone follow up appointment with care management team member scheduled for:03/18/24 at 11:30am  Arna Better RN, BSN [place]  [facility] Health RN Care Manager [phone]

## 2024-03-18 ENCOUNTER — Other Ambulatory Visit: Payer: Self-pay

## 2024-03-18 ENCOUNTER — Other Ambulatory Visit: Payer: Self-pay | Admitting: Family

## 2024-03-18 DIAGNOSIS — E1142 Type 2 diabetes mellitus with diabetic polyneuropathy: Secondary | ICD-10-CM

## 2024-03-18 NOTE — Transitions of Care (Post Inpatient/ED Visit) (Signed)
 Transition of Care week 4  Visit Note  03/18/2024  Name: Ian Hughes MRN: [id number]          DOB: 1981-10-17  Situation: Patient enrolled in [facility] 30-day program. Visit completed with Ian Hughes by telephone.   Background:    Past Medical History:  Diagnosis Date   Cataract 2011   surgery on R eye   Diabetes mellitus without complication (HCC)    High cholesterol    Obesity     Assessment: Patient Reported Symptoms: Cognitive Cognitive Status: Alert and oriented to person, place, and time   Health Maintenance Behaviors: Annual physical exam, Sleep adequate  Neurological Neurological Review of Symptoms: No symptoms reported    HEENT HEENT Symptoms Reported: No symptoms reported      Cardiovascular Cardiovascular Symptoms Reported: No symptoms reported Does patient have uncontrolled Hypertension?: Yes Is patient checking Blood Pressure at home?: No Cardiovascular Conditions: Hypertension Cardiovascular Management Strategies: Medication therapy, Diet modification Weight: 242 lb (109.8 kg) Cardiovascular Self-Management Outcome: 4 (good) Cardiovascular Comment: Stable  Respiratory Respiratory Symptoms Reported: No symptoms reported    Endocrine Patient reports the following symptoms related to hypoglycemia or hyperglycemia : No symptoms reported Is patient diabetic?: Yes Is patient checking blood sugars at home?: Yes Endocrine Conditions: Diabetes Endocrine Management Strategies: Medication therapy, Medical device, Diet modification Endocrine Self-Management Outcome: 4 (good) Endocrine Comment: Stable. Message sent to provider for FreeStyle Libre 3 Sensors  Gastrointestinal Gastrointestinal Symptoms Reported: No symptoms reported   Nutrition Risk Screen (CP): No indicators present  Genitourinary Genitourinary Symptoms Reported: No symptoms reported    Integumentary Integumentary Symptoms Reported: Wound Additional Integumentary Details: Still using Medihoney and  packing wound with gauze. the patient states the wound is healing Skin Conditions: Wound Skin Management Strategies: Routine screening, Medication therapy, Dressing changes Skin Self-Management Outcome: 4 (good) Skin Comment: The wound is improving  Musculoskeletal Musculoskelatal Symptoms Reviewed: No symptoms reported Musculoskeletal Conditions: Joint pain Musculoskeletal Management Strategies: Weight management, Medication therapy Musculoskeletal Self-Management Outcome: 4 (good) Musculoskeletal Comment: Improving. The patient is not using a walker and states his leg is sore but does not need pain medication Falls in the past year?: No Number of falls in past year: 1 or less Was there an injury with Fall?: No Fall Risk Category Calculator: 0 Patient Fall Risk Level: Low Fall Risk Patient at Risk for Falls Due to: No Fall Risks  Psychosocial Psychosocial Symptoms Reported: No symptoms reported         There were no vitals filed for this visit.  Medications Reviewed Today     Reviewed by Ian Crystal, RN (Case Manager) on 03/18/24 at 1206  Med List Status: <None>   Medication Order Taking? Sig Documenting Provider Last Dose Status Informant  acetaminophen  (TYLENOL ) 500 MG tablet [id number]  Take 500 mg by mouth every 6 (six) hours as needed. [provider]  Active Self  amLODipine  (NORVASC ) 5 MG tablet [id number]  Take 5 mg by mouth daily. [provider]  Active Self  atorvastatin  (LIPITOR) 10 MG tablet [id number]  Take 1 tablet (10 mg total) by mouth daily. Ian Dama, NP  Active Self   Patient not taking:   Discontinued 10/06/19 1134 Continuous Glucose Receiver (FREESTYLE LIBRE 2 READER) DEVI [id number]  Use to check blood sugar continuously throughout the day. E11.42  Patient not taking: Reported on 03/11/2024   Hughes, Enobong, MD  Active Self  Continuous Glucose Sensor (FREESTYLE LIBRE 2 SENSOR) [place] [id number]  Use to check blood sugar  continuously  throughout the day. Change sensors once every 14 days. E11.42  Patient not taking: Reported on 03/11/2024   Hughes, Enobong, MD  Active Self           Med Note Ian Hughes, Ian Hughes   Thu Feb 14, 2024 10:14 AM) Doesn't have on right now  doxycycline  (VIBRA -TABS) 100 MG tablet [id number]  Take 1 tablet (100 mg total) by mouth 2 (two) times daily. Ian Ford, MD  Active   gabapentin  (NEURONTIN ) 300 MG capsule [id number]  Take 1 capsule (300 mg total) by mouth at bedtime. Ian Dama, NP  Active Self   Patient not taking:   Discontinued 10/06/19 1134 hydrochlorothiazide  (HYDRODIURIL ) 12.5 MG tablet [id number]  Take 1 tablet (12.5 mg total) by mouth daily. Ian Dama, NP  Active Self  Insulin  Glargine (BASAGLAR  KWIKPEN) 100 UNIT/ML [id number]  Inject 13 Units into the skin at bedtime. Ian Dama, NP  Active Self  Insulin  Pen Needle 32G X 4 MM MISC [id number]  1 each by Other route daily. Use to inject insulin  once daily. Ian Dama, NP  Active Self  isosorbide mononitrate (IMDUR) 60 MG 24 hr tablet [id number]  Take 60 mg by mouth daily.  Patient not taking: Reported on 03/11/2024   [provider]  Active   losartan  (COZAAR ) 50 MG tablet [id number]  Take 1 tablet (50 mg total) by mouth daily. Ian Dama, NP  Active Self           Med Note (Hughes, Ian A   Tue Mar 11, 2024 10:49 AM)    metFORMIN  (GLUCOPHAGE -XR) 500 MG 24 hr tablet [id number]  Take 2 tablets (1,000 mg total) by mouth 2 (two) times daily with a meal. Ian Dama, NP  Active Self  oxyCODONE -acetaminophen  (PERCOCET/ROXICET) 5-325 MG tablet [id number] No Take 1 tablet by mouth every 4 (four) hours as needed.  Patient not taking: Reported on 03/18/2024   Ian Ford, MD Not Taking Active   TRUEplus Lancets 28G MISC [id number]  Use to check blood sugar 2 times a day Ian Dama, NP  Active Self            Recommendation:   Specialty provider follow-up Ian Hughes on 03/20/24 Contact RNCM if the  pharmacy does not reach out to him on 03/18/24 regarding refill on [employer] Elevating RLE at rest Continue with dressing changes to RLE as instructed by his provider Continue with ABX three times a week until otherwise instructed by the provider Monitor blood glucose at least once a day Report increased pain or drainage to RLE wound site  Follow Up Plan:   Telephone follow-up in 1 week   Gareld June, BSN, RN [place]  VBCI - [facility] Health RN Care Manager [phone]

## 2024-03-18 NOTE — Patient Instructions (Signed)
 Visit Information  Thank you for taking time to visit with me today. Please don't hesitate to contact me if I can be of assistance to you before our next scheduled telephone appointment.  Our next appointment is by telephone on Tuesday May 6th at 12:30pm  Following is a copy of your care plan:   Goals Addressed             This Visit's Progress    VBCI Transitions of Care (TOC) Care Plan   On track    Problems: (reviewed 03/18/24) Recent Hospitalization for treatment of DMII and RLE Cellulitis with Wound Vac- now removed   Diet/Nutrition/Food Resources A1C 14 % and Limited social support:    Goal:  Over the next 30 days, the patient will not experience hospital readmission  Interventions: (reviewed 03/18/24) Transitions of Care:  Goal on track:  Yes. Doctor Visits  - discussed the importance of doctor visits Contacted provider for patient needs message to A. Rogerio Clay, NP requesting refill for [employer] 3 sensors - another message sent 03/18/24 because the patient did not have the sensors [employer] use of DME in the home  Post discharge activity limitations prescribed by provider reviewed Post-op wound/incision care reviewed with patient/caregiver Reviewed Signs and symptoms of infection  Diabetes Interventions:  (Status:  Goal on track:  Yes.) Short Term Goal (reviewed 03/18/24) Assessed patient's understanding of A1c goal: <8% Reviewed medications with patient and discussed importance of medication adherence Discussed plans with patient for ongoing care management follow up and provided patient with direct contact information for care management team Reviewed scheduled/upcoming provider appointments including: PCP on 04/23/24 and Endocrinology on 05/05/24 Review of patient status, including review of consultants reports, relevant laboratory and other test results, and medications completed Screening for signs and symptoms of depression related to chronic disease state   Advised patient to engage in light exercise 30 minutes a day for at least 5 days a week Advised patient to keep a log of BS readings and foods eaten to correlate readings to food selections Advised patient to include lean protein with meals and snacks and portion control Lab Results  Component Value Date   HGBA1C 14.1 (A) 01/22/2024    Wound Care (Status: Goal on track:  Yes.) Short Term Goal (reviewed 03/18/24) Evaluation of current treatment plan related to RLE cellulitis and wound vac placement     reviewed medications with patient and addressed questions reviewed scheduled provider appointments with patient-next visit with Dr. Julio Ohm on 03/20/24 Discussed dressing changes, ensuring patient performing daily   Patient Self Care Activities: (reviewed 03/18/24) Attend all scheduled provider appointments Call pharmacy for medication refills 3-7 days in advance of running out of medications Call provider office for new concerns or questions  Participate in Transition of Care Program/Attend TOC scheduled calls Perform all self care activities independently  Perform IADL's (shopping, preparing meals, housekeeping, managing finances) independently Take medications as prescribed   Work with the pharmacist to address medication management needs and will continue to work with the clinical team to address health care and disease management related needs  Plan:  Telephone follow up appointment with care management team member scheduled for:  03/25/24 at 12:30pm The patient has been provided with contact information for the care management team and has been advised to call with any health related questions or concerns.         Patient verbalizes understanding of instructions and care plan provided today and agrees to view in MyChart. Active MyChart status  and patient understanding of how to access instructions and care plan via MyChart confirmed with patient.     The patient has been provided with  contact information for the care management team and has been advised to call with any health related questions or concerns.   Please call the care guide team at [phone] if you need to cancel or reschedule your appointment.   Please call the Suicide and Crisis Lifeline: 988 call the [place]  National Suicide Prevention Lifeline: 828-653-5489 or TTY: (717)698-7122 TTY 443-557-8735) to talk to a trained counselor if you are experiencing a Mental Health or Behavioral Health Crisis or need someone to talk to.  Gareld June, BSN, RN [place]  VBCI - [employer] Health RN Care Manager [phone]

## 2024-03-18 NOTE — Telephone Encounter (Signed)
 Copied from CRM [id number]. Topic: Clinical - Medication Refill >> Mar 18, 2024 11:52 AM [employer] wrote: Most Recent Primary Care Visit:  Provider: Lavona Pounds J  Department: [facility]  Visit Type: HOSPITAL FOLLOW UP  Date: 02/27/2024  Medication: Continuous Glucose Receiver (FREESTYLE LIBRE 2 READER) DEVI Continuous Glucose Sensor (FREESTYLE LIBRE 2 SENSOR) MISC    Has the patient contacted their pharmacy? Yes (Agent: If no, request that the patient contact the pharmacy for the refill. If patient does not wish to contact the pharmacy document the reason why and proceed with request.) (Agent: If yes, when and what did the pharmacy advise?)  Is this the correct pharmacy for this prescription? Yes If no, delete pharmacy and type the correct one.  This is the patient's preferred pharmacy:   [id number] - [place], [place] - [address] AT [facility] OF [address] [place] [address] Phone: [phone] Fax: [phone]  Has the prescription been filled recently? Yes  Is the patient out of the medication? Yes  Has the patient been seen for an appointment in the last year OR does the patient have an upcoming appointment? Yes  Can we respond through MyChart? Yes  Agent: Please be advised that Rx refills may take up to 3 business days. We ask that you follow-up with your pharmacy.

## 2024-03-19 ENCOUNTER — Other Ambulatory Visit: Payer: Self-pay | Admitting: Family Medicine

## 2024-03-19 DIAGNOSIS — E1142 Type 2 diabetes mellitus with diabetic polyneuropathy: Secondary | ICD-10-CM

## 2024-03-20 ENCOUNTER — Ambulatory Visit (INDEPENDENT_AMBULATORY_CARE_PROVIDER_SITE_OTHER): Admitting: Orthopedic Surgery

## 2024-03-20 DIAGNOSIS — L97911 Non-pressure chronic ulcer of unspecified part of right lower leg limited to breakdown of skin: Secondary | ICD-10-CM

## 2024-03-20 NOTE — Telephone Encounter (Signed)
 Requested medication (s) are due for refill today - expired Rx  Requested medication (s) are on the active medication list -yes  Future visit scheduled -no  Last refill: 03/16/23  Notes to clinic: Patient is requesting new Rx- awaiting endocrine referral appointment, OV 02/27/24- hospital f/u  Requested Prescriptions  Pending Prescriptions Disp Refills   Continuous Glucose Receiver (FREESTYLE LIBRE 2 READER) DEVI 1 each 0    Sig: Use to check blood sugar continuously throughout the day. E11.42     Endocrinology: Diabetes - Testing Supplies Passed - 03/20/2024  4:28 PM      Passed - Valid encounter within last 12 months    Recent Outpatient Visits           3 weeks ago Hospital discharge follow-up   [facility], [place], [place]   1 month ago Primary hypertension   [place] [facility], [place], [place]   2 months ago Primary hypertension   [place] [facility], Amy J, NP   5 months ago Primary hypertension   [place] [facility] at [facility], [place], [place]   6 months ago Primary hypertension   [place] [facility], Annalee Barren, NP       Future Appointments             In 2 weeks [place], Peter M, MD [facility] at [facility] A Dept of [employer], H&[facility]             Continuous Glucose Sensor (FREESTYLE LIBRE 2 SENSOR) MISC 2 each 4    Sig: Use to check blood sugar continuously throughout the day. Change sensors once every 14 days. E11.42     Endocrinology: Diabetes - Testing Supplies Passed - 03/20/2024  4:28 PM      Passed - Valid encounter within last 12 months    Recent Outpatient Visits           3 weeks ago Hospital discharge follow-up   [facility], [place], [place]   1 month ago Primary hypertension   [place] [facility], [place], [place]   2 months ago Primary  hypertension   [place] [facility], Amy J, NP   5 months ago Primary hypertension   [place] [facility], [place], [place]   6 months ago Primary hypertension   [place] [facility] at [facility], Annalee Barren, NP       Future Appointments             In 2 weeks [place], Peter M, MD [facility] at [facility] A Dept of [employer]. [facility], H&[facility]               Requested Prescriptions  Pending Prescriptions Disp Refills   Continuous Glucose Receiver (FREESTYLE LIBRE 2 READER) DEVI 1 each 0    Sig: Use to check blood sugar continuously throughout the day. E11.42     Endocrinology: Diabetes - Testing Supplies Passed - 03/20/2024  4:28 PM      Passed - Valid encounter within last 12 months    Recent Outpatient Visits           3 weeks ago Hospital discharge follow-up   [facility] at [facility], Amy J, NP   1 month ago Primary hypertension   [place] [facility], [place], [place]   2 months ago Primary hypertension   [place] [facility] at [facility], [place], [place]   5 months ago Primary hypertension   [place] [facility] at [facility], [place], [place]   6 months ago Primary hypertension   [place] [facility] at [facility], Annalee Barren, NP       Future Appointments             In 2 weeks [place], Peter M, MD [facility] at [facility] A Dept of [employer], [facility]             Continuous Glucose Sensor (FREESTYLE LIBRE 2 SENSOR) MISC 2 each 4    Sig: Use to check blood sugar continuously throughout the day. Change sensors once every 14 days. E11.42     Endocrinology: Diabetes - Testing Supplies Passed - 03/20/2024  4:28 PM      Passed - Valid encounter within last 12 months    Recent Outpatient Visits           3 weeks ago Hospital discharge follow-up   [facility], [place], [place]   1 month ago Primary hypertension   [place] [facility] at [facility], [place], [place]   2 months ago Primary hypertension   [place] [facility] at [facility], Amy J, NP   5 months ago Primary hypertension   [place] [facility] at [facility], [place], [place]   6 months ago Primary hypertension    Primary [facility] at [facility], Annalee Barren, NP       Future Appointments             In 2 weeks [place], Peter M, MD [facility] at [facility] Dept of [employer]. [facility], H&[facility]

## 2024-03-21 ENCOUNTER — Encounter: Payer: Self-pay | Admitting: Orthopedic Surgery

## 2024-03-21 NOTE — Progress Notes (Signed)
 Office Visit Note   Patient: Ian Hughes           Date of Birth: 1981/03/10           MRN: [id number] Visit Date: 03/20/2024              Requested by: Senaida Dama, NP [address] [place],  [place] [address] PCP: Senaida Dama, NP  Chief Complaint  Patient presents with   Right Leg - Routine Post Op    4/2 and 02/22/24 I&D right leg      HPI: Patient is a 43 year old gentleman who is status post irrigation debridement right leg on April 2 and April 4.  He is approximately 4 weeks out from surgery.  Assessment & Plan: Visit Diagnoses:  1. Ulcer of right lower extremity, limited to breakdown of skin (HCC)     Plan: Sutures harvested today.  Recommended a size extra-large compression sock.  Follow-Up Instructions: Return in about 2 weeks (around 04/03/2024).   Ortho Exam  Patient is alert, oriented, no adenopathy, well-dressed, normal affect, normal respiratory effort. Examination patient has a lateral ulcer on the right leg that measures 3 x 7 cm with healthy granulation tissue there is a medial ulcer that also has healthy granulation tissue that measures 1 x 1.5 cm.  Most recent hemoglobin A1c 14.1.  Calf measures 45 cm in circumference.  Imaging: No results found.    Labs: Lab Results  Component Value Date   HGBA1C 14.1 (A) 01/22/2024   HGBA1C 12.8 (H) 01/08/2024   HGBA1C 12.7 (A) 09/19/2023   REPTSTATUS 02/25/2024 FINAL 02/20/2024   GRAMSTAIN  02/20/2024    FEW WBC PRESENT, PREDOMINANTLY PMN NO ORGANISMS SEEN    CULT  02/20/2024    FEW STAPHYLOCOCCUS AUREUS NO ANAEROBES ISOLATED Performed at [facility], [address], [place], [place] [address]    [facility] STAPHYLOCOCCUS AUREUS 02/20/2024     Lab Results  Component Value Date   ALBUMIN 3.3 (L) 02/14/2024   ALBUMIN 4.3 06/21/2020   ALBUMIN 4.3 12/12/2019    Lab Results  Component Value Date   MG 2.0 02/16/2024   Lab Results  Component Value Date   VD25OH 29 (L)  09/08/2014   VD25OH 36 08/21/2013   VD25OH 24 (L) 05/30/2012    No results found for: "PREALBUMIN"    Latest Ref Rng & Units 02/23/2024    5:19 AM 02/21/2024    6:24 AM 02/20/2024    5:45 AM  CBC EXTENDED  WBC 4.0 - 10.5 K/uL 10.7  11.4  8.1   RBC 4.22 - 5.81 MIL/uL 3.77  4.23  4.37   Hemoglobin 13.0 - 17.0 g/dL 82.9  56.2  13.0   HCT 39.0 - 52.0 % 31.1  34.7  35.3   Platelets 150 - 400 K/uL 370  403  400   NEUT# 1.7 - 7.7 K/uL   5.5   Lymph# 0.7 - 4.0 K/uL   1.4      There is no height or weight on file to calculate BMI.  Orders:  No orders of the defined types were placed in this encounter.  No orders of the defined types were placed in this encounter.    Procedures: No procedures performed  Clinical Data: No additional findings.  ROS:  All other systems negative, except as noted in the HPI. Review of Systems  Objective: Vital Signs: There were no vitals taken for this visit.  Specialty Comments:  No specialty comments available.  PMFS History: Patient Active Problem List   Diagnosis Date Noted   Infected abrasion of right leg 02/20/2024   Abscess of right lower leg 02/18/2024   Cellulitis of right lower leg 02/14/2024   AKI (acute kidney injury) (HCC) 02/14/2024   Type 2 diabetes mellitus with diabetic polyneuropathy, with long-term current use of insulin  (HCC) 06/13/2022   Type 2 diabetes mellitus with hyperglycemia, with long-term current use of insulin  (HCC) 06/13/2022   Insulin  dependent type 2 diabetes mellitus (HCC) 02/11/2020   Glucosuria 02/11/2020   Non-restorative sleep 02/11/2020   Excessive daytime sleepiness 02/11/2020   Sleep related headaches 02/11/2020   Nocturia more than twice per night 02/11/2020   UARS (upper airway resistance syndrome) 04/07/2015   Severe obesity (BMI >= 40) (HCC) 04/07/2015   Primary snoring 04/07/2015   Snoring 12/21/2014   Drowsiness 12/21/2014   Witnessed apneic spells 12/21/2014   ED (erectile dysfunction)  09/24/2012   Type 2 diabetes mellitus with obesity (HCC) 07/04/2009    Class: Chronic   OBESITY, NOS 01/17/2007   BELLS PALSY 01/17/2007   HYPERTENSION, BENIGN SYSTEMIC 01/17/2007   Past Medical History:  Diagnosis Date   Cataract 2011   surgery on R eye   Diabetes mellitus without complication (HCC)    High cholesterol    Obesity     Family History  Problem Relation Age of Onset   Diabetes Mother    Cancer Father        leukemia    Obesity Other     Past Surgical History:  Procedure Laterality Date   EYE SURGERY Left 2011   cataract   EYE SURGERY Right 2012   INCISION AND DRAINAGE OF DEEP ABSCESS, CALF Right 02/20/2024   Procedure: INCISION AND DRAINAGE OF DEEP ABSCESS, CALF;  Surgeon: Timothy Ford, MD;  Location: [facility] OR;  Service: Orthopedics;  Laterality: Right;  RIGHT LEG DEBRIDEMENT   INCISION AND DRAINAGE OF WOUND Right 02/22/2024   Procedure: IRRIGATION AND DEBRIDEMENT WOUND;  Surgeon: Timothy Ford, MD;  Location: [facility] OR;  Service: Orthopedics;  Laterality: Right;  IRRIGATION AND DEBRIDEMENT RIGHT LEG   Social History   Occupational History    Employer: [employer]  Tobacco Use   Smoking status: Never    Passive exposure: Never   Smokeless tobacco: Never  Vaping Use   Vaping status: Never Used  Substance and Sexual Activity   Alcohol use: Yes    Comment: 1 or 2 every 3 months    Drug use: No   Sexual activity: Not on file

## 2024-03-25 ENCOUNTER — Telehealth: Payer: Self-pay | Admitting: Family

## 2024-03-25 ENCOUNTER — Other Ambulatory Visit: Payer: Self-pay | Admitting: Family

## 2024-03-25 ENCOUNTER — Other Ambulatory Visit: Payer: Self-pay | Admitting: *Deleted

## 2024-03-25 DIAGNOSIS — E669 Obesity, unspecified: Secondary | ICD-10-CM

## 2024-03-25 DIAGNOSIS — Z794 Long term (current) use of insulin: Secondary | ICD-10-CM

## 2024-03-25 MED ORDER — FREESTYLE LIBRE 2 SENSOR MISC: 11 refills | Status: DC

## 2024-03-25 NOTE — Telephone Encounter (Signed)
 Complete

## 2024-03-25 NOTE — Telephone Encounter (Signed)
 Rx request:   Message from outreach nurse: Neale Bale, I see your recent documentation regarding Free Style Sensors. Patient is actually needing sensors for the [facility] 3. He received the new Freestyle Libre while inpatient. Can you have the provider send a new prescription?   Is there any way PCP can send this Rx to pharmacy for the patient ?

## 2024-03-25 NOTE — Patient Instructions (Signed)
 Visit Information  Thank you for taking time to visit with me today. Please don't hesitate to contact me if I can be of assistance to you before our next scheduled telephone appointment.   Following is a copy of your care plan:   Goals Addressed             This Visit's Progress    COMPLETED: VBCI Transitions of Care (TOC) Care Plan       Problems:  Recent Hospitalization for treatment of DMII and RLE Cellulitis with Wound Vac- now removed   Diet/Nutrition/Food Resources A1C 14 % and Limited social support:    Goal:  Over the next 30 days, the patient will not experience hospital readmission  Interventions:  Transitions of Care:  Goal Met. Doctor Visits  - discussed the importance of doctor visits Contacted provider for patient needs message to A. Rogerio Clay, NP requesting refill for [employer] 3 sensors - another message sent 03/25/24 requesting sensors for FreeStyle 3, Freestyle 2 were ordered Troubleshoot use of DME in the home  Post discharge activity limitations prescribed by provider reviewed Post-op wound/incision care reviewed with patient/caregiver Reviewed Signs and symptoms of infection Referral to Longitudinal Case Management  Diabetes Interventions:  (Status:  Goal Met.) Short Term Goal  Assessed patient's understanding of A1c goal: <8% Reviewed medications with patient and discussed importance of medication adherence Discussed plans with patient for ongoing care management follow up and provided patient with direct contact information for care management team Reviewed scheduled/upcoming provider appointments including: 5/15 with Dr. Julio Ohm, 04/08/24 with Cardiology, PCP on 04/23/24 and Endocrinology on 05/05/24 Review of patient status, including review of consultants reports, relevant laboratory and other test results, and medications completed Screening for signs and symptoms of depression related to chronic disease state  Advised patient to engage in light exercise  30 minutes a day for at least 5 days a week-revisited Advised patient to keep a log of BS readings and foods eaten to correlate readings to food selections-emphasized the importance of taking log to provider visits Advised patient to include lean protein with meals and snacks and portion control-revisited Lab Results  Component Value Date   HGBA1C 14.1 (A) 01/22/2024    Wound Care (Status: Goal Met.) Short Term Goal  Evaluation of current treatment plan related to RLE cellulitis and wound vac placement     reviewed medications with patient and addressed questions reviewed scheduled provider appointments with patient-next visit with Dr. Julio Ohm on 03/20/24 Discussed dressing changes, ensuring patient performing daily   Patient Self Care Activities:  Attend all scheduled provider appointments Call pharmacy for medication refills 3-7 days in advance of running out of medications Call provider office for new concerns or questions  Participate in Transition of Care Program/Attend TOC scheduled calls Perform all self care activities independently  Perform IADL's (shopping, preparing meals, housekeeping, managing finances) independently Take medications as prescribed   Work with the pharmacist to address medication management needs and will continue to work with the clinical team to address health care and disease management related needs  Plan:  Patient has been referred to Longitudinal Case Management and will be scheduled with appropriate RNCM        Patient verbalizes understanding of instructions and care plan provided today and agrees to view in MyChart. Active MyChart status and patient understanding of how to access instructions and care plan via MyChart confirmed with patient.     Patient referred to Longitudinal CCM and will be scheduled with appropriate RNCM  Please call the care guide team at [phone] if you need to cancel or reschedule your appointment.   Please call  1-800-273-TALK (toll free, 24 hour hotline) go to [facility] Urgent [facility] [address] [place] [phone]) call 911 if you are experiencing a Mental Health or Behavioral Health Crisis or need someone to talk to.  Arna Better RN, BSN [place]  [facility] Health RN Care Manager [phone]

## 2024-03-25 NOTE — Transitions of Care (Post Inpatient/ED Visit) (Signed)
 Transition of Care  Week 5  Visit Note  03/25/2024  Name: Ian Hughes MRN: [id number]          DOB: February 28, 1981  Situation: Patient enrolled in [facility] 30-day program. Visit completed with Ian Hughes by telephone.   Background:   Initial Transition Care Management Follow-up Telephone Call    Past Medical History:  Diagnosis Date   Cataract 2011   surgery on R eye   Diabetes mellitus without complication (HCC)    High cholesterol    Obesity     Assessment: Patient Reported Symptoms: Cognitive Cognitive Status: Alert and oriented to person, place, and time, Normal speech and language skills, Insightful and able to interpret abstract concepts, Able to follow simple commands   Health Maintenance Behaviors: Annual physical exam, Exercise, Sleep adequate Healing Pattern: Average  Neurological Neurological Review of Symptoms: No symptoms reported    HEENT HEENT Symptoms Reported: No symptoms reported      Cardiovascular Cardiovascular Symptoms Reported: Not assessed Does patient have uncontrolled Hypertension?: Yes Is patient checking Blood Pressure at home?: No Cardiovascular Conditions: Hypertension Cardiovascular Management Strategies: Medication therapy, Diet modification, Routine screening, Exercise Cardiovascular Self-Management Outcome: 4 (good) Cardiovascular Comment: Walks dog 3 times a day  Respiratory Respiratory Symptoms Reported: No symptoms reported    Endocrine Patient reports the following symptoms related to hypoglycemia or hyperglycemia : No symptoms reported Is patient diabetic?: Yes Is patient checking blood sugars at home?: Yes Endocrine Conditions: Diabetes Endocrine Management Strategies: Diet modification, Medication therapy, Activity, Routine screening, Medical device, Exercise Endocrine Self-Management Outcome: 3 (uncertain) Endocrine Comment: Request FreeStyle Libre 3 sensors, sensors for 2 were ordered  Gastrointestinal Gastrointestinal Symptoms  Reported: No symptoms reported      Genitourinary Genitourinary Symptoms Reported: No symptoms reported    Integumentary Integumentary Symptoms Reported: Wound Additional Integumentary Details: continues to use Medihoney and packing with gauze, wearing compression socks as advised by Orthopedic Skin Conditions: Wound Skin Management Strategies: Routine screening, Medication therapy, Dressing changes Skin Self-Management Outcome: 4 (good) Skin Comment: The wound is improving  Musculoskeletal Musculoskelatal Symptoms Reviewed: No symptoms reported   Falls in the past year?: No Patient at Risk for Falls Due to: No Fall Risks  Psychosocial Psychosocial Symptoms Reported: No symptoms reported     Quality of Family Relationships: helpful, involved, supportive Do you feel physically threatened by others?: No   There were no vitals filed for this visit.  Medications Reviewed Today     Reviewed by Aura Leeds, RN (Registered Nurse) on 03/25/24 at 1249  Med List Status: <None>   Medication Order Taking? Sig Documenting Provider Last Dose Status Informant  acetaminophen  (TYLENOL ) 500 MG tablet [id number] Yes Take 500 mg by mouth every 6 (six) hours as needed. [provider] Taking Active Self  amLODipine  (NORVASC ) 5 MG tablet [id number] Yes Take 5 mg by mouth daily. [provider] Taking Active Self  atorvastatin  (LIPITOR) 10 MG tablet [id number] Yes Take 1 tablet (10 mg total) by mouth daily. Senaida Dama, NP Taking Active Self   Patient not taking:   Discontinued 10/06/19 1134 Continuous Glucose Receiver (FREESTYLE LIBRE 2 READER) DEVI [id number] No Use to check blood sugar continuously throughout the day. E11.42  Patient not taking: Reported on 03/11/2024   Newlin, Enobong, MD Not Taking Active Self  Continuous Glucose Sensor (FREESTYLE LIBRE 2 SENSOR) MISC [id number] No Use to check blood sugar continuously throughout the day. Change sensors once every 14 days. E11.42   Patient not taking: Reported  on 03/11/2024   Newlin, Enobong, MD Not Taking Active Self           Med Note Andee Kato, KIMBERLY L   Thu Feb 14, 2024 10:14 AM) Doesn't have on right now  doxycycline  (VIBRA -TABS) 100 MG tablet [id number] Yes Take 1 tablet (100 mg total) by mouth 2 (two) times daily. Timothy Ford, MD Taking Active   gabapentin  (NEURONTIN ) 300 MG capsule [id number] Yes Take 1 capsule (300 mg total) by mouth at bedtime. Senaida Dama, NP Taking Active Self   Patient not taking:   Discontinued 10/06/19 1134 hydrochlorothiazide  (HYDRODIURIL ) 12.5 MG tablet [id number] Yes Take 1 tablet (12.5 mg total) by mouth daily. Senaida Dama, NP Taking Active Self  Insulin  Glargine (BASAGLAR  KWIKPEN) 100 UNIT/ML [id number] Yes Inject 13 Units into the skin at bedtime. Senaida Dama, NP Taking Active Self  Insulin  Pen Needle 32G X 4 MM MISC [id number] Yes 1 each by Other route daily. Use to inject insulin  once daily. Senaida Dama, NP Taking Active Self  isosorbide mononitrate (IMDUR) 60 MG 24 hr tablet [id number] No Take 60 mg by mouth daily.  Patient not taking: Reported on 03/11/2024   [provider] Not Taking Active   losartan  (COZAAR ) 50 MG tablet [id number] Yes Take 1 tablet (50 mg total) by mouth daily. Senaida Dama, NP Taking Active Self           Med Note (Billee Balcerzak A   Tue Mar 11, 2024 10:49 AM)    metFORMIN  (GLUCOPHAGE -XR) 500 MG 24 hr tablet [id number] Yes Take 2 tablets (1,000 mg total) by mouth 2 (two) times daily with a meal. Senaida Dama, NP Taking Active Self  oxyCODONE -acetaminophen  (PERCOCET/ROXICET) 5-325 MG tablet [id number] No Take 1 tablet by mouth every 4 (four) hours as needed.  Patient not taking: Reported on 03/11/2024   Timothy Ford, MD Not Taking Active   TRUEplus Lancets 28G MISC [id number] Yes Use to check blood sugar 2 times a day Senaida Dama, NP Taking Active Self            Recommendation:   Longitudinal CCM  Follow Up Plan:    Referral to RN Case Manager  Arna Better RN, BSN [place]  [facility] Health RN Care Manager [phone]

## 2024-03-26 ENCOUNTER — Other Ambulatory Visit: Payer: Self-pay | Admitting: Family

## 2024-03-26 ENCOUNTER — Encounter: Payer: Self-pay | Admitting: Internal Medicine

## 2024-03-26 ENCOUNTER — Ambulatory Visit (INDEPENDENT_AMBULATORY_CARE_PROVIDER_SITE_OTHER): Admitting: Internal Medicine

## 2024-03-26 VITALS — BP 136/84 | HR 82 | Ht 70.0 in | Wt 241.0 lb

## 2024-03-26 DIAGNOSIS — Z794 Long term (current) use of insulin: Secondary | ICD-10-CM | POA: Diagnosis not present

## 2024-03-26 DIAGNOSIS — E1142 Type 2 diabetes mellitus with diabetic polyneuropathy: Secondary | ICD-10-CM

## 2024-03-26 DIAGNOSIS — E1165 Type 2 diabetes mellitus with hyperglycemia: Secondary | ICD-10-CM

## 2024-03-26 LAB — POCT GLUCOSE (DEVICE FOR HOME USE): Glucose Fasting, POC: 347 mg/dL — AB (ref 70–99)

## 2024-03-26 LAB — POCT GLYCOSYLATED HEMOGLOBIN (HGB A1C): Hemoglobin A1C: 11.7 % — AB (ref 4.0–5.6)

## 2024-03-26 MED ORDER — INSULIN PEN NEEDLE 32G X 4 MM MISC: 1.0000 | Freq: Every day | 1 refills | Status: DC

## 2024-03-26 MED ORDER — GLIMEPIRIDE 4 MG PO TABS: 4.0000 mg | ORAL_TABLET | Freq: Every day | ORAL | 3 refills | Status: DC

## 2024-03-26 MED ORDER — BASAGLAR KWIKPEN 100 UNIT/ML ~~LOC~~ SOPN
20.0000 [IU] | PEN_INJECTOR | Freq: Every day | SUBCUTANEOUS | 3 refills | Status: DC
Start: 2024-03-26 — End: 2024-06-02

## 2024-03-26 MED ORDER — METFORMIN HCL ER 500 MG PO TB24: 1000.0000 mg | ORAL_TABLET | Freq: Two times a day (BID) | ORAL | 1 refills | Status: DC

## 2024-03-26 MED ORDER — FREESTYLE LIBRE 3 PLUS SENSOR MISC
11 refills | Status: DC
Start: 2024-03-26 — End: 2024-07-02

## 2024-03-26 NOTE — Telephone Encounter (Signed)
 Called patient and he is aware, he stated that he needs th RX to be changed to the [place] 3 and not 2

## 2024-03-26 NOTE — Progress Notes (Signed)
 Name: Ian Hughes  Age/ Sex: 43 y.o., male   MRN/ DOB: [id number], 02-18-1981     PCP: Senaida Dama, NP   Reason for Endocrinology Evaluation: Type 2 Diabetes Mellitus  Initial Endocrine Consultative Visit: 07/10/2017    PATIENT IDENTIFIER: Ian Hughes is a 43 y.o. male with a past medical history of T2DM, dyslipidemia, HTN. The patient has followed with Endocrinology clinic since 07/10/2017 for consultative assistance with management of his diabetes.  DIABETIC HISTORY:  Ian Hughes was diagnosed with DM in 2009, he was started on insulin  in 2019 , Metformin  caused diarrhea . His hemoglobin A1c has ranged from 7.1% in 2016, peaking at 15.5% in 2021.  He last saw Ian Hughes in September 2021 SUBJECTIVE:   During the last visit (06/13/2022): A1c 14.5%  Today (03/26/2024): Ian Hughes  is here for a diabetes management. He checks his blood sugars 3 times daily.  He did not bring a meter.  Patient has not been to our clinic in 22 months.   Patient required I&D of a right leg abscess 02/2024 He is accompanied by his mother today, patient endorses that the patient after taking metformin  he runs all night to the bathroom Upon further questioning of the patient, he endorsed loose stools a couple times a week that was not life interfering  Denies nausea, vomiting    HOME DIABETES REGIMEN:  Metformin  500 mg 2 tabs BID Glimepiride  2 mg daily  at bedtime  Januvia  100 mg daily- not taking  Basaglar   13 units daily     Statin: Yes ACE-I/ARB: Yes    METER DOWNLOAD SUMMARY: n/a    DIABETIC COMPLICATIONS: Microvascular complications:   Denies: CKD, neuropathy  Last Eye Exam: Completed years ago   Macrovascular complications:   Denies: CAD, CVA, PVD   HISTORY:  Past Medical History:  Past Medical History:  Diagnosis Date   Cataract 2011   surgery on R eye   Diabetes mellitus without complication (HCC)    High cholesterol    Obesity    Past Surgical History:   Past Surgical History:  Procedure Laterality Date   EYE SURGERY Left 2011   cataract   EYE SURGERY Right 2012   INCISION AND DRAINAGE OF DEEP ABSCESS, CALF Right 02/20/2024   Procedure: INCISION AND DRAINAGE OF DEEP ABSCESS, CALF;  Surgeon: Timothy Ford, MD;  Location: [facility] OR;  Service: Orthopedics;  Laterality: Right;  RIGHT LEG DEBRIDEMENT   INCISION AND DRAINAGE OF WOUND Right 02/22/2024   Procedure: IRRIGATION AND DEBRIDEMENT WOUND;  Surgeon: Timothy Ford, MD;  Location: [facility] OR;  Service: Orthopedics;  Laterality: Right;  IRRIGATION AND DEBRIDEMENT RIGHT LEG   Social History:  reports that he has never smoked. He has never been exposed to tobacco smoke. He has never used smokeless tobacco. He reports current alcohol use. He reports that he does not use drugs. Family History:  Family History  Problem Relation Age of Onset   Diabetes Mother    Cancer Father        leukemia    Obesity Other      HOME MEDICATIONS: Allergies as of 03/26/2024   No Known Allergies      Medication List        Accurate as of Mar 26, 2024  9:03 AM. If you have any questions, ask your nurse or doctor.          acetaminophen  500 MG tablet Commonly known as: TYLENOL  Take 500 mg by  mouth every 6 (six) hours as needed.   amLODipine  5 MG tablet Commonly known as: NORVASC  Take 5 mg by mouth daily.   atorvastatin  10 MG tablet Commonly known as: LIPITOR Take 1 tablet (10 mg total) by mouth daily.   Basaglar  KwikPen 100 UNIT/ML Inject 13 Units into the skin at bedtime.   doxycycline  100 MG tablet Commonly known as: VIBRA -TABS Take 1 tablet (100 mg total) by mouth 2 (two) times daily.   DULoxetine  20 MG capsule Commonly known as: CYMBALTA  1 capsule.   FreeStyle Libre 2 [place] Use to check blood sugar continuously throughout the day. E11.42   FreeStyle Libre 2 Sensor Misc Use to check blood sugar continuously throughout the day. Change sensors once every 14 days. E11.42   gabapentin   300 MG capsule Commonly known as: NEURONTIN  Take 1 capsule (300 mg total) by mouth at bedtime.   glimepiride  2 MG tablet Commonly known as: AMARYL  Take 2 mg by mouth daily with breakfast.   hydrochlorothiazide  12.5 MG tablet Commonly known as: HYDRODIURIL  Take 1 tablet (12.5 mg total) by mouth daily.   Insulin  Pen Needle 32G X 4 MM Misc 1 each by Other route daily. Use to inject insulin  once daily.   isosorbide mononitrate 60 MG 24 hr tablet Commonly known as: IMDUR Take 60 mg by mouth daily.   losartan  50 MG tablet Commonly known as: COZAAR  Take 1 tablet (50 mg total) by mouth daily.   metFORMIN  500 MG 24 hr tablet Commonly known as: GLUCOPHAGE -XR Take 2 tablets (1,000 mg total) by mouth 2 (two) times daily with a meal.   oxyCODONE -acetaminophen  5-325 MG tablet Commonly known as: PERCOCET/ROXICET Take 1 tablet by mouth every 4 (four) hours as needed.   TRUEplus Lancets 28G Misc Use to check blood sugar 2 times a day         OBJECTIVE:   Vital Signs: BP 136/84 (BP Location: Right Arm, Patient Position: Sitting, Cuff Size: Normal)   Pulse 82   Ht 5\' 10"  (1.778 m)   Wt 241 lb (109.3 kg)   SpO2 99%   BMI 34.58 kg/m   Wt Readings from Last 3 Encounters:  03/26/24 241 lb (109.3 kg)  03/18/24 242 lb (109.8 kg)  03/04/24 242 lb (109.8 kg)     Exam: General: Pt appears well and is in NAD  Neck: General: Supple without adenopathy. Thyroid: Thyroid size normal.  No goiter or nodules appreciated.   Lungs: Clear with good BS bilat with no rales, rhonchi, or wheezes  Heart: RRR   Abdomen: Normoactive bowel sounds, soft, nontender, without masses or organomegaly palpable  Extremities: No pretibial edema.   Neuro: MS is good with appropriate affect, pt is alert and Ox3    DM foot exam: 06/13/2022  The skin of the feet is intact without sores or ulcerations. The pedal pulses are 2+ on right and 2+ on left. The sensation is absent to a screening 5.07, 10 gram  monofilament bilaterally    DATA REVIEWED:  Lab Results  Component Value Date   HGBA1C 11.7 (A) 03/26/2024   HGBA1C 14.1 (A) 01/22/2024   HGBA1C 12.8 (H) 01/08/2024    Latest Reference Range & Units 02/23/24 05:19  Sodium 135 - 145 mmol/L 133 (L)  Potassium 3.5 - 5.1 mmol/L 4.0  Chloride 98 - 111 mmol/L 98  CO2 22 - 32 mmol/L 24  Glucose 70 - 99 mg/dL 161 (H)  BUN 6 - 20 mg/dL 13  Creatinine 0.96 - 0.45 mg/dL  0.83  Calcium  8.9 - 10.3 mg/dL 8.4 (L)  Anion gap 5 - 15  11  GFR, Estimated >60 mL/min >60   In office BG 347 Mg/DL  ASSESSMENT / PLAN / RECOMMENDATIONS:   1) Type 2 Diabetes Mellitus, poorly controlled, With Neuropathic complications - Most recent A1c of 11.7 %. Goal A1c <7.0%.     - Pt has not been to our clinic in almost 2 years, which is not unusual for him -He continues with hyperglycemia -He used to be on Januvia , but this has been off his list for unknown reasons -Patient advised to bring glucose meter in the future - I will increase his glimepiride , he has been taking it at bedtime, I have advised him to take it before first meal of the day -I will also increase his insulin  as below    MEDICATIONS: -Increase Basaglar  20 units daily -Increase glimepiride  4 mg daily -Continue metformin  500 mg, 2 tablets with breakfast and 2 tablets with supper    EDUCATION / INSTRUCTIONS: BG monitoring instructions: Patient is instructed to check his blood sugars 3 times a day, before each meal. Call [facility] if: BG persistently < 70  I reviewed the Rule of 15 for the treatment of hypoglycemia in detail with the patient. Literature supplied.   2) Diabetic complications:  Eye: Does not have known diabetic retinopathy.  Neuro/ Feet: Does  have known diabetic peripheral neuropathy .  Renal: Patient does not have known baseline CKD. He   is  on an ACEI/ARB at present.      F/U in 3  months     Signed electronically by: Natale Bail, MD  [facility] Endocrinology  [facility] [address] [place] [place], [place] [address] Phone: [phone] FAX: [phone]   CC: Senaida Dama, NP [address] [place] [address] Phone: [phone]  Fax: [phone]  Return to Endocrinology clinic as below: Future Appointments  Date Time Provider Department Center  04/03/2024  9:15 AM Timothy Ford, MD [facility] None  04/08/2024  1:40 PM [place], Peter M, MD [facility]  04/23/2024  1:20 PM Senaida Dama, NP [facility] None

## 2024-03-26 NOTE — Telephone Encounter (Signed)
 Complete

## 2024-03-26 NOTE — Patient Instructions (Addendum)
 Continue metformin  500 mg, 2 tablets with breakfast 2 tablets with dinner Take glimepiride  4 mg, 1 tablet before breakfast Increase Basaglar   20 units daily    HOW TO TREAT LOW BLOOD SUGARS (Blood sugar LESS THAN 70 MG/DL) Please follow the RULE OF 15 for the treatment of hypoglycemia treatment (when your (blood sugars are less than 70 mg/dL)   STEP 1: Take 15 grams of carbohydrates when your blood sugar is low, which includes:  3-4 GLUCOSE TABS  OR 3-4 OZ OF JUICE OR REGULAR SODA OR ONE TUBE OF GLUCOSE GEL    STEP 2: RECHECK blood sugar in 15 MINUTES STEP 3: If your blood sugar is still low at the 15 minute recheck --> then, go back to STEP 1 and treat AGAIN with another 15 grams of carbohydrates.

## 2024-03-27 NOTE — Telephone Encounter (Signed)
 Called patient and left voicemail.

## 2024-04-03 ENCOUNTER — Ambulatory Visit (INDEPENDENT_AMBULATORY_CARE_PROVIDER_SITE_OTHER): Admitting: Orthopedic Surgery

## 2024-04-03 DIAGNOSIS — L97911 Non-pressure chronic ulcer of unspecified part of right lower leg limited to breakdown of skin: Secondary | ICD-10-CM

## 2024-04-04 ENCOUNTER — Encounter: Payer: Self-pay | Admitting: Orthopedic Surgery

## 2024-04-04 NOTE — Progress Notes (Signed)
 Office Visit Note   Patient: Ian Hughes           Date of Birth: 04/05/1981           MRN: [id number] Visit Date: 04/03/2024              Requested by: Senaida Dama, NP [address] [place],  [place] [address] PCP: Senaida Dama, NP  Chief Complaint  Patient presents with   Right Leg - Routine Post Op    4/2 and 02/22/24 I&D right leg      HPI: Patient is a 43 year old gentleman who is 6 weeks status post irrigation debridement right leg wound.  Patient is currently in a Vive sock.  Assessment & Plan: Visit Diagnoses:  1. Ulcer of right lower extremity, limited to breakdown of skin (HCC)     Plan: Recommended protein supplements elevation exercise and continue compression.  Follow-Up Instructions: Return in about 4 weeks (around 05/01/2024).   Ortho Exam  Patient is alert, oriented, no adenopathy, well-dressed, normal affect, normal respiratory effort. Examination the anterior ulcer is healed.  The lateral ulcer measures 6.5 x 3.5 cm with 100% healthy granulation tissue.  Imaging: No results found.   Labs: Lab Results  Component Value Date   HGBA1C 11.7 (A) 03/26/2024   HGBA1C 14.1 (A) 01/22/2024   HGBA1C 12.8 (H) 01/08/2024   REPTSTATUS 02/25/2024 FINAL 02/20/2024   GRAMSTAIN  02/20/2024    FEW WBC PRESENT, PREDOMINANTLY PMN NO ORGANISMS SEEN    CULT  02/20/2024    FEW STAPHYLOCOCCUS AUREUS NO ANAEROBES ISOLATED Performed at [facility] Lab, [address], [place], [place] [address]    [facility] STAPHYLOCOCCUS AUREUS 02/20/2024     Lab Results  Component Value Date   ALBUMIN 3.3 (L) 02/14/2024   ALBUMIN 4.3 06/21/2020   ALBUMIN 4.3 12/12/2019    Lab Results  Component Value Date   MG 2.0 02/16/2024   Lab Results  Component Value Date   VD25OH 29 (L) 09/08/2014   VD25OH 36 08/21/2013   VD25OH 24 (L) 05/30/2012    No results found for: "PREALBUMIN"    Latest Ref Rng & Units 02/23/2024    5:19 AM 02/21/2024    6:24 AM  02/20/2024    5:45 AM  CBC EXTENDED  WBC 4.0 - 10.5 K/uL 10.7  11.4  8.1   RBC 4.22 - 5.81 MIL/uL 3.77  4.23  4.37   Hemoglobin 13.0 - 17.0 g/dL 69.6  29.5  28.4   HCT 39.0 - 52.0 % 31.1  34.7  35.3   Platelets 150 - 400 K/uL 370  403  400   NEUT# 1.7 - 7.7 K/uL   5.5   Lymph# 0.7 - 4.0 K/uL   1.4      There is no height or weight on file to calculate BMI.  Orders:  No orders of the defined types were placed in this encounter.  No orders of the defined types were placed in this encounter.    Procedures: No procedures performed  Clinical Data: No additional findings.  ROS:  All other systems negative, except as noted in the HPI. Review of Systems  Objective: Vital Signs: There were no vitals taken for this visit.  Specialty Comments:  No specialty comments available.  PMFS History: Patient Active Problem List   Diagnosis Date Noted   Infected abrasion of right leg 02/20/2024   Abscess of right lower leg 02/18/2024   Cellulitis of right  lower leg 02/14/2024   AKI (acute kidney injury) (HCC) 02/14/2024   Type 2 diabetes mellitus with diabetic polyneuropathy, with long-term current use of insulin  (HCC) 06/13/2022   Type 2 diabetes mellitus with hyperglycemia, with long-term current use of insulin  (HCC) 06/13/2022   Insulin  dependent type 2 diabetes mellitus (HCC) 02/11/2020   Glucosuria 02/11/2020   Non-restorative sleep 02/11/2020   Excessive daytime sleepiness 02/11/2020   Sleep related headaches 02/11/2020   Nocturia more than twice per night 02/11/2020   UARS (upper airway resistance syndrome) 04/07/2015   Severe obesity (BMI >= 40) (HCC) 04/07/2015   Primary snoring 04/07/2015   Snoring 12/21/2014   Drowsiness 12/21/2014   Witnessed apneic spells 12/21/2014   ED (erectile dysfunction) 09/24/2012   Type 2 diabetes mellitus with obesity (HCC) 07/04/2009    Class: Chronic   OBESITY, NOS 01/17/2007   BELLS PALSY 01/17/2007   HYPERTENSION, BENIGN SYSTEMIC  01/17/2007   Past Medical History:  Diagnosis Date   Cataract 2011   surgery on R eye   Diabetes mellitus without complication (HCC)    High cholesterol    Obesity     Family History  Problem Relation Age of Onset   Diabetes Mother    Cancer Father        leukemia    Obesity Other     Past Surgical History:  Procedure Laterality Date   EYE SURGERY Left 2011   cataract   EYE SURGERY Right 2012   INCISION AND DRAINAGE OF DEEP ABSCESS, CALF Right 02/20/2024   Procedure: INCISION AND DRAINAGE OF DEEP ABSCESS, CALF;  Surgeon: Timothy Ford, MD;  Location: [facility] OR;  Service: Orthopedics;  Laterality: Right;  RIGHT LEG DEBRIDEMENT   INCISION AND DRAINAGE OF WOUND Right 02/22/2024   Procedure: IRRIGATION AND DEBRIDEMENT WOUND;  Surgeon: Timothy Ford, MD;  Location: [facility] OR;  Service: Orthopedics;  Laterality: Right;  IRRIGATION AND DEBRIDEMENT RIGHT LEG   Social History   Occupational History    Employer:   Tobacco Use   Smoking status: Never    Passive exposure: Never   Smokeless tobacco: Never  Vaping Use   Vaping status: Never Used  Substance and Sexual Activity   Alcohol use: Yes    Comment: 1 or 2 every 3 months    Drug use: No   Sexual activity: Not on file

## 2024-04-04 NOTE — Progress Notes (Signed)
 Cardiology Office Note:    Date:  04/08/2024   ID:  SHAHRUKH PASCH, DOB [birth year], MRN [id number]  PCP:  Senaida Dama, NP   [facility] Providers Cardiologist:  None     Referring MD: Senaida Dama, NP   Chief Complaint  Patient presents with   Hypertension    History of Present Illness:    Ian Hughes is a 43 y.o. male seen at the request of Dr Rogerio Clay for evaluation of HTN. He also has history of DM, HLD and obesity. He was admitted earlier this year with RLE cellulitis requiring surgery. Followed by Dr Julio Ohm. On last visit with PCP BP was elevated 145/87. Since then readings have improved. He is walking. Trying to eat healthy. Restricting salt. Tolerating medication well.   Past Medical History:  Diagnosis Date   Cataract 2011   surgery on R eye   Diabetes mellitus without complication (HCC)    High cholesterol    Obesity     Past Surgical History:  Procedure Laterality Date   EYE SURGERY Left 2011   cataract   EYE SURGERY Right 2012   INCISION AND DRAINAGE OF DEEP ABSCESS, CALF Right 02/20/2024   Procedure: INCISION AND DRAINAGE OF DEEP ABSCESS, CALF;  Surgeon: Timothy Ford, MD;  Location: [facility] OR;  Service: Orthopedics;  Laterality: Right;  RIGHT LEG DEBRIDEMENT   INCISION AND DRAINAGE OF WOUND Right 02/22/2024   Procedure: IRRIGATION AND DEBRIDEMENT WOUND;  Surgeon: Timothy Ford, MD;  Location: [facility] OR;  Service: Orthopedics;  Laterality: Right;  IRRIGATION AND DEBRIDEMENT RIGHT LEG    Current Medications: Current Meds  Medication Sig   acetaminophen  (TYLENOL ) 500 MG tablet Take 500 mg by mouth every 6 (six) hours as needed.   amLODipine  (NORVASC ) 5 MG tablet Take 5 mg by mouth daily.   atorvastatin  (LIPITOR) 10 MG tablet Take 1 tablet (10 mg total) by mouth daily.   Continuous Glucose Sensor (FREESTYLE LIBRE 3 PLUS SENSOR) MISC Use to check blood sugar continuously throughout the day. Change sensors once every 14 days. E11.42   DULoxetine   (CYMBALTA ) 20 MG capsule 1 capsule.   gabapentin  (NEURONTIN ) 300 MG capsule Take 1 capsule (300 mg total) by mouth at bedtime.   glimepiride  (AMARYL ) 4 MG tablet Take 1 tablet (4 mg total) by mouth daily before breakfast.   hydrochlorothiazide  (HYDRODIURIL ) 12.5 MG tablet Take 1 tablet (12.5 mg total) by mouth daily.   Insulin  Glargine (BASAGLAR  KWIKPEN) 100 UNIT/ML Inject 20 Units into the skin at bedtime.   Insulin  Pen Needle 32G X 4 MM MISC 1 each by Other route daily. Use to inject insulin  once daily.   isosorbide mononitrate (IMDUR) 60 MG 24 hr tablet Take 60 mg by mouth daily.   losartan  (COZAAR ) 50 MG tablet Take 1 tablet (50 mg total) by mouth daily.   metFORMIN  (GLUCOPHAGE -XR) 500 MG 24 hr tablet Take 2 tablets (1,000 mg total) by mouth 2 (two) times daily with a meal.   TRUEplus Lancets 28G MISC Use to check blood sugar 2 times a day     Allergies:   Patient has no known allergies.   Social History   Socioeconomic History   Marital status: Divorced    Spouse name: Not on file   Number of children: 1   Years of education: 12+   Highest education level: 12th grade  Occupational History    Employer: [employer]  Tobacco Use   Smoking status: Never  Passive exposure: Never   Smokeless tobacco: Never  Vaping Use   Vaping status: Never Used  Substance and Sexual Activity   Alcohol use: Yes    Comment: 1 or 2 every 3 months    Drug use: No   Sexual activity: Not on file  Other Topics Concern   Not on file  Social History Narrative   Lives at home with wife and stepson.   Caffeine use: none   Has one child.   Social Drivers of Corporate investment banker Strain: Low Risk  (03/12/2023)   Overall Financial Resource Strain (CARDIA)    Difficulty of Paying Living Expenses: Not very hard  Food Insecurity: No Food Insecurity (02/26/2024)   Hunger Vital Sign    Worried About Running Out of Food in the Last Year: Never true    Ran Out of Food in the Last Year: Never true   Transportation Needs: No Transportation Needs (02/26/2024)   PRAPARE - Administrator, Civil Service (Medical): No    Lack of Transportation (Non-Medical): No  Physical Activity: Sufficiently Active (03/12/2023)   Exercise Vital Sign    Days of Exercise per Week: 7 days    Minutes of Exercise per Session: 30 min  Stress: No Stress Concern Present (03/12/2023)   [employer] of Occupational Health - Occupational Stress Questionnaire    Feeling of Stress : Only a little  Social Connections: Patient Declined (02/17/2024)   Social Connection and Isolation Panel [NHANES]    Frequency of Communication with Friends and Family: Patient declined    Frequency of Social Gatherings with Friends and Family: Patient declined    Attends Religious Services: Patient declined    Database administrator or Organizations: Patient declined    Attends Engineer, structural: Patient declined    Marital Status: Patient declined     Family History: The patient's family history includes Cancer in his father; Diabetes in his mother; Obesity in an other family member.  ROS:   Please see the history of present illness.     All other systems reviewed and are negative.  EKGs/Labs/Other Studies Reviewed:    The following studies were reviewed today: EKG Interpretation Date/Time:  Tuesday Apr 08 2024 13:58:54 EDT Ventricular Rate:  89 PR Interval:  172 QRS Duration:  90 QT Interval:  336 QTC Calculation: 408 R Axis:   47  Text Interpretation: Sinus rhythm with Premature ventricular complexes ECG OTHERWISE WITHIN NORMAL LIMITS No previous ECGs available Confirmed by [place], Stefanie Hodgens [phone]) on 04/08/2024 2:02:37 PM   EKG Interpretation Date/Time:  Tuesday Apr 08 2024 13:58:54 EDT Ventricular Rate:  89 PR Interval:  172 QRS Duration:  90 QT Interval:  336 QTC Calculation: 408 R Axis:   47  Text Interpretation: Sinus rhythm with Premature ventricular complexes ECG OTHERWISE WITHIN  NORMAL LIMITS No previous ECGs available Confirmed by [place], Meir Elwood [phone]) on 04/08/2024 2:02:37 PM    Recent Labs: 02/14/2024: ALT 19 02/16/2024: Magnesium 2.0 02/23/2024: BUN 13; Creatinine, Ser 0.83; Hemoglobin 10.1; Platelets 370; Potassium 4.0; Sodium 133  Recent Lipid Panel    Component Value Date/Time   CHOL 108 09/19/2023 1213   TRIG 86 09/19/2023 1213   HDL 41 09/19/2023 1213   CHOLHDL 2.6 09/19/2023 1213   CHOLHDL 3.3 06/22/2016 0958   VLDL 11 06/22/2016 0958   LDLCALC 50 09/19/2023 1213   LDLDIRECT 105 (H) 08/21/2013 1606     Risk Assessment/Calculations:  Physical Exam:    VS:  BP 116/82   Pulse 89   Ht 5\' 10"  (1.778 m)   Wt 241 lb (109.3 kg)   SpO2 98%   BMI 34.58 kg/m     Wt Readings from Last 3 Encounters:  04/08/24 241 lb (109.3 kg)  03/26/24 241 lb (109.3 kg)  03/18/24 242 lb (109.8 kg)     GEN:  Well nourished, well developed in no acute distress HEENT: Normal NECK: No JVD; No carotid bruits LYMPHATICS: No lymphadenopathy CARDIAC: RRR, no murmurs, rubs, gallops RESPIRATORY:  Clear to auscultation without rales, wheezing or rhonchi  ABDOMEN: Soft, non-tender, non-distended MUSCULOSKELETAL:  No edema; No deformity. Wound on right leg dressed SKIN: Warm and dry NEUROLOGIC:  Alert and oriented x 3 PSYCHIATRIC:  Normal affect   ASSESSMENT:    1. HYPERTENSION, BENIGN SYSTEMIC    PLAN:    In order of problems listed above:  HTN BP is good today. Repeat on my exam 110/62. Recommend continuing current therapy including amlodipine , HCT and losartan . No further evaluation needed. Can follow up PRN           Medication Adjustments/Labs and Tests Ordered: Current medicines are reviewed at length with the patient today.  Concerns regarding medicines are outlined above.  Orders Placed This Encounter  Procedures   EKG 12-Lead   No orders of the defined types were placed in this encounter.   There are no Patient Instructions on  file for this visit.   Signed, Cristan Scherzer [place], MD  04/08/2024 2:20 PM    [facility]

## 2024-04-08 ENCOUNTER — Ambulatory Visit: Attending: Cardiology | Admitting: Cardiology

## 2024-04-08 ENCOUNTER — Encounter: Payer: Self-pay | Admitting: Cardiology

## 2024-04-08 VITALS — BP 116/82 | HR 89 | Ht 70.0 in | Wt 241.0 lb

## 2024-04-08 DIAGNOSIS — I10 Essential (primary) hypertension: Secondary | ICD-10-CM | POA: Diagnosis not present

## 2024-04-08 NOTE — Patient Instructions (Signed)
 Medication Instructions:  Continue same   Lab Work: None ordered  Testing/Procedures: None ordered  Follow-Up: At [facility], you and your health needs are our priority.  As part of our continuing mission to provide you with exceptional heart care, our providers are all part of one team.  This team includes your primary Cardiologist (physician) and Advanced Practice Providers or APPs (Physician Assistants and Nurse Practitioners) who all work together to provide you with the care you need, when you need it.  Your next appointment:  As Needed    Provider:  Dr.Jordan   We recommend signing up for the patient portal called "MyChart".  Sign up information is provided on this After Visit Summary.  MyChart is used to connect with patients for Virtual Visits (Telemedicine).  Patients are able to view lab/test results, encounter notes, upcoming appointments, etc.  Non-urgent messages can be sent to your provider as well.   To learn more about what you can do with MyChart, go to [email or web address].

## 2024-04-16 ENCOUNTER — Other Ambulatory Visit: Payer: Self-pay

## 2024-04-16 NOTE — Patient Instructions (Signed)
 Visit Information  Thank you for taking time to visit with me today. Please don't hesitate to contact me if I can be of assistance to you before our next scheduled appointment.  Our next appointment is by telephone on 04/30/24 at 9 AM Please call the care guide team at [phone] if you need to cancel or reschedule your appointment.   Following is a copy of your care plan:   Goals Addressed             This Visit's Progress    VBCI RN Care Plan related to DMII   On track    Problems:  Chronic Disease Management support and education needs related to DMII  Goal: Over the next 30 days the Patient will attend all scheduled medical appointments: PCP 04/23/24 as evidenced by completed visit notes uploaded to EMR        demonstrate Improved adherence to prescribed treatment plan for DMII as evidenced by A1c decreasing from 11.7 towards goal of <7% take all medications exactly as prescribed and will call provider for medication related questions as evidenced by medication adherence    Pick up FreeStyle Libre sensors from pharmacy and apply for closer blood sugar monitoring/management  Interventions:   Diabetes Interventions: Assessed patient's understanding of A1c goal: <7% Reviewed medications with patient and discussed importance of medication adherence Discussed plans with patient for ongoing care management follow up and provided patient with direct contact information for care management team Provided patient with written educational materials related to hypo and hyperglycemia and importance of correct treatment Reviewed scheduled/upcoming provider appointments including: PCP 04/23/24, endo 07/02/24 Advised patient, providing education and rationale, to check cbg three times a day and record, calling PCP or endo for findings outside established parameters Called pharmacy to request multiple sensors be provided to patient during pickup to decrease number of trips to pharmacy and increase  compliance Lab Results  Component Value Date   HGBA1C 11.7 (A) 03/26/2024    Patient Self-Care Activities:  Attend all scheduled provider appointments Call provider office for new concerns or questions  Take medications as prescribed   check blood sugar at prescribed times: three times daily check feet daily for cuts, sores or redness take the blood sugar log to all doctor visits limit fast food meals to no more than 1 per week Increase exercise as tolerated  Plan:  Telephone follow up appointment with care management team member scheduled for:  04/30/24 at 9 AM          VBCI RN Care Plan related to RLE wound   On track    Problems:  Chronic Disease Management support and education needs related to RLE wound  Goal: Over the next 30 days the Patient will attend all scheduled medical appointments: ortho 05/08/24 as evidenced by completed visit notes uploaded to EMR        demonstrate Ongoing adherence to prescribed treatment plan for RLE wound as evidenced by ortho report of wound continuing to heal as expected Report no signs of infection  Interventions:   Evaluation of current treatment plan related to RLE wound,  self-management and patient's adherence to plan as established by provider. Discussed plans with patient for ongoing care management follow up and provided patient with direct contact information for care management team Evaluation of current treatment plan related to RLE wound and patient's adherence to plan as established by provider Provided education to patient re: signs and symptoms of infection Reviewed medications with patient and discussed importance  of blood sugar control as it relates to wound healing  Patient Self-Care Activities:  Attend all scheduled provider appointments Call provider office for new concerns or questions  Perform all self care activities independently  Take medications as prescribed    Plan:  Telephone follow up appointment with  care management team member scheduled for:  04/30/24 at 9 AM             Please call the Suicide and Crisis Lifeline: 988 call 1-800-273-TALK (toll free, 24 hour hotline) if you are experiencing a Mental Health or Behavioral Health Crisis or need someone to talk to.  Patient verbalizes understanding of instructions and care plan provided today and agrees to view in MyChart. Active MyChart status and patient understanding of how to access instructions and care plan via MyChart confirmed with patient.     Theodora Fish, RN MSN [place]  VBCI Population Health RN Care Manager Direct Dial: [phone]  Fax: [phone]

## 2024-04-16 NOTE — Patient Outreach (Signed)
 Complex Care Management   Visit Note  04/16/2024  Name:  Ian Hughes MRN: [id number] DOB: 1981/10/21  Situation: Referral received for Complex Care Management related to Diabetes with Complications and RLE wound I obtained verbal consent from Patient.  Visit completed with [name]  on the phone  Background:   Past Medical History:  Diagnosis Date   Cataract 2011   surgery on R eye   Diabetes mellitus without complication (HCC)    High cholesterol    Obesity     Assessment: Patient Reported Symptoms:  Cognitive Cognitive Status: Able to follow simple commands, Alert and oriented to person, place, and time, Normal speech and language skills Cognitive/Intellectual Conditions Management [RPT]: None reported or documented in medical history or problem list      Neurological Neurological Review of Symptoms: No symptoms reported    HEENT HEENT Symptoms Reported: No symptoms reported      Cardiovascular Cardiovascular Symptoms Reported: No symptoms reported Does patient have uncontrolled Hypertension?: No Cardiovascular Conditions: Hypertension Cardiovascular Management Strategies: Medication therapy Cardiovascular Comment: Patient does have BP cuff but does not check regularly at home.  Respiratory Respiratory Symptoms Reported: No symptoms reported    Endocrine Patient reports the following symptoms related to hypoglycemia or hyperglycemia : No symptoms reported Is patient diabetic?: Yes Is patient checking blood sugars at home?: Yes Endocrine Conditions: Diabetes Endocrine Management Strategies: Medical device, Medication therapy Endocrine Comment: Patient has FreeStyle Libre and reports sensors are ready to be picked up by pharmacy. Patient notes trouble getting sensors in the past and asks if pharmacy can provide more than one sensor at a time. Call placed to [facility] where order was sent. They will combine refills so that patient recieves multiple sensors at a time.   Gastrointestinal Gastrointestinal Symptoms Reported: No symptoms reported (Last BM today) Gastrointestinal Comment: Patient reports that his appetite has been "pretty good." Nutrition Risk Screen (CP): No indicators present  Genitourinary Genitourinary Symptoms Reported: No symptoms reported    Integumentary Integumentary Symptoms Reported: Wound Additional Integumentary Details: Recent hospitalization for RLE cellulitis after scraping his leg on a pallet at work. He was discharged with a wound vac which has since been removed. Patient currently has guaze and compression sock over wound as advised by ortho. Drainage is yellow-ish without odor. Next appointment with ortho 05/08/24. Skin Conditions: Wound Skin Management Strategies: Routine screening, Dressing changes  Musculoskeletal Musculoskelatal Symptoms Reviewed: No symptoms reported   Falls in the past year?: No Number of falls in past year: 1 or less Was there an injury with Fall?: No Fall Risk Category Calculator: 0 Patient Fall Risk Level: Low Fall Risk Patient at Risk for Falls Due to: No Fall Risks Fall risk Follow up: Education provided, Falls evaluation completed  Psychosocial Psychosocial Symptoms Reported: No symptoms reported     Quality of Family Relationships: helpful, involved, supportive Do you feel physically threatened by others?: No      03/25/2024    1:01 PM  Depression screen PHQ 2/9  Decreased Interest 0  Down, Depressed, Hopeless 0  PHQ - 2 Score 0    There were no vitals filed for this visit.  Medications Reviewed Today     Reviewed by Valaria Garland, RN (Registered Nurse) on 04/16/24 at [phone]  Med List Status: <None>   Medication Order Taking? Sig Documenting Provider Last Dose Status Informant  acetaminophen  (TYLENOL ) 500 MG tablet [id number]  Take 500 mg by mouth every 6 (six) hours as needed. [provider]  Active Self  amLODipine  (NORVASC ) 5 MG tablet [id number]  Take 5 mg by mouth  daily. [provider]  Active Self  atorvastatin  (LIPITOR) 10 MG tablet [id number]  Take 1 tablet (10 mg total) by mouth daily. Senaida Dama, NP  Active Self   Patient not taking:   Discontinued 10/06/19 1134 Continuous Glucose Sensor (FREESTYLE LIBRE 3 PLUS SENSOR) MISC [id number]  Use to check blood sugar continuously throughout the day. Change sensors once every 14 days. E11.42 Senaida Dama, NP  Active   DULoxetine  (CYMBALTA ) 20 MG capsule [id number]  1 capsule. [provider]  Active   gabapentin  (NEURONTIN ) 300 MG capsule [id number]  Take 1 capsule (300 mg total) by mouth at bedtime. Senaida Dama, NP  Active Self  glimepiride  (AMARYL ) 4 MG tablet [id number]  Take 1 tablet (4 mg total) by mouth daily before breakfast. Shamleffer, Ibtehal Jaralla, MD  Active   hydrochlorothiazide  (HYDRODIURIL ) 12.5 MG tablet [id number]  Take 1 tablet (12.5 mg total) by mouth daily. Senaida Dama, NP  Active Self  Insulin  Glargine (BASAGLAR  KWIKPEN) 100 UNIT/ML [id number]  Inject 20 Units into the skin at bedtime. Shamleffer, Julian Obey, MD  Active   Insulin  Pen Needle 32G X 4 MM MISC [id number]  1 each by Other route daily. Use to inject insulin  once daily. Shamleffer, Julian Obey, MD  Active   isosorbide mononitrate (IMDUR) 60 MG 24 hr tablet [id number]  Take 60 mg by mouth daily. [provider]  Active   losartan  (COZAAR ) 50 MG tablet [id number]  Take 1 tablet (50 mg total) by mouth daily. Senaida Dama, NP  Active Self           Med Note (ROBB, MELANIE A   Tue Mar 11, 2024 10:49 AM)    metFORMIN  (GLUCOPHAGE -XR) 500 MG 24 hr tablet [id number]  Take 2 tablets (1,000 mg total) by mouth 2 (two) times daily with a meal. Shamleffer, Julian Obey, MD  Active   TRUEplus Lancets 28G MISC [id number]  Use to check blood sugar 2 times a day Senaida Dama, NP  Active Self            Recommendation:   PCP Follow-up Specialty provider follow-up ortho 05/08/24, endo  07/02/24 Continue Current Plan of Care  Follow Up Plan:   Telephone follow up appointment date/time:  04/30/24 at 9 AM  Theodora Fish, RN MSN [place]  [facility] RN Care Manager Direct Dial: [phone]  Fax: [phone]

## 2024-04-23 ENCOUNTER — Ambulatory Visit (INDEPENDENT_AMBULATORY_CARE_PROVIDER_SITE_OTHER): Payer: Self-pay | Admitting: Family

## 2024-04-23 ENCOUNTER — Encounter: Payer: Self-pay | Admitting: Family

## 2024-04-23 VITALS — BP 144/89 | HR 84 | Temp 98.4°F | Resp 16 | Ht 70.0 in | Wt 248.4 lb

## 2024-04-23 DIAGNOSIS — Z794 Long term (current) use of insulin: Secondary | ICD-10-CM | POA: Diagnosis not present

## 2024-04-23 DIAGNOSIS — I10 Essential (primary) hypertension: Secondary | ICD-10-CM | POA: Diagnosis not present

## 2024-04-23 DIAGNOSIS — E1165 Type 2 diabetes mellitus with hyperglycemia: Secondary | ICD-10-CM

## 2024-04-23 DIAGNOSIS — L03115 Cellulitis of right lower limb: Secondary | ICD-10-CM

## 2024-04-23 DIAGNOSIS — Z7984 Long term (current) use of oral hypoglycemic drugs: Secondary | ICD-10-CM | POA: Diagnosis not present

## 2024-04-23 DIAGNOSIS — L089 Local infection of the skin and subcutaneous tissue, unspecified: Secondary | ICD-10-CM

## 2024-04-23 DIAGNOSIS — S80811S Abrasion, right lower leg, sequela: Secondary | ICD-10-CM

## 2024-04-23 DIAGNOSIS — L02415 Cutaneous abscess of right lower limb: Secondary | ICD-10-CM

## 2024-04-23 NOTE — Progress Notes (Signed)
 Patient ID: Ian Hughes, male    DOB: 17-Oct-1981  MRN: [id number]  CC: Chronic Conditions Follow-Up  Subjective: Ian Hughes is a 43 y.o. male who presents for chronic conditions follow-up. He is accompanied by his mother.  His concerns today include:  - Established with Cardiology for hypertension/chronic conditions management. He does not complain of red flag symptoms such as but not limited to chest pain, shortness of breath, worst headache of life, nausea/vomiting.  - Established with Endocrinology for diabetes/chronic conditions management. States he was also recently seen by a clinical pharmacist and is a participant in the [facility]. He denies red flag symptoms associated with diabetes. He would like referral to a nutritionist. - Established with Orthopedics. States Orthopedics recently removed drain and no present issues/concerns. Today right lower extremity wrapped in ace bandage clean, dry, and intact. - States he does not need medication refills today.   Patient Active Problem List   Diagnosis Date Noted   Infected abrasion of right leg 02/20/2024   Abscess of right lower leg 02/18/2024   Cellulitis of right lower leg 02/14/2024   AKI (acute kidney injury) (HCC) 02/14/2024   Type 2 diabetes mellitus with diabetic polyneuropathy, with long-term current use of insulin  (HCC) 06/13/2022   Type 2 diabetes mellitus with hyperglycemia, with long-term current use of insulin  (HCC) 06/13/2022   Insulin  dependent type 2 diabetes mellitus (HCC) 02/11/2020   Glucosuria 02/11/2020   Non-restorative sleep 02/11/2020   Excessive daytime sleepiness 02/11/2020   Sleep related headaches 02/11/2020   Nocturia more than twice per night 02/11/2020   UARS (upper airway resistance syndrome) 04/07/2015   Severe obesity (BMI >= 40) (HCC) 04/07/2015   Primary snoring 04/07/2015   Snoring 12/21/2014   Drowsiness 12/21/2014   Witnessed apneic spells 12/21/2014   ED (erectile dysfunction)  09/24/2012   Type 2 diabetes mellitus with obesity (HCC) 07/04/2009    Class: Chronic   OBESITY, NOS 01/17/2007   BELLS PALSY 01/17/2007   HYPERTENSION, BENIGN SYSTEMIC 01/17/2007     Current Outpatient Medications on File Prior to Visit  Medication Sig Dispense Refill   acetaminophen  (TYLENOL ) 500 MG tablet Take 500 mg by mouth every 6 (six) hours as needed.     amLODipine  (NORVASC ) 5 MG tablet Take 5 mg by mouth daily.     atorvastatin  (LIPITOR) 10 MG tablet Take 1 tablet (10 mg total) by mouth daily. 90 tablet 0   Continuous Glucose Sensor (FREESTYLE LIBRE 3 PLUS SENSOR) MISC Use to check blood sugar continuously throughout the day. Change sensors once every 14 days. E11.42 1 each 11   gabapentin  (NEURONTIN ) 300 MG capsule Take 1 capsule (300 mg total) by mouth at bedtime. 90 capsule 0   glimepiride  (AMARYL ) 4 MG tablet Take 1 tablet (4 mg total) by mouth daily before breakfast. 90 tablet 3   Insulin  Glargine (BASAGLAR  KWIKPEN) 100 UNIT/ML Inject 20 Units into the skin at bedtime. 15 mL 3   Insulin  Pen Needle 32G X 4 MM MISC 1 each by Other route daily. Use to inject insulin  once daily. 100 each 1   metFORMIN  (GLUCOPHAGE -XR) 500 MG 24 hr tablet Take 2 tablets (1,000 mg total) by mouth 2 (two) times daily with a meal. 360 tablet 1   TRUEplus Lancets 28G MISC Use to check blood sugar 2 times a day 100 each 4   DULoxetine  (CYMBALTA ) 20 MG capsule 1 capsule. (Patient not taking: Reported on 04/16/2024)     hydrochlorothiazide  (HYDRODIURIL ) 12.5 MG  tablet Take 1 tablet (12.5 mg total) by mouth daily. 90 tablet 0   isosorbide mononitrate (IMDUR) 60 MG 24 hr tablet Take 60 mg by mouth daily. (Patient not taking: Reported on 04/16/2024)     losartan  (COZAAR ) 50 MG tablet Take 1 tablet (50 mg total) by mouth daily. 90 tablet 0   [DISCONTINUED] bromocriptine  (PARLODEL ) 2.5 MG tablet 1/4 tab daily (Patient not taking: Reported on 03/12/2019) 25 tablet 3   No current facility-administered medications  on file prior to visit.    No Known Allergies  Social History   Socioeconomic History   Marital status: Divorced    Spouse name: Not on file   Number of children: 1   Years of education: 12+   Highest education level: 12th grade  Occupational History    Employer: [employer]  Tobacco Use   Smoking status: Never    Passive exposure: Never   Smokeless tobacco: Never  Vaping Use   Vaping status: Never Used  Substance and Sexual Activity   Alcohol use: Yes    Comment: 1 or 2 every 3 months    Drug use: No   Sexual activity: Not on file  Other Topics Concern   Not on file  Social History Narrative   Lives at home with wife and stepson.   Caffeine use: none   Has one child.   Social Drivers of Corporate investment banker Strain: Low Risk  (03/12/2023)   Overall Financial Resource Strain (CARDIA)    Difficulty of Paying Living Expenses: Not very hard  Food Insecurity: No Food Insecurity (04/16/2024)   Hunger Vital Sign    Worried About Running Out of Food in the Last Year: Never true    Ran Out of Food in the Last Year: Never true  Transportation Needs: No Transportation Needs (04/16/2024)   PRAPARE - Administrator, Civil Service (Medical): No    Lack of Transportation (Non-Medical): No  Physical Activity: Sufficiently Active (03/12/2023)   Exercise Vital Sign    Days of Exercise per Week: 7 days    Minutes of Exercise per Session: 30 min  Stress: No Stress Concern Present (03/12/2023)   [employer] of Occupational Health - Occupational Stress Questionnaire    Feeling of Stress : Only a little  Social Connections: Patient Declined (02/17/2024)   Social Connection and Isolation Panel [NHANES]    Frequency of Communication with Friends and Family: Patient declined    Frequency of Social Gatherings with Friends and Family: Patient declined    Attends Religious Services: Patient declined    Database administrator or Organizations: Patient declined    Attends  Banker Meetings: Patient declined    Marital Status: Patient declined  Intimate Partner Violence: Not At Risk (04/16/2024)   Humiliation, Afraid, Rape, and Kick questionnaire    Fear of Current or Ex-Partner: No    Emotionally Abused: No    Physically Abused: No    Sexually Abused: No    Family History  Problem Relation Age of Onset   Diabetes Mother    Cancer Father        leukemia    Obesity Other     Past Surgical History:  Procedure Laterality Date   EYE SURGERY Left 2011   cataract   EYE SURGERY Right 2012   INCISION AND DRAINAGE OF DEEP ABSCESS, CALF Right 02/20/2024   Procedure: INCISION AND DRAINAGE OF DEEP ABSCESS, CALF;  Surgeon: Timothy Ford, MD;  Location: [facility] OR;  Service: Orthopedics;  Laterality: Right;  RIGHT LEG DEBRIDEMENT   INCISION AND DRAINAGE OF WOUND Right 02/22/2024   Procedure: IRRIGATION AND DEBRIDEMENT WOUND;  Surgeon: Timothy Ford, MD;  Location[facility];  Service: Orthopedics;  Laterality: Right;  IRRIGATION AND DEBRIDEMENT RIGHT LEG    ROS: Review of Systems Negative except as stated above  PHYSICAL EXAM: BP (!) 144/89   Pulse 84   Temp 98.4 F (36.9 C) (Oral)   Resp 16   Ht 5\' 10"  (1.778 m)   Wt 248 lb 6.4 oz (112.7 kg)   SpO2 98%   BMI 35.64 kg/m   Physical Exam HENT:     Head: Normocephalic and atraumatic.     Nose: Nose normal.     Mouth/Throat:     Mouth: Mucous membranes are moist.     Pharynx: Oropharynx is clear.  Eyes:     Extraocular Movements: Extraocular movements intact.     Conjunctiva/sclera: Conjunctivae normal.     Pupils: Pupils are equal, round, and reactive to light.  Cardiovascular:     Rate and Rhythm: Normal rate and regular rhythm.     Pulses: Normal pulses.     Heart sounds: Normal heart sounds.  Pulmonary:     Effort: Pulmonary effort is normal.     Breath sounds: Normal breath sounds.  Musculoskeletal:        General: Normal range of motion.     Cervical back: Normal range of motion and  neck supple.  Neurological:     General: No focal deficit present.     Mental Status: He is alert and oriented to person, place, and time.  Psychiatric:        Mood and Affect: Mood normal.        Behavior: Behavior normal.     ASSESSMENT AND PLAN: 1. Primary hypertension (Primary) - Blood pressure not at goal during today's visit, mildly elevated. Patient asymptomatic without chest pressure, chest pain, palpitations, shortness of breath, worst headache of life, and any additional red flag symptoms. - Continue present management.  - Counseled on blood pressure goal of less than 130/80, low-sodium, DASH diet, medication compliance, and 150 minutes of moderate intensity exercise per week as tolerated. Counseled on medication adherence and adverse effects. - Keep all scheduled appointments with established Cardiology.  2. Type 2 diabetes mellitus with hyperglycemia, with long-term current use of insulin  (HCC) - Continue present management.  - Referral to Medical Nutrition Therapy for evaluation/management. - Keep all scheduled appointments with established Endocrinology.  - Amb ref to Medical Nutrition Therapy-MNT  3. Cellulitis of right lower leg 4. Abscess of right lower leg 5. Infected abrasion of right lower extremity, sequela - Keep all scheduled appointments with established Orthopedics.   Patient was given the opportunity to ask questions.  Patient verbalized understanding of the plan and was able to repeat key elements of the plan. Patient was given clear instructions to go to Emergency Department or return to medical center if symptoms don't improve, worsen, or new problems develop.The patient verbalized understanding.   Orders Placed This Encounter  Procedures   Amb ref to Medical Nutrition Therapy-MNT    Follow-up with primary provider as scheduled.  Senaida Dama, NP

## 2024-04-23 NOTE — Progress Notes (Signed)
 3 month follow up, no concerns

## 2024-04-25 ENCOUNTER — Other Ambulatory Visit: Payer: Self-pay

## 2024-04-25 ENCOUNTER — Ambulatory Visit: Attending: Family Medicine | Admitting: Pharmacist

## 2024-04-25 DIAGNOSIS — Z794 Long term (current) use of insulin: Secondary | ICD-10-CM

## 2024-04-25 DIAGNOSIS — Z7984 Long term (current) use of oral hypoglycemic drugs: Secondary | ICD-10-CM | POA: Diagnosis not present

## 2024-04-25 DIAGNOSIS — E1165 Type 2 diabetes mellitus with hyperglycemia: Secondary | ICD-10-CM | POA: Diagnosis not present

## 2024-04-25 MED ORDER — OZEMPIC (0.25 OR 0.5 MG/DOSE) 2 MG/3ML ~~LOC~~ SOPN
0.2500 mg | PEN_INJECTOR | SUBCUTANEOUS | 1 refills | Status: DC
  Filled 2024-04-25 (×3): qty 3, 28d supply, fill #0
  Filled 2024-06-12: qty 3, 28d supply, fill #1

## 2024-04-25 NOTE — Progress Notes (Signed)
 S:     No chief complaint on file.  43 y.o. male who presents for diabetes evaluation, education, and management.  PMH is significant for HTN, T2DM complicated by non-adherence, diabetic neuropathy, obesity.   Patient was referred and last seen by Primary Care Provider, Lavona Pounds, on 04/23/2024.   At last visit with Ms. Rogerio Clay, no changes were made to his DM regimen. Of note, he is also managed by Endocrine (Dr. Rosalea Collin).  She last saw him 03/26/2024. A1c showed some improvement (11.7, down from 14.1 in March) but was still above goal. It was noted that he had not seen Endo in ~22 months prior to that appt.   Regarding his DM, he has intolerances to metformin . With Dr. Rosalea Collin last month, pt endorsed diarrhea that occurs occasionally. Reported adherence to glimepiride  and Basaglar  but was not taking Januvia . He was previously on Ozempic  but stopped this in the past due to cost.   Today, patient arrives in good spirits and presents with his mother. He is motivated to take control of his DM. Brings his CGM in for review today.  Family/Social History:  Fhx: DM, obesity Tobacco: never smoker Alcohol: none reported   Current diabetes medications include: Basaglar  20u daily, glimepiride  4 mg daily, metformin  1000 mg XR BID Current hypertension medications include: amlodipine  5 mg daily, hydrochlorothiazide  12.5 mg, losartan  50 mg daily  Current hyperlipidemia medications include: atorvastatin  10 mg daily  Patient reports adherence to taking all medications as prescribed.  Insurance coverage: [employer]  Patient denies hypoglycemic events.  Patient reports nocturia (nighttime urination).  Patient reports neuropathy (nerve pain). Patient reports visual changes. Patient denies self foot exams.   Patient reported dietary habits:  -3 meals  -BF: limited by time.  -Lunch: varies - leftovers   -Dinner: usually a protein and two sides  -Starches: admits to regular intake  of rice, beans, potatoes  -Snacks: denies any intake of sweets  -Dinners: Gatorade (regular), tea (sweet tea with sugar)  Patient-reported exercise habits:  -Walks dog 45 minutes daily    O:  Date of Download: 04/25/2024 for a 28-day report % Time CGM is active: 50% Average Glucose: 192 mg/dL Glucose Management Indicator: 7.9  Glucose Variability: 25.3 (goal <36%) Time in Goal:  - Time in range 70-180: 39% - Time above range: 61% - Time below range: 0%  Lab Results  Component Value Date   HGBA1C 11.7 (A) 03/26/2024   There were no vitals filed for this visit.  Lipid Panel     Component Value Date/Time   CHOL 108 09/19/2023 1213   TRIG 86 09/19/2023 1213   HDL 41 09/19/2023 1213   CHOLHDL 2.6 09/19/2023 1213   CHOLHDL 3.3 06/22/2016 0958   VLDL 11 06/22/2016 0958   LDLCALC 50 09/19/2023 1213   LDLDIRECT 105 (H) 08/21/2013 1606    Clinical Atherosclerotic Cardiovascular Disease (ASCVD): No  The ASCVD Risk score (Arnett DK, et al., 2019) failed to calculate for the following reasons:   The valid total cholesterol range is 130 to 320 mg/dL   Patient is participating in a Managed Medicaid Plan: no   A/P: Diabetes longstanding currently uncontrolled. He is not symptomatic at this time. Patient is able to verbalize appropriate hypoglycemia management plan. Medication adherence appears to be optimal. He is amenable to restarting this today and as long as it is not cost-prohibitive.  -Continued metformin  1000 mg XR BID.  -Continue Basaglar  20u daily.  -Start Ozempic  0.25 mg weekly. Will plan  on this dose for 4 weeks with the option to titrate to 0.5 mg weekly in 1 month. -Continue glimepiride  4 mg daily.  -Libre supplies sent.  -Patient educated on purpose, proper use, and potential adverse effects of Basaglar , Ozempic .  -Extensively discussed pathophysiology of diabetes, recommended lifestyle interventions, dietary effects on blood sugar control.  -Counseled on s/sx of and  management of hypoglycemia.  -Next A1c anticipated 06/2023.   Written patient instructions provided. Patient verbalized understanding of treatment plan.  Total time in face to face counseling 30 minutes.    Follow-up:  Pharmacist in 1 month.  Marene Shape, PharmD, Becky Bowels, CPP Clinical Pharmacist [facility] [phone]

## 2024-04-30 ENCOUNTER — Other Ambulatory Visit: Payer: Self-pay

## 2024-04-30 NOTE — Patient Outreach (Signed)
 Complex Care Management   Visit Note  04/30/2024  Name:  Ian Hughes MRN: [id number] DOB: 1981-03-11  Situation: Referral received for Complex Care Management related to Diabetes with Complications and RLE wound I obtained verbal consent from Patient.  Visit completed with [name]  on the phone  Background:   Past Medical History:  Diagnosis Date   Cataract 2011   surgery on R eye   Diabetes mellitus without complication (HCC)    High cholesterol    Obesity     Assessment: Patient Reported Symptoms:  Cognitive Cognitive Status: Able to follow simple commands, Alert and oriented to person, place, and time, Normal speech and language skills Cognitive/Intellectual Conditions Management [RPT]: None reported or documented in medical history or problem list      Neurological Neurological Review of Symptoms: Not assessed    HEENT HEENT Symptoms Reported: Not assessed      Cardiovascular Cardiovascular Symptoms Reported: Not assessed    Respiratory Respiratory Symptoms Reported: Not assesed    Endocrine Patient reports the following symptoms related to hypoglycemia or hyperglycemia : No symptoms reported Is patient diabetic?: Yes Is patient checking blood sugars at home?: Yes Endocrine Conditions: Diabetes Endocrine Management Strategies: Medical device, Medication therapy Endocrine Comment: Patient has been started on Ozempic  since previous visit. Confirmed that he is taking medication.  Gastrointestinal Gastrointestinal Symptoms Reported: No symptoms reported Gastrointestinal Comment: Patient has an appointment with nutritionist in 06/10/24. Nutrition Risk Screen (CP): No indicators present  Genitourinary Genitourinary Symptoms Reported: Not assessed    Integumentary Integumentary Symptoms Reported: Wound Additional Integumentary Details: Patient reports that wound is getting smaller, no issues noted. He continues to use guaze and compression sock over his wound. Next  appointment with ortho 05/08/24. Skin Conditions: Wound Skin Management Strategies: Routine screening, Dressing changes  Musculoskeletal Musculoskelatal Symptoms Reviewed: Not assessed        Psychosocial Psychosocial Symptoms Reported: Not assessed            04/23/2024    1:30 PM  Depression screen PHQ 2/9  Decreased Interest 0  Down, Depressed, Hopeless 0  PHQ - 2 Score 0  Altered sleeping 0  Tired, decreased energy 0  Change in appetite 0  Feeling bad or failure about yourself  0  Trouble concentrating 0  Moving slowly or fidgety/restless 0  Suicidal thoughts 0  PHQ-9 Score 0  Difficult doing work/chores Not difficult at all    There were no vitals filed for this visit.  Medications Reviewed Today     Reviewed by Valaria Garland, RN (Registered Nurse) on 04/30/24 at [phone]  Med List Status: <None>   Medication Order Taking? Sig Documenting Provider Last Dose Status Informant  acetaminophen  (TYLENOL ) 500 MG tablet [id number] No Take 500 mg by mouth every 6 (six) hours as needed. [provider] Taking Active Self  amLODipine  (NORVASC ) 5 MG tablet [id number] No Take 5 mg by mouth daily. [provider] Taking Active Self  atorvastatin  (LIPITOR) 10 MG tablet [id number] No Take 1 tablet (10 mg total) by mouth daily. Senaida Dama, NP Taking Active Self  Patient not taking:  Discontinued 10/06/19 1134 Continuous Glucose Sensor (FREESTYLE LIBRE 3 PLUS SENSOR) MISC [id number] No Use to check blood sugar continuously throughout the day. Change sensors once every 14 days. E11.42 Senaida Dama, NP Taking Active   DULoxetine  (CYMBALTA ) 20 MG capsule [id number] No 1 capsule.  Patient not taking: Reported on 04/16/2024   [provider] Not Taking Active  gabapentin  (NEURONTIN ) 300 MG capsule [id number] No Take 1 capsule (300 mg total) by mouth at bedtime. Senaida Dama, NP Taking Active Self  glimepiride  (AMARYL ) 4 MG tablet [id number] No Take 1 tablet  (4 mg total) by mouth daily before breakfast. Shamleffer, Ibtehal Jaralla, MD Taking Active   hydrochlorothiazide  (HYDRODIURIL ) 12.5 MG tablet [id number] No Take 1 tablet (12.5 mg total) by mouth daily. Senaida Dama, NP Taking Expired 04/21/24 2359 Self  Insulin  Glargine (BASAGLAR  KWIKPEN) 100 UNIT/ML [id number] No Inject 20 Units into the skin at bedtime. Shamleffer, Julian Obey, MD Taking Active   Insulin  Pen Needle 32G X 4 MM MISC [id number] No 1 each by Other route daily. Use to inject insulin  once daily. Shamleffer, Julian Obey, MD Taking Active   isosorbide mononitrate (IMDUR) 60 MG 24 hr tablet [id number] No Take 60 mg by mouth daily.  Patient not taking: Reported on 04/16/2024   [provider] Not Taking Active            Med Note (Adella Manolis P   Wed Apr 16, 2024  9:31 AM) Patient does not remember being prescribed medication. Not noted in cardiology note to be taking.  losartan  (COZAAR ) 50 MG tablet [id number] No Take 1 tablet (50 mg total) by mouth daily. Senaida Dama, NP Taking Expired 04/21/24 2359 Self           Med Note (ROBB, MELANIE A   Tue Mar 11, 2024 10:49 AM)    metFORMIN  (GLUCOPHAGE -XR) 500 MG 24 hr tablet [id number] No Take 2 tablets (1,000 mg total) by mouth 2 (two) times daily with a meal. Shamleffer, Julian Obey, MD Taking Active   Semaglutide ,0.25 or 0.5MG /DOS, (OZEMPIC , 0.25 OR 0.5 MG/DOSE,) 2 MG/3ML SOPN [id number]  Inject 0.25 mg into the skin once a week. Joaquin Mulberry, MD  Active   TRUEplus Lancets 28G MISC [id number] No Use to check blood sugar 2 times a day Senaida Dama, NP Taking Active Self            Recommendation:   Specialty provider follow-up Ortho 05/08/24 Continue Current Plan of Care  Follow Up Plan:   Telephone follow up appointment date/time:  05/14/24 at 11 AM  Theodora Fish, RN MSN [place]  [facility] RN Care Manager Direct Dial: [phone]  Fax: [phone]

## 2024-04-30 NOTE — Patient Instructions (Signed)
 Visit Information  Thank you for taking time to visit with me today. Please don't hesitate to contact me if I can be of assistance to you before our next scheduled appointment.  Your next care management appointment is by telephone on 05/14/24 at 11 AM   Please call the care guide team at [phone] if you need to cancel, schedule, or reschedule an appointment.   Please call the Suicide and Crisis Lifeline: 988 call 1-800-273-TALK (toll free, 24 hour hotline) if you are experiencing a Mental Health or Behavioral Health Crisis or need someone to talk to.  Theodora Fish, RN MSN [place]  [facility] Population [facility] RN Care Manager Direct Dial: [phone]  Fax: [phone]

## 2024-05-03 ENCOUNTER — Other Ambulatory Visit: Payer: Self-pay | Admitting: Family

## 2024-05-03 DIAGNOSIS — I10 Essential (primary) hypertension: Secondary | ICD-10-CM

## 2024-05-05 ENCOUNTER — Ambulatory Visit: Admitting: Internal Medicine

## 2024-05-05 NOTE — Telephone Encounter (Signed)
 Complete

## 2024-05-08 ENCOUNTER — Encounter: Admitting: Orthopedic Surgery

## 2024-05-08 ENCOUNTER — Ambulatory Visit: Admitting: Orthopedic Surgery

## 2024-05-08 ENCOUNTER — Other Ambulatory Visit (INDEPENDENT_AMBULATORY_CARE_PROVIDER_SITE_OTHER): Payer: Self-pay

## 2024-05-08 DIAGNOSIS — R29898 Other symptoms and signs involving the musculoskeletal system: Secondary | ICD-10-CM | POA: Diagnosis not present

## 2024-05-08 DIAGNOSIS — M542 Cervicalgia: Secondary | ICD-10-CM

## 2024-05-09 ENCOUNTER — Other Ambulatory Visit: Payer: Self-pay

## 2024-05-09 DIAGNOSIS — M542 Cervicalgia: Secondary | ICD-10-CM

## 2024-05-13 ENCOUNTER — Other Ambulatory Visit: Payer: Self-pay | Admitting: Family

## 2024-05-13 DIAGNOSIS — I10 Essential (primary) hypertension: Secondary | ICD-10-CM

## 2024-05-14 ENCOUNTER — Other Ambulatory Visit: Payer: Self-pay | Admitting: Family

## 2024-05-14 ENCOUNTER — Telehealth: Payer: Self-pay

## 2024-05-14 DIAGNOSIS — G629 Polyneuropathy, unspecified: Secondary | ICD-10-CM

## 2024-05-14 NOTE — Telephone Encounter (Signed)
 Complete

## 2024-05-15 ENCOUNTER — Telehealth: Payer: Self-pay | Admitting: Family

## 2024-05-15 ENCOUNTER — Encounter: Payer: Self-pay | Admitting: Orthopedic Surgery

## 2024-05-15 NOTE — Progress Notes (Signed)
 Office Visit Note   Patient: Ian Hughes           Date of Birth: November 29, 1980           MRN: [id number] Visit Date: 05/08/2024              Requested by: [name], NP [address] [place],  [place] [address] PCP: [name], NP  Chief Complaint  Patient presents with   Right Leg - Follow-up    4/2 and 02/22/24 I&D right leg      HPI: Patient is a 43 year old gentleman who presents for 2 separate issues.  #1 complains of weakness in the right hand.  #2 patient is status post irrigation debridement of the right leg April 2 and April 4.  Assessment & Plan: Visit Diagnoses:  1. Neck pain   2. Weakness of right hand     Plan: Patient was provided a bottle of Vashe to proceed with Vashe dressing changes daily.  Patient will continue with compression and follow-up in 4 weeks.  Will make appointment with neurology for further evaluation of the intrinsic weakness right hand.  Follow-Up Instructions: No follow-ups on file.   Ortho Exam  Patient is alert, oriented, no adenopathy, well-dressed, normal affect, normal respiratory effort. Examination of the right upper extremity patient has intrinsic weakness with thenar atrophy.  Patient states he has had a 61-month history of ulnar-sided finger numbness.  He denies any radicular symptoms.  No pain to palpation of the elbow.  Examination of the right leg the ulcer measures 5 cm x 1.5 cm with healthy granulation tissue.  Patient is showing excellent improvement in the wound bed.    Imaging: No results found.   Labs: Lab Results  Component Value Date   HGBA1C 11.7 (A) 03/26/2024   HGBA1C 14.1 (A) 01/22/2024   HGBA1C 12.8 (H) 01/08/2024   REPTSTATUS 02/25/2024 FINAL 02/20/2024   GRAMSTAIN  02/20/2024    FEW WBC PRESENT, PREDOMINANTLY PMN NO ORGANISMS SEEN    CULT  02/20/2024    FEW STAPHYLOCOCCUS AUREUS NO ANAEROBES ISOLATED Performed at [facility], [address] [place], [place] [address]     [facility] STAPHYLOCOCCUS AUREUS 02/20/2024     Lab Results  Component Value Date   ALBUMIN 3.3 (L) 02/14/2024   ALBUMIN 4.3 06/21/2020   ALBUMIN 4.3 12/12/2019    Lab Results  Component Value Date   MG 2.0 02/16/2024   Lab Results  Component Value Date   VD25OH 29 (L) 09/08/2014   VD25OH 36 08/21/2013   VD25OH 24 (L) 05/30/2012    No results found for: PREALBUMIN    Latest Ref Rng & Units 02/23/2024    5:19 AM 02/21/2024    6:24 AM 02/20/2024    5:45 AM  CBC EXTENDED  WBC 4.0 - 10.5 K/uL 10.7  11.4  8.1   RBC 4.22 - 5.81 MIL/uL 3.77  4.23  4.37   Hemoglobin 13.0 - 17.0 g/dL 89.8  88.3  88.0   HCT 39.0 - 52.0 % 31.1  34.7  35.3   Platelets 150 - 400 K/uL 370  403  400   NEUT# 1.7 - 7.7 K/uL   5.5   Lymph# 0.7 - 4.0 K/uL   1.4      There is no height or weight on file to calculate BMI.  Orders:  Orders Placed This Encounter  Procedures   XR Cervical Spine 2 or 3 views  Ambulatory referral to Physical Medicine Rehab   No orders of the defined types were placed in this encounter.    Procedures: No procedures performed  Clinical Data: No additional findings.  ROS:  All other systems negative, except as noted in the HPI. Review of Systems  Objective: Vital Signs: There were no vitals taken for this visit.  Specialty Comments:  No specialty comments available.  PMFS History: Patient Active Problem List   Diagnosis Date Noted   Infected abrasion of right leg 02/20/2024   Abscess of right lower leg 02/18/2024   Cellulitis of right lower leg 02/14/2024   AKI (acute kidney injury) (HCC) 02/14/2024   Type 2 diabetes mellitus with diabetic polyneuropathy, with long-term current use of insulin  (HCC) 06/13/2022   Type 2 diabetes mellitus with hyperglycemia, with long-term current use of insulin  (HCC) 06/13/2022   Insulin  dependent type 2 diabetes mellitus (HCC) 02/11/2020   Glucosuria 02/11/2020   Non-restorative sleep 02/11/2020   Excessive daytime  sleepiness 02/11/2020   Sleep related headaches 02/11/2020   Nocturia more than twice per night 02/11/2020   UARS (upper airway resistance syndrome) 04/07/2015   Severe obesity (BMI >= 40) (HCC) 04/07/2015   Primary snoring 04/07/2015   Snoring 12/21/2014   Drowsiness 12/21/2014   Witnessed apneic spells 12/21/2014   ED (erectile dysfunction) 09/24/2012   Type 2 diabetes mellitus with obesity (HCC) 07/04/2009    Class: Chronic   OBESITY, NOS 01/17/2007   BELLS PALSY 01/17/2007   HYPERTENSION, BENIGN SYSTEMIC 01/17/2007   Past Medical History:  Diagnosis Date   Cataract 2011   surgery on R eye   Diabetes mellitus without complication (HCC)    High cholesterol    Obesity     Family History  Problem Relation Age of Onset   Diabetes Mother    Cancer Father        leukemia    Obesity Other     Past Surgical History:  Procedure Laterality Date   EYE SURGERY Left 2011   cataract   EYE SURGERY Right 2012   INCISION AND DRAINAGE OF DEEP ABSCESS, CALF Right 02/20/2024   Procedure: INCISION AND DRAINAGE OF DEEP ABSCESS, CALF;  Surgeon: [name] Jerona GAILS, MD;  Location: [facility] OR;  Service: Orthopedics;  Laterality: Right;  RIGHT LEG DEBRIDEMENT   INCISION AND DRAINAGE OF WOUND Right 02/22/2024   Procedure: IRRIGATION AND DEBRIDEMENT WOUND;  Surgeon: [name] Jerona GAILS, MD;  Location: [facility];  Service: Orthopedics;  Laterality: Right;  IRRIGATION AND DEBRIDEMENT RIGHT LEG   Social History   Occupational History    Employer: [employer]  Tobacco Use   Smoking status: Never    Passive exposure: Never   Smokeless tobacco: Never  Vaping Use   Vaping status: Never Used  Substance and Sexual Activity   Alcohol use: Yes    Comment: 1 or 2 every 3 months    Drug use: No   Sexual activity: Not on file

## 2024-05-15 NOTE — Telephone Encounter (Signed)
 Patient was identified as falling into the [place] Measure - Diabetes.   Patient was: Appointment already scheduled for:  06/02/2024 with luke , last A1c was 03/26/2024.

## 2024-05-19 ENCOUNTER — Other Ambulatory Visit: Payer: Self-pay

## 2024-05-19 ENCOUNTER — Encounter: Payer: Self-pay | Admitting: Neurology

## 2024-05-19 DIAGNOSIS — R202 Paresthesia of skin: Secondary | ICD-10-CM

## 2024-05-19 NOTE — Patient Outreach (Signed)
 Complex Care Management   Visit Note  05/19/2024  Name:  Ian Hughes MRN: [id number] DOB: 27-Dec-1980  Situation: Referral received for Complex Care Management related to RLE wound I obtained verbal consent from Patient.  Visit completed with Ian Hughes  on the phone  Background:   Past Medical History:  Diagnosis Date   Cataract 2011   surgery on R eye   Diabetes mellitus without complication (HCC)    High cholesterol    Obesity     Assessment: Patient Reported Symptoms:  Cognitive Cognitive Status: No symptoms reported, Able to follow simple commands, Normal speech and language skills Cognitive/Intellectual Conditions Management [RPT]: None reported or documented in medical history or problem list      Neurological Neurological Review of Symptoms: Not assessed    HEENT HEENT Symptoms Reported: Not assessed      Cardiovascular Cardiovascular Symptoms Reported: Not assessed    Respiratory Respiratory Symptoms Reported: Not assesed    Endocrine Endocrine Symptoms Reported: No symptoms reported, Other Other symptoms related to hypoglycemia or hyperglycemia: Patient reports one episode of hypoglycemia when he first started Ozempic  about a month ago. His sugar dropped to 55 and he experienced blurry vision, sweating, and difficulty walking. He reports he keeps something on him at all times to bring his sugar back up if needed. No other episodes of hypoglycemia reported. Is patient diabetic?: Yes Is patient checking blood sugars at home?: Yes List most recent blood sugar readings, include date and time of day: FreeStyle ibre, typically about 120's fasting    Gastrointestinal Gastrointestinal Symptoms Reported: Not assessed      Genitourinary Genitourinary Symptoms Reported: Not assessed    Integumentary Integumentary Symptoms Reported: Wound Additional Integumentary Details: Patient reports that wound continues to get smaller. He is using a compression sock as recommended  by ortho. Next f/u 06/05/24. He is hopeful that the wound will be completely closed and he will be cleared to back to work. Skin Management Strategies: Routine screening (Blood sugar control)  Musculoskeletal Musculoskelatal Symptoms Reviewed: Not assessed        Psychosocial Psychosocial Symptoms Reported: Not assessed            04/23/2024    1:30 PM  Depression screen PHQ 2/9  Decreased Interest 0  Down, Depressed, Hopeless 0  PHQ - 2 Score 0  Altered sleeping 0  Tired, decreased energy 0  Change in appetite 0  Feeling bad or failure about yourself  0  Trouble concentrating 0  Moving slowly or fidgety/restless 0  Suicidal thoughts 0  PHQ-9 Score 0  Difficult doing work/chores Not difficult at all    There were no vitals filed for this visit.  Medications Reviewed Today     Reviewed by Ian Ian SQUIBB, RN (Registered Nurse) on 05/19/24 at [phone]  Med List Status: <None>   Medication Order Taking? Sig Documenting Provider Last Dose Status Informant  acetaminophen  (TYLENOL ) 500 MG tablet [id number] No Take 500 mg by mouth every 6 (six) hours as needed. [provider] Taking Active Self  amLODipine  (NORVASC ) 5 MG tablet [id number] No Take 5 mg by mouth daily. [provider] Taking Active Self  atorvastatin  (LIPITOR) 10 MG tablet [id number] No Take 1 tablet (10 mg total) by mouth daily. Ian Greig PARAS, NP Taking Active Self  Patient not taking:  Discontinued 10/06/19 1134 Continuous Glucose Sensor (FREESTYLE LIBRE 3 PLUS SENSOR) MISC [id number] No Use to check blood sugar continuously throughout the day. Change sensors once every 14  days. E11.42 Ian Greig PARAS, NP Taking Active   DULoxetine  (CYMBALTA ) 20 MG capsule [id number] No 1 capsule.  Patient not taking: Reported on 04/16/2024   [provider] Not Taking Active   gabapentin  (NEURONTIN ) 300 MG capsule [id number]  TAKE 1 CAPSULE(300 MG) BY MOUTH AT BEDTIME Ian Greig PARAS, NP  Active    glimepiride  (AMARYL ) 4 MG tablet [id number] No Take 1 tablet (4 mg total) by mouth daily before breakfast. Hughes, Ian Jaralla, MD Taking Active   hydrochlorothiazide  (HYDRODIURIL ) 12.5 MG tablet [id number]  TAKE 1 TABLET(12.5 MG) BY MOUTH DAILY Ian Greig PARAS, NP  Active   Insulin  Glargine (BASAGLAR  KWIKPEN) 100 UNIT/ML [id number] No Inject 20 Units into the skin at bedtime. Hughes, Ian Cardinal, MD Taking Active   Insulin  Pen Needle 32G X 4 MM MISC [id number] No 1 each by Other route daily. Use to inject insulin  once daily. Hughes, Ian Cardinal, MD Taking Active   isosorbide mononitrate (IMDUR) 60 MG 24 hr tablet [id number] No Take 60 mg by mouth daily.  Patient not taking: Reported on 04/16/2024   [provider] Not Taking Active            Med Note (Ian Hughes   Wed Apr 16, 2024  9:31 AM) Patient does not remember being prescribed medication. Not noted in cardiology note to be taking.  losartan  (COZAAR ) 50 MG tablet [id number]  TAKE 1 TABLET(50 MG) BY MOUTH DAILY Ian Greig PARAS, NP  Active   metFORMIN  (GLUCOPHAGE -XR) 500 MG 24 hr tablet [id number] No Take 2 tablets (1,000 mg total) by mouth 2 (two) times daily with a meal. Hughes, Ian Cardinal, MD Taking Active   Semaglutide ,0.25 or 0.5MG /DOS, (OZEMPIC , 0.25 OR 0.5 MG/DOSE,) 2 MG/3ML SOPN [id number]  Inject 0.25 mg into the skin once a week. Hughes, Enobong, MD  Active   TRUEplus Lancets 28G MISC [id number] No Use to check blood sugar 2 times a day Ian Greig PARAS, NP Taking Active Self            Recommendation:   Continue Current Plan of Care  Follow Up Plan:   Closing From:  Complex Care Management Patient has met all care management goals. Care Management case will be closed. Patient has been provided contact information should new needs arise.   Ian Finlay, RN MSN [place]  [facility] Population Health RN Care Manager Direct Dial: [phone]  Fax: [phone]

## 2024-05-19 NOTE — Patient Instructions (Signed)
 Visit Information  Thank you for taking time to visit with me today. Please don't hesitate to contact me if I can be of assistance to you before our next scheduled appointment.  Your next care management appointment is no further scheduled appointments.    Closing From: Complex Care Management. Patient has met all care management goals. Care Management case will be closed. Patient has been provided contact information should new needs arise.   Please call the care guide team at [phone] if you need to cancel, schedule, or reschedule an appointment.   Please call the Suicide and Crisis Lifeline: 988 call 1-800-273-TALK (toll free, 24 hour hotline) if you are experiencing a Mental Health or Behavioral Health Crisis or need someone to talk to.  Rosaline Finlay, RN MSN [place]  [facility] Population Health RN Care Manager Direct Dial: [phone]  Fax: [phone]

## 2024-06-02 ENCOUNTER — Encounter: Payer: Self-pay | Admitting: Pharmacist

## 2024-06-02 ENCOUNTER — Ambulatory Visit: Attending: Family | Admitting: Pharmacist

## 2024-06-02 DIAGNOSIS — E1165 Type 2 diabetes mellitus with hyperglycemia: Secondary | ICD-10-CM | POA: Diagnosis not present

## 2024-06-02 DIAGNOSIS — Z7984 Long term (current) use of oral hypoglycemic drugs: Secondary | ICD-10-CM | POA: Diagnosis not present

## 2024-06-02 DIAGNOSIS — Z794 Long term (current) use of insulin: Secondary | ICD-10-CM | POA: Diagnosis not present

## 2024-06-02 DIAGNOSIS — Z7985 Long-term (current) use of injectable non-insulin antidiabetic drugs: Secondary | ICD-10-CM | POA: Diagnosis not present

## 2024-06-02 MED ORDER — METFORMIN HCL ER 500 MG PO TB24: 500.0000 mg | ORAL_TABLET | Freq: Two times a day (BID) | ORAL | 1 refills | Status: DC

## 2024-06-02 MED ORDER — BASAGLAR KWIKPEN 100 UNIT/ML ~~LOC~~ SOPN: 24.0000 [IU] | PEN_INJECTOR | Freq: Every day | SUBCUTANEOUS | 3 refills | Status: DC

## 2024-06-02 NOTE — Progress Notes (Signed)
 S:     No chief complaint on file.  43 y.o. male who presents for diabetes evaluation, education, and management.  PMH is significant for HTN, T2DM complicated by non-adherence, diabetic neuropathy, obesity.   Patient was referred and last seen by Primary Care Provider, Greig Drones, on 04/23/2024. I last saw him on 04/25/2024 and added Ozempic .   Of note, he is also managed by Endocrine (Dr. Sam).  She last saw him 03/26/2024 and has an appt planned with him in August. Last A1c showed some improvement in May of this year (11.7, down from 14.1 in March) but is still above goal.  Today, he presents with his mother. He is doing okay overall but has two main concerns. The first is diarrhea that occurs every other day. Denies any abdominal pain, NV. Tells me that diarrhea has always been an issue with metformin  but has increased in frequency since adding Ozempic . His other concern is some hypoglycemia. While not showing up on is AGP report, he does have some objective values <70 mg/dL showing up on his daily report.   Family/Social History:  Fhx: DM, obesity Tobacco: never smoker Alcohol: none reported   Current diabetes medications include: Basaglar  20u daily, glimepiride  4 mg daily, metformin  1000 mg XR BID, Ozempic  0.25 mg weekly  Patient reports adherence to taking all medications as prescribed.  Insurance coverage: [employer]  Patient reports hypoglycemic events. Treats successfully and these occur infrequently.   Patient denies polyuria, polydipsia.  Patient reports neuropathy (nerve pain). Patient denies visual changes since last visit. Patient denies self foot exams.   Patient reported dietary habits:  -3 meals  -BF: limited by time.  -Lunch: varies - leftovers   -Dinner: usually a protein and two sides  -Starches: admits to regular intake of rice, beans, potatoes  -Snacks: denies any intake of sweets  -Dinners: Gatorade (regular), tea (sweet tea with  sugar)  Patient-reported exercise habits:  -Walks dog 45 minutes daily    O:  Date of Download: 06/02/2024 for a 28-day report % Time CGM is active: 70% Average Glucose: 143 mg/dL Glucose Management Indicator: 6.7  Glucose Variability: 25.3 (goal <36%) Time in Goal:  - Time in range 70-180: 85% - Time above range: 15% - Time below range: 0%  Lab Results  Component Value Date   HGBA1C 11.7 (A) 03/26/2024   There were no vitals filed for this visit.  Lipid Panel     Component Value Date/Time   CHOL 108 09/19/2023 1213   TRIG 86 09/19/2023 1213   HDL 41 09/19/2023 1213   CHOLHDL 2.6 09/19/2023 1213   CHOLHDL 3.3 06/22/2016 0958   VLDL 11 06/22/2016 0958   LDLCALC 50 09/19/2023 1213   LDLDIRECT 105 (H) 08/21/2013 1606    Clinical Atherosclerotic Cardiovascular Disease (ASCVD): No  The ASCVD Risk score (Arnett DK, et al., 2019) failed to calculate for the following reasons:   The valid total cholesterol range is 130 to 320 mg/dL   Patient is participating in a Managed Medicaid Plan: no   A/P: Diabetes longstanding currently uncontrolled based on A1c in May, however, AGP report from the last 28 days shows improvement. His symptoms of hyperglycemia are improving, however, he endorses some hypoglycemia. Daily report shows rare readings < 70 mg/dL. Patient is able to verbalize appropriate hypoglycemia management plan. Medication adherence appears to be improving. He is concerned with worsening diarrhea that he attributes to Ozempic . He is willing to continue Ozempic  at his current dose  for now and decrease metformin  dose in an attempt to improve this. Additionally, I have instructed him to stop glimepiride  for now and continue his current dose of insulin  to help resolve hypoglycemia. Ideally, we would increase Ozempic  today. However, in an effort to mitigate GI side effects, I have instructed him to continue the current 0.25 mg weekly dose until he sees Endo. Additionally, I have  given him instructions to increase insulin  dose to 24 units and contact me in 1 week if home blood sugars increase.  -DECREASE metformin  1000 mg XR BID to 500 mg XR BID.  -Continue Basaglar  20u daily. INCREASE dose to 24 units daily after 1 week if hyperglycemia occurs.  -Continue Ozempic  0.25 mg weekly for now. -DISCONTINUE glimepiride  4 mg daily.  -Patient educated on purpose, proper use, and potential adverse effects of Basaglar , Ozempic .  -Extensively discussed pathophysiology of diabetes, recommended lifestyle interventions, dietary effects on blood sugar control.  -Counseled on s/sx of and management of hypoglycemia.  -Next A1c anticipated 06/2023.   Written patient instructions provided. Patient verbalized understanding of treatment plan.  Total time in face to face counseling 30 minutes.    Follow-up:  Pharmacist in October. Endocrine: 07/02/2024 PCP: 07/25/2024  Herlene Fleeta Morris, PharmD, BCACP, CPP Clinical Pharmacist [facility] [phone]

## 2024-06-04 ENCOUNTER — Encounter: Admitting: Physical Medicine and Rehabilitation

## 2024-06-05 ENCOUNTER — Ambulatory Visit (INDEPENDENT_AMBULATORY_CARE_PROVIDER_SITE_OTHER): Admitting: Orthopedic Surgery

## 2024-06-05 DIAGNOSIS — L97911 Non-pressure chronic ulcer of unspecified part of right lower leg limited to breakdown of skin: Secondary | ICD-10-CM | POA: Diagnosis not present

## 2024-06-08 ENCOUNTER — Encounter: Payer: Self-pay | Admitting: Orthopedic Surgery

## 2024-06-08 NOTE — Progress Notes (Signed)
 Office Visit Note   Patient: Ian Hughes           Date of Birth: 1981/10/16           MRN: [id number] Visit Date: 06/05/2024              Requested by: [name], NP [address] [place],  [place] [address] PCP: [name], NP  Chief Complaint  Patient presents with   Right Leg - Routine Post Op    4/2 and 02/22/24 I&D right leg      HPI: Patient is a 43 year old gentleman who presents in follow-up for irrigation debridement right leg with current Vashe dressing changes and compression socks.   Patient is over 3 months status post serial debridement.  Assessment & Plan: Visit Diagnoses: No diagnosis found.  Plan: The wound continues to heal well.  Continue with the compression sock.  Follow-Up Instructions: No follow-ups on file.   Ortho Exam  Patient is alert, oriented, no adenopathy, well-dressed, normal affect, normal respiratory effort. Examination the wound bed has flat healthy granulation tissue it measures 2 x 1 cm lateral right calf.    Imaging: No results found.   Labs: Lab Results  Component Value Date   HGBA1C 11.7 (A) 03/26/2024   HGBA1C 14.1 (A) 01/22/2024   HGBA1C 12.8 (H) 01/08/2024   REPTSTATUS 02/25/2024 FINAL 02/20/2024   GRAMSTAIN  02/20/2024    FEW WBC PRESENT, PREDOMINANTLY PMN NO ORGANISMS SEEN    CULT  02/20/2024    FEW STAPHYLOCOCCUS AUREUS NO ANAEROBES ISOLATED Performed at [facility], [address], [place], [place] [address]    [facility] STAPHYLOCOCCUS AUREUS 02/20/2024     Lab Results  Component Value Date   ALBUMIN 3.3 (L) 02/14/2024   ALBUMIN 4.3 06/21/2020   ALBUMIN 4.3 12/12/2019    Lab Results  Component Value Date   MG 2.0 02/16/2024   Lab Results  Component Value Date   VD25OH 29 (L) 09/08/2014   VD25OH 36 08/21/2013   VD25OH 24 (L) 05/30/2012    No results found for: PREALBUMIN    Latest Ref Rng & Units 02/23/2024    5:19 AM 02/21/2024    6:24 AM 02/20/2024    5:45 AM   CBC EXTENDED  WBC 4.0 - 10.5 K/uL 10.7  11.4  8.1   RBC 4.22 - 5.81 MIL/uL 3.77  4.23  4.37   Hemoglobin 13.0 - 17.0 g/dL 89.8  88.3  88.0   HCT 39.0 - 52.0 % 31.1  34.7  35.3   Platelets 150 - 400 K/uL 370  403  400   NEUT# 1.7 - 7.7 K/uL   5.5   Lymph# 0.7 - 4.0 K/uL   1.4      There is no height or weight on file to calculate BMI.  Orders:  No orders of the defined types were placed in this encounter.  No orders of the defined types were placed in this encounter.    Procedures: No procedures performed  Clinical Data: No additional findings.  ROS:  All other systems negative, except as noted in the HPI. Review of Systems  Objective: Vital Signs: There were no vitals taken for this visit.  Specialty Comments:  No specialty comments available.  PMFS History: Patient Active Problem List   Diagnosis Date Noted   Infected abrasion of right leg 02/20/2024   Abscess of right lower leg 02/18/2024   Cellulitis of right lower leg 02/14/2024  AKI (acute kidney injury) (HCC) 02/14/2024   Type 2 diabetes mellitus with diabetic polyneuropathy, with long-term current use of insulin  (HCC) 06/13/2022   Type 2 diabetes mellitus with hyperglycemia, with long-term current use of insulin  (HCC) 06/13/2022   Insulin  dependent type 2 diabetes mellitus (HCC) 02/11/2020   Glucosuria 02/11/2020   Non-restorative sleep 02/11/2020   Excessive daytime sleepiness 02/11/2020   Sleep related headaches 02/11/2020   Nocturia more than twice per night 02/11/2020   UARS (upper airway resistance syndrome) 04/07/2015   Severe obesity (BMI >= 40) (HCC) 04/07/2015   Primary snoring 04/07/2015   Snoring 12/21/2014   Drowsiness 12/21/2014   Witnessed apneic spells 12/21/2014   ED (erectile dysfunction) 09/24/2012   Type 2 diabetes mellitus with obesity (HCC) 07/04/2009    Class: Chronic   OBESITY, NOS 01/17/2007   BELLS PALSY 01/17/2007   HYPERTENSION, BENIGN SYSTEMIC 01/17/2007   Past  Medical History:  Diagnosis Date   Cataract 2011   surgery on R eye   Diabetes mellitus without complication (HCC)    High cholesterol    Obesity     Family History  Problem Relation Age of Onset   Diabetes Mother    Cancer Father        leukemia    Obesity Other     Past Surgical History:  Procedure Laterality Date   EYE SURGERY Left 2011   cataract   EYE SURGERY Right 2012   INCISION AND DRAINAGE OF DEEP ABSCESS, CALF Right 02/20/2024   Procedure: INCISION AND DRAINAGE OF DEEP ABSCESS, CALF;  Surgeon: [name] Jerona GAILS, MD;  Location: [facility] OR;  Service: Orthopedics;  Laterality: Right;  RIGHT LEG DEBRIDEMENT   INCISION AND DRAINAGE OF WOUND Right 02/22/2024   Procedure: IRRIGATION AND DEBRIDEMENT WOUND;  Surgeon: [name] Jerona GAILS, MD;  Location: [facility] OR;  Service: Orthopedics;  Laterality: Right;  IRRIGATION AND DEBRIDEMENT RIGHT LEG   Social History   Occupational History    Employer: [employer]  Tobacco Use   Smoking status: Never    Passive exposure: Never   Smokeless tobacco: Never  Vaping Use   Vaping status: Never Used  Substance and Sexual Activity   Alcohol use: Yes    Comment: 1 or 2 every 3 months    Drug use: No   Sexual activity: Not on file

## 2024-06-10 ENCOUNTER — Encounter: Payer: Self-pay | Admitting: Skilled Nursing Facility1

## 2024-06-10 ENCOUNTER — Encounter: Attending: Family | Admitting: Skilled Nursing Facility1

## 2024-06-10 DIAGNOSIS — Z794 Long term (current) use of insulin: Secondary | ICD-10-CM | POA: Insufficient documentation

## 2024-06-10 DIAGNOSIS — E119 Type 2 diabetes mellitus without complications: Secondary | ICD-10-CM | POA: Insufficient documentation

## 2024-06-10 NOTE — Progress Notes (Unsigned)
 Other Dx: Hyperlipidemia   A1C: 11.7  DM medications: Ozempic  Insuline  Metformin   Pt states his CGM was not working so he has been doing finger sticks: fasting 130-135, before lunch 168, before dinner 150 Pt states he stopped drinking soda 4 years ago.  Pt states he works 5am to [employer] but stays for 3-4 extra hours 50+ hours  Pt states he has had since starting his Ozempic .   24 hr recall: First meal: smoothie: fruit + protein powder + milk + [place] bacon and eggs Snack: protein bars Second meal: (moms making lunch) grilled chicken + green beans  Snack: protein bars Third meal: chicken + collards and rice Snack: protein bar  Beverages: water   Goals: Check 2 hours after a meal   Add spinach or other non starchy vegetables to your smoothie

## 2024-06-12 ENCOUNTER — Other Ambulatory Visit: Payer: Self-pay

## 2024-06-30 ENCOUNTER — Ambulatory Visit (INDEPENDENT_AMBULATORY_CARE_PROVIDER_SITE_OTHER): Admitting: Neurology

## 2024-06-30 DIAGNOSIS — G629 Polyneuropathy, unspecified: Secondary | ICD-10-CM

## 2024-06-30 DIAGNOSIS — R202 Paresthesia of skin: Secondary | ICD-10-CM | POA: Diagnosis not present

## 2024-06-30 NOTE — Procedures (Signed)
 [facility]  [address] [place], [address]  [place], [place] [address] Tel: [phone] Fax: [phone] Test Date:  06/30/2024  Patient: Ian Hughes DOB: 1981/06/30 Physician: Venetia Potters, MD  Sex: Male Height: 5' 10 Ref Phys: Jerona Sage, MD  ID#: [id number]   Technician:    History: This is a 43 year old male with hand weakness.  NCV & EMG Findings: Extensive electrodiagnostic evaluation of bilateral upper limbs shows: Bilateral median, ulnar, and radial sensory responses are absent. Bilateral ulnar (ADM) and right median (APB) motor responses are absent. Left median (APB) motor response shows prolonged distal onset latency (5.6 ms), reduced amplitude (1.20 mV), and decreased conduction velocity (39 m/s). Chronic motor axon loss changes without accompanying active denervation changes are seen in bilateral first dorsal interosseous, abductor digiti minimi, flexor digitorum profundus to digits 4,5, and abductor pollicis brevis muscles.  Impression: This is an abnormal study. The findings are most consistent with the following: Upper limb manifestations of a large fiber sensorimotor polyneuropathy, predominantly axonal in type, likely very severe in degree electrically. There is likely overlapping bilateral ulnar mononeuropathies proximal to the take off to the flexor digitorum profundus to digits 4,5 in the proximal forearm. Given #1 above, further localization is not possible. Possible overlapping bilateral median mononeuropathy at or distal to the wrist (ie: carpal tunnel syndrome) but this is unclear due to severity of #1 above. No definite electrodiagnostic evidence of right or left cervical (C5-T1) motor radiculopathy.    ___________________________ Venetia Potters, MD    Nerve Conduction Studies Motor Nerve Results    Latency Amplitude F-Lat Segment Distance CV Comment  Site (ms) Norm (mV) Norm (ms)  (cm) (m/s) Norm   Left Median (APB) Motor  Wrist *5.6  < 3.9  *1.20  > 6.0        Elbow 13.7 - 0.96 -  Elbow-Wrist 31.5 *39  > 50   Right Median (APB) Motor  Wrist *NR  < 3.9 *NR  > 6.0        Elbow *NR - *NR -  Elbow-Wrist - *NR  > 50   Left Ulnar (ADM) Motor  Wrist *NR  < 3.1 *NR  > 7.0        Bel elbow *NR - *NR -  Bel elbow-Wrist - *NR  > 50   Ab elbow *NR - *NR -  Ab elbow-Bel elbow - *NR -   Right Ulnar (ADM) Motor  Wrist *NR  < 3.1 *NR  > 7.0        Bel elbow *NR - *NR -  Bel elbow-Wrist - *NR  > 50   Ab elbow *NR - *NR -  Ab elbow-Bel elbow - *NR -    Sensory Sites    Neg Peak Lat Amplitude (O-P) Segment Distance Velocity Comment  Site (ms) Norm (V) Norm  (cm) (ms)   Left Median Sensory  Wrist-Dig II *NR  < 3.4 *NR  > 20 Wrist-Dig II 13    Right Median Sensory  Wrist-Dig II *NR  < 3.4 *NR  > 20 Wrist-Dig II 13    Left Radial Sensory  Forearm-Wrist *NR  < 2.7 *NR  > 18 Forearm-Wrist 10    Right Radial Sensory  Forearm-Wrist *NR  < 2.7 *NR  > 18 Forearm-Wrist 10    Left Ulnar Sensory  Wrist-Dig V *NR  < 3.1 *NR  > 12 Wrist-Dig V 11    Right Ulnar Sensory  Wrist-Dig V *NR  < 3.1 *NR  >  12 Wrist-Dig V 11     Electromyography   Side Muscle Ins.Act Fibs Fasc Recrt Amp Dur Poly Activation Comment  Right FDI Nml Nml Nml *SMU *1+ *1+ *1+ *2- *ATR  Right ADM Nml Nml Nml *SMU *1+ *1+ *1+ *2- *ATR  Right EIP Nml Nml Nml Nml Nml Nml Nml Nml N/A  Right Pronator teres Nml Nml Nml Nml Nml Nml Nml Nml N/A  Right APB Nml Nml Nml *SMU Nml Nml *1+ *2- *ATR  Right FDP Nml Nml Nml *1- *1+ *1+ *1+ Nml N/A  Right Biceps Nml Nml Nml Nml Nml Nml Nml Nml N/A  Right Triceps Nml Nml Nml Nml Nml Nml Nml Nml N/A  Right Deltoid Nml Nml Nml Nml Nml Nml Nml Nml N/A  Left FDI Nml Nml Nml *SMU *1+ *1+ *1+ *2- *ATR  Left ADM Nml Nml Nml *SMU *1+ *1+ *1+ *2- *ATR  Left EIP Nml Nml Nml Nml Nml Nml Nml Nml N/A  Left Pronator teres Nml Nml Nml Nml Nml Nml Nml Nml N/A  Left APB Nml Nml Nml *SMU *1+ *1+ *1+ *2- *ATR  Left Triceps Nml Nml Nml Nml Nml Nml Nml Nml  N/A  Left Biceps Nml Nml Nml Nml Nml Nml Nml Nml N/A  Left FDP Nml Nml Nml *1- *1+ *1+ *1+ Nml N/A  Left Deltoid Nml Nml Nml Nml Nml Nml Nml Nml N/A      Waveforms:  Motor           Sensory

## 2024-07-02 ENCOUNTER — Other Ambulatory Visit: Payer: Self-pay

## 2024-07-02 ENCOUNTER — Ambulatory Visit (INDEPENDENT_AMBULATORY_CARE_PROVIDER_SITE_OTHER): Admitting: Internal Medicine

## 2024-07-02 ENCOUNTER — Encounter: Payer: Self-pay | Admitting: Internal Medicine

## 2024-07-02 VITALS — BP 126/84 | HR 95 | Ht 70.0 in | Wt 257.0 lb

## 2024-07-02 DIAGNOSIS — Z794 Long term (current) use of insulin: Secondary | ICD-10-CM | POA: Diagnosis not present

## 2024-07-02 DIAGNOSIS — E1142 Type 2 diabetes mellitus with diabetic polyneuropathy: Secondary | ICD-10-CM | POA: Diagnosis not present

## 2024-07-02 DIAGNOSIS — E1165 Type 2 diabetes mellitus with hyperglycemia: Secondary | ICD-10-CM

## 2024-07-02 DIAGNOSIS — R29898 Other symptoms and signs involving the musculoskeletal system: Secondary | ICD-10-CM

## 2024-07-02 LAB — POCT GLYCOSYLATED HEMOGLOBIN (HGB A1C): Hemoglobin A1C: 8.8 % — AB (ref 4.0–5.6)

## 2024-07-02 LAB — POCT GLUCOSE (DEVICE FOR HOME USE): Glucose Fasting, POC: 202 mg/dL — AB (ref 70–99)

## 2024-07-02 MED ORDER — OZEMPIC (0.25 OR 0.5 MG/DOSE) 2 MG/3ML ~~LOC~~ SOPN: 0.2500 mg | PEN_INJECTOR | SUBCUTANEOUS | 3 refills | Status: DC

## 2024-07-02 MED ORDER — INSULIN PEN NEEDLE 32G X 4 MM MISC
1.0000 | Freq: Every day | 3 refills | Status: DC
Start: 2024-07-02 — End: 2024-09-10

## 2024-07-02 MED ORDER — GLIMEPIRIDE 2 MG PO TABS: 2.0000 mg | ORAL_TABLET | Freq: Every day | ORAL | 3 refills | Status: DC

## 2024-07-02 MED ORDER — FREESTYLE LIBRE 3 PLUS SENSOR MISC: 3 refills | Status: AC

## 2024-07-02 MED ORDER — BASAGLAR KWIKPEN 100 UNIT/ML ~~LOC~~ SOPN: 24.0000 [IU] | PEN_INJECTOR | Freq: Every day | SUBCUTANEOUS | 3 refills | Status: DC

## 2024-07-02 NOTE — Patient Instructions (Signed)
 STOP  metformin  500 mg, 2 tablets with breakfast 2 tablets with dinner Restart Glimepiride  2 mg, 1 tablet before breakfast Continue Ozempic  0.25 mg weekly  Continue Basaglar   24 units daily   HOW TO TREAT LOW BLOOD SUGARS (Blood sugar LESS THAN 70 MG/DL) Please follow the RULE OF 15 for the treatment of hypoglycemia treatment (when your (blood sugars are less than 70 mg/dL)   STEP 1: Take 15 grams of carbohydrates when your blood sugar is low, which includes:  3-4 GLUCOSE TABS  OR 3-4 OZ OF JUICE OR REGULAR SODA OR ONE TUBE OF GLUCOSE GEL    STEP 2: RECHECK blood sugar in 15 MINUTES STEP 3: If your blood sugar is still low at the 15 minute recheck --> then, go back to STEP 1 and treat AGAIN with another 15 grams of carbohydrates.

## 2024-07-02 NOTE — Progress Notes (Signed)
 Name: Ian Hughes  Age/ Sex: 43 y.o., male   MRN/ DOB: [id number], November 01, 1981     PCP: [name], NP   Reason for Endocrinology Evaluation: Type 2 Diabetes Mellitus  Initial Endocrine Consultative Visit: 07/10/2017    PATIENT IDENTIFIER: [name] is a 43 y.o. male with a past medical history of T2DM, dyslipidemia, HTN. The patient has followed with Endocrinology clinic since 07/10/2017 for consultative assistance with management of his diabetes.  DIABETIC HISTORY:  [name] was diagnosed with DM in 2009, he was started on insulin  in 2019  . His hemoglobin A1c has ranged from 7.1% in 2016, peaking at 15.5% in 2021.  He last saw Dr. Kassie in September 2021  SUBJECTIVE:   During the last visit (03/26/2024): A1c 11.7%  Today (07/02/2024): [name]  is here for a diabetes management. He checks his blood sugars 3 times daily though freestyle libre .  He did not bring a meter.  He has no hypoglycemia    Patient required I&D of a right leg abscess 02/2024, he continues to follow-up with [name] for right lateral leg ulcer  Patient has been seen by [facility].  He does follow-up with neurology for peripheral neuropathy, recent NCV & EMG findings were abnormal of the upper extremities  Since his last visit here, the patient was evaluated by clinical pharmacist at PCPs office which decreased his metformin  by 50% due to diarrhea, was started on Ozempic , and glimepiride  4 mg was discontinued due to hypoglycemia and insulin  was increased   Denies nausea, vomiting Pt continues to endorses diarrhea every 2 days    HOME DIABETES REGIMEN:  Metformin  500 mg 1 tabs BID Glimepiride  4 mg daily   Ozempic  0.25 mg weekly Basaglar  24 units daily     Statin: Yes ACE-I/ARB: Yes    METER DOWNLOAD SUMMARY: n/a    DIABETIC COMPLICATIONS: Microvascular complications:   Denies: CKD, neuropathy  Last Eye Exam: Completed years ago    Macrovascular complications:   Denies: CAD, CVA, PVD   HISTORY:  Past Medical History:  Past Medical History:  Diagnosis Date   Cataract 2011   surgery on R eye   Diabetes mellitus without complication (HCC)    High cholesterol    Obesity    Past Surgical History:  Past Surgical History:  Procedure Laterality Date   EYE SURGERY Left 2011   cataract   EYE SURGERY Right 2012   INCISION AND DRAINAGE OF DEEP ABSCESS, CALF Right 02/20/2024   Procedure: INCISION AND DRAINAGE OF DEEP ABSCESS, CALF;  Surgeon: [name] Jerona GAILS, MD;  Location: [facility] OR;  Service: Orthopedics;  Laterality: Right;  RIGHT LEG DEBRIDEMENT   INCISION AND DRAINAGE OF WOUND Right 02/22/2024   Procedure: IRRIGATION AND DEBRIDEMENT WOUND;  Surgeon: [name] Jerona GAILS, MD;  Location: [facility] OR;  Service: Orthopedics;  Laterality: Right;  IRRIGATION AND DEBRIDEMENT RIGHT LEG   Social History:  reports that he has never smoked. He has never been exposed to tobacco smoke. He has never used smokeless tobacco. He reports current alcohol use. He reports that he does not use drugs. Family History:  Family History  Problem Relation Age of Onset   Diabetes Mother    Cancer Father        leukemia    Obesity Other      HOME MEDICATIONS: Allergies as of 07/02/2024   No Known Allergies      Medication List  Accurate as of July 02, 2024  8:09 AM. If you have any questions, ask your nurse or doctor.          acetaminophen  500 MG tablet Commonly known as: TYLENOL  Take 500 mg by mouth every 6 (six) hours as needed.   amLODipine  5 MG tablet Commonly known as: NORVASC  Take 5 mg by mouth daily.   atorvastatin  10 MG tablet Commonly known as: LIPITOR Take 1 tablet (10 mg total) by mouth daily.   Basaglar  KwikPen 100 UNIT/ML Inject 24 Units into the skin at bedtime.   DULoxetine  20 MG capsule Commonly known as: CYMBALTA  1 capsule.   FreeStyle Libre 3 Plus Sensor Misc Use to check blood sugar continuously  throughout the day. Change sensors once every 14 days. E11.42   gabapentin  300 MG capsule Commonly known as: NEURONTIN  TAKE 1 CAPSULE(300 MG) BY MOUTH AT BEDTIME   hydrochlorothiazide  12.5 MG tablet Commonly known as: HYDRODIURIL  TAKE 1 TABLET(12.5 MG) BY MOUTH DAILY   Insulin  Pen Needle 32G X 4 MM Misc 1 each by Other route daily. Use to inject insulin  once daily.   isosorbide mononitrate 60 MG 24 hr tablet Commonly known as: IMDUR Take 60 mg by mouth daily.   losartan  50 MG tablet Commonly known as: COZAAR  TAKE 1 TABLET(50 MG) BY MOUTH DAILY   metFORMIN  500 MG 24 hr tablet Commonly known as: GLUCOPHAGE -XR Take 1 tablet (500 mg total) by mouth 2 (two) times daily with a meal.   Ozempic  (0.25 or 0.5 MG/DOSE) 2 MG/3ML Sopn Generic drug: Semaglutide (0.25 or 0.5MG /DOS) Inject 0.25 mg into the skin once a week.   TRUEplus Lancets 28G Misc Use to check blood sugar 2 times a day         OBJECTIVE:   Vital Signs: BP 126/84 (BP Location: Left Arm, Patient Position: Sitting, Cuff Size: Normal)   Pulse 95   Ht 5' 10 (1.778 m)   Wt 257 lb (116.6 kg)   SpO2 99%   BMI 36.88 kg/m   Wt Readings from Last 3 Encounters:  07/02/24 257 lb (116.6 kg)  04/23/24 248 lb 6.4 oz (112.7 kg)  04/08/24 241 lb (109.3 kg)     Exam: General: Pt appears well and is in NAD  Neck: Thyroid: Thyroid size normal.  No goiter or nodules appreciated.   Lungs: Clear with good BS bilat   Heart: RRR   Abdomen: Soft, nontender  Extremities: 1+ pretibial edema.   Neuro: MS is good with appropriate affect, pt is alert and Ox3    DM foot exam: 06/05/2024 per [name]     DATA REVIEWED:  Lab Results  Component Value Date   HGBA1C 8.8 (A) 07/02/2024   HGBA1C 11.7 (A) 03/26/2024   HGBA1C 14.1 (A) 01/22/2024    Latest Reference Range & Units 02/23/24 05:19  Sodium 135 - 145 mmol/L 133 (L)  Potassium 3.5 - 5.1 mmol/L 4.0  Chloride 98 - 111 mmol/L 98  CO2 22 - 32 mmol/L 24  Glucose 70 -  99 mg/dL 679 (H)  BUN 6 - 20 mg/dL 13  Creatinine 9.38 - 8.75 mg/dL 9.16  Calcium  8.9 - 10.3 mg/dL 8.4 (L)  Anion gap 5 - 15  11  GFR, Estimated >60 mL/min >60   In office BG 202 Mg/DL  ASSESSMENT / PLAN / RECOMMENDATIONS:   1) Type 2 Diabetes Mellitus, poorly controlled, With Neuropathic complications - Most recent A1c of 8.8 %. Goal A1c <7.0%.    -A1c has trended down  from 11.7% to 8.8% -He used to be on Januvia , but this has been off his list for unknown reasons -Patient continues with diarrhea despite decreasing metformin  by 50%, I have recommended discontinuing metformin  -Due to diarrhea, unable to increase Ozempic  at this time -I have refilled freestyle libre 3 -I have advised the patient to restart glimepiride  but instead of 4 mg to start at 2 mg before breakfast, emphasized the importance of taking it 15-20 minutes before breakfast, cautioned against hypoglycemia -No change to insulin  at this time    MEDICATIONS: - Stop Metformin   - Restart glimepiride  at 2 mg, 1 tablet before breakfast -Continue Ozempic  0.25 mg weekly -Continue Basaglar  24 units daily  EDUCATION / INSTRUCTIONS: BG monitoring instructions: Patient is instructed to check his blood sugars 3 times a day, before each meal. Call [facility] if: BG persistently < 70  I reviewed the Rule of 15 for the treatment of hypoglycemia in detail with the patient. Literature supplied.   2) Diabetic complications:  Eye: Does not have known diabetic retinopathy.  Neuro/ Feet: Does  have known diabetic peripheral neuropathy .  Renal: Patient does not have known baseline CKD. He   is  on an ACEI/ARB at present.      Recommend continuation of current Tx with primary MD /Clinical pharmacist and consultative f/u at [facility] Endocrine clinic in the future if pt's DM control becomes problematic, as there is no reason to have multiple providers making drastic changes to his glycemic regimen.    Signed  electronically by: Stefano Redgie Butts, MD  [facility] Endocrinology  [facility] [address] [place], [place] [address] Phone: [phone] FAX: [phone]   CC: [name], NP [address] [place] [address] Phone: [phone]  Fax: [phone]  Return to Endocrinology clinic as below: Future Appointments  Date Time Provider Department Center  07/02/2024  8:10 AM Daesha Insco, Donell Redgie, MD [facility] None  07/10/2024 10:30 AM [name] Jerona GAILS, MD [facility] None  07/25/2024  1:00 PM [name], NP [facility] None  09/02/2024 10:30 AM Fleeta Tonia Garnette LITTIE, RPH-CPP [facility] None  09/15/2024  4:30 PM Scotece, Thersia NOVAK, RD [facility] NDM

## 2024-07-10 ENCOUNTER — Ambulatory Visit: Admitting: Orthopedic Surgery

## 2024-07-10 ENCOUNTER — Encounter: Payer: Self-pay | Admitting: Orthopedic Surgery

## 2024-07-10 DIAGNOSIS — L97911 Non-pressure chronic ulcer of unspecified part of right lower leg limited to breakdown of skin: Secondary | ICD-10-CM | POA: Diagnosis not present

## 2024-07-10 NOTE — Progress Notes (Signed)
 Office Visit Note   Patient: Ian Hughes           Date of Birth: 1980-12-21           MRN: [id number] Visit Date: 07/10/2024              Requested by: [name], NP [address] [place],  [place] [address] PCP: [name], NP  Chief Complaint  Patient presents with   Right Leg - Follow-up    4/2 and 02/22/24 I&D right leg      HPI: Patient is a 43 year old gentleman who is over 4 months status post debridement wound right leg x 2.  Patient states he recently struck his leg on a machine and has a small open wound anteriorly.  Assessment & Plan: Visit Diagnoses:  1. Ulcer of right lower extremity, limited to breakdown of skin [facility])     Plan: Patient will continue with compression.  An Ace and 4 x 4 was applied today.  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  Follow-Up Instructions: Return in about 4 weeks (around 08/07/2024).   Ortho Exam  Patient is alert, oriented, no adenopathy, well-dressed, normal affect, normal respiratory effort. Examination the lateral wound continues to heal quite well.  It is now 1 x 3 cm with flat granulation tissue.  He has a new anterior tibial wound that is 1 cm diameter.  Patient still has venous swelling.    Imaging: No results found.    Labs: Lab Results  Component Value Date   HGBA1C 8.8 (A) 07/02/2024   HGBA1C 11.7 (A) 03/26/2024   HGBA1C 14.1 (A) 01/22/2024   REPTSTATUS 02/25/2024 FINAL 02/20/2024   GRAMSTAIN  02/20/2024    FEW WBC PRESENT, PREDOMINANTLY PMN NO ORGANISMS SEEN    CULT  02/20/2024    FEW STAPHYLOCOCCUS AUREUS NO ANAEROBES ISOLATED Performed at [facility] Lab, [address] [place], [place] [address]    [facility] STAPHYLOCOCCUS AUREUS 02/20/2024     Lab Results  Component Value Date   ALBUMIN 3.3 (L) 02/14/2024   ALBUMIN 4.3 06/21/2020   ALBUMIN 4.3 12/12/2019    Lab Results  Component Value Date   MG 2.0 02/16/2024   Lab Results   Component Value Date   VD25OH 29 (L) 09/08/2014   VD25OH 36 08/21/2013   VD25OH 24 (L) 05/30/2012    No results found for: PREALBUMIN    Latest Ref Rng & Units 02/23/2024    5:19 AM 02/21/2024    6:24 AM 02/20/2024    5:45 AM  CBC EXTENDED  WBC 4.0 - 10.5 K/uL 10.7  11.4  8.1   RBC 4.22 - 5.81 MIL/uL 3.77  4.23  4.37   Hemoglobin 13.0 - 17.0 g/dL 89.8  88.3  88.0   HCT 39.0 - 52.0 % 31.1  34.7  35.3   Platelets 150 - 400 K/uL 370  403  400   NEUT# 1.7 - 7.7 K/uL   5.5   Lymph# 0.7 - 4.0 K/uL   1.4      There is no height or weight on file to calculate BMI.  Orders:  No orders of the defined types were placed in this encounter.  No orders of the defined types were placed in this encounter.    Procedures: No procedures performed  Clinical Data: No additional findings.  ROS:  All other systems negative, except as noted in the HPI. Review of  Systems  Objective: Vital Signs: There were no vitals taken for this visit.  Specialty Comments:  No specialty comments available.  PMFS History: Patient Active Problem List   Diagnosis Date Noted   Infected abrasion of right leg 02/20/2024   Abscess of right lower leg 02/18/2024   Cellulitis of right lower leg 02/14/2024   AKI (acute kidney injury) (HCC) 02/14/2024   Type 2 diabetes mellitus with diabetic polyneuropathy, with long-term current use of insulin  (HCC) 06/13/2022   Type 2 diabetes mellitus with hyperglycemia, with long-term current use of insulin  (HCC) 06/13/2022   Insulin  dependent type 2 diabetes mellitus (HCC) 02/11/2020   Glucosuria 02/11/2020   Non-restorative sleep 02/11/2020   Excessive daytime sleepiness 02/11/2020   Sleep related headaches 02/11/2020   Nocturia more than twice per night 02/11/2020   UARS (upper airway resistance syndrome) 04/07/2015   Severe obesity (BMI >= 40) (HCC) 04/07/2015   Primary snoring 04/07/2015   Snoring 12/21/2014   Drowsiness 12/21/2014   Witnessed apneic spells  12/21/2014   ED (erectile dysfunction) 09/24/2012   Type 2 diabetes mellitus with obesity (HCC) 07/04/2009    Class: Chronic   OBESITY, NOS 01/17/2007   BELLS PALSY 01/17/2007   HYPERTENSION, BENIGN SYSTEMIC 01/17/2007   Past Medical History:  Diagnosis Date   Cataract 2011   surgery on R eye   Diabetes mellitus without complication (HCC)    High cholesterol    Obesity     Family History  Problem Relation Age of Onset   Diabetes Mother    Cancer Father        leukemia    Obesity Other     Past Surgical History:  Procedure Laterality Date   EYE SURGERY Left 2011   cataract   EYE SURGERY Right 2012   INCISION AND DRAINAGE OF DEEP ABSCESS, CALF Right 02/20/2024   Procedure: INCISION AND DRAINAGE OF DEEP ABSCESS, CALF;  Surgeon: [name] Jerona GAILS, MD;  Location: [facility] OR;  Service: Orthopedics;  Laterality: Right;  RIGHT LEG DEBRIDEMENT   INCISION AND DRAINAGE OF WOUND Right 02/22/2024   Procedure: IRRIGATION AND DEBRIDEMENT WOUND;  Surgeon: [name] Jerona GAILS, MD;  Location: [facility] OR;  Service: Orthopedics;  Laterality: Right;  IRRIGATION AND DEBRIDEMENT RIGHT LEG   Social History   Occupational History    Employer: [employer]  Tobacco Use   Smoking status: Never    Passive exposure: Never   Smokeless tobacco: Never  Vaping Use   Vaping status: Never Used  Substance and Sexual Activity   Alcohol use: Yes    Comment: 1 or 2 every 3 months    Drug use: No   Sexual activity: Not on file

## 2024-07-15 ENCOUNTER — Other Ambulatory Visit (INDEPENDENT_AMBULATORY_CARE_PROVIDER_SITE_OTHER)

## 2024-07-15 ENCOUNTER — Ambulatory Visit (INDEPENDENT_AMBULATORY_CARE_PROVIDER_SITE_OTHER): Admitting: Orthopedic Surgery

## 2024-07-15 DIAGNOSIS — G5603 Carpal tunnel syndrome, bilateral upper limbs: Secondary | ICD-10-CM

## 2024-07-15 DIAGNOSIS — G5602 Carpal tunnel syndrome, left upper limb: Secondary | ICD-10-CM | POA: Diagnosis not present

## 2024-07-15 DIAGNOSIS — R29898 Other symptoms and signs involving the musculoskeletal system: Secondary | ICD-10-CM

## 2024-07-15 DIAGNOSIS — G5601 Carpal tunnel syndrome, right upper limb: Secondary | ICD-10-CM | POA: Diagnosis not present

## 2024-07-15 DIAGNOSIS — G5623 Lesion of ulnar nerve, bilateral upper limbs: Secondary | ICD-10-CM

## 2024-07-15 NOTE — Progress Notes (Signed)
 Ian Hughes - 43 y.o. male MRN [id number]  Date of birth: 16-May-1981  Office Visit Note: Visit Date: 07/15/2024 PCP: Ian Greig PARAS, NP Referred by: Ian Greig PARAS, NP  Subjective: No chief complaint on file.  HPI: Ian Hughes is a pleasant 43 y.o. male who presents today for evaluation of bilateral hand numbness and weakness which is progressive in nature.  States that the right is worse than the left side.  Symptoms have been present for the past 1 year, worsening in nature.  He states that his weakness is becoming more pronounced.  He does have a history notable for type 2 diabetes, on insulin .  Has not undergone any prior treatments for the upper extremities.  Does have recent EMG studies which do show significant neuropathy bilaterally to both the ulnar and median nerves.  He was sent to me by my partner [name] for specific hand surgical evaluation.  Pertinent ROS were reviewed with the patient and found to be negative unless otherwise specified above in HPI.   Visit Reason:Bilateral hand weakness-right is worse Duration of symptoms:1 year Hand dominance: left Occupation:fork lift operator  Diabetic: Type 2 diabetes mellitus with obesity (HCC) Insulin  dependent type 2 diabetes mellitus (HCC) Type 2 diabetes mellitus with diabetic polyneuropathy, with long-term current use of insulin  (HCC) Type 2 diabetes mellitus with hyperglycemia, with long-term current use of insulin  (HCC) Smoking: No Heart/Lung History:UARS (upper airway resistance syndrome)  Blood Thinners: none  Prior Testing/EMG:EMG on 06/30/24 Injections (Date):none Treatments:none Prior Surgery:none  Assessment & Plan: Visit Diagnoses:  1. Weakness of right hand   2. Left hand weakness   3. Cubital tunnel syndrome, bilateral   4. Carpal tunnel syndrome, bilateral     Plan: Extensive discussion was had with patient today regarding his bilateral upper extremities.  I reviewed the results of his  electrodiagnostic study with him in detail today.  Given the significance of his symptoms and the findings on the electrodiagnostic study, I am concerned that he has significant neuropathy which is becoming more more progressive and leading to significant weakness and muscle wasting throughout his hands.  Most pronounced is the FDI wasting bilaterally.  Based on the electrodiagnostic studies, there is significant large fiber sensorimotor polyneuropathy, axonal in nature, however there is no definitive evidence of right or left cervical motor radiculopathy.  I did explain that he would be indicated for bilateral, staged carpal tunnel and cubital tunnel release to be performed in order to help alleviate symptoms.  I explained that given the significant changes both clinically and electrodiagnostically, it would be unable to determine preoperatively how much nerve function we would be able to maintain or recover.  I did explain however that given the progressive nature of his symptoms, muscle loss will be the most difficult to recover as time goes along.  He expressed understanding, would like to consider his options and will contact us  moving forward for potential surgical scheduling.  Risks and benefits of the procedure were discussed, risks including but not limited to infection, bleeding, scarring, stiffness, nerve injury, tendon injury, vascular injury, persistent symptoms, recurrence of symptoms and need for subsequent operation.  We also discussed the appropriate postoperative protocol and timeframe for return to activities and function.  Patient expressed understanding.    I spent 45 minutes in the care of this patient today including review of previous documentation, imaging obtained, face-to-face time discussing all options regarding treatment and documenting the encounter.    Follow-up: No follow-ups  on file.   Meds & Orders: No orders of the defined types were placed in this encounter.    Orders Placed This Encounter  Procedures   XR Wrist Complete Left   XR Wrist Complete Right   XR Elbow 2 Views Right   XR Elbow 2 Views Left     Procedures: No procedures performed      Clinical History: No specialty comments available.  He reports that he has never smoked. He has never been exposed to tobacco smoke. He has never used smokeless tobacco.  Recent Labs    01/22/24 1420 03/26/24 0901 07/02/24 0804  HGBA1C 14.1* 11.7* 8.8*    Objective:   Vital Signs: There were no vitals taken for this visit.  Physical Exam  Gen: Well-appearing, in no acute distress; non-toxic CV: Regular Rate. Well-perfused. Warm.  Resp: Breathing unlabored on room air; no wheezing. Psych: Fluid speech in conversation; appropriate affect; normal thought process  Ortho Exam PHYSICAL EXAM:  General: Patient is well appearing and in no distress.   Skin and Muscle: No significant skin changes are apparent to upper extremities.   Range of Motion and Palpation Tests: Mobility is full about the elbows with flexion and extension.  No evidence of nerve subluxation at bilateral elbow.  Forearm supination and pronation are 85/85 bilaterally.  Wrist flexion/extension is 75/65 bilaterally.  Digital flexion and extension are full.  Thumb opposition is full to the base of the small fingers bilaterally.    Neurologic, Vascular, Motor: Sensation is diminished to light touch in the bilateral median and ulnar nerve distribution.    Tinel sign cubital tunnel: Positive bilateral  Sensory bilateral side 2-point discrimination (thumb, index, long, ring, small): Indiscernible  Motor bilateral side APB: 3/5 bilaterally with notable thenar atrophy SF FDP: 3+/5 bilaterally Froment: Positive bilateral FDI wasting: Positive bilateral  Fingers pink and well perfused.  Capillary refill is brisk.     Lab Results  Component Value Date   HGBA1C 8.8 (A) 07/02/2024     Imaging: XR Wrist Complete  Left Result Date: 07/15/2024 There is no evidence of fracture or dislocation. There is no evidence of arthropathy or other focal bone abnormality. Soft tissues are unremarkable.   XR Wrist Complete Right Result Date: 07/15/2024 There is no evidence of fracture or dislocation. There is no evidence of arthropathy or other focal bone abnormality. Soft tissues are unremarkable.   XR Elbow 2 Views Right Result Date: 07/15/2024 There is no evidence of fracture or dislocation. There is no evidence of arthropathy or other focal bone abnormality. Soft tissues are unremarkable.  There is evidence of spurring at the posterior olecranon.  XR Elbow 2 Views Left Result Date: 07/15/2024 There is no evidence of fracture or dislocation. There is no evidence of arthropathy or other focal bone abnormality. Soft tissues are unremarkable.  There is evidence of spurring at the posterior olecranon.    Past Medical/Family/Surgical/Social History: Medications & Allergies reviewed per EMR, new medications updated. Patient Active Problem List   Diagnosis Date Noted   Infected abrasion of right leg 02/20/2024   Abscess of right lower leg 02/18/2024   Cellulitis of right lower leg 02/14/2024   AKI (acute kidney injury) (HCC) 02/14/2024   Type 2 diabetes mellitus with diabetic polyneuropathy, with long-term current use of insulin  (HCC) 06/13/2022   Type 2 diabetes mellitus with hyperglycemia, with long-term current use of insulin  (HCC) 06/13/2022   Insulin  dependent type 2 diabetes mellitus (HCC) 02/11/2020   Glucosuria 02/11/2020  Non-restorative sleep 02/11/2020   Excessive daytime sleepiness 02/11/2020   Sleep related headaches 02/11/2020   Nocturia more than twice per night 02/11/2020   UARS (upper airway resistance syndrome) 04/07/2015   Severe obesity (BMI >= 40) (HCC) 04/07/2015   Primary snoring 04/07/2015   Snoring 12/21/2014   Drowsiness 12/21/2014   Witnessed apneic spells 12/21/2014   ED (erectile  dysfunction) 09/24/2012   Type 2 diabetes mellitus with obesity (HCC) 07/04/2009    Class: Chronic   OBESITY, NOS 01/17/2007   BELLS PALSY 01/17/2007   HYPERTENSION, BENIGN SYSTEMIC 01/17/2007   Past Medical History:  Diagnosis Date   Cataract 2011   surgery on R eye   Diabetes mellitus without complication (HCC)    High cholesterol    Obesity    Family History  Problem Relation Age of Onset   Diabetes Mother    Cancer Father        leukemia    Obesity Other    Past Surgical History:  Procedure Laterality Date   EYE SURGERY Left 2011   cataract   EYE SURGERY Right 2012   INCISION AND DRAINAGE OF DEEP ABSCESS, CALF Right 02/20/2024   Procedure: INCISION AND DRAINAGE OF DEEP ABSCESS, CALF;  Surgeon: [name] Jerona GAILS, MD;  Location: [facility] OR;  Service: Orthopedics;  Laterality: Right;  RIGHT LEG DEBRIDEMENT   INCISION AND DRAINAGE OF WOUND Right 02/22/2024   Procedure: IRRIGATION AND DEBRIDEMENT WOUND;  Surgeon: [name] Jerona GAILS, MD;  Location: [facility] OR;  Service: Orthopedics;  Laterality: Right;  IRRIGATION AND DEBRIDEMENT RIGHT LEG   Social History   Occupational History    Employer: [employer]  Tobacco Use   Smoking status: Never    Passive exposure: Never   Smokeless tobacco: Never  Vaping Use   Vaping status: Never Used  Substance and Sexual Activity   Alcohol use: Yes    Comment: 1 or 2 every 3 months    Drug use: No   Sexual activity: Not on file    Terryn Redner Afton Alderton, M.D. [facility], Hand Surgery

## 2024-07-24 ENCOUNTER — Encounter (INDEPENDENT_AMBULATORY_CARE_PROVIDER_SITE_OTHER): Payer: Self-pay

## 2024-07-25 ENCOUNTER — Ambulatory Visit: Admitting: Family

## 2024-08-07 ENCOUNTER — Ambulatory Visit (INDEPENDENT_AMBULATORY_CARE_PROVIDER_SITE_OTHER): Admitting: Orthopedic Surgery

## 2024-08-07 DIAGNOSIS — L97911 Non-pressure chronic ulcer of unspecified part of right lower leg limited to breakdown of skin: Secondary | ICD-10-CM | POA: Diagnosis not present

## 2024-08-09 ENCOUNTER — Encounter: Payer: Self-pay | Admitting: Orthopedic Surgery

## 2024-08-09 NOTE — Progress Notes (Signed)
 Office Visit Note   Patient: Ian Hughes           Date of Birth: [birth year]           MRN: [id number] Visit Date: 08/07/2024              Requested by: [name], NP [address] [place],  [place] [address] PCP: [name], NP  Chief Complaint  Patient presents with   Right Leg - Follow-up    4/2 and 02/22/24 I&D right leg      HPI: Discussed the use of AI scribe software for clinical note transcription with the patient, who gave verbal consent to proceed.  History of Present Illness Ian Hughes is a 43 year old male who presents with increased swelling in the right lower extremity.  He has experienced increased swelling in his right lower extremity, which has not decreased as rapidly as it did last month. He feels that the swelling has 'kind of got stuck like this'. He attributes some of the swelling to increased activity at work, stating 'I'm up more cause I'm at work now'.  He has been using 15-20 mmHg compression socks for ten years, wearing them during activities such as bicycling and hiking. However, he did not wear them today. He inquires about insurance coverage for compression socks.  He discusses using a vibrating foot device at home, which he believes helps with fluid movement.     Assessment & Plan: Visit Diagnoses:  1. Ulcer of right lower extremity, limited to breakdown of skin (HCC)     Plan: Assessment and Plan Assessment & Plan Chronic right lower extremity lymphedema with venous stasis ulcer Chronic lymphedema with venous stasis ulcer, 1x3 cm, healthy granulation, no infection. Lymphatic and venous systems at 50% capacity, exacerbated by prolonged standing. - Recommend 15-20 mmHg compression socks continuously to reduce swelling. - Transition to daytime use only post-ulcer healing. - Consider 20-30 mmHg compression socks for additional control if needed, noting compliance challenges. - Advise against vibrating foot  devices. - Discuss potential lymphedema pump use, pending insurance approval. - Obtain multiple pairs of compression socks for consistent use. - Instruct to avoid wrinkles in socks to prevent pressure points. - Follow up if swelling does not improve or worsens.      Follow-Up Instructions: No follow-ups on file.   Ortho Exam  Patient is alert, oriented, no adenopathy, well-dressed, normal affect, normal respiratory effort. Physical Exam EXTREMITIES: Increased swelling in the right lower extremity. Calf circumference 45 cm. Pitting edema with skin color changes. SKIN: Flat ulcer 1x3 cm with healthy granulation tissue. No cellulitis or signs of infection.      Imaging: No results found.   Labs: Lab Results  Component Value Date   HGBA1C 8.8 (A) 07/02/2024   HGBA1C 11.7 (A) 03/26/2024   HGBA1C 14.1 (A) 01/22/2024   REPTSTATUS 02/25/2024 FINAL 02/20/2024   GRAMSTAIN  02/20/2024    FEW WBC PRESENT, PREDOMINANTLY PMN NO ORGANISMS SEEN    CULT  02/20/2024    FEW STAPHYLOCOCCUS AUREUS NO ANAEROBES ISOLATED Performed at [facility], [address] [place], [place] [address]    [facility] STAPHYLOCOCCUS AUREUS 02/20/2024     Lab Results  Component Value Date   ALBUMIN 3.3 (L) 02/14/2024   ALBUMIN 4.3 06/21/2020   ALBUMIN 4.3 12/12/2019    Lab Results  Component Value Date   MG 2.0 02/16/2024   Lab Results  Component Value Date  VD25OH 29 (L) 09/08/2014   VD25OH 36 08/21/2013   VD25OH 24 (L) 05/30/2012    No results found for: PREALBUMIN    Latest Ref Rng & Units 02/23/2024    5:19 AM 02/21/2024    6:24 AM 02/20/2024    5:45 AM  CBC EXTENDED  WBC 4.0 - 10.5 K/uL 10.7  11.4  8.1   RBC 4.22 - 5.81 MIL/uL 3.77  4.23  4.37   Hemoglobin 13.0 - 17.0 g/dL 89.8  88.3  88.0   HCT 39.0 - 52.0 % 31.1  34.7  35.3   Platelets 150 - 400 K/uL 370  403  400   NEUT# 1.7 - 7.7 K/uL   5.5   Lymph# 0.7 - 4.0 K/uL   1.4      There is no height or weight on file to  calculate BMI.  Orders:  No orders of the defined types were placed in this encounter.  No orders of the defined types were placed in this encounter.    Procedures: No procedures performed  Clinical Data: No additional findings.  ROS:  All other systems negative, except as noted in the HPI. Review of Systems  Objective: Vital Signs: There were no vitals taken for this visit.  Specialty Comments:  No specialty comments available.  PMFS History: Patient Active Problem List   Diagnosis Date Noted   Infected abrasion of right leg 02/20/2024   Abscess of right lower leg 02/18/2024   Cellulitis of right lower leg 02/14/2024   AKI (acute kidney injury) (HCC) 02/14/2024   Type 2 diabetes mellitus with diabetic polyneuropathy, with long-term current use of insulin  (HCC) 06/13/2022   Type 2 diabetes mellitus with hyperglycemia, with long-term current use of insulin  (HCC) 06/13/2022   Insulin  dependent type 2 diabetes mellitus (HCC) 02/11/2020   Glucosuria 02/11/2020   Non-restorative sleep 02/11/2020   Excessive daytime sleepiness 02/11/2020   Sleep related headaches 02/11/2020   Nocturia more than twice per night 02/11/2020   UARS (upper airway resistance syndrome) 04/07/2015   Severe obesity (BMI >= 40) (HCC) 04/07/2015   Primary snoring 04/07/2015   Snoring 12/21/2014   Drowsiness 12/21/2014   Witnessed apneic spells 12/21/2014   ED (erectile dysfunction) 09/24/2012   Type 2 diabetes mellitus with obesity (HCC) 07/04/2009    Class: Chronic   OBESITY, NOS 01/17/2007   BELLS PALSY 01/17/2007   HYPERTENSION, BENIGN SYSTEMIC 01/17/2007   Past Medical History:  Diagnosis Date   Cataract 2011   surgery on R eye   Diabetes mellitus without complication (HCC)    High cholesterol    Obesity     Family History  Problem Relation Age of Onset   Diabetes Mother    Cancer Father        leukemia    Obesity Other     Past Surgical History:  Procedure Laterality Date    EYE SURGERY Left 2011   cataract   EYE SURGERY Right 2012   INCISION AND DRAINAGE OF DEEP ABSCESS, CALF Right 02/20/2024   Procedure: INCISION AND DRAINAGE OF DEEP ABSCESS, CALF;  Surgeon: [name] Jerona GAILS, MD;  Location: [facility] OR;  Service: Orthopedics;  Laterality: Right;  RIGHT LEG DEBRIDEMENT   INCISION AND DRAINAGE OF WOUND Right 02/22/2024   Procedure: IRRIGATION AND DEBRIDEMENT WOUND;  Surgeon: [name] Jerona GAILS, MD;  Location: [facility] OR;  Service: Orthopedics;  Laterality: Right;  IRRIGATION AND DEBRIDEMENT RIGHT LEG   Social History   Occupational History    Employer: [employer]  Tobacco Use   Smoking status: Never    Passive exposure: Never   Smokeless tobacco: Never  Vaping Use   Vaping status: Never Used  Substance and Sexual Activity   Alcohol use: Yes    Comment: 1 or 2 every 3 months    Drug use: No   Sexual activity: Not on file

## 2024-08-18 LAB — OPHTHALMOLOGY REPORT-SCANNED

## 2024-08-19 ENCOUNTER — Ambulatory Visit: Payer: Self-pay | Admitting: Family

## 2024-08-20 ENCOUNTER — Other Ambulatory Visit: Payer: Self-pay | Admitting: Family

## 2024-08-20 DIAGNOSIS — I10 Essential (primary) hypertension: Secondary | ICD-10-CM

## 2024-08-20 DIAGNOSIS — G629 Polyneuropathy, unspecified: Secondary | ICD-10-CM

## 2024-09-02 ENCOUNTER — Ambulatory Visit: Admitting: Pharmacist

## 2024-09-10 ENCOUNTER — Ambulatory Visit (INDEPENDENT_AMBULATORY_CARE_PROVIDER_SITE_OTHER): Admitting: Family

## 2024-09-10 VITALS — BP 183/108 | HR 84 | Temp 98.0°F | Resp 16 | Ht 70.0 in | Wt 264.2 lb

## 2024-09-10 DIAGNOSIS — Z7984 Long term (current) use of oral hypoglycemic drugs: Secondary | ICD-10-CM | POA: Diagnosis not present

## 2024-09-10 DIAGNOSIS — E785 Hyperlipidemia, unspecified: Secondary | ICD-10-CM

## 2024-09-10 DIAGNOSIS — Z794 Long term (current) use of insulin: Secondary | ICD-10-CM

## 2024-09-10 DIAGNOSIS — Z23 Encounter for immunization: Secondary | ICD-10-CM

## 2024-09-10 DIAGNOSIS — I10 Essential (primary) hypertension: Secondary | ICD-10-CM | POA: Diagnosis not present

## 2024-09-10 DIAGNOSIS — E1165 Type 2 diabetes mellitus with hyperglycemia: Secondary | ICD-10-CM

## 2024-09-10 DIAGNOSIS — E119 Type 2 diabetes mellitus without complications: Secondary | ICD-10-CM

## 2024-09-10 DIAGNOSIS — Z7985 Long-term (current) use of injectable non-insulin antidiabetic drugs: Secondary | ICD-10-CM

## 2024-09-10 MED ORDER — LOSARTAN POTASSIUM 100 MG PO TABS: 100.0000 mg | ORAL_TABLET | Freq: Every day | ORAL | 0 refills | Status: DC

## 2024-09-10 MED ORDER — BASAGLAR KWIKPEN 100 UNIT/ML ~~LOC~~ SOPN: 24.0000 [IU] | PEN_INJECTOR | Freq: Every day | SUBCUTANEOUS | 2 refills | Status: DC

## 2024-09-10 MED ORDER — AMLODIPINE BESYLATE 5 MG PO TABS: 5.0000 mg | ORAL_TABLET | Freq: Every day | ORAL | 0 refills | Status: DC

## 2024-09-10 MED ORDER — OZEMPIC (0.25 OR 0.5 MG/DOSE) 2 MG/3ML ~~LOC~~ SOPN: 0.2500 mg | PEN_INJECTOR | SUBCUTANEOUS | 2 refills | Status: DC

## 2024-09-10 MED ORDER — GLIMEPIRIDE 2 MG PO TABS: 2.0000 mg | ORAL_TABLET | Freq: Every day | ORAL | 0 refills | Status: DC

## 2024-09-10 MED ORDER — HYDROCHLOROTHIAZIDE 12.5 MG PO TABS: 12.5000 mg | ORAL_TABLET | Freq: Every day | ORAL | 0 refills | Status: DC

## 2024-09-10 MED ORDER — INSULIN PEN NEEDLE 32G X 4 MM MISC: 1.0000 | Freq: Every day | 12 refills | Status: AC

## 2024-09-10 MED ORDER — HYDRALAZINE HCL 10 MG PO TABS
50.0000 mg | ORAL_TABLET | Freq: Once | ORAL | Status: AC
  Administered 2024-09-10: 50 mg via ORAL

## 2024-09-10 MED ORDER — ATORVASTATIN CALCIUM 10 MG PO TABS: 10.0000 mg | ORAL_TABLET | Freq: Every day | ORAL | 0 refills | Status: DC

## 2024-09-10 NOTE — Progress Notes (Signed)
 Patient ID: Ian Hughes, male    DOB: [birth year]  MRN: [id number]  CC: Chronic Conditions Follow-Up  Subjective: Ian Hughes is a 43 y.o. male who presents for chronic conditions follow-up.   His concerns today include:  - Patient last seen on 04/08/2024 at [facility] [employer] for hypertension and instructed to follow-up as needed. Patient doing well on Amlodipine , Losartan , and Hydrochlorothiazide , no issues/concerns. He does not check blood pressure outside of office. He does not complain of red flag symptoms such as but not limited to chest pain, shortness of breath, worst headache of life, nausea/vomiting.  - Patient seen on 07/02/2024 at [facility] for type 2 diabetes mellitus with diabetic polyneuropathy with long-term current use of insulin  and additional diagnosis. During the same office visit patient was referred back to Primary Care, clinical pharmacist, and [facility]. Doing well on Glimepiride , Insulin  Glargine, and Semaglutide . Denies red flag symptoms associated with diabetes.  - Due for diabetic eye exam. - Doing well on Atorvastatin , no issues/concerns.   Patient Active Problem List   Diagnosis Date Noted   Infected abrasion of right leg 02/20/2024   Abscess of right lower leg 02/18/2024   Cellulitis of right lower leg 02/14/2024   AKI (acute kidney injury) 02/14/2024   Type 2 diabetes mellitus with diabetic polyneuropathy, with long-term current use of insulin  (HCC) 06/13/2022   Type 2 diabetes mellitus with hyperglycemia, with long-term current use of insulin  (HCC) 06/13/2022   Insulin  dependent type 2 diabetes mellitus (HCC) 02/11/2020   Glucosuria 02/11/2020   Non-restorative sleep 02/11/2020   Excessive daytime sleepiness 02/11/2020   Sleep related headaches 02/11/2020   Nocturia more than twice per night 02/11/2020   UARS (upper airway resistance syndrome) 04/07/2015   Severe obesity  (BMI >= 40) (HCC) 04/07/2015   Primary snoring 04/07/2015   Snoring 12/21/2014   Drowsiness 12/21/2014   Witnessed apneic spells 12/21/2014   ED (erectile dysfunction) 09/24/2012   Type 2 diabetes mellitus with obesity 07/04/2009    Class: Chronic   OBESITY, NOS 01/17/2007   BELLS PALSY 01/17/2007   HYPERTENSION, BENIGN SYSTEMIC 01/17/2007     Current Outpatient Medications on File Prior to Visit  Medication Sig Dispense Refill   acetaminophen  (TYLENOL ) 500 MG tablet Take 500 mg by mouth every 6 (six) hours as needed.     Continuous Glucose Sensor (FREESTYLE LIBRE 3 PLUS SENSOR) MISC Use to check blood sugar continuously throughout the day. Change sensors once every 14 days. E11.42 6 each 3   gabapentin  (NEURONTIN ) 300 MG capsule TAKE 1 CAPSULE(300 MG) BY MOUTH AT BEDTIME 90 capsule 0   TRUEplus Lancets 28G MISC Use to check blood sugar 2 times a day 100 each 4   DULoxetine  (CYMBALTA ) 20 MG capsule 1 capsule. (Patient not taking: Reported on 07/02/2024)     isosorbide mononitrate (IMDUR) 60 MG 24 hr tablet Take 60 mg by mouth daily. (Patient not taking: Reported on 07/02/2024)     [DISCONTINUED] bromocriptine  (PARLODEL ) 2.5 MG tablet 1/4 tab daily (Patient not taking: Reported on 03/12/2019) 25 tablet 3   No current facility-administered medications on file prior to visit.    No Known Allergies  Social History   Socioeconomic History   Marital status: Divorced    Spouse name: Not on file   Number of children: 1   Years of education: 12+   Highest education level: Some college, no degree  Occupational History    Employer: [employer]  Tobacco Use   Smoking status: Never    Passive exposure: Never   Smokeless tobacco: Never  Vaping Use   Vaping status: Never Used  Substance and Sexual Activity   Alcohol use: Yes    Comment: 1 or 2 every 3 months    Drug use: No   Sexual activity: Not Currently  Other Topics Concern   Not on file  Social History Narrative   Lives at home  with wife and stepson.   Caffeine use: none   Has one child.   Social Drivers of Health   Financial Resource Strain: Medium Risk (09/10/2024)   Overall Financial Resource Strain (CARDIA)    Difficulty of Paying Living Expenses: Somewhat hard  Food Insecurity: No Food Insecurity (09/10/2024)   Hunger Vital Sign    Worried About Running Out of Food in the Last Year: Never true    Ran Out of Food in the Last Year: Never true  Transportation Needs: No Transportation Needs (09/10/2024)   PRAPARE - Administrator, Civil Service (Medical): No    Lack of Transportation (Non-Medical): No  Physical Activity: Sufficiently Active (09/10/2024)   Exercise Vital Sign    Days of Exercise per Week: 7 days    Minutes of Exercise per Session: 40 min  Stress: No Stress Concern Present (09/10/2024)   [employer] of Occupational Health - Occupational Stress Questionnaire    Feeling of Stress: Not at all  Social Connections: Moderately Isolated (09/10/2024)   Social Connection and Isolation Panel    Frequency of Communication with Friends and Family: Three times a week    Frequency of Social Gatherings with Friends and Family: Once a week    Attends Religious Services: 1 to 4 times per year    Active Member of [employer] or Organizations: No    Attends Engineer, structural: Not on file    Marital Status: Separated  Intimate Partner Violence: Not At Risk (04/16/2024)   Humiliation, Afraid, Rape, and Kick questionnaire    Fear of Current or Ex-Partner: No    Emotionally Abused: No    Physically Abused: No    Sexually Abused: No    Family History  Problem Relation Age of Onset   Diabetes Mother    Cancer Father        leukemia    Obesity Other     Past Surgical History:  Procedure Laterality Date   EYE SURGERY Left 2011   cataract   EYE SURGERY Right 2012   INCISION AND DRAINAGE OF DEEP ABSCESS, CALF Right 02/20/2024   Procedure: INCISION AND DRAINAGE OF DEEP ABSCESS,  CALF;  Surgeon: [name] Jerona GAILS, MD;  Location: [facility] OR;  Service: Orthopedics;  Laterality: Right;  RIGHT LEG DEBRIDEMENT   INCISION AND DRAINAGE OF WOUND Right 02/22/2024   Procedure: IRRIGATION AND DEBRIDEMENT WOUND;  Surgeon: [name] Jerona GAILS, MD;  Location: [facility] OR;  Service: Orthopedics;  Laterality: Right;  IRRIGATION AND DEBRIDEMENT RIGHT LEG    ROS: Review of Systems Negative except as stated above  PHYSICAL EXAM: BP (!) 183/108   Pulse 84   Temp 98 F (36.7 C) (Oral)   Resp 16   Ht 5' 10 (1.778 m)   Wt 264 lb 3.2 oz (119.8 kg)   SpO2 98%   BMI 37.91 kg/m      09/10/2024   10:14 AM 09/10/2024    9:22 AM 09/10/2024    9:08  AM  Vitals with BMI  Height   5' 10  Weight   264 lbs 3 oz  BMI   37.91  Systolic 183 171 811  Diastolic 108 100 891  Pulse   84    Physical Exam HENT:     Head: Normocephalic and atraumatic.     Nose: Nose normal.     Mouth/Throat:     Mouth: Mucous membranes are moist.     Pharynx: Oropharynx is clear.  Eyes:     Extraocular Movements: Extraocular movements intact.     Conjunctiva/sclera: Conjunctivae normal.     Pupils: Pupils are equal, round, and reactive to light.  Cardiovascular:     Rate and Rhythm: Normal rate and regular rhythm.     Pulses: Normal pulses.     Heart sounds: Normal heart sounds.  Pulmonary:     Effort: Pulmonary effort is normal.     Breath sounds: Normal breath sounds.  Musculoskeletal:        General: Normal range of motion.     Cervical back: Normal range of motion and neck supple.  Neurological:     General: No focal deficit present.     Mental Status: He is alert and oriented to person, place, and time.  Psychiatric:        Mood and Affect: Mood normal.        Behavior: Behavior normal.     ASSESSMENT AND PLAN: 1. Primary hypertension (Primary) - Blood pressure not at goal during today's visit. Patient asymptomatic without chest pressure, chest pain, palpitations, shortness of breath, worst headache of  life, and any additional red flag symptoms. - Hydralazine administered in office without improvement of blood pressure.  - Continue Amlodipine  and Hydrochlorothiazide  as prescribed. - Increase Losartan  from 50 mg to 100 mg as prescribed.  - Routine screening.  - Counseled on blood pressure goal of less than 130/80, low-sodium, DASH diet, medication compliance, and 150 minutes of moderate intensity exercise per week as tolerated. Counseled on medication adherence and adverse effects. - Keep all scheduled appointments with established Cardiology.  - Follow-up with primary provider in 2 weeks or sooner if needed.  - amLODipine  (NORVASC ) 5 MG tablet; Take 1 tablet (5 mg total) by mouth daily.  Dispense: 90 tablet; Refill: 0 - hydrochlorothiazide  (HYDRODIURIL ) 12.5 MG tablet; Take 1 tablet (12.5 mg total) by mouth daily.  Dispense: 90 tablet; Refill: 0 - losartan  (COZAAR ) 100 MG tablet; Take 1 tablet (100 mg total) by mouth daily.  Dispense: 90 tablet; Refill: 0 - hydrALAZINE (APRESOLINE) tablet 50 mg - Basic Metabolic Panel  2. Type 2 diabetes mellitus with hyperglycemia, with long-term current use of insulin  (HCC) - Hemoglobin A1c result pending.  - Continue Glimepiride , Insulin  Glargine, and Semaglutide  as prescribed. - Discussed the importance of healthy eating habits, low-carbohydrate diet, low-sugar diet, regular aerobic exercise (at least 150 minutes a week as tolerated) and medication compliance to achieve or maintain control of diabetes. Counseled on medication adherence/adverse effects.  - Follow-up with primary provider as scheduled.  - glimepiride  (AMARYL ) 2 MG tablet; Take 1 tablet (2 mg total) by mouth daily before breakfast.  Dispense: 90 tablet; Refill: 0 - Insulin  Glargine (BASAGLAR  KWIKPEN) 100 UNIT/ML; Inject 24 Units into the skin at bedtime.  Dispense: 15 mL; Refill: 2 - Insulin  Pen Needle 32G X 4 MM MISC; 1 each by Other route daily. Use to inject insulin  once daily.  Dispense:  100 each; Refill: 12 - Semaglutide ,0.25 or 0.5MG /DOS, (OZEMPIC , 0.25  OR 0.5 MG/DOSE,) 2 MG/3ML SOPN; Inject 0.25 mg into the skin once a week.  Dispense: 15 mL; Refill: 2 - Hemoglobin A1c  3. Diabetic eye exam [facility]) - Referral to Ophthalmology for evaluation/management.  - Ambulatory referral to Ophthalmology  4. Hyperlipidemia, unspecified hyperlipidemia type - Continue Atorvastatin  as prescribed. Counseled on medication adherence/adverse effects.  - Routine screening.  - Follow-up with primary provider as scheduled.  - atorvastatin  (LIPITOR) 10 MG tablet; Take 1 tablet (10 mg total) by mouth daily.  Dispense: 90 tablet; Refill: 0 - Lipid panel  5. Immunization due - Administered.  - Flu vaccine trivalent PF, 6mos and older(Flulaval,Afluria,Fluarix,Fluzone) - Pneumococcal conjugate vaccine 20-valent (Prevnar 20)   Patient was given the opportunity to ask questions.  Patient verbalized understanding of the plan and was able to repeat key elements of the plan. Patient was given clear instructions to go to Emergency Department or return to medical center if symptoms don't improve, worsen, or new problems develop.The patient verbalized understanding.   Orders Placed This Encounter  Procedures   Flu vaccine trivalent PF, 6mos and older(Flulaval,Afluria,Fluarix,Fluzone)   Pneumococcal conjugate vaccine 20-valent (Prevnar 20)   Lipid panel   Hemoglobin A1c   Basic Metabolic Panel   Ambulatory referral to Ophthalmology     Requested Prescriptions   Signed Prescriptions Disp Refills   amLODipine  (NORVASC ) 5 MG tablet 90 tablet 0    Sig: Take 1 tablet (5 mg total) by mouth daily.   atorvastatin  (LIPITOR) 10 MG tablet 90 tablet 0    Sig: Take 1 tablet (10 mg total) by mouth daily.   glimepiride  (AMARYL ) 2 MG tablet 90 tablet 0    Sig: Take 1 tablet (2 mg total) by mouth daily before breakfast.   hydrochlorothiazide  (HYDRODIURIL ) 12.5 MG tablet 90 tablet 0    Sig: Take 1 tablet  (12.5 mg total) by mouth daily.   Insulin  Glargine (BASAGLAR  KWIKPEN) 100 UNIT/ML 15 mL 2    Sig: Inject 24 Units into the skin at bedtime.   Insulin  Pen Needle 32G X 4 MM MISC 100 each 12    Sig: 1 each by Other route daily. Use to inject insulin  once daily.   losartan  (COZAAR ) 100 MG tablet 90 tablet 0    Sig: Take 1 tablet (100 mg total) by mouth daily.   Semaglutide ,0.25 or 0.5MG /DOS, (OZEMPIC , 0.25 OR 0.5 MG/DOSE,) 2 MG/3ML SOPN 15 mL 2    Sig: Inject 0.25 mg into the skin once a week.    Return in about 2 weeks (around 09/24/2024) for Follow-Up or next available chronic conditions.  Greig JINNY Drones, NP

## 2024-09-11 ENCOUNTER — Ambulatory Visit: Payer: Self-pay | Admitting: Family

## 2024-09-11 DIAGNOSIS — E1165 Type 2 diabetes mellitus with hyperglycemia: Secondary | ICD-10-CM

## 2024-09-11 LAB — BASIC METABOLIC PANEL WITH GFR
BUN/Creatinine Ratio: 12 (ref 9–20)
BUN: 12 mg/dL (ref 6–24)
CO2: 23 mmol/L (ref 20–29)
Calcium: 9.3 mg/dL (ref 8.7–10.2)
Chloride: 101 mmol/L (ref 96–106)
Creatinine, Ser: 1.04 mg/dL (ref 0.76–1.27)
Glucose: 314 mg/dL — ABNORMAL HIGH (ref 70–99)
Potassium: 5 mmol/L (ref 3.5–5.2)
Sodium: 138 mmol/L (ref 134–144)
eGFR: 91 mL/min/1.73 (ref >59)

## 2024-09-11 LAB — LIPID PANEL
Chol/HDL Ratio: 3.6 ratio (ref 0.0–5.0)
Cholesterol, Total: 132 mg/dL (ref 100–199)
HDL: 37 mg/dL — ABNORMAL LOW (ref >39)
LDL Chol Calc (NIH): 75 mg/dL (ref 0–99)
Triglycerides: 110 mg/dL (ref 0–149)
VLDL Cholesterol Cal: 20 mg/dL (ref 5–40)

## 2024-09-11 LAB — HEMOGLOBIN A1C
Est. average glucose Bld gHb Est-mCnc: 223 mg/dL
Hgb A1c MFr Bld: 9.4 % — ABNORMAL HIGH (ref 4.8–5.6)

## 2024-09-11 MED ORDER — BASAGLAR KWIKPEN 100 UNIT/ML ~~LOC~~ SOPN: 29.0000 [IU] | PEN_INJECTOR | Freq: Every day | SUBCUTANEOUS | 2 refills | Status: DC

## 2024-09-15 ENCOUNTER — Ambulatory Visit: Admitting: Skilled Nursing Facility1

## 2024-09-18 ENCOUNTER — Other Ambulatory Visit: Payer: Self-pay

## 2024-09-22 ENCOUNTER — Encounter: Payer: Self-pay | Admitting: Radiology

## 2024-10-01 ENCOUNTER — Ambulatory Visit: Admitting: Family

## 2024-10-22 ENCOUNTER — Ambulatory Visit: Admitting: Family

## 2024-11-11 ENCOUNTER — Encounter: Payer: Self-pay | Admitting: Family

## 2024-11-11 ENCOUNTER — Ambulatory Visit (INDEPENDENT_AMBULATORY_CARE_PROVIDER_SITE_OTHER): Admitting: Family

## 2024-11-11 VITALS — BP 152/93 | HR 81 | Temp 98.6°F | Resp 16 | Ht 70.0 in | Wt 284.6 lb

## 2024-11-11 DIAGNOSIS — I10 Essential (primary) hypertension: Secondary | ICD-10-CM

## 2024-11-11 DIAGNOSIS — E785 Hyperlipidemia, unspecified: Secondary | ICD-10-CM | POA: Diagnosis not present

## 2024-11-11 DIAGNOSIS — R600 Localized edema: Secondary | ICD-10-CM

## 2024-11-11 DIAGNOSIS — E1165 Type 2 diabetes mellitus with hyperglycemia: Secondary | ICD-10-CM | POA: Diagnosis not present

## 2024-11-11 DIAGNOSIS — Z794 Long term (current) use of insulin: Secondary | ICD-10-CM

## 2024-11-11 MED ORDER — LOSARTAN POTASSIUM 100 MG PO TABS: 100.0000 mg | ORAL_TABLET | Freq: Every day | ORAL | 0 refills | Status: AC

## 2024-11-11 MED ORDER — AMLODIPINE BESYLATE 5 MG PO TABS: 5.0000 mg | ORAL_TABLET | Freq: Every day | ORAL | 0 refills | Status: AC

## 2024-11-11 MED ORDER — ATORVASTATIN CALCIUM 10 MG PO TABS: 10.0000 mg | ORAL_TABLET | Freq: Every day | ORAL | 0 refills | Status: AC

## 2024-11-11 MED ORDER — OZEMPIC (0.25 OR 0.5 MG/DOSE) 2 MG/3ML ~~LOC~~ SOPN: 0.2500 mg | PEN_INJECTOR | SUBCUTANEOUS | 0 refills | Status: AC

## 2024-11-11 MED ORDER — HYDROCHLOROTHIAZIDE 25 MG PO TABS: 25.0000 mg | ORAL_TABLET | Freq: Every day | ORAL | 0 refills | Status: AC

## 2024-11-11 MED ORDER — MUPIROCIN 2 % EX OINT: 1.0000 | TOPICAL_OINTMENT | Freq: Two times a day (BID) | CUTANEOUS | 2 refills | Status: AC

## 2024-11-11 MED ORDER — BASAGLAR KWIKPEN 100 UNIT/ML ~~LOC~~ SOPN: 29.0000 [IU] | PEN_INJECTOR | Freq: Every day | SUBCUTANEOUS | 2 refills | Status: AC

## 2024-11-11 MED ORDER — AMOXICILLIN-POT CLAVULANATE 875-125 MG PO TABS: 1.0000 | ORAL_TABLET | Freq: Two times a day (BID) | ORAL | 0 refills | Status: AC

## 2024-11-11 MED ORDER — GLIMEPIRIDE 2 MG PO TABS: 2.0000 mg | ORAL_TABLET | Freq: Every day | ORAL | 0 refills | Status: AC

## 2024-11-11 NOTE — Progress Notes (Signed)
 "   Patient ID: Ian Hughes, male    DOB: Jun 08, 1981  MRN: [id number]  CC: Chronic Conditions Follow-Up  Subjective: Ian Hughes is a 43 y.o. male who presents for chronic conditions follow-up. He is accompanied by his mother.  His concerns today include:  - Doing well on Amlodipine , Hydrochlorothiazide , and Losartan , no issues/concerns. He does not watch what he eats. He does not complain of red flag symptoms such as but not limited to chest pain, shortness of breath, worst headache of life, nausea/vomiting. Patient last seen by Cardiology on 04/08/2024. - Doing well on Glimepiride , Insulin  Glargine, and Semaglutide , no issues/concerns. Home blood sugars 240's. He does not watch what he eats. Denies red flag symptoms associated with diabetes.  - Doing well on Atorvastatin , no issues/concerns.  - Bilateral leg edema with intermittent discomfort. Denies red flag symptoms. Reports drainage of bilateral legs. Patient last seen by Orthopedics on 08/07/2024.  Patient Active Problem List   Diagnosis Date Noted   Infected abrasion of right leg 02/20/2024   Abscess of right lower leg 02/18/2024   Cellulitis of right lower leg 02/14/2024   AKI (acute kidney injury) 02/14/2024   Type 2 diabetes mellitus with diabetic polyneuropathy, with long-term current use of insulin  (HCC) 06/13/2022   Type 2 diabetes mellitus with hyperglycemia, with long-term current use of insulin  (HCC) 06/13/2022   Insulin  dependent type 2 diabetes mellitus (HCC) 02/11/2020   Glucosuria 02/11/2020   Non-restorative sleep 02/11/2020   Excessive daytime sleepiness 02/11/2020   Sleep related headaches 02/11/2020   Nocturia more than twice per night 02/11/2020   UARS (upper airway resistance syndrome) 04/07/2015   Severe obesity (BMI >= 40) (HCC) 04/07/2015   Primary snoring 04/07/2015   Snoring 12/21/2014   Drowsiness 12/21/2014   Witnessed apneic spells 12/21/2014   ED (erectile dysfunction) 09/24/2012   Type 2 diabetes  mellitus with obesity 07/04/2009    Class: Chronic   OBESITY, NOS 01/17/2007   BELLS PALSY 01/17/2007   HYPERTENSION, BENIGN SYSTEMIC 01/17/2007     Medications Ordered Prior to Encounter[1]  Allergies[2]  Social History   Socioeconomic History   Marital status: Divorced    Spouse name: Not on file   Number of children: 1   Years of education: 12+   Highest education level: Some college, no degree  Occupational History    Employer: [employer]  Tobacco Use   Smoking status: Never    Passive exposure: Never   Smokeless tobacco: Never  Vaping Use   Vaping status: Never Used  Substance and Sexual Activity   Alcohol use: Yes    Comment: 1 or 2 every 3 months    Drug use: No   Sexual activity: Not Currently  Other Topics Concern   Not on file  Social History Narrative   Lives at home with wife and stepson.   Caffeine use: none   Has one child.   Social Drivers of Health   Tobacco Use: Low Risk (11/11/2024)   Patient History    Smoking Tobacco Use: Never    Smokeless Tobacco Use: Never    Passive Exposure: Never  Financial Resource Strain: Medium Risk (09/10/2024)   Overall Financial Resource Strain (CARDIA)    Difficulty of Paying Living Expenses: Somewhat hard  Food Insecurity: No Food Insecurity (09/10/2024)   Epic    Worried About Programme Researcher, Broadcasting/film/video in the Last Year: Never true    Ran Out of Food in the Last Year: Never true  Transportation Needs:  No Transportation Needs (09/10/2024)   Epic    Lack of Transportation (Medical): No    Lack of Transportation (Non-Medical): No  Physical Activity: Sufficiently Active (09/10/2024)   Exercise Vital Sign    Days of Exercise per Week: 7 days    Minutes of Exercise per Session: 40 min  Stress: No Stress Concern Present (09/10/2024)   [employer] of Occupational Health - Occupational Stress Questionnaire    Feeling of Stress: Not at all  Social Connections: Moderately Isolated (09/10/2024)   Social Connection  and Isolation Panel    Frequency of Communication with Friends and Family: Three times a week    Frequency of Social Gatherings with Friends and Family: Once a week    Attends Religious Services: 1 to 4 times per year    Active Member of [employer] or Organizations: No    Attends Engineer, Structural: Not on file    Marital Status: Separated  Intimate Partner Violence: Not At Risk (04/16/2024)   Humiliation, Afraid, Rape, and Kick questionnaire    Fear of Current or Ex-Partner: No    Emotionally Abused: No    Physically Abused: No    Sexually Abused: No  Depression (PHQ2-9): Low Risk (11/11/2024)   Depression (PHQ2-9)    PHQ-2 Score: 0  Alcohol Screen: Low Risk (09/10/2024)   Alcohol Screen    Last Alcohol Screening Score (AUDIT): 1  Housing: High Risk (09/10/2024)   Epic    Unable to Pay for Housing in the Last Year: Yes    Number of Times Moved in the Last Year: 1    Homeless in the Last Year: No  Utilities: Not At Risk (04/16/2024)   [facility] Utilities    Threatened with loss of utilities: No  Health Literacy: Adequate Health Literacy (02/27/2024)   B1300 Health Literacy    Frequency of need for help with medical instructions: Never    Family History  Problem Relation Age of Onset   Diabetes Mother    Cancer Father        leukemia    Obesity Other     Past Surgical History:  Procedure Laterality Date   EYE SURGERY Left 2011   cataract   EYE SURGERY Right 2012   INCISION AND DRAINAGE OF DEEP ABSCESS, CALF Right 02/20/2024   Procedure: INCISION AND DRAINAGE OF DEEP ABSCESS, CALF;  Surgeon: [name] Jerona GAILS, MD;  Location: [facility] OR;  Service: Orthopedics;  Laterality: Right;  RIGHT LEG DEBRIDEMENT   INCISION AND DRAINAGE OF WOUND Right 02/22/2024   Procedure: IRRIGATION AND DEBRIDEMENT WOUND;  Surgeon: [name] Jerona GAILS, MD;  Location[facility];  Service: Orthopedics;  Laterality: Right;  IRRIGATION AND DEBRIDEMENT RIGHT LEG    ROS: Review of Systems Negative except as stated  above  PHYSICAL EXAM: BP (!) 152/93   Pulse 81   Temp 98.6 F (37 C) (Oral)   Resp 16   Ht 5' 10 (1.778 m)   Wt 284 lb 9.6 oz (129.1 kg)   SpO2 97%   BMI 40.84 kg/m   Physical Exam HENT:     Head: Normocephalic and atraumatic.     Nose: Nose normal.     Mouth/Throat:     Mouth: Mucous membranes are moist.     Pharynx: Oropharynx is clear.  Eyes:     Extraocular Movements: Extraocular movements intact.     Conjunctiva/sclera: Conjunctivae normal.     Pupils: Pupils are equal, round, and reactive to light.  Cardiovascular:  Rate and Rhythm: Normal rate and regular rhythm.     Pulses: Normal pulses.     Heart sounds: Normal heart sounds.  Pulmonary:     Effort: Pulmonary effort is normal.     Breath sounds: Normal breath sounds.  Musculoskeletal:        General: Normal range of motion.     Cervical back: Normal range of motion and neck supple.     Right hip: Normal.     Left hip: Normal.     Right upper leg: Normal.     Left upper leg: Normal.     Right knee: Normal.     Left knee: Normal.     Right lower leg: Swelling present.     Left lower leg: Swelling present.     Right ankle: Normal.     Left ankle: Normal.     Right foot: Normal.     Left foot: Normal.     Comments: +1 edema bilateral lower extremities with minimal drainage.  Neurological:     General: No focal deficit present.     Mental Status: He is alert and oriented to person, place, and time.  Psychiatric:        Mood and Affect: Mood normal.        Behavior: Behavior normal.    ASSESSMENT AND PLAN: 1. Primary hypertension (Primary) - Blood pressure not at goal during today's visit. Patient asymptomatic without chest pressure, chest pain, palpitations, shortness of breath, worst headache of life, and any additional red flag symptoms. - Continue Amlodipine  and Losartan  as prescribed.  - Increase Hydrochlorothiazide  from 12.5 mg to 25 mg as prescribed.  - Routine screening.  - Counseled on  blood pressure goal of less than 130/80, low-sodium, DASH diet, medication compliance, and 150 minutes of moderate intensity exercise per week as tolerated. Counseled on medication adherence and adverse effects. - Referral to Cardiology for evaluation/management.  - Follow-up with primary provider in 4 weeks or sooner if needed.  - amLODipine  (NORVASC ) 5 MG tablet; Take 1 tablet (5 mg total) by mouth daily.  Dispense: 90 tablet; Refill: 0 - hydrochlorothiazide  (HYDRODIURIL ) 25 MG tablet; Take 1 tablet (25 mg total) by mouth daily.  Dispense: 90 tablet; Refill: 0 - losartan  (COZAAR ) 100 MG tablet; Take 1 tablet (100 mg total) by mouth daily.  Dispense: 90 tablet; Refill: 0 - Basic Metabolic Panel - Ambulatory referral to Cardiology  2. Type 2 diabetes mellitus with hyperglycemia, with long-term current use of insulin  (HCC) - Hemoglobin A1c result pending.  - Continue Glimepiride , Insulin  Glargine, and Semaglutide  as prescribed.  - Discussed the importance of healthy eating habits, low-carbohydrate diet, low-sugar diet, regular aerobic exercise (at least 150 minutes a week as tolerated) and medication compliance to achieve or maintain control of diabetes. Counseled on medication adherence/adverse effects.  - Follow-up with primary provider as scheduled.  - glimepiride  (AMARYL ) 2 MG tablet; Take 1 tablet (2 mg total) by mouth daily before breakfast.  Dispense: 90 tablet; Refill: 0 - Insulin  Glargine (BASAGLAR  KWIKPEN) 100 UNIT/ML; Inject 29 Units into the skin at bedtime.  Dispense: 15 mL; Refill: 2 - Semaglutide ,0.25 or 0.5MG /DOS, (OZEMPIC , 0.25 OR 0.5 MG/DOSE,) 2 MG/3ML SOPN; Inject 0.25 mg into the skin once a week.  Dispense: 15 mL; Refill: 0 - Hemoglobin A1c  3. Hyperlipidemia, unspecified hyperlipidemia type - Continue Atorvastatin  as prescribed. Counseled on medication adherence/adverse effects.  - Follow-up with primary provider in 3 months or sooner if needed.  - atorvastatin  (LIPITOR)  10  MG tablet; Take 1 tablet (10 mg total) by mouth daily.  Dispense: 90 tablet; Refill: 0  4. Leg edema - Mupirocin  and Amoxicillin -Clavulanate as prescribed. Counseled on medication adherence/adverse effects.  - Hydrochlorothiazide  as prescribed (see #1). - VAS US  Lower Extremity Venous for evaluation.  - Schedule follow-up appointment with established Orthopedics.  - Follow-up with primary provider as scheduled.  - VAS US  LOWER EXTREMITY VENOUS (DVT); Future - mupirocin  ointment (BACTROBAN ) 2 %; Apply 1 Application topically 2 (two) times daily.  Dispense: 30 g; Refill: 2 - amoxicillin -clavulanate (AUGMENTIN ) 875-125 MG tablet; Take 1 tablet by mouth 2 (two) times daily.  Dispense: 20 tablet; Refill: 0    Patient was given the opportunity to ask questions.  Patient verbalized understanding of the plan and was able to repeat key elements of the plan. Patient was given clear instructions to go to Emergency Department or return to medical center if symptoms don't improve, worsen, or new problems develop.The patient verbalized understanding.   Orders Placed This Encounter  Procedures   Basic Metabolic Panel   Hemoglobin A1c   Ambulatory referral to Cardiology   VAS US  LOWER EXTREMITY VENOUS (DVT)     Requested Prescriptions   Signed Prescriptions Disp Refills   amLODipine  (NORVASC ) 5 MG tablet 90 tablet 0    Sig: Take 1 tablet (5 mg total) by mouth daily.   hydrochlorothiazide  (HYDRODIURIL ) 25 MG tablet 90 tablet 0    Sig: Take 1 tablet (25 mg total) by mouth daily.   losartan  (COZAAR ) 100 MG tablet 90 tablet 0    Sig: Take 1 tablet (100 mg total) by mouth daily.   glimepiride  (AMARYL ) 2 MG tablet 90 tablet 0    Sig: Take 1 tablet (2 mg total) by mouth daily before breakfast.   Insulin  Glargine (BASAGLAR  KWIKPEN) 100 UNIT/ML 15 mL 2    Sig: Inject 29 Units into the skin at bedtime.   Semaglutide ,0.25 or 0.5MG /DOS, (OZEMPIC , 0.25 OR 0.5 MG/DOSE,) 2 MG/3ML SOPN 15 mL 0    Sig: Inject  0.25 mg into the skin once a week.   atorvastatin  (LIPITOR) 10 MG tablet 90 tablet 0    Sig: Take 1 tablet (10 mg total) by mouth daily.   mupirocin  ointment (BACTROBAN ) 2 % 30 g 2    Sig: Apply 1 Application topically 2 (two) times daily.   amoxicillin -clavulanate (AUGMENTIN ) 875-125 MG tablet 20 tablet 0    Sig: Take 1 tablet by mouth 2 (two) times daily.    Return in about 4 weeks (around 12/09/2024) for Follow-Up or next available chronic conditions.  Greig JINNY Chute, NP      [1]  Current Outpatient Medications on File Prior to Visit  Medication Sig Dispense Refill   acetaminophen  (TYLENOL ) 500 MG tablet Take 500 mg by mouth every 6 (six) hours as needed.     Continuous Glucose Sensor (FREESTYLE LIBRE 3 PLUS SENSOR) MISC Use to check blood sugar continuously throughout the day. Change sensors once every 14 days. E11.42 6 each 3   gabapentin  (NEURONTIN ) 300 MG capsule TAKE 1 CAPSULE(300 MG) BY MOUTH AT BEDTIME 90 capsule 0   Insulin  Pen Needle 32G X 4 MM MISC 1 each by Other route daily. Use to inject insulin  once daily. 100 each 12   TRUEplus Lancets 28G MISC Use to check blood sugar 2 times a day 100 each 4   DULoxetine  (CYMBALTA ) 20 MG capsule 1 capsule. (Patient not taking: Reported on 07/02/2024)  isosorbide mononitrate (IMDUR) 60 MG 24 hr tablet Take 60 mg by mouth daily. (Patient not taking: Reported on 07/02/2024)     [DISCONTINUED] bromocriptine  (PARLODEL ) 2.5 MG tablet 1/4 tab daily (Patient not taking: Reported on 03/12/2019) 25 tablet 3   No current facility-administered medications on file prior to visit.  [2] No Known Allergies  "

## 2024-11-11 NOTE — Progress Notes (Signed)
 4 month follow up, patient legs are swollen really tight

## 2024-11-12 ENCOUNTER — Ambulatory Visit: Payer: Self-pay | Admitting: Family Medicine

## 2024-11-12 LAB — BASIC METABOLIC PANEL WITH GFR
BUN/Creatinine Ratio: 14 (ref 9–20)
BUN: 18 mg/dL (ref 6–24)
CO2: 23 mmol/L (ref 20–29)
Calcium: 9 mg/dL (ref 8.7–10.2)
Chloride: 102 mmol/L (ref 96–106)
Creatinine, Ser: 1.25 mg/dL (ref 0.76–1.27)
Glucose: 243 mg/dL — ABNORMAL HIGH (ref 70–99)
Potassium: 5.1 mmol/L (ref 3.5–5.2)
Sodium: 139 mmol/L (ref 134–144)
eGFR: 73 mL/min/1.73

## 2024-11-12 LAB — HEMOGLOBIN A1C
Est. average glucose Bld gHb Est-mCnc: 303 mg/dL
Hgb A1c MFr Bld: 12.2 % — ABNORMAL HIGH (ref 4.8–5.6)

## 2024-11-17 ENCOUNTER — Ambulatory Visit (HOSPITAL_COMMUNITY)
Admission: RE | Admit: 2024-11-17 | Discharge: 2024-11-17 | Disposition: A | Discharge status: Home / Self Care | Source: Ambulatory Visit | Attending: Surgery | Admitting: Surgery

## 2024-11-17 DIAGNOSIS — R600 Localized edema: Secondary | ICD-10-CM | POA: Diagnosis not present

## 2024-11-18 ENCOUNTER — Ambulatory Visit (INDEPENDENT_AMBULATORY_CARE_PROVIDER_SITE_OTHER): Admitting: Orthopedic Surgery

## 2024-11-18 ENCOUNTER — Encounter: Payer: Self-pay | Admitting: Orthopedic Surgery

## 2024-11-18 DIAGNOSIS — L97911 Non-pressure chronic ulcer of unspecified part of right lower leg limited to breakdown of skin: Secondary | ICD-10-CM | POA: Diagnosis not present

## 2024-11-18 DIAGNOSIS — I890 Lymphedema, not elsewhere classified: Secondary | ICD-10-CM

## 2024-11-18 NOTE — Progress Notes (Addendum)
 "  Office Visit Note   Patient: Ian Hughes           Date of Birth: 01/16/1981           MRN: [id number] Visit Date: 11/18/2024              Requested by: [name] Jerona GAILS, MD [address] [place]  [address] [place],  [place] [address] PCP: Jaycee Greig PARAS, NP  Chief Complaint  Patient presents with   Right Leg - Pain      HPI: Discussed the use of AI scribe software for clinical note transcription with the patient, who gave verbal consent to proceed.  History of Present Illness Ian Hughes is a 43 year old male with bilateral lower extremity lymphedema who presents with progressive swelling and a non-healing right leg ulcer.  He reports progressive swelling and tightness involving both lower extremities, with greater severity in the right leg. He describes a sensation of generalized induration and tightness throughout both legs.  He denies erythema, warmth, or other signs of cellulitis. He has not experienced fevers, chills, sweats, chest pain, or shortness of breath. Recent lower extremity venous duplex ultrasound was negative for deep vein thrombosis.  He has a superficial ulcer on the right leg that has not healed, which he attributes to persistent edema. The wound is not associated with purulent drainage, erythema, or signs of infection. He notes the fluid in his legs is clear.  He has had significant weight gain but does not associate this with the onset of swelling. 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  Patient is seen for lymphedema bilateral lower extremities patient has stage II lymphedema with hyperkeratosis pain with venous leg ulcers.  Patient has undergone conservative therapy which includes elevation compression garment and compression wraps patient has used 3 layer compression including Profore wraps.  Patient has not tried basic pneumatic compression device.    Assessment & Plan: Visit Diagnoses:  1. Ulcer of right lower  extremity, limited to breakdown of skin (HCC)   2. Lymphedema     Plan: Assessment and Plan Assessment & Plan Bilateral lower extremity lymphedema with right leg ulcer Chronic lymphedema with firm induration in thighs and calves, complicated by a superficial right leg ulcer with healthy granulation tissue. No infection present. Sedentary work exacerbates edema. Venous duplex ultrasound negative for DVT. Persistent edema impairs skin perfusion and healing. - Applied multilayer compression wrap to right lower extremity. - Initiated measurements for lymphedema pumps for home use (one hour daily). - Advised leg elevation at home and at night with pillows. - Recommended muscle contraction exercises, including treadmill walking or stationary cycling. - Scheduled follow-up in one week for wrap change and reassessment.      Follow-Up Instructions: Return in about 1 week (around 11/25/2024).   Ortho Exam  Patient is alert, oriented, no adenopathy, well-dressed, normal affect, normal respiratory effort. Physical Exam EXTREMITIES: Firm induration in both thighs and calves. No cellulitis, tunneling, or signs of infection. Superficial wounds with healthy granulation tissue on right leg.   Most recent hemoglobin A1c 12.2.  Examination of the right lower extremity right ankle circumference 27 cm and left ankle circumference 27 cm.  Right calf circumference 46 cm and left calf circumference 47 cm.  Right knee circumference 48 cm and left knee 53 cm. Imaging: VAS US  LOWER EXTREMITY VENOUS (DVT) Result Date: 11/18/2024  Lower Venous DVT Study Patient Name:  Ian Hughes  Date  of Exam:   11/17/2024 Medical Rec #: [id number]       Accession #:    [id number] Date of Birth: May 20, 1981        Patient Gender: M Patient Age:   43 years Exam Location:  [address] Procedure:      VAS US  LOWER EXTREMITY VENOUS (DVT) Referring Phys: AMY MASSEY  --------------------------------------------------------------------------------  Indications: Edema.  Comparison Study: 01/30/24                   Bilateral lower extremity DVT exam was performed, the exam was                   negative. Performing Technologist: Dena Pane  Examination Guidelines: A complete evaluation includes B-mode imaging, spectral Doppler, color Doppler, and power Doppler as needed of all accessible portions of each vessel. Bilateral testing is considered an integral part of a complete examination. Limited examinations for reoccurring indications may be performed as noted. The reflux portion of the exam is performed with the patient in reverse Trendelenburg.  +---------+---------------+---------+-----------+----------+--------------+ RIGHT    CompressibilityPhasicitySpontaneityPropertiesThrombus Aging +---------+---------------+---------+-----------+----------+--------------+ CFV      Full           Yes      Yes                                 +---------+---------------+---------+-----------+----------+--------------+ SFJ      Full                                                        +---------+---------------+---------+-----------+----------+--------------+ FV Prox  Full           Yes      Yes                                 +---------+---------------+---------+-----------+----------+--------------+ FV Mid   Full                                                        +---------+---------------+---------+-----------+----------+--------------+ FV DistalFull                                                        +---------+---------------+---------+-----------+----------+--------------+ PFV      Full                                                        +---------+---------------+---------+-----------+----------+--------------+ POP      Full           Yes      Yes                                  +---------+---------------+---------+-----------+----------+--------------+  PTV      Full                                                        +---------+---------------+---------+-----------+----------+--------------+ PERO     Full                                                        +---------+---------------+---------+-----------+----------+--------------+ GSV      Full                                                        +---------+---------------+---------+-----------+----------+--------------+  +---------+---------------+---------+-----------+----------+--------------+ LEFT     CompressibilityPhasicitySpontaneityPropertiesThrombus Aging +---------+---------------+---------+-----------+----------+--------------+ CFV      Full           Yes      Yes                                 +---------+---------------+---------+-----------+----------+--------------+ SFJ      Full                                                        +---------+---------------+---------+-----------+----------+--------------+ FV Prox  Full           Yes      Yes                                 +---------+---------------+---------+-----------+----------+--------------+ FV Mid   Full                                                        +---------+---------------+---------+-----------+----------+--------------+ FV DistalFull                                                        +---------+---------------+---------+-----------+----------+--------------+ PFV      Full                                                        +---------+---------------+---------+-----------+----------+--------------+ POP      Full           Yes      Yes                                 +---------+---------------+---------+-----------+----------+--------------+  PTV      Full                                                         +---------+---------------+---------+-----------+----------+--------------+ PERO     Full                                                        +---------+---------------+---------+-----------+----------+--------------+ GSV      Full                                                        +---------+---------------+---------+-----------+----------+--------------+  Summary: BILATERAL: - No evidence of deep vein thrombosis seen in the lower extremities, bilaterally. - No evidence of superficial venous thrombosis in the lower extremities, bilaterally. -No evidence of popliteal cyst, bilaterally.   *See table(s) above for measurements and observations. Electronically signed by Fonda Rim on 11/18/2024 at 10:19:15 AM.    Final       Labs: Lab Results  Component Value Date   HGBA1C 12.2 (H) 11/11/2024   HGBA1C 9.4 (H) 09/10/2024   HGBA1C 8.8 (A) 07/02/2024   REPTSTATUS 02/25/2024 FINAL 02/20/2024   GRAMSTAIN  02/20/2024    FEW WBC PRESENT, PREDOMINANTLY PMN NO ORGANISMS SEEN    CULT  02/20/2024    FEW STAPHYLOCOCCUS AUREUS NO ANAEROBES ISOLATED Performed at [facility], [address], [place], [place] [address]    [facility] STAPHYLOCOCCUS AUREUS 02/20/2024     Lab Results  Component Value Date   ALBUMIN 3.3 (L) 02/14/2024   ALBUMIN 4.3 06/21/2020   ALBUMIN 4.3 12/12/2019    Lab Results  Component Value Date   MG 2.0 02/16/2024   Lab Results  Component Value Date   VD25OH 29 (L) 09/08/2014   VD25OH 36 08/21/2013   VD25OH 24 (L) 05/30/2012    No results found for: PREALBUMIN    Latest Ref Rng & Units 02/23/2024    5:19 AM 02/21/2024    6:24 AM 02/20/2024    5:45 AM  CBC EXTENDED  WBC 4.0 - 10.5 K/uL 10.7  11.4  8.1   RBC 4.22 - 5.81 MIL/uL 3.77  4.23  4.37   Hemoglobin 13.0 - 17.0 g/dL 89.8  88.3  88.0   HCT 39.0 - 52.0 % 31.1  34.7  35.3   Platelets 150 - 400 K/uL 370  403  400   NEUT# 1.7 - 7.7 K/uL   5.5   Lymph# 0.7 - 4.0 K/uL   1.4       There is no height or weight on file to calculate BMI.  Orders:  No orders of the defined types were placed in this encounter.  No orders of the defined types were placed in this encounter.    Procedures: No procedures performed  Clinical Data: No additional findings.  ROS:  All other systems negative, except as noted in the HPI. Review of Systems  Objective: Vital Signs: There were no vitals taken for this visit.  Specialty Comments:  No specialty comments available.  PMFS History: Patient Active Problem List   Diagnosis Date Noted   Infected abrasion of right leg 02/20/2024   Abscess of right lower leg 02/18/2024   Cellulitis of right lower leg 02/14/2024   AKI (acute kidney injury) 02/14/2024   Type 2 diabetes mellitus with diabetic polyneuropathy, with long-term current use of insulin  (HCC) 06/13/2022   Type 2 diabetes mellitus with hyperglycemia, with long-term current use of insulin  (HCC) 06/13/2022   Insulin  dependent type 2 diabetes mellitus (HCC) 02/11/2020   Glucosuria 02/11/2020   Non-restorative sleep 02/11/2020   Excessive daytime sleepiness 02/11/2020   Sleep related headaches 02/11/2020   Nocturia more than twice per night 02/11/2020   UARS (upper airway resistance syndrome) 04/07/2015   Severe obesity (BMI >= 40) (HCC) 04/07/2015   Primary snoring 04/07/2015   Snoring 12/21/2014   Drowsiness 12/21/2014   Witnessed apneic spells 12/21/2014   ED (erectile dysfunction) 09/24/2012   Type 2 diabetes mellitus with obesity 07/04/2009    Class: Chronic   OBESITY, NOS 01/17/2007   BELLS PALSY 01/17/2007   HYPERTENSION, BENIGN SYSTEMIC 01/17/2007   Past Medical History:  Diagnosis Date   Cataract 2011   surgery on R eye   Diabetes mellitus without complication (HCC)    High cholesterol    Obesity     Family History  Problem Relation Age of Onset   Diabetes Mother    Cancer Father        leukemia    Obesity Other     Past Surgical  History:  Procedure Laterality Date   EYE SURGERY Left 2011   cataract   EYE SURGERY Right 2012   INCISION AND DRAINAGE OF DEEP ABSCESS, CALF Right 02/20/2024   Procedure: INCISION AND DRAINAGE OF DEEP ABSCESS, CALF;  Surgeon: [name] Jerona GAILS, MD;  Location: [facility] OR;  Service: Orthopedics;  Laterality: Right;  RIGHT LEG DEBRIDEMENT   INCISION AND DRAINAGE OF WOUND Right 02/22/2024   Procedure: IRRIGATION AND DEBRIDEMENT WOUND;  Surgeon: [name] Jerona GAILS, MD;  Location[facility] OR;  Service: Orthopedics;  Laterality: Right;  IRRIGATION AND DEBRIDEMENT RIGHT LEG   Social History   Occupational History    Employer: [employer]  Tobacco Use   Smoking status: Never    Passive exposure: Never   Smokeless tobacco: Never  Vaping Use   Vaping status: Never Used  Substance and Sexual Activity   Alcohol use: Yes    Comment: 1 or 2 every 3 months    Drug use: No   Sexual activity: Not Currently         "

## 2024-11-25 ENCOUNTER — Ambulatory Visit: Admitting: Orthopedic Surgery

## 2024-11-25 ENCOUNTER — Ambulatory Visit: Admitting: Family

## 2024-11-26 ENCOUNTER — Encounter: Payer: Self-pay | Admitting: Family

## 2024-11-26 ENCOUNTER — Ambulatory Visit: Admitting: Family

## 2024-11-26 DIAGNOSIS — L03115 Cellulitis of right lower limb: Secondary | ICD-10-CM | POA: Diagnosis not present

## 2024-11-26 DIAGNOSIS — I890 Lymphedema, not elsewhere classified: Secondary | ICD-10-CM | POA: Diagnosis not present

## 2024-11-26 DIAGNOSIS — L97911 Non-pressure chronic ulcer of unspecified part of right lower leg limited to breakdown of skin: Secondary | ICD-10-CM | POA: Diagnosis not present

## 2024-11-26 NOTE — Progress Notes (Addendum)
 "  Office Visit Note   Patient: Ian Hughes           Date of Birth: 12-Jul-1981           MRN: [id number] Visit Date: 11/26/2024              Requested by: Jaycee Greig PARAS, NP [address] [place],  [place] [address] PCP: Jaycee Greig PARAS, NP  No chief complaint on file.     HPI: The patient is a 44 year old gentleman who presents in follow-up for nonhealing ulcers x 2 to the right lower extremity he has been in Dynaflex compression wrap for the last 1 week.  He tolerated this well and generally has no complaints about the wrap  He and his mom are pleased with the reduction in his edema  His symptoms have failed to improve despite elevation and compression garments 15 to 20 mmHg graduated compression as well as use of Dynaflex compression wraps  Assessment & Plan: Visit Diagnoses: No diagnosis found.  Plan: Will proceed with serial compression.  Dynaflex reapplied to the right lower extremity today.  He will plan to follow-up in 1 week for reevaluation  Follow-Up Instructions: No follow-ups on file.   Ortho Exam  Patient is alert, oriented, no adenopathy, well-dressed, normal affect, normal respiratory effort. On examination right lower extremity there continues to be edema which is some improved.  No erythema warmth or weeping the lateral ulcer today measures 8 cm x 3-1/2 cm without depth this is filled in with 50 % granulation.  The anterior ulcer is 3-1/2 cm x 2 cm this is filled in with 100% fibrinous exudative tissue   Addendum on 12/03/24: Lymphedema to bilateral lower extremities noted, stage II as hyperkeratosis as well as associated pain and venous leg ulcers are present. Measurements erroneously left out of note: The left circumferential measurements of the right ankle 27 cm calf 46 cm knee 48 cm left leg ankle 27 cm calf 47 cm knee 53 cm  Imaging: No results found. No images are attached to the encounter.  Labs: Lab Results  Component Value Date    HGBA1C 12.2 (H) 11/11/2024   HGBA1C 9.4 (H) 09/10/2024   HGBA1C 8.8 (A) 07/02/2024   REPTSTATUS 02/25/2024 FINAL 02/20/2024   GRAMSTAIN  02/20/2024    FEW WBC PRESENT, PREDOMINANTLY PMN NO ORGANISMS SEEN    CULT  02/20/2024    FEW STAPHYLOCOCCUS AUREUS NO ANAEROBES ISOLATED Performed at [facility], [address], [place], [place] [address]    [facility] STAPHYLOCOCCUS AUREUS 02/20/2024     Lab Results  Component Value Date   ALBUMIN 3.3 (L) 02/14/2024   ALBUMIN 4.3 06/21/2020   ALBUMIN 4.3 12/12/2019    Lab Results  Component Value Date   MG 2.0 02/16/2024   Lab Results  Component Value Date   VD25OH 29 (L) 09/08/2014   VD25OH 36 08/21/2013   VD25OH 24 (L) 05/30/2012    No results found for: PREALBUMIN    Latest Ref Rng & Units 02/23/2024    5:19 AM 02/21/2024    6:24 AM 02/20/2024    5:45 AM  CBC EXTENDED  WBC 4.0 - 10.5 K/uL 10.7  11.4  8.1   RBC 4.22 - 5.81 MIL/uL 3.77  4.23  4.37   Hemoglobin 13.0 - 17.0 g/dL 89.8  88.3  88.0   HCT 39.0 - 52.0 % 31.1  34.7  35.3   Platelets 150 - 400 K/uL 370  403  400   NEUT# 1.7 - 7.7 K/uL   5.5   Lymph# 0.7 - 4.0 K/uL   1.4      There is no height or weight on file to calculate BMI.  Orders:  No orders of the defined types were placed in this encounter.  No orders of the defined types were placed in this encounter.    Procedures: No procedures performed  Clinical Data: No additional findings.  ROS:  All other systems negative, except as noted in the HPI. Review of Systems  Objective: Vital Signs: There were no vitals taken for this visit.  Specialty Comments:  No specialty comments available.  PMFS History: Patient Active Problem List   Diagnosis Date Noted   Infected abrasion of right leg 02/20/2024   Abscess of right lower leg 02/18/2024   Cellulitis of right lower leg 02/14/2024   AKI (acute kidney injury) 02/14/2024   Type 2 diabetes mellitus with diabetic polyneuropathy, with long-term  current use of insulin  (HCC) 06/13/2022   Type 2 diabetes mellitus with hyperglycemia, with long-term current use of insulin  (HCC) 06/13/2022   Insulin  dependent type 2 diabetes mellitus (HCC) 02/11/2020   Glucosuria 02/11/2020   Non-restorative sleep 02/11/2020   Excessive daytime sleepiness 02/11/2020   Sleep related headaches 02/11/2020   Nocturia more than twice per night 02/11/2020   UARS (upper airway resistance syndrome) 04/07/2015   Severe obesity (BMI >= 40) (HCC) 04/07/2015   Primary snoring 04/07/2015   Snoring 12/21/2014   Drowsiness 12/21/2014   Witnessed apneic spells 12/21/2014   ED (erectile dysfunction) 09/24/2012   Type 2 diabetes mellitus with obesity 07/04/2009    Class: Chronic   OBESITY, NOS 01/17/2007   BELLS PALSY 01/17/2007   HYPERTENSION, BENIGN SYSTEMIC 01/17/2007   Past Medical History:  Diagnosis Date   Cataract 2011   surgery on R eye   Diabetes mellitus without complication (HCC)    High cholesterol    Obesity     Family History  Problem Relation Age of Onset   Diabetes Mother    Cancer Father        leukemia    Obesity Other     Past Surgical History:  Procedure Laterality Date   EYE SURGERY Left 2011   cataract   EYE SURGERY Right 2012   INCISION AND DRAINAGE OF DEEP ABSCESS, CALF Right 02/20/2024   Procedure: INCISION AND DRAINAGE OF DEEP ABSCESS, CALF;  Surgeon: [name] Jerona GAILS, MD;  Location: [facility] OR;  Service: Orthopedics;  Laterality: Right;  RIGHT LEG DEBRIDEMENT   INCISION AND DRAINAGE OF WOUND Right 02/22/2024   Procedure: IRRIGATION AND DEBRIDEMENT WOUND;  Surgeon: [name] Jerona GAILS, MD;  Location: [facility] OR;  Service: Orthopedics;  Laterality: Right;  IRRIGATION AND DEBRIDEMENT RIGHT LEG   Social History   Occupational History    Employer: [employer]  Tobacco Use   Smoking status: Never    Passive exposure: Never   Smokeless tobacco: Never  Vaping Use   Vaping status: Never Used  Substance and Sexual Activity   Alcohol use: Yes     Comment: 1 or 2 every 3 months    Drug use: No   Sexual activity: Not Currently       "

## 2024-11-28 ENCOUNTER — Encounter: Payer: Self-pay | Admitting: Orthopedic Surgery

## 2024-11-28 ENCOUNTER — Other Ambulatory Visit: Payer: Self-pay | Admitting: Family Medicine

## 2024-11-28 DIAGNOSIS — G629 Polyneuropathy, unspecified: Secondary | ICD-10-CM

## 2024-11-28 MED ORDER — TERBINAFINE HCL 250 MG PO TABS: 250.0000 mg | ORAL_TABLET | Freq: Every day | ORAL | 0 refills | Status: DC

## 2024-12-03 ENCOUNTER — Ambulatory Visit (INDEPENDENT_AMBULATORY_CARE_PROVIDER_SITE_OTHER): Admitting: Family

## 2024-12-03 ENCOUNTER — Encounter: Payer: Self-pay | Admitting: Family

## 2024-12-03 DIAGNOSIS — E1142 Type 2 diabetes mellitus with diabetic polyneuropathy: Secondary | ICD-10-CM

## 2024-12-03 DIAGNOSIS — L03115 Cellulitis of right lower limb: Secondary | ICD-10-CM

## 2024-12-03 DIAGNOSIS — Z794 Long term (current) use of insulin: Secondary | ICD-10-CM

## 2024-12-03 DIAGNOSIS — L02415 Cutaneous abscess of right lower limb: Secondary | ICD-10-CM

## 2024-12-03 NOTE — Progress Notes (Signed)
 "  Office Visit Note   Patient: Ian Hughes           Date of Birth: [birth year]           MRN: [id number] Visit Date: 12/03/2024              Requested by: Jaycee Greig PARAS, NP [address] [place],  [place] [address] PCP: Jaycee Greig PARAS, NP  Chief Complaint  Patient presents with   Right Leg - Wound Check      HPI: The patient is a 44 year old gentleman who presents in follow-up for ulcers x 2 to the right lower extremity following abscess and cellulitis right lower extremity and surgical intervention.  He has been in Dynaflex compression wrap for the last several weeks    Assessment & Plan: Visit Diagnoses: No diagnosis found.  Plan: Will offer a break from the Dynaflex he may shower may get this wet will cleanse with Vashe.  Do not soak or submerge.  He will use the vive compression garment to the right lower extremity with direct skin contact.  May alternate between the compression garment and Vashe to dry dressings with Ace for compression  We continue to work for lymphedema pump approval  Follow-Up Instructions: Return in about 8 days (around 12/11/2024).   Ortho Exam  Patient is alert, oriented, no adenopathy, well-dressed, normal affect, normal respiratory effort. On examination of right lower extremity there continues to be tight edema which is largely unchanged there is no erythema warmth or weeping the lateral ulcer today measures 6.4 cm x 3 cm with 1 mm of depth this is filled in with flat pink tissue.  The anterior ulcer is 3-1/2 cm x 2 cm filled in with 100% fibrinous exudative tissue there is no exposed bone there is no surrounding erythema or warmth    Imaging: No results found.    Labs: Lab Results  Component Value Date   HGBA1C 12.2 (H) 11/11/2024   HGBA1C 9.4 (H) 09/10/2024   HGBA1C 8.8 (A) 07/02/2024   REPTSTATUS 02/25/2024 FINAL 02/20/2024   GRAMSTAIN  02/20/2024    FEW WBC PRESENT, PREDOMINANTLY PMN NO ORGANISMS SEEN    CULT   02/20/2024    FEW STAPHYLOCOCCUS AUREUS NO ANAEROBES ISOLATED Performed at [facility], [address], [name], [place] [address]    [facility] STAPHYLOCOCCUS AUREUS 02/20/2024     Lab Results  Component Value Date   ALBUMIN 3.3 (L) 02/14/2024   ALBUMIN 4.3 06/21/2020   ALBUMIN 4.3 12/12/2019    Lab Results  Component Value Date   MG 2.0 02/16/2024   Lab Results  Component Value Date   VD25OH 29 (L) 09/08/2014   VD25OH 36 08/21/2013   VD25OH 24 (L) 05/30/2012    No results found for: PREALBUMIN    Latest Ref Rng & Units 02/23/2024    5:19 AM 02/21/2024    6:24 AM 02/20/2024    5:45 AM  CBC EXTENDED  WBC 4.0 - 10.5 K/uL 10.7  11.4  8.1   RBC 4.22 - 5.81 MIL/uL 3.77  4.23  4.37   Hemoglobin 13.0 - 17.0 g/dL 89.8  88.3  88.0   HCT 39.0 - 52.0 % 31.1  34.7  35.3   Platelets 150 - 400 K/uL 370  403  400   NEUT# 1.7 - 7.7 K/uL   5.5   Lymph# 0.7 - 4.0 K/uL   1.4      There is no height or  weight on file to calculate BMI.  Orders:  No orders of the defined types were placed in this encounter.  No orders of the defined types were placed in this encounter.    Procedures: No procedures performed  Clinical Data: No additional findings.  ROS:  All other systems negative, except as noted in the HPI. Review of Systems  Objective: Vital Signs: There were no vitals taken for this visit.  Specialty Comments:  No specialty comments available.  PMFS History: Patient Active Problem List   Diagnosis Date Noted   Infected abrasion of right leg 02/20/2024   Abscess of right lower leg 02/18/2024   Cellulitis of right lower leg 02/14/2024   AKI (acute kidney injury) 02/14/2024   Type 2 diabetes mellitus with diabetic polyneuropathy, with long-term current use of insulin  (HCC) 06/13/2022   Type 2 diabetes mellitus with hyperglycemia, with long-term current use of insulin  (HCC) 06/13/2022   Insulin  dependent type 2 diabetes mellitus (HCC) 02/11/2020   Glucosuria  02/11/2020   Non-restorative sleep 02/11/2020   Excessive daytime sleepiness 02/11/2020   Sleep related headaches 02/11/2020   Nocturia more than twice per night 02/11/2020   UARS (upper airway resistance syndrome) 04/07/2015   Severe obesity (BMI >= 40) (HCC) 04/07/2015   Primary snoring 04/07/2015   Snoring 12/21/2014   Drowsiness 12/21/2014   Witnessed apneic spells 12/21/2014   ED (erectile dysfunction) 09/24/2012   Type 2 diabetes mellitus with obesity 07/04/2009    Class: Chronic   OBESITY, NOS 01/17/2007   BELLS PALSY 01/17/2007   HYPERTENSION, BENIGN SYSTEMIC 01/17/2007   Past Medical History:  Diagnosis Date   Cataract 2011   surgery on R eye   Diabetes mellitus without complication (HCC)    High cholesterol    Obesity     Family History  Problem Relation Age of Onset   Diabetes Mother    Cancer Father        leukemia    Obesity Other     Past Surgical History:  Procedure Laterality Date   EYE SURGERY Left 2011   cataract   EYE SURGERY Right 2012   INCISION AND DRAINAGE OF DEEP ABSCESS, CALF Right 02/20/2024   Procedure: INCISION AND DRAINAGE OF DEEP ABSCESS, CALF;  Surgeon: [name] Jerona GAILS, MD;  Location: [facility] OR;  Service: Orthopedics;  Laterality: Right;  RIGHT LEG DEBRIDEMENT   INCISION AND DRAINAGE OF WOUND Right 02/22/2024   Procedure: IRRIGATION AND DEBRIDEMENT WOUND;  Surgeon: [name] Jerona GAILS, MD;  Location: [facility] OR;  Service: Orthopedics;  Laterality: Right;  IRRIGATION AND DEBRIDEMENT RIGHT LEG   Social History   Occupational History    Employer: [employer]  Tobacco Use   Smoking status: Never    Passive exposure: Never   Smokeless tobacco: Never  Vaping Use   Vaping status: Never Used  Substance and Sexual Activity   Alcohol use: Yes    Comment: 1 or 2 every 3 months    Drug use: No   Sexual activity: Not Currently       "

## 2024-12-05 NOTE — Telephone Encounter (Signed)
 Complete

## 2024-12-05 NOTE — Telephone Encounter (Signed)
 Gabapentin  prescribed (12/05/2024 10:43 AM EST).

## 2024-12-11 ENCOUNTER — Ambulatory Visit: Admitting: Orthopedic Surgery

## 2024-12-15 ENCOUNTER — Ambulatory Visit: Payer: Self-pay | Admitting: Family

## 2024-12-18 ENCOUNTER — Encounter: Payer: Self-pay | Admitting: Family

## 2024-12-18 ENCOUNTER — Other Ambulatory Visit: Payer: Self-pay | Admitting: Family Medicine

## 2024-12-18 ENCOUNTER — Other Ambulatory Visit: Payer: Self-pay

## 2024-12-19 NOTE — Telephone Encounter (Signed)
 Semaglutide  prescribed on (11/11/2024 10:15 AM EST) by for 90 day supply. Please send future refill requests to [facility] MD at established [facility].

## 2024-12-19 NOTE — Telephone Encounter (Signed)
 Patient established with [facility] for diabetes management. Recommendation to ask advisement from the same.

## 2024-12-21 ENCOUNTER — Other Ambulatory Visit: Payer: Self-pay | Admitting: Family

## 2024-12-25 ENCOUNTER — Ambulatory Visit: Admitting: Orthopedic Surgery

## 2024-12-25 ENCOUNTER — Encounter: Payer: Self-pay | Admitting: Orthopedic Surgery

## 2024-12-25 DIAGNOSIS — I890 Lymphedema, not elsewhere classified: Secondary | ICD-10-CM

## 2024-12-25 DIAGNOSIS — L97911 Non-pressure chronic ulcer of unspecified part of right lower leg limited to breakdown of skin: Secondary | ICD-10-CM

## 2024-12-25 DIAGNOSIS — E1142 Type 2 diabetes mellitus with diabetic polyneuropathy: Secondary | ICD-10-CM

## 2024-12-25 NOTE — Telephone Encounter (Signed)
 Called patient and left voicemail   He needs to call his endocrinologist for future refill

## 2024-12-25 NOTE — Progress Notes (Signed)
 "  Office Visit Note   Patient: Ian Hughes           Date of Birth: 1981-03-03           MRN: [id number] Visit Date: 12/25/2024              Requested by: Jaycee Greig PARAS, NP [address] [place],  [place] [address] PCP: Jaycee Greig PARAS, NP  Chief Complaint  Patient presents with   Right Leg - Follow-up   Left Leg - Follow-up      HPI: Discussed the use of AI scribe software for clinical note transcription with the patient, who gave verbal consent to proceed.  History of Present Illness Ian Hughes is a 44 year old male with chronic right lower extremity lymphedema and venous stasis ulcer who presents for follow-up to obtain lymphedema pumps.  He has persistent pain, swelling, and ulceration of the right lower extremity. Symptoms have been refractory to at least four weeks of conservative therapy, including medicated compression socks, multi-layer compression wraps, elevation, exercise, and massage. He currently uses knee-high [employer] compression socks and an ace bandage wrap.  He continues to experience chronic lymphedema. The wounds have been present for at least four weeks and have not resolved with conservative management.     Assessment & Plan: Visit Diagnoses:  1. Lymphedema   2. Type 2 diabetes mellitus with diabetic polyneuropathy, with long-term current use of insulin  (HCC)   3. Ulcer of right lower extremity, limited to breakdown of skin (HCC)     Plan: Assessment and Plan Assessment & Plan Chronic right lower extremity lymphedema with venous stasis ulcer Chronic, refractory lymphedema with persistent symptoms and non-healing ulcers despite conservative management. Healthy granulation tissue present. Repeat measurements confirm disease burden. Further intervention needed. - Obtained repeat limb measurements for documentation. - Documented symptoms and prior therapy for insurance approval of lymphedema pumps. - Planned submission of documentation  to insurance for lymphedema pumps. - Scheduled follow-up in four weeks.      Follow-Up Instructions: Return in about 4 weeks (around 01/22/2025).   Ortho Exam  Patient is alert, oriented, no adenopathy, well-dressed, normal affect, normal respiratory effort. Physical Exam EXTREMITIES: Persistent lymphedema with brawny skin color changes and papillomatous changes. Wounds with healthy granulation tissue. Anterior wound 3.5x2.5 cm, lateral wound 2x2 cm. Right ankle 28 cm, right calf 47 cm, right thigh 58 cm. Left ankle 25 cm, left calf 47 cm, left thigh 60 cm. MUSCULOSKELETAL: Slight antalgic gait on the right.      Imaging: No results found.    Labs: Lab Results  Component Value Date   HGBA1C 12.2 (H) 11/11/2024   HGBA1C 9.4 (H) 09/10/2024   HGBA1C 8.8 (A) 07/02/2024   REPTSTATUS 02/25/2024 FINAL 02/20/2024   GRAMSTAIN  02/20/2024    FEW WBC PRESENT, PREDOMINANTLY PMN NO ORGANISMS SEEN    CULT  02/20/2024    FEW STAPHYLOCOCCUS AUREUS NO ANAEROBES ISOLATED Performed at [facility], [address], [place], [place] [address]    [facility] STAPHYLOCOCCUS AUREUS 02/20/2024     Lab Results  Component Value Date   ALBUMIN 3.3 (L) 02/14/2024   ALBUMIN 4.3 06/21/2020   ALBUMIN 4.3 12/12/2019    Lab Results  Component Value Date   MG 2.0 02/16/2024   Lab Results  Component Value Date   VD25OH 29 (L) 09/08/2014   VD25OH 36 08/21/2013   VD25OH 24 (L) 05/30/2012    No results found for: PREALBUMIN  Latest Ref Rng & Units 02/23/2024    5:19 AM 02/21/2024    6:24 AM 02/20/2024    5:45 AM  CBC EXTENDED  WBC 4.0 - 10.5 K/uL 10.7  11.4  8.1   RBC 4.22 - 5.81 MIL/uL 3.77  4.23  4.37   Hemoglobin 13.0 - 17.0 g/dL 89.8  88.3  88.0   HCT 39.0 - 52.0 % 31.1  34.7  35.3   Platelets 150 - 400 K/uL 370  403  400   NEUT# 1.7 - 7.7 K/uL   5.5   Lymph# 0.7 - 4.0 K/uL   1.4      There is no height or weight on file to calculate BMI.  Orders:  No orders of the defined  types were placed in this encounter.  No orders of the defined types were placed in this encounter.    Procedures: No procedures performed  Clinical Data: No additional findings.  ROS:  All other systems negative, except as noted in the HPI. Review of Systems  Objective: Vital Signs: There were no vitals taken for this visit.  Specialty Comments:  No specialty comments available.  PMFS History: Patient Active Problem List   Diagnosis Date Noted   Infected abrasion of right leg 02/20/2024   Abscess of right lower leg 02/18/2024   Cellulitis of right lower leg 02/14/2024   AKI (acute kidney injury) 02/14/2024   Type 2 diabetes mellitus with diabetic polyneuropathy, with long-term current use of insulin  (HCC) 06/13/2022   Type 2 diabetes mellitus with hyperglycemia, with long-term current use of insulin  (HCC) 06/13/2022   Insulin  dependent type 2 diabetes mellitus (HCC) 02/11/2020   Glucosuria 02/11/2020   Non-restorative sleep 02/11/2020   Excessive daytime sleepiness 02/11/2020   Sleep related headaches 02/11/2020   Nocturia more than twice per night 02/11/2020   UARS (upper airway resistance syndrome) 04/07/2015   Severe obesity (BMI >= 40) (HCC) 04/07/2015   Primary snoring 04/07/2015   Snoring 12/21/2014   Drowsiness 12/21/2014   Witnessed apneic spells 12/21/2014   ED (erectile dysfunction) 09/24/2012   Type 2 diabetes mellitus with obesity 07/04/2009    Class: Chronic   OBESITY, NOS 01/17/2007   BELLS PALSY 01/17/2007   HYPERTENSION, BENIGN SYSTEMIC 01/17/2007   Past Medical History:  Diagnosis Date   Cataract 2011   surgery on R eye   Diabetes mellitus without complication (HCC)    High cholesterol    Obesity     Family History  Problem Relation Age of Onset   Diabetes Mother    Cancer Father        leukemia    Obesity Other     Past Surgical History:  Procedure Laterality Date   EYE SURGERY Left 2011   cataract   EYE SURGERY Right 2012    INCISION AND DRAINAGE OF DEEP ABSCESS, CALF Right 02/20/2024   Procedure: INCISION AND DRAINAGE OF DEEP ABSCESS, CALF;  Surgeon: [name] Jerona GAILS, MD;  Location: [facility] OR;  Service: Orthopedics;  Laterality: Right;  RIGHT LEG DEBRIDEMENT   INCISION AND DRAINAGE OF WOUND Right 02/22/2024   Procedure: IRRIGATION AND DEBRIDEMENT WOUND;  Surgeon: [name] Jerona GAILS, MD;  Location: [facility] OR;  Service: Orthopedics;  Laterality: Right;  IRRIGATION AND DEBRIDEMENT RIGHT LEG   Social History   Occupational History    Employer: [employer]  Tobacco Use   Smoking status: Never    Passive exposure: Never   Smokeless tobacco: Never  Vaping Use   Vaping status: Never  Used  Substance and Sexual Activity   Alcohol use: Yes    Comment: 1 or 2 every 3 months    Drug use: No   Sexual activity: Not Currently         "

## 2025-01-22 ENCOUNTER — Ambulatory Visit: Admitting: Orthopedic Surgery
